# Patient Record
Sex: Male | Born: 1980 | Race: Black or African American | Hispanic: No | Marital: Single | Smoking: Former smoker
Health system: Southern US, Community
[De-identification: ages and names within clinical notes are randomized; demographics above are authoritative.]

## PROBLEM LIST (undated history)

## (undated) DIAGNOSIS — Z8719 Personal history of other diseases of the digestive system: Secondary | ICD-10-CM

## (undated) DIAGNOSIS — K219 Gastro-esophageal reflux disease without esophagitis: Secondary | ICD-10-CM

## (undated) DIAGNOSIS — I10 Essential (primary) hypertension: Secondary | ICD-10-CM

## (undated) DIAGNOSIS — E119 Type 2 diabetes mellitus without complications: Secondary | ICD-10-CM

## (undated) HISTORY — DX: Personal history of other diseases of the digestive system: Z87.19

## (undated) HISTORY — PX: TOOTH EXTRACTION: SUR596

---

## 2010-07-19 ENCOUNTER — Emergency Department: Payer: Self-pay | Admitting: Emergency Medicine

## 2010-07-21 ENCOUNTER — Emergency Department: Payer: Self-pay | Admitting: Emergency Medicine

## 2010-07-28 ENCOUNTER — Emergency Department: Payer: Self-pay | Admitting: Unknown Physician Specialty

## 2011-10-31 ENCOUNTER — Emergency Department: Payer: Self-pay | Admitting: Emergency Medicine

## 2011-11-29 ENCOUNTER — Emergency Department: Payer: Self-pay | Admitting: Emergency Medicine

## 2015-08-15 ENCOUNTER — Emergency Department
Admission: EM | Admit: 2015-08-15 | Discharge: 2015-08-15 | Disposition: A | Discharge status: Home / Self Care | Payer: Medicare Other | Attending: Emergency Medicine | Admitting: Emergency Medicine

## 2015-08-15 ENCOUNTER — Encounter: Payer: Self-pay | Admitting: Emergency Medicine

## 2015-08-15 DIAGNOSIS — E1165 Type 2 diabetes mellitus with hyperglycemia: Secondary | ICD-10-CM | POA: Diagnosis not present

## 2015-08-15 DIAGNOSIS — F17200 Nicotine dependence, unspecified, uncomplicated: Secondary | ICD-10-CM | POA: Diagnosis not present

## 2015-08-15 DIAGNOSIS — R739 Hyperglycemia, unspecified: Secondary | ICD-10-CM

## 2015-08-15 DIAGNOSIS — N529 Male erectile dysfunction, unspecified: Secondary | ICD-10-CM | POA: Insufficient documentation

## 2015-08-15 DIAGNOSIS — N528 Other male erectile dysfunction: Secondary | ICD-10-CM

## 2015-08-15 HISTORY — DX: Essential (primary) hypertension: I10

## 2015-08-15 HISTORY — DX: Type 2 diabetes mellitus without complications: E11.9

## 2015-08-15 LAB — HEPATIC FUNCTION PANEL
ALBUMIN: 4.2 g/dL (ref 3.5–5.0)
ALK PHOS: 71 U/L (ref 38–126)
ALT: 24 U/L (ref 17–63)
AST: 20 U/L (ref 15–41)
BILIRUBIN TOTAL: 1 mg/dL (ref 0.3–1.2)
Bilirubin, Direct: <0.1 mg/dL — ABNORMAL LOW (ref 0.1–0.5)
TOTAL PROTEIN: 7.6 g/dL (ref 6.5–8.1)

## 2015-08-15 LAB — CBC
HEMATOCRIT: 46.2 % (ref 40.0–52.0)
HEMOGLOBIN: 15.4 g/dL (ref 13.0–18.0)
MCH: 28.3 pg (ref 26.0–34.0)
MCHC: 33.4 g/dL (ref 32.0–36.0)
MCV: 85 fL (ref 80.0–100.0)
Platelets: 162 10*3/uL (ref 150–440)
RBC: 5.44 MIL/uL (ref 4.40–5.90)
RDW: 13 % (ref 11.5–14.5)
WBC: 4.7 10*3/uL (ref 3.8–10.6)

## 2015-08-15 LAB — BASIC METABOLIC PANEL
ANION GAP: 6 (ref 5–15)
BUN: 11 mg/dL (ref 6–20)
CHLORIDE: 103 mmol/L (ref 101–111)
CO2: 24 mmol/L (ref 22–32)
Calcium: 9.2 mg/dL (ref 8.9–10.3)
Creatinine, Ser: 0.81 mg/dL (ref 0.61–1.24)
GFR calc Af Amer: >60 mL/min (ref >60)
Glucose, Bld: 272 mg/dL — ABNORMAL HIGH (ref 65–99)
POTASSIUM: 4 mmol/L (ref 3.5–5.1)
SODIUM: 133 mmol/L — AB (ref 135–145)

## 2015-08-15 LAB — GLUCOSE, CAPILLARY
GLUCOSE-CAPILLARY: 225 mg/dL — AB (ref 65–99)
Glucose-Capillary: 279 mg/dL — ABNORMAL HIGH (ref 65–99)

## 2015-08-15 MED ORDER — METFORMIN HCL 1000 MG PO TABS: 1000.0000 mg | ORAL_TABLET | Freq: Two times a day (BID) | ORAL | Status: DC

## 2015-08-15 NOTE — ED Provider Notes (Signed)
[facility] Emergency Department Provider Note   ____________________________________________  Time seen: Approximately 3:15 PM I have reviewed the triage vital signs and the triage nursing note.  HISTORY  Chief Complaint Hyperglycemia   Historian Patient  HPI Steve [name] is a 35 y.o. male with a history of hypertension and diabetes, who has been out of his metformin for what sounds like weeks to months although he is a bit of a poor historian. He is here because over the weekend he felt generalized weakness, decreased motivation, some headaches, some mild nausea which she thinks might indeed elevated blood sugar. He also looks to his girlfriend who gives me the additional history that he had trouble with an erection this weekend, which is what actually spurred him to come to the ED for evaluation.  Patient states when he was diagnosed with diabetes he was over 350 pounds and had significant weight loss, and then he was incarcerated and states that his nutrition was slightly better and so that his diabetes came under control. He's been out of diabetes medications recently, and would like to get started back on that.  He's not had any pain with urination, discharge, prior STD, or known STD exposure. No bowel pain. patient diarrhea.    Past Medical History  Diagnosis Date  . Diabetes mellitus without complication ([place])   . Hypertension     There are no active problems to display for this patient.   History reviewed. No pertinent past surgical history.  Current Outpatient Rx  Name  Route  Sig  Dispense  Refill  . metFORMIN (GLUCOPHAGE) 1000 MG tablet   Oral   Take 1 tablet (1,000 mg total) by mouth 2 (two) times daily with a meal.   60 tablet   1     Allergies Review of patient's allergies indicates no known allergies.  No family history on file.  Social History Social History  Substance Use Topics  . Smoking status: Current Every Day  Smoker  . Smokeless tobacco: None  . Alcohol Use: No    Review of Systems  Constitutional: Negative for fever. Eyes: Negative for visual changes. ENT: Negative for sore throat. Cardiovascular: Negative for chest pain. Respiratory: Negative for shortness of breath. Gastrointestinal: Negative for abdominal pain, vomiting and diarrhea. Genitourinary: Negative for dysuria. Musculoskeletal: Negative for back pain. Skin: Negative for rash. Neurological: Negative for headache at present 10 point Review of Systems otherwise negative ____________________________________________   PHYSICAL EXAM:  VITAL SIGNS: ED Triage Vitals  Enc Vitals Group     BP 08/15/15 1232 123/96 mmHg     Pulse Rate 08/15/15 1232 86     Resp 08/15/15 1232 18     Temp 08/15/15 1232 98.2 F (36.8 C)     Temp Source 08/15/15 1232 Oral     SpO2 08/15/15 1232 96 %     Weight 08/15/15 1232 265 lb (120.203 kg)     Height 08/15/15 1232 6\' 1"  (1.854 m)     Head Cir --      Peak Flow --      Pain Score --      Pain Loc --      Pain Edu? --      Excl. in [place]? --      Constitutional: Alert and oriented. Well appearing and in no distress.  HEENT   Head: Normocephalic and atraumatic.      Eyes: Conjunctivae are normal. PERRL. Normal extraocular movements.  Ears:         Nose: No congestion/rhinnorhea.   Mouth/Throat: Mucous membranes are moist.   Neck: No stridor. Cardiovascular/Chest: Normal rate, regular rhythm.  No murmurs, rubs, or gallops. Respiratory: Normal respiratory effort without tachypnea nor retractions. Breath sounds are clear and equal bilaterally. No wheezes/rales/rhonchi. Gastrointestinal: Soft. No distention, no guarding, no rebound. Nontender.    Genitourinary/rectal:Deferred Musculoskeletal: Nontender with normal range of motion in all extremities. No joint effusions.  No lower extremity tenderness.  No edema. Neurologic:  Normal speech and language. No gross or focal  neurologic deficits are appreciated. Skin:  Skin is warm, dry and intact. No rash noted. Psychiatric: Mood and affect are normal. Speech and behavior are normal. Patient exhibits appropriate insight and judgment.  ____________________________________________   EKG I, Lisa Roca, MD, the attending physician have personally viewed and interpreted all ECGs.  None ____________________________________________  LABS (pertinent positives/negatives)  Basic metabolic panel significant for glucose 272 and sodium 133 White blood count 4.7, hemoglobin 15.4 and platelet count 162 Hepatic function panel and hemoglobin A1c were added on, for primary care follow-up  ____________________________________________  RADIOLOGY All Xrays were viewed by me. Imaging interpreted by Radiologist.  None __________________________________________  PROCEDURES  Procedure(s) performed: None  Critical Care performed: None  ____________________________________________   ED COURSE / ASSESSMENT AND PLAN  Pertinent labs & imaging results that were available during my care of the patient were reviewed by me and considered in my medical decision making (see chart for details).    Patient has some vague generalized symptoms, but is well appearing and essentially without symptoms today. He is quite anxious about his erectile dysfunction, but I discussed with him that this is not necessarily something that we treat emergency department. He should follow back up with his primary care physician. We discussed that poorly controlled diabetic's me experience neuropathy contributing to the erectile dysfunction. I started back on his metformin. No evidence of DKA today.    CONSULTATIONS:   None   Patient / Family / Caregiver informed of clinical course, medical decision-making process, and agree with plan.   I discussed return precautions, follow-up instructions, and discharged instructions with patient and/or  family.   ___________________________________________   FINAL CLINICAL IMPRESSION(S) / ED DIAGNOSES   Final diagnoses:  Hyperglycemia  Other male erectile dysfunction              Note: This dictation was prepared with Dragon dictation. Any transcriptional errors that result from this process are unintentional   Lisa Roca, MD 08/15/15 [phone]

## 2015-08-15 NOTE — ED Notes (Signed)
States he thinks he blood sugar is elevated.  Feeling bad  Wt loss

## 2015-08-15 NOTE — Discharge Instructions (Signed)
Return to the Emergency Room for any worsening condition such as chest pain, weakness or numbness, trouble breathing, vomiting, confusion or altered mental status, or any other symptoms that are concerning to you.   Hyperglycemia Hyperglycemia occurs when the glucose (sugar) in your blood is too high. Hyperglycemia can happen for many reasons, but it most often happens to people who do not know they have diabetes or are not managing their diabetes properly.  CAUSES  Whether you have diabetes or not, there are other causes of hyperglycemia. Hyperglycemia can occur when you have diabetes, but it can also occur in other situations that you might not be as aware of, such as: Diabetes  If you have diabetes and are having problems controlling your blood glucose, hyperglycemia could occur because of some of the following reasons:  Not following your meal plan.  Not taking your diabetes medications or not taking it properly.  Exercising less or doing less activity than you normally do.  Being sick. Pre-diabetes  This cannot be ignored. Before people develop Type 2 diabetes, they almost always have "pre-diabetes." This is when your blood glucose levels are higher than normal, but not yet high enough to be diagnosed as diabetes. Research has shown that some long-term damage to the body, especially the heart and circulatory system, may already be occurring during pre-diabetes. If you take action to manage your blood glucose when you have pre-diabetes, you may delay or prevent Type 2 diabetes from developing. Stress  If you have diabetes, you may be "diet" controlled or on oral medications or insulin to control your diabetes. However, you may find that your blood glucose is higher than usual in the hospital whether you have diabetes or not. This is often referred to as "stress hyperglycemia." Stress can elevate your blood glucose. This happens because of hormones put out by the body during times of  stress. If stress has been the cause of your high blood glucose, it can be followed regularly by your caregiver. That way he/she can make sure your hyperglycemia does not continue to get worse or progress to diabetes. Steroids  Steroids are medications that act on the infection fighting system (immune system) to block inflammation or infection. One side effect can be a rise in blood glucose. Most people can produce enough extra insulin to allow for this rise, but for those who cannot, steroids make blood glucose levels go even higher. It is not unusual for steroid treatments to "uncover" diabetes that is developing. It is not always possible to determine if the hyperglycemia will go away after the steroids are stopped. A special blood test called an A1c is sometimes done to determine if your blood glucose was elevated before the steroids were started. SYMPTOMS  Thirsty.  Frequent urination.  Dry mouth.  Blurred vision.  Tired or fatigue.  Weakness.  Sleepy.  Tingling in feet or leg. DIAGNOSIS  Diagnosis is made by monitoring blood glucose in one or all of the following ways:  A1c test. This is a chemical found in your blood.  Fingerstick blood glucose monitoring.  Laboratory results. TREATMENT  First, knowing the cause of the hyperglycemia is important before the hyperglycemia can be treated. Treatment may include, but is not be limited to:  Education.  Change or adjustment in medications.  Change or adjustment in meal plan.  Treatment for an illness, infection, etc.  More frequent blood glucose monitoring.  Change in exercise plan.  Decreasing or stopping steroids.  Lifestyle changes. HOME CARE  INSTRUCTIONS   Test your blood glucose as directed.  Exercise regularly. Your caregiver will give you instructions about exercise. Pre-diabetes or diabetes which comes on with stress is helped by exercising.  Eat wholesome, balanced meals. Eat often and at regular, fixed  times. Your caregiver or nutritionist will give you a meal plan to guide your sugar intake.  Being at an ideal weight is important. If needed, losing as little as 10 to 15 pounds may help improve blood glucose levels. SEEK MEDICAL CARE IF:   You have questions about medicine, activity, or diet.  You continue to have symptoms (problems such as increased thirst, urination, or weight gain). SEEK IMMEDIATE MEDICAL CARE IF:   You are vomiting or have diarrhea.  Your breath smells fruity.  You are breathing faster or slower.  You are very sleepy or incoherent.  You have numbness, tingling, or pain in your feet or hands.  You have chest pain.  Your symptoms get worse even though you have been following your caregiver's orders.  If you have any other questions or concerns.   This information is not intended to replace advice given to you by your health care provider. Make sure you discuss any questions you have with your health care provider.   Document Released: 11/13/2000 Document Revised: 08/12/2011 Document Reviewed: 01/24/2015 Elsevier Interactive Patient Education 2016 Else[employer]  Erectile Dysfunction Erectile dysfunction is the inability to get or sustain a good enough erection to have sexual intercourse. Erectile dysfunction may involve:  Inability to get an erection.  Lack of enough hardness to allow penetration.  Loss of the erection before sex is finished.  Premature ejaculation. CAUSES  Certain drugs, such as:  Pain relievers.  Antihistamines.  Antidepressants.  Blood pressure medicines.  Water pills (diuretics).  Ulcer medicines.  Muscle relaxants.  Illegal drugs.  Excessive drinking.  Psychological causes, such as:  Anxiety.  Depression.  Sadness.  Exhaustion.  Performance fear.  Stress.  Physical causes, such as:  Artery problems. This may include diabetes, smoking, liver disease, or atherosclerosis.  High blood  pressure.  Hormonal problems, such as low testosterone.  Obesity.  Nerve problems. This may include back or pelvic injuries, diabetes mellitus, multiple sclerosis, or Parkinson disease. SYMPTOMS  Inability to get an erection.  Lack of enough hardness to allow penetration.  Loss of the erection before sex is finished.  Premature ejaculation.  Normal erections at some times, but with frequent unsatisfactory episodes.  Orgasms that are not satisfactory in sensation or frequency.  Low sexual satisfaction in either partner because of erection problems.  A curved penis occurring with erection. The curve may cause pain or may be too curved to allow for intercourse.  Never having nighttime erections. DIAGNOSIS Your caregiver can often diagnose this condition by:  Performing a physical exam to find other diseases or specific problems with the penis.  Asking you detailed questions about the problem.  Performing blood tests to check for diabetes mellitus or to measure hormone levels.  Performing urine tests to find other underlying health conditions.  Performing an ultrasound exam to check for scarring.  Performing a test to check blood flow to the penis.  Doing a sleep study at home to measure nighttime erections. TREATMENT   You may be prescribed medicines by mouth.  You may be given medicine injections into the penis.  You may be prescribed a vacuum pump with a ring.  Penile implant surgery may be performed. You may receive:  An inflatable implant.  A semirigid implant.  Blood vessel surgery may be performed. HOME CARE INSTRUCTIONS  If you are prescribed oral medicine, you should take the medicine as prescribed. Do not increase the dosage without first discussing it with your physician.  If you are using self-injections, be careful to avoid any veins that are on the surface of the penis. Apply pressure to the injection site for 5 minutes.  If you are using a  vacuum pump, make sure you have read the instructions before using it. Discuss any questions with your physician before taking the pump home. SEEK MEDICAL CARE IF:  You experience pain that is not responsive to the pain medicine you have been prescribed.  You experience nausea or vomiting. SEEK IMMEDIATE MEDICAL CARE IF:   When taking oral or injectable medications, you experience an erection that lasts longer than 4 hours. If your physician is unavailable, go to the nearest emergency room for evaluation. An erection that lasts much longer than 4 hours can result in permanent damage to your penis.  You have pain that is severe.  You develop redness, severe pain, or severe swelling of your penis.  You have redness spreading up into your groin or lower abdomen.  You are unable to pass your urine.   This information is not intended to replace advice given to you by your health care provider. Make sure you discuss any questions you have with your health care provider.   Document Released: 05/17/2000 Document Revised: 01/20/2013 Document Reviewed: 10/22/2012 Elsevier Interactive Patient Education [employer].

## 2015-08-16 LAB — HEMOGLOBIN A1C: HEMOGLOBIN A1C: 9.6 % — AB (ref 4.0–6.0)

## 2015-11-20 ENCOUNTER — Emergency Department
Admission: EM | Admit: 2015-11-20 | Discharge: 2015-11-20 | Disposition: A | Discharge status: Home / Self Care | Payer: Medicare Other | Attending: Emergency Medicine | Admitting: Emergency Medicine

## 2015-11-20 ENCOUNTER — Encounter: Payer: Self-pay | Admitting: Emergency Medicine

## 2015-11-20 ENCOUNTER — Emergency Department: Payer: Medicare Other

## 2015-11-20 DIAGNOSIS — E119 Type 2 diabetes mellitus without complications: Secondary | ICD-10-CM | POA: Diagnosis not present

## 2015-11-20 DIAGNOSIS — S199XXA Unspecified injury of neck, initial encounter: Secondary | ICD-10-CM | POA: Diagnosis present

## 2015-11-20 DIAGNOSIS — Y939 Activity, unspecified: Secondary | ICD-10-CM | POA: Diagnosis not present

## 2015-11-20 DIAGNOSIS — Z7984 Long term (current) use of oral hypoglycemic drugs: Secondary | ICD-10-CM | POA: Insufficient documentation

## 2015-11-20 DIAGNOSIS — F17200 Nicotine dependence, unspecified, uncomplicated: Secondary | ICD-10-CM | POA: Diagnosis not present

## 2015-11-20 DIAGNOSIS — S46912A Strain of unspecified muscle, fascia and tendon at shoulder and upper arm level, left arm, initial encounter: Secondary | ICD-10-CM | POA: Diagnosis not present

## 2015-11-20 DIAGNOSIS — F1290 Cannabis use, unspecified, uncomplicated: Secondary | ICD-10-CM | POA: Diagnosis not present

## 2015-11-20 DIAGNOSIS — S161XXA Strain of muscle, fascia and tendon at neck level, initial encounter: Secondary | ICD-10-CM | POA: Diagnosis not present

## 2015-11-20 DIAGNOSIS — Y999 Unspecified external cause status: Secondary | ICD-10-CM | POA: Insufficient documentation

## 2015-11-20 DIAGNOSIS — I10 Essential (primary) hypertension: Secondary | ICD-10-CM | POA: Insufficient documentation

## 2015-11-20 DIAGNOSIS — Y92410 Unspecified street and highway as the place of occurrence of the external cause: Secondary | ICD-10-CM | POA: Insufficient documentation

## 2015-11-20 DIAGNOSIS — S4992XA Unspecified injury of left shoulder and upper arm, initial encounter: Secondary | ICD-10-CM | POA: Diagnosis not present

## 2015-11-20 DIAGNOSIS — M25512 Pain in left shoulder: Secondary | ICD-10-CM | POA: Diagnosis not present

## 2015-11-20 MED ORDER — NAPROXEN 500 MG PO TBEC: 500.0000 mg | DELAYED_RELEASE_TABLET | Freq: Two times a day (BID) | ORAL | Status: DC

## 2015-11-20 MED ORDER — CYCLOBENZAPRINE HCL 5 MG PO TABS: 5.0000 mg | ORAL_TABLET | Freq: Three times a day (TID) | ORAL | Status: DC | PRN

## 2015-11-20 MED ORDER — HYDROCODONE-ACETAMINOPHEN 5-325 MG PO TABS: 1.0000 | ORAL_TABLET | Freq: Four times a day (QID) | ORAL | Status: DC | PRN

## 2015-11-20 MED ORDER — HYDROCODONE-ACETAMINOPHEN 5-325 MG PO TABS
1.0000 | ORAL_TABLET | Freq: Once | ORAL | Status: AC
  Administered 2015-11-20: 1 via ORAL
  Filled 2015-11-20: qty 1

## 2015-11-20 MED ORDER — CYCLOBENZAPRINE HCL 10 MG PO TABS
5.0000 mg | ORAL_TABLET | Freq: Once | ORAL | Status: AC
  Administered 2015-11-20: 5 mg via ORAL
  Filled 2015-11-20: qty 1
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
## 2015-11-21 DIAGNOSIS — F17210 Nicotine dependence, cigarettes, uncomplicated: Secondary | ICD-10-CM | POA: Diagnosis not present

## 2015-11-21 DIAGNOSIS — Z7984 Long term (current) use of oral hypoglycemic drugs: Secondary | ICD-10-CM | POA: Diagnosis not present

## 2015-11-21 DIAGNOSIS — E119 Type 2 diabetes mellitus without complications: Secondary | ICD-10-CM | POA: Diagnosis not present

## 2015-11-21 DIAGNOSIS — S060X0A Concussion without loss of consciousness, initial encounter: Secondary | ICD-10-CM | POA: Diagnosis not present

## 2015-11-21 DIAGNOSIS — S060X9A Concussion with loss of consciousness of unspecified duration, initial encounter: Secondary | ICD-10-CM | POA: Diagnosis not present

## 2015-11-21 DIAGNOSIS — S161XXA Strain of muscle, fascia and tendon at neck level, initial encounter: Secondary | ICD-10-CM | POA: Diagnosis not present

## 2015-11-23 DIAGNOSIS — S060X0A Concussion without loss of consciousness, initial encounter: Secondary | ICD-10-CM | POA: Diagnosis not present

## 2015-11-23 DIAGNOSIS — R51 Headache: Secondary | ICD-10-CM | POA: Diagnosis not present

## 2016-01-30 DIAGNOSIS — S139XXA Sprain of joints and ligaments of unspecified parts of neck, initial encounter: Secondary | ICD-10-CM | POA: Diagnosis not present

## 2016-01-30 DIAGNOSIS — S3991XA Unspecified injury of abdomen, initial encounter: Secondary | ICD-10-CM | POA: Diagnosis not present

## 2016-01-30 DIAGNOSIS — S238XXA Sprain of other specified parts of thorax, initial encounter: Secondary | ICD-10-CM | POA: Diagnosis not present

## 2016-01-30 DIAGNOSIS — K5900 Constipation, unspecified: Secondary | ICD-10-CM | POA: Diagnosis not present

## 2016-09-01 DIAGNOSIS — Z8719 Personal history of other diseases of the digestive system: Secondary | ICD-10-CM

## 2016-09-01 HISTORY — DX: Personal history of other diseases of the digestive system: Z87.19

## 2016-09-05 ENCOUNTER — Emergency Department
Admission: EM | Admit: 2016-09-05 | Discharge: 2016-09-05 | Disposition: A | Discharge status: Home / Self Care | Payer: Medicare Other | Attending: Emergency Medicine | Admitting: Emergency Medicine

## 2016-09-05 ENCOUNTER — Encounter: Payer: Self-pay | Admitting: Emergency Medicine

## 2016-09-05 DIAGNOSIS — K2970 Gastritis, unspecified, without bleeding: Secondary | ICD-10-CM | POA: Insufficient documentation

## 2016-09-05 DIAGNOSIS — I10 Essential (primary) hypertension: Secondary | ICD-10-CM | POA: Diagnosis not present

## 2016-09-05 DIAGNOSIS — F17210 Nicotine dependence, cigarettes, uncomplicated: Secondary | ICD-10-CM | POA: Insufficient documentation

## 2016-09-05 DIAGNOSIS — E119 Type 2 diabetes mellitus without complications: Secondary | ICD-10-CM | POA: Diagnosis not present

## 2016-09-05 DIAGNOSIS — K2900 Acute gastritis without bleeding: Secondary | ICD-10-CM | POA: Diagnosis not present

## 2016-09-05 DIAGNOSIS — R1013 Epigastric pain: Secondary | ICD-10-CM | POA: Diagnosis present

## 2016-09-05 DIAGNOSIS — Z7984 Long term (current) use of oral hypoglycemic drugs: Secondary | ICD-10-CM | POA: Diagnosis not present

## 2016-09-05 LAB — CBC
HCT: 45.1 % (ref 40.0–52.0)
Hemoglobin: 15.1 g/dL (ref 13.0–18.0)
MCH: 28.4 pg (ref 26.0–34.0)
MCHC: 33.5 g/dL (ref 32.0–36.0)
MCV: 84.9 fL (ref 80.0–100.0)
PLATELETS: 158 10*3/uL (ref 150–440)
RBC: 5.32 MIL/uL (ref 4.40–5.90)
RDW: 13 % (ref 11.5–14.5)
WBC: 4.5 10*3/uL (ref 3.8–10.6)

## 2016-09-05 LAB — COMPREHENSIVE METABOLIC PANEL
ALBUMIN: 4.2 g/dL (ref 3.5–5.0)
ALK PHOS: 63 U/L (ref 38–126)
ALT: 26 U/L (ref 17–63)
AST: 28 U/L (ref 15–41)
Anion gap: 9 (ref 5–15)
BUN: 10 mg/dL (ref 6–20)
CALCIUM: 9.4 mg/dL (ref 8.9–10.3)
CO2: 20 mmol/L — ABNORMAL LOW (ref 22–32)
CREATININE: 0.84 mg/dL (ref 0.61–1.24)
Chloride: 106 mmol/L (ref 101–111)
Glucose, Bld: 310 mg/dL — ABNORMAL HIGH (ref 65–99)
Potassium: 4.6 mmol/L (ref 3.5–5.1)
SODIUM: 135 mmol/L (ref 135–145)
Total Bilirubin: 1.3 mg/dL — ABNORMAL HIGH (ref 0.3–1.2)
Total Protein: 7.2 g/dL (ref 6.5–8.1)

## 2016-09-05 LAB — URINALYSIS, COMPLETE (UACMP) WITH MICROSCOPIC
Bacteria, UA: NONE SEEN
Bilirubin Urine: NEGATIVE
Hgb urine dipstick: NEGATIVE
KETONES UR: NEGATIVE mg/dL
LEUKOCYTES UA: NEGATIVE
Nitrite: NEGATIVE
PROTEIN: NEGATIVE mg/dL
Specific Gravity, Urine: 1.035 — ABNORMAL HIGH (ref 1.005–1.030)
Squamous Epithelial / LPF: NONE SEEN
WBC, UA: NONE SEEN WBC/hpf (ref 0–5)
pH: 6 (ref 5.0–8.0)

## 2016-09-05 LAB — LIPASE, BLOOD: Lipase: 82 U/L — ABNORMAL HIGH (ref 11–51)

## 2016-09-05 LAB — TROPONIN I: Troponin I: <0.03 ng/mL (ref <0.03)

## 2016-09-05 MED ORDER — GI COCKTAIL ~~LOC~~
30.0000 mL | Freq: Once | ORAL | Status: AC
  Administered 2016-09-05: 30 mL via ORAL
  Filled 2016-09-05: qty 30

## 2016-09-05 MED ORDER — PANTOPRAZOLE SODIUM 20 MG PO TBEC: 20.0000 mg | DELAYED_RELEASE_TABLET | Freq: Every day | ORAL | 1 refills | Status: DC

## 2016-09-05 NOTE — ED Notes (Signed)
Dr kinner at bedside. 

## 2016-09-05 NOTE — ED Triage Notes (Signed)
Pt c/o pain across upper entire abdomen, not worse on one side for past month intermittent. Denies NVD. Pain worse after eating. "really uncomfortable".

## 2016-09-05 NOTE — ED Provider Notes (Signed)
[facility] Emergency Department Provider Note   ____________________________________________    I have reviewed the triage vital signs and the nursing notes.   HISTORY  Chief Complaint Abdominal Pain     HPI Steve [name] is a 36 y.o. male who presents with complaints of burning intermittent abdominal pain, belching and discomfort over the last 2 months.he denies nausea or vomiting. He reports he has been taking Pepto-Bismol which helped significantly. He does not drink alcohol. He does smoke cigarettes. No history of gastritis or PUD in the past. Normal stools.   Past Medical History:  Diagnosis Date  . Diabetes mellitus without complication ([place])   . Hypertension     There are no active problems to display for this patient.   History reviewed. No pertinent surgical history.  Prior to Admission medications   Medication Sig Start Date End Date Taking? Authorizing Provider  metFORMIN (GLUCOPHAGE) 1000 MG tablet Take 1 tablet (1,000 mg total) by mouth 2 (two) times daily with a meal. 08/15/15  Yes Lisa Roca, MD  cyclobenzaprine (FLEXERIL) 5 MG tablet Take 1 tablet (5 mg total) by mouth 3 (three) times daily as needed for muscle spasms. Patient not taking: Reported on 09/05/2016 11/20/15   Dannielle Karvonen Menshew, PA-C  HYDROcodone-acetaminophen (NORCO) 5-325 MG tablet Take 1 tablet by mouth every 6 (six) hours as needed for moderate pain. Patient not taking: Reported on 09/05/2016 11/20/15   Dannielle Karvonen Menshew, PA-C  naproxen (EC NAPROSYN) 500 MG EC tablet Take 1 tablet (500 mg total) by mouth 2 (two) times daily with a meal. Patient not taking: Reported on 09/05/2016 11/20/15   Alita Chyle Bacon Menshew, PA-C  pantoprazole (PROTONIX) 20 MG tablet Take 1 tablet (20 mg total) by mouth daily. 09/05/16 09/05/17  Lavonia Drafts, MD     Allergies Patient has no known allergies.  History reviewed. No pertinent family history.  Social History Social  History  Substance Use Topics  . Smoking status: Current Every Day Smoker  . Smokeless tobacco: Never Used  . Alcohol use No    Review of Systems  Constitutional: No fever/chills   Cardiovascular: Denies chest pain. Respiratory: Denies shortness of breath. Gastrointestinal: as above Genitourinary: Negative for dysuria. Musculoskeletal: Negative for back pain. Skin: Negative for rash. Neurological: Negative for headaches  10-point ROS otherwise negative.  ____________________________________________   PHYSICAL EXAM:  VITAL SIGNS: ED Triage Vitals  Enc Vitals Group     BP 09/05/16 0915 106/79     Pulse Rate 09/05/16 0915 72     Resp 09/05/16 0915 20     Temp 09/05/16 0915 98 F (36.7 C)     Temp Source 09/05/16 0915 Oral     SpO2 09/05/16 0915 99 %     Weight 09/05/16 0916 260 lb (117.9 kg)     Height 09/05/16 0916 6\' 1"  (1.854 m)     Head Circumference --      Peak Flow --      Pain Score 09/05/16 0924 8     Pain Loc --      Pain Edu? --      Excl. in [place]? --     Constitutional: Alert and oriented. No acute distress. Pleasant and interactive. Patient with albinism Eyes: Conjunctivae are normal.    Mouth/Throat: Mucous membranes are moist.    Cardiovascular: Normal rate, regular rhythm. Grossly normal heart sounds.  Good peripheral circulation. Respiratory: Normal respiratory effort.  No retractions. Lungs CTAB. Gastrointestinal: no  abdominal tenderness to palpation. No distention.  No CVA tenderness. Genitourinary: deferred Musculoskeletal:   Warm and well perfused Neurologic:  Normal speech and language. No gross focal neurologic deficits are appreciated.  Skin:  Skin is warm, dry and intact. No rash noted. Psychiatric: Mood and affect are normal. Speech and behavior are normal.  ____________________________________________   LABS (all labs ordered are listed, but only abnormal results are displayed)  Labs Reviewed  LIPASE, BLOOD - Abnormal; Notable  for the following:       Result Value   Lipase 82 (*)    All other components within normal limits  COMPREHENSIVE METABOLIC PANEL - Abnormal; Notable for the following:    CO2 20 (*)    Glucose, Bld 310 (*)    Total Bilirubin 1.3 (*)    All other components within normal limits  URINALYSIS, COMPLETE (UACMP) WITH MICROSCOPIC - Abnormal; Notable for the following:    Color, Urine YELLOW (*)    APPearance CLEAR (*)    Specific Gravity, Urine 1.035 (*)    Glucose, UA >=500 (*)    All other components within normal limits  CBC  TROPONIN I   ____________________________________________  EKG  ED ECG REPORT I, Lavonia Drafts, the attending physician, personally viewed and interpreted this ECG.  Date: 09/05/2016 EKG Time: 9:30 AM Rate: 66 Rhythm: normal sinus rhythm QRS Axis: normal Intervals: normal ST/T Wave abnormalities: normal Conduction Disturbances: none Narrative Interpretation: unremarkable  ____________________________________________  RADIOLOGY  None ____________________________________________   PROCEDURES  Procedure(s) performed: No    Critical Care performed: No ____________________________________________   INITIAL IMPRESSION / ASSESSMENT AND PLAN / ED COURSE  Pertinent labs & imaging results that were available during my care of the patient were reviewed by me and considered in my medical decision making (see chart for details).  Patient with prolonged intermittent symptoms most consistent with gastritis/PUD. mild elevation of lipase however no tenderness to palpation. so do not suspect pancreatitis.No nausea/vomiting. We will treat with GI cocktail, start the patient on PPI and refer the patient to gastroenterology.    ____________________________________________   FINAL CLINICAL IMPRESSION(S) / ED DIAGNOSES  Final diagnoses:  Gastritis without bleeding, unspecified chronicity, unspecified gastritis type      NEW MEDICATIONS STARTED  DURING THIS VISIT:  Discharge Medication List as of 09/05/2016 11:38 AM    START taking these medications   Details  pantoprazole (PROTONIX) 20 MG tablet Take 1 tablet (20 mg total) by mouth daily., Starting Thu 09/05/2016, Until Fri 09/05/2017, Print         Note:  This document was prepared using Dragon voice recognition software and may include unintentional dictation errors.    Lavonia Drafts, MD 09/05/16 [phone]

## 2016-09-05 NOTE — ED Notes (Signed)
Called pharmacy for GI cocktial

## 2016-11-14 ENCOUNTER — Ambulatory Visit (INDEPENDENT_AMBULATORY_CARE_PROVIDER_SITE_OTHER): Payer: Medicare Other | Admitting: Family Medicine

## 2016-11-14 ENCOUNTER — Encounter: Payer: Self-pay | Admitting: Family Medicine

## 2016-11-14 VITALS — BP 131/85 | HR 80 | Temp 98.3°F | Ht 72.75 in | Wt 254.0 lb

## 2016-11-14 DIAGNOSIS — E1165 Type 2 diabetes mellitus with hyperglycemia: Secondary | ICD-10-CM

## 2016-11-14 DIAGNOSIS — Z7689 Persons encountering health services in other specified circumstances: Secondary | ICD-10-CM

## 2016-11-14 DIAGNOSIS — Z720 Tobacco use: Secondary | ICD-10-CM | POA: Insufficient documentation

## 2016-11-14 DIAGNOSIS — E119 Type 2 diabetes mellitus without complications: Secondary | ICD-10-CM | POA: Diagnosis not present

## 2016-11-14 DIAGNOSIS — R1013 Epigastric pain: Secondary | ICD-10-CM

## 2016-11-14 MED ORDER — PANTOPRAZOLE SODIUM 40 MG PO TBEC: 40.0000 mg | DELAYED_RELEASE_TABLET | Freq: Every day | ORAL | 3 refills | Status: DC

## 2016-11-14 MED ORDER — METFORMIN HCL 1000 MG PO TABS: 1000.0000 mg | ORAL_TABLET | Freq: Every day | ORAL | 3 refills | Status: DC

## 2016-11-14 NOTE — Progress Notes (Signed)
BP 131/85   Pulse 80   Temp 98.3 F (36.8 C)   Ht 6' 0.75" (1.848 m)   Wt 254 lb (115.2 kg)   SpO2 98%   BMI 33.74 kg/m    Subjective:    Patient ID: Steve [name], male    DOB: 04-20-1981, 36 y.o.   MRN: [id number]  HPI: Steve [name] is a 36 y.o. male  Chief Complaint  Patient presents with  . Establish Care  . Headache    x 1-2 weeks, Tylenol helps.   Steve [name]    was diagnosed with gastritis in April. Still has alot of gas.  . Diabetes    hasn't been checked in a while   Patient presents today to establish care. Has several concerns today.   Hx of DM, was prescribed 1000 mg metformin BID but states he has side effects (ED, GI upset, etc) when taking both doses so taking once daily. Has lost about 90 lb since diagnosis several years ago. Does still drink lots of sodas, but has improved his diet quite a bit. Does not check sugars at home. C/o frequent urination but no other sxs. No hypoglycemic episodes. Last A1C was 08/2015 and was 9.6 - this was off the metformin.   Was seen in ER 2 months ago for epigastric burning and bloating. Given protonix for gastritis but did not pick it up. Still having some sxs, but not quite as bad. Pepto bismol helps a lot. Admits to smoking at least a pack per day and drinking lots of sodas. No frequent NSAID use. No hematemesis, fever, vomiting, diarrhea, anorexia.   Also having almost 2 weeks of headaches which is out of the ordinary for him. When asked, does admit recent URI that has since resolved. Tylenol does help. No associated sxs with these headaches, including visual changes, confusion, weakness.   Past Medical History:  Diagnosis Date  . Diabetes mellitus without complication ([place])   . History of gastritis 09/2016  . Hypertension    Social History   Social History  . Marital status: Single    Spouse name: N/A  . Number of children: N/A  . Years of education: N/A   Occupational History  . Not on file.   Social History  Main Topics  . Smoking status: Current Every Day Smoker    Packs/day: 1.00    Types: Cigarettes  . Smokeless tobacco: Never Used  . Alcohol use No  . Drug use: Yes    Types: Marijuana  . Sexual activity: Not on file   Other Topics Concern  . Not on file   Social History Narrative  . No narrative on file   Relevant past medical, surgical, family and social history reviewed and updated as indicated. Interim medical history since our last visit reviewed. Allergies and medications reviewed and updated.  Review of Systems  Constitutional: Negative.   HENT: Negative.   Eyes: Negative.   Respiratory: Negative.   Cardiovascular: Negative.   Gastrointestinal: Positive for abdominal pain.  Genitourinary: Negative.   Musculoskeletal: Negative.   Neurological: Positive for headaches.  Psychiatric/Behavioral: Negative.    Per HPI unless specifically indicated above     Objective:    BP 131/85   Pulse 80   Temp 98.3 F (36.8 C)   Ht 6' 0.75" (1.848 m)   Wt 254 lb (115.2 kg)   SpO2 98%   BMI 33.74 kg/m   Wt Readings from Last 3 Encounters:  11/14/16 254 lb (115.2 kg)  09/05/16 260 lb (117.9 kg)  11/20/15 265 lb (120.2 kg)    Physical Exam  Constitutional: He is oriented to person, place, and time. He appears well-developed and well-nourished.  HENT:  Head: Atraumatic.  Eyes: Pupils are equal, round, and reactive to light.  Neck: Normal range of motion. Neck supple.  Cardiovascular: Normal rate and normal heart sounds.   Pulmonary/Chest: Effort normal and breath sounds normal. No respiratory distress.  Abdominal: Soft. Bowel sounds are normal. He exhibits no distension. There is no tenderness.  Musculoskeletal: Normal range of motion.  Neurological: He is alert and oriented to person, place, and time.  Skin: Skin is warm and dry. No rash noted.  Psychiatric: He has a normal mood and affect. His behavior is normal.  Nursing note and vitals reviewed.     Assessment &  Plan:   Problem List Items Addressed This Visit      Endocrine   Type 2 diabetes mellitus with hyperglycemia, without long-term current use of insulin ([place])    Await labs, continue metformin in meantime. Discussed lifestyle modifications - particularly cutting back or off of soft drinks.       Relevant Medications   metFORMIN (GLUCOPHAGE) 1000 MG tablet   Other Relevant Orders   Comprehensive metabolic panel (Completed)   HgB A1c (Completed)     Other   Tobacco use    Long discussion about cessation, pt looking to quit hopefully in near future but not ready just yet. Discussed free classes at [facility] and recommended starting with patches/gum and behavioral modifications to break the habitual aspects of smoking.        Other Visit Diagnoses    Encounter to establish care    -  Primary   Will set up a CPE when he comes back for 3 month DM f/u   Epigastric pain       Agree with ER eval regarding gastritis. Will check for H pylori today. Start protonix in meantime. Lifestyle modifications discussed.    Relevant Orders   Helicobacter pylori abs-IgG+IgA, bld (Completed)       Follow up plan: Return in about 3 months (around 02/14/2017) for A1C, CPE.

## 2016-11-16 DIAGNOSIS — E1165 Type 2 diabetes mellitus with hyperglycemia: Secondary | ICD-10-CM | POA: Insufficient documentation

## 2016-11-16 LAB — COMPREHENSIVE METABOLIC PANEL
ALBUMIN: 4.7 g/dL (ref 3.5–5.5)
ALT: 35 IU/L (ref 0–44)
AST: 15 IU/L (ref 0–40)
Albumin/Globulin Ratio: 1.8 (ref 1.2–2.2)
Alkaline Phosphatase: 85 IU/L (ref 39–117)
BUN / CREAT RATIO: 13 (ref 9–20)
BUN: 10 mg/dL (ref 6–20)
Bilirubin Total: 0.7 mg/dL (ref 0.0–1.2)
CALCIUM: 10 mg/dL (ref 8.7–10.2)
CO2: 19 mmol/L — ABNORMAL LOW (ref 20–29)
Chloride: 98 mmol/L (ref 96–106)
Creatinine, Ser: 0.8 mg/dL (ref 0.76–1.27)
GFR, EST AFRICAN AMERICAN: 134 mL/min/{1.73_m2} (ref >59)
GFR, EST NON AFRICAN AMERICAN: 116 mL/min/{1.73_m2} (ref >59)
GLUCOSE: 326 mg/dL — AB (ref 65–99)
Globulin, Total: 2.6 g/dL (ref 1.5–4.5)
Potassium: 4.4 mmol/L (ref 3.5–5.2)
Sodium: 137 mmol/L (ref 134–144)
TOTAL PROTEIN: 7.3 g/dL (ref 6.0–8.5)

## 2016-11-16 LAB — HELICOBACTER PYLORI ABS-IGG+IGA, BLD

## 2016-11-16 LAB — HEMOGLOBIN A1C
Est. average glucose Bld gHb Est-mCnc: 298 mg/dL
Hgb A1c MFr Bld: 12 % — ABNORMAL HIGH (ref 4.8–5.6)

## 2016-11-16 NOTE — Assessment & Plan Note (Signed)
Long discussion about cessation, pt looking to quit hopefully in near future but not ready just yet. Discussed free classes at [facility] and recommended starting with patches/gum and behavioral modifications to break the habitual aspects of smoking.

## 2016-11-16 NOTE — Assessment & Plan Note (Signed)
Await labs, continue metformin in meantime. Discussed lifestyle modifications - particularly cutting back or off of soft drinks.

## 2016-11-18 ENCOUNTER — Telehealth: Payer: Self-pay | Admitting: Family Medicine

## 2016-11-18 MED ORDER — CANAGLIFLOZIN 100 MG PO TABS: 100.0000 mg | ORAL_TABLET | Freq: Every day | ORAL | 2 refills | Status: DC

## 2016-11-18 NOTE — Telephone Encounter (Signed)
Patient notified

## 2016-11-18 NOTE — Telephone Encounter (Signed)
Please call pt and let him know that his A1C came back much higher than before at 12 and we want it below 7 so we will need to add some medicine. I am sending over a new medicine called invokana for him to start and we will recheck when he comes back in 3 months. Continue working on reducing sodas and simple carbs from diet.   Also H pylori came back negative so just continue protonix and let us know if no improvement

## 2017-02-10 ENCOUNTER — Encounter: Payer: Medicare Other | Admitting: Family Medicine

## 2017-02-10 DIAGNOSIS — I152 Hypertension secondary to endocrine disorders: Secondary | ICD-10-CM | POA: Insufficient documentation

## 2017-02-10 DIAGNOSIS — I10 Essential (primary) hypertension: Secondary | ICD-10-CM | POA: Insufficient documentation

## 2017-02-10 DIAGNOSIS — E1159 Type 2 diabetes mellitus with other circulatory complications: Secondary | ICD-10-CM | POA: Insufficient documentation

## 2017-02-12 ENCOUNTER — Encounter: Payer: Medicare Other | Admitting: Family Medicine

## 2017-11-19 LAB — HM DIABETES EYE EXAM

## 2017-12-11 ENCOUNTER — Encounter: Payer: Medicare Other | Admitting: Family Medicine

## 2017-12-11 ENCOUNTER — Ambulatory Visit: Payer: Medicare Other

## 2017-12-29 ENCOUNTER — Ambulatory Visit: Payer: Medicare Other

## 2018-02-19 ENCOUNTER — Encounter: Payer: Self-pay | Admitting: Family Medicine

## 2018-08-05 ENCOUNTER — Ambulatory Visit: Payer: Medicare Other

## 2018-08-05 ENCOUNTER — Ambulatory Visit: Payer: Medicare Other | Admitting: Family Medicine

## 2018-08-06 ENCOUNTER — Ambulatory Visit (INDEPENDENT_AMBULATORY_CARE_PROVIDER_SITE_OTHER): Payer: Medicare Other | Admitting: Family Medicine

## 2018-08-06 ENCOUNTER — Encounter: Payer: Self-pay | Admitting: Family Medicine

## 2018-08-06 ENCOUNTER — Other Ambulatory Visit: Payer: Self-pay

## 2018-08-06 VITALS — BP 121/78 | HR 82 | Temp 98.4°F | Ht 74.0 in | Wt 244.0 lb

## 2018-08-06 DIAGNOSIS — N529 Male erectile dysfunction, unspecified: Secondary | ICD-10-CM | POA: Diagnosis not present

## 2018-08-06 DIAGNOSIS — E1165 Type 2 diabetes mellitus with hyperglycemia: Secondary | ICD-10-CM | POA: Diagnosis not present

## 2018-08-06 DIAGNOSIS — K5900 Constipation, unspecified: Secondary | ICD-10-CM

## 2018-08-06 MED ORDER — SILDENAFIL CITRATE 100 MG PO TABS: 50.0000 mg | ORAL_TABLET | Freq: Every day | ORAL | 2 refills | Status: DC | PRN

## 2018-08-06 MED ORDER — DULAGLUTIDE 0.75 MG/0.5ML ~~LOC~~ SOAJ: 0.7500 mg | SUBCUTANEOUS | 3 refills | Status: DC

## 2018-08-06 NOTE — Progress Notes (Signed)
BP 121/78   Pulse 82   Temp 98.4 F (36.9 C) (Oral)   Ht 6\' 2"  (1.88 m)   Wt 244 lb (110.7 kg)   SpO2 99%   BMI 31.33 kg/m    Subjective:    Patient ID: Steve [name], male    DOB: 1980-08-11, 38 y.o.   MRN: [id number]  HPI: Steve [name] is a 38 y.o. male  Chief Complaint  Patient presents with  . Abdominal Pain    patient concerned about nort having a normal bowel movement, less than once aweek bowel movement   Here today with abdominal pain from constipation the past few months. Used to go about every day or every other day. Now going about once weekly and stools are hard. Taking several laxatives to help which does seem to help after about a day. Eats mostly fast food. Does not regularly take stool softeners and has never taken fiber or probiotics. No known hemorrhoids, bloody stools, nausea or vomiting.   Dealing with ED for about a year. Able to achieve and maintain erections, but states they are at about 70% each time. No major new stressors in that time frame. Does have a known hx of poorly controlled diabetes and a past history of HTN.   Has been lost to f/u for almost 2 years for treatment of his significant DM. Has not been on medication in that time. Last A1C in 2018 was at 12.0. States he changed his diet some and lost a few lb and assumed he had treated his diabetes adequately.   Relevant past medical, surgical, family and social history reviewed and updated as indicated. Interim medical history since our last visit reviewed. Allergies and medications reviewed and updated.  Review of Systems  Per HPI unless specifically indicated above     Objective:    BP 121/78   Pulse 82   Temp 98.4 F (36.9 C) (Oral)   Ht 6\' 2"  (1.88 m)   Wt 244 lb (110.7 kg)   SpO2 99%   BMI 31.33 kg/m   Wt Readings from Last 3 Encounters:  08/06/18 244 lb (110.7 kg)  11/14/16 254 lb (115.2 kg)  09/05/16 260 lb (117.9 kg)    Physical Exam Vitals signs and nursing note  reviewed.  Constitutional:      Appearance: Normal appearance.  HENT:     Head: Atraumatic.  Eyes:     Extraocular Movements: Extraocular movements intact.     Conjunctiva/sclera: Conjunctivae normal.  Neck:     Musculoskeletal: Normal range of motion and neck supple.  Cardiovascular:     Rate and Rhythm: Normal rate and regular rhythm.  Pulmonary:     Effort: Pulmonary effort is normal.     Breath sounds: Normal breath sounds.  Abdominal:     General: Bowel sounds are normal.     Palpations: Abdomen is soft.     Tenderness: There is no abdominal tenderness. There is no guarding or rebound.  Musculoskeletal: Normal range of motion.  Skin:    General: Skin is warm and dry.  Neurological:     General: No focal deficit present.     Mental Status: He is oriented to person, place, and time.  Psychiatric:        Mood and Affect: Mood normal.        Thought Content: Thought content normal.        Judgment: Judgment normal.    Results for orders placed or performed in visit on  08/06/18  HgB A1c  Result Value Ref Range   Hgb A1c MFr Bld 10.7 (H) 4.8 - 5.6 %   Est. average glucose Bld gHb Est-mCnc 260 mg/dL  Comprehensive metabolic panel  Result Value Ref Range   Glucose 356 (H) 65 - 99 mg/dL   BUN 8 6 - 20 mg/dL   Creatinine, Ser 0.63 (L) 0.76 - 1.27 mg/dL   GFR calc non Af Amer 126 >59 mL/min/1.73   GFR calc Af Amer 146 >59 mL/min/1.73   BUN/Creatinine Ratio 13 9 - 20   Sodium 138 134 - 144 mmol/L   Potassium 4.7 3.5 - 5.2 mmol/L   Chloride 102 96 - 106 mmol/L   CO2 18 (L) 20 - 29 mmol/L   Calcium 9.7 8.7 - 10.2 mg/dL   Total Protein 6.8 6.0 - 8.5 g/dL   Albumin 4.5 4.0 - 5.0 g/dL   Globulin, Total 2.3 1.5 - 4.5 g/dL   Albumin/Globulin Ratio 2.0 1.2 - 2.2   Bilirubin Total 0.6 0.0 - 1.2 mg/dL   Alkaline Phosphatase 96 39 - 117 IU/L   AST 10 0 - 40 IU/L   ALT 20 0 - 44 IU/L      Assessment & Plan:   Problem List Items Addressed This Visit      Endocrine   Type 2  diabetes mellitus with hyperglycemia, without long-term current use of insulin ([place]) - Primary    Discussed importance of compliance with medications and follow ups and long term effects of severely uncontrolled blood sugars. Will restart metformin, add trulicity and recheck W4M.       Relevant Medications   Dulaglutide (TRULICITY) 6.28 MN/8.1RR SOPN   Other Relevant Orders   HgB A1c (Completed)   Comprehensive metabolic panel (Completed)    Other Visit Diagnoses    Erectile dysfunction, unspecified erectile dysfunction type       Discussed likely related to uncontrolled DM. Viagra sent for prn use but discussed importance of good DM control    Constipation, unspecified constipation type       Start miralax prn, add daily fiber supplement and eat more fruits and veggies. F/u if not improving       Follow up plan: Return in about 3 months (around 11/06/2018) for DM.

## 2018-08-06 NOTE — Patient Instructions (Addendum)
Miralax for stool softening  Mucinex for chest congestion

## 2018-08-07 LAB — COMPREHENSIVE METABOLIC PANEL
ALK PHOS: 96 IU/L (ref 39–117)
ALT: 20 IU/L (ref 0–44)
AST: 10 IU/L (ref 0–40)
Albumin/Globulin Ratio: 2 (ref 1.2–2.2)
Albumin: 4.5 g/dL (ref 4.0–5.0)
BUN/Creatinine Ratio: 13 (ref 9–20)
BUN: 8 mg/dL (ref 6–20)
Bilirubin Total: 0.6 mg/dL (ref 0.0–1.2)
CO2: 18 mmol/L — ABNORMAL LOW (ref 20–29)
CREATININE: 0.63 mg/dL — AB (ref 0.76–1.27)
Calcium: 9.7 mg/dL (ref 8.7–10.2)
Chloride: 102 mmol/L (ref 96–106)
GFR calc Af Amer: 146 mL/min/{1.73_m2} (ref >59)
GFR calc non Af Amer: 126 mL/min/{1.73_m2} (ref >59)
GLOBULIN, TOTAL: 2.3 g/dL (ref 1.5–4.5)
GLUCOSE: 356 mg/dL — AB (ref 65–99)
Potassium: 4.7 mmol/L (ref 3.5–5.2)
SODIUM: 138 mmol/L (ref 134–144)
Total Protein: 6.8 g/dL (ref 6.0–8.5)

## 2018-08-07 LAB — HEMOGLOBIN A1C
ESTIMATED AVERAGE GLUCOSE: 260 mg/dL
HEMOGLOBIN A1C: 10.7 % — AB (ref 4.8–5.6)

## 2018-08-08 NOTE — Assessment & Plan Note (Signed)
Discussed importance of compliance with medications and follow ups and long term effects of severely uncontrolled blood sugars. Will restart metformin, add trulicity and recheck G8L.

## 2018-08-11 ENCOUNTER — Encounter: Payer: Self-pay | Admitting: Family Medicine

## 2018-08-13 ENCOUNTER — Ambulatory Visit: Payer: Medicare Other

## 2018-09-30 ENCOUNTER — Telehealth: Payer: Self-pay | Admitting: Family Medicine

## 2018-09-30 NOTE — Telephone Encounter (Signed)
Copied from [place] [id number]. Topic: Quick Communication - Rx Refill/Question >> Sep 30, 2018 11:10 AM Keene Breath wrote: Medication: metFORMIN (GLUCOPHAGE) 1000 MG tablet / sildenafil (VIAGRA) 100 MG tablet  Patient called to request a refill for the above medication  Preferred Pharmacy (with phone number or street name): [id number] - [place], [address] [phone] (Phone) [phone] (Fax)

## 2018-10-01 MED ORDER — SILDENAFIL CITRATE 100 MG PO TABS: 50.0000 mg | ORAL_TABLET | Freq: Every day | ORAL | 2 refills | Status: DC | PRN

## 2018-10-01 MED ORDER — METFORMIN HCL 1000 MG PO TABS: 1000.0000 mg | ORAL_TABLET | Freq: Every day | ORAL | 2 refills | Status: DC

## 2018-10-01 NOTE — Telephone Encounter (Signed)
Will send over refills. Please remind patient about his upcoming appt and let him know that he will not be allowed any further no-shows to the practice without being discharged due to amount of past no shows

## 2018-10-07 NOTE — Telephone Encounter (Signed)
Spoke with patient and advised that he will not be allowed any further no-shows. Patient apologized for his behavior and says he will provide a 24-hour notice if he must miss another appointment but will make every effort to come as scheduled. He says he is extremely grateful that you will still be seeing him for care.

## 2018-11-06 ENCOUNTER — Ambulatory Visit: Payer: Medicare Other | Admitting: Family Medicine

## 2018-11-12 ENCOUNTER — Ambulatory Visit (INDEPENDENT_AMBULATORY_CARE_PROVIDER_SITE_OTHER): Payer: Medicare Other | Admitting: Family Medicine

## 2018-11-12 ENCOUNTER — Encounter: Payer: Self-pay | Admitting: Family Medicine

## 2018-11-12 ENCOUNTER — Other Ambulatory Visit: Payer: Self-pay

## 2018-11-12 VITALS — BP 129/85 | HR 73 | Temp 97.7°F

## 2018-11-12 DIAGNOSIS — K219 Gastro-esophageal reflux disease without esophagitis: Secondary | ICD-10-CM

## 2018-11-12 DIAGNOSIS — E1165 Type 2 diabetes mellitus with hyperglycemia: Secondary | ICD-10-CM | POA: Diagnosis not present

## 2018-11-12 DIAGNOSIS — N529 Male erectile dysfunction, unspecified: Secondary | ICD-10-CM | POA: Diagnosis not present

## 2018-11-12 MED ORDER — SILDENAFIL CITRATE 100 MG PO TABS: 50.0000 mg | ORAL_TABLET | Freq: Every day | ORAL | 2 refills | Status: DC | PRN

## 2018-11-12 MED ORDER — PANTOPRAZOLE SODIUM 40 MG PO TBEC: 40.0000 mg | DELAYED_RELEASE_TABLET | Freq: Every day | ORAL | 3 refills | Status: DC

## 2018-11-12 MED ORDER — GLIPIZIDE 5 MG PO TABS: 5.0000 mg | ORAL_TABLET | Freq: Two times a day (BID) | ORAL | 3 refills | Status: DC

## 2018-11-12 NOTE — Progress Notes (Signed)
BP 129/85   Pulse 73   Temp 97.7 F (36.5 C) (Oral)   SpO2 97%    Subjective:    Patient ID: Steve [name], male    DOB: [birth year], 38 y.o.   MRN: [id number]  HPI: Steve [name] is a 38 y.o. male  Chief Complaint  Patient presents with  . Diabetes    pt states he has not started Trulicity due to cost   Here today for 3 month DM f/u. Was unable to start trulicity due to insurance coverage and sporadically takes the metformin as it seems to cause him erectile issues and diarrhea. Does feel he's been working hard on improving his diet but still needs to make more changes. Denies low blood sugar spells. States the viagra does help with the ED that he has intermittently.   Having upper abdominal pain, bloating, belching, and reflux burning sxs the past few months as well. Pepto bismol or pepcid does seem to help quickly after taking them each time. Denies melena, hematemesis, weight loss, fevers.   Relevant past medical, surgical, family and social history reviewed and updated as indicated. Interim medical history since our last visit reviewed. Allergies and medications reviewed and updated.  Review of Systems  Per HPI unless specifically indicated above     Objective:    BP 129/85   Pulse 73   Temp 97.7 F (36.5 C) (Oral)   SpO2 97%   Wt Readings from Last 3 Encounters:  08/06/18 244 lb (110.7 kg)  11/14/16 254 lb (115.2 kg)  09/05/16 260 lb (117.9 kg)    Physical Exam Vitals signs and nursing note reviewed.  Constitutional:      Appearance: Normal appearance.  HENT:     Head: Atraumatic.  Eyes:     Extraocular Movements: Extraocular movements intact.     Conjunctiva/sclera: Conjunctivae normal.  Neck:     Musculoskeletal: Normal range of motion and neck supple.  Cardiovascular:     Rate and Rhythm: Normal rate and regular rhythm.  Pulmonary:     Effort: Pulmonary effort is normal.     Breath sounds: Normal breath sounds.  Abdominal:     General: Bowel  sounds are normal. There is no distension.     Palpations: Abdomen is soft.     Tenderness: There is no abdominal tenderness. There is no right CVA tenderness, left CVA tenderness or guarding.  Musculoskeletal: Normal range of motion.  Skin:    General: Skin is warm and dry.  Neurological:     General: No focal deficit present.     Mental Status: He is oriented to person, place, and time.  Psychiatric:        Mood and Affect: Mood normal.        Thought Content: Thought content normal.        Judgment: Judgment normal.     Results for orders placed or performed in visit on 11/12/18  Comprehensive metabolic panel  Result Value Ref Range   Glucose 271 (H) 65 - 99 mg/dL   BUN 8 6 - 20 mg/dL   Creatinine, Ser 0.73 (L) 0.76 - 1.27 mg/dL   GFR calc non Af Amer 118 >59 mL/min/1.73   GFR calc Af Amer 137 >59 mL/min/1.73   BUN/Creatinine Ratio 11 9 - 20   Sodium 135 134 - 144 mmol/L   Potassium 4.0 3.5 - 5.2 mmol/L   Chloride 100 96 - 106 mmol/L   CO2 19 (L) 20 - 29 mmol/L  Calcium 9.3 8.7 - 10.2 mg/dL   Total Protein 6.7 6.0 - 8.5 g/dL   Albumin 4.1 4.0 - 5.0 g/dL   Globulin, Total 2.6 1.5 - 4.5 g/dL   Albumin/Globulin Ratio 1.6 1.2 - 2.2   Bilirubin Total 0.4 0.0 - 1.2 mg/dL   Alkaline Phosphatase 87 39 - 117 IU/L   AST 11 0 - 40 IU/L   ALT 18 0 - 44 IU/L  HgB A1c  Result Value Ref Range   Hgb A1c MFr Bld 10.9 (H) 4.8 - 5.6 %   Est. average glucose Bld gHb Est-mCnc 266 mg/dL      Assessment & Plan:   Problem List Items Addressed This Visit      Digestive   GERD (gastroesophageal reflux disease)    Will start protonix, discussed dietary modifications      Relevant Medications   pantoprazole (PROTONIX) 40 MG tablet     Endocrine   Type 2 diabetes mellitus with hyperglycemia, without long-term current use of insulin (HCC) - Primary    Recheck A1C, coupon given for trulicity and will switch to what is covered if still not covered. D/c metformin due to side effects and  start glipizide. Continue working on lifestyle choices      Relevant Medications   glipiZIDE (GLUCOTROL) 5 MG tablet   Other Relevant Orders   Comprehensive metabolic panel (Completed)   HgB A1c (Completed)    Other Visit Diagnoses    Erectile dysfunction, unspecified erectile dysfunction type       Stable on prn viagra. Again discussed that better BS control may help with this issue       Follow up plan: Return in about 3 months (around 02/12/2019) for A1C.

## 2018-11-13 LAB — COMPREHENSIVE METABOLIC PANEL
ALT: 18 IU/L (ref 0–44)
AST: 11 IU/L (ref 0–40)
Albumin/Globulin Ratio: 1.6 (ref 1.2–2.2)
Albumin: 4.1 g/dL (ref 4.0–5.0)
Alkaline Phosphatase: 87 IU/L (ref 39–117)
BUN/Creatinine Ratio: 11 (ref 9–20)
BUN: 8 mg/dL (ref 6–20)
Bilirubin Total: 0.4 mg/dL (ref 0.0–1.2)
CO2: 19 mmol/L — ABNORMAL LOW (ref 20–29)
Calcium: 9.3 mg/dL (ref 8.7–10.2)
Chloride: 100 mmol/L (ref 96–106)
Creatinine, Ser: 0.73 mg/dL — ABNORMAL LOW (ref 0.76–1.27)
GFR calc Af Amer: 137 mL/min/{1.73_m2} (ref >59)
GFR calc non Af Amer: 118 mL/min/{1.73_m2} (ref >59)
Globulin, Total: 2.6 g/dL (ref 1.5–4.5)
Glucose: 271 mg/dL — ABNORMAL HIGH (ref 65–99)
Potassium: 4 mmol/L (ref 3.5–5.2)
Sodium: 135 mmol/L (ref 134–144)
Total Protein: 6.7 g/dL (ref 6.0–8.5)

## 2018-11-13 LAB — HEMOGLOBIN A1C
Est. average glucose Bld gHb Est-mCnc: 266 mg/dL
Hgb A1c MFr Bld: 10.9 % — ABNORMAL HIGH (ref 4.8–5.6)

## 2018-11-17 DIAGNOSIS — K219 Gastro-esophageal reflux disease without esophagitis: Secondary | ICD-10-CM | POA: Insufficient documentation

## 2018-11-17 NOTE — Assessment & Plan Note (Signed)
Will start protonix, discussed dietary modifications

## 2018-11-17 NOTE — Assessment & Plan Note (Signed)
Recheck A1C, coupon given for trulicity and will switch to what is covered if still not covered. D/c metformin due to side effects and start glipizide. Continue working on lifestyle choices

## 2019-02-16 ENCOUNTER — Ambulatory Visit: Payer: Medicare Other | Admitting: Family Medicine

## 2019-02-17 ENCOUNTER — Ambulatory Visit (INDEPENDENT_AMBULATORY_CARE_PROVIDER_SITE_OTHER): Payer: Medicare Other | Admitting: Family Medicine

## 2019-02-17 ENCOUNTER — Other Ambulatory Visit: Payer: Self-pay

## 2019-02-17 ENCOUNTER — Encounter: Payer: Self-pay | Admitting: Family Medicine

## 2019-02-17 VITALS — BP 128/78 | HR 73 | Temp 98.4°F | Ht 73.0 in | Wt 242.0 lb

## 2019-02-17 DIAGNOSIS — K219 Gastro-esophageal reflux disease without esophagitis: Secondary | ICD-10-CM | POA: Diagnosis not present

## 2019-02-17 DIAGNOSIS — R1013 Epigastric pain: Secondary | ICD-10-CM | POA: Diagnosis not present

## 2019-02-17 DIAGNOSIS — N529 Male erectile dysfunction, unspecified: Secondary | ICD-10-CM | POA: Diagnosis not present

## 2019-02-17 DIAGNOSIS — E1165 Type 2 diabetes mellitus with hyperglycemia: Secondary | ICD-10-CM

## 2019-02-17 MED ORDER — DEXILANT 60 MG PO CPDR: 60.0000 mg | DELAYED_RELEASE_CAPSULE | Freq: Every day | ORAL | 2 refills | Status: DC

## 2019-02-17 MED ORDER — SUCRALFATE 1 G PO TABS: 1.0000 g | ORAL_TABLET | Freq: Three times a day (TID) | ORAL | 0 refills | Status: DC

## 2019-02-17 MED ORDER — METOCLOPRAMIDE HCL 5 MG PO TABS: 5.0000 mg | ORAL_TABLET | Freq: Four times a day (QID) | ORAL | 0 refills | Status: DC

## 2019-02-17 NOTE — Progress Notes (Signed)
BP 128/78 (BP Location: Right Arm, Patient Position: Sitting, Cuff Size: Normal)   Pulse 73   Temp 98.4 F (36.9 C) (Oral)   Ht 6\' 1"  (1.854 m)   Wt 242 lb (109.8 kg)   SpO2 99%   BMI 31.93 kg/m    Subjective:    Patient ID: Steve [name], male    DOB: [birth year], 38 y.o.   MRN: [id number]  HPI: Steve [name] is a 38 y.o. male  Chief Complaint  Patient presents with  . Diabetes  . Abdominal Pain    Patient still having onging stomach pain. Patient states Protonix did not work.   Still having acid reflux, belching, bloating, and epigastric pain the past 4 or so months despite daily use of protonix and diet changes. Pepto bismol is the only thing that seems to help. Does also note some constipation. Now having BMs only about 1/week when he's typically been able to have a BM every day. Denies vomiting, melena, fevers, chills.   DM - doing very well on the trulicity, states he feels like his sugars are doing much better since making this addition. No longer having the urinary frequency or sexual dysfunction he used to have. Taking the glipizide without issue but does not he doesn't frequently remember his second dose daily. Denies low blood sugar spells. Trying to make good dietary changes.   Relevant past medical, surgical, family and social history reviewed and updated as indicated. Interim medical history since our last visit reviewed. Allergies and medications reviewed and updated.  Review of Systems  Per HPI unless specifically indicated above     Objective:    BP 128/78 (BP Location: Right Arm, Patient Position: Sitting, Cuff Size: Normal)   Pulse 73   Temp 98.4 F (36.9 C) (Oral)   Ht 6\' 1"  (1.854 m)   Wt 242 lb (109.8 kg)   SpO2 99%   BMI 31.93 kg/m   Wt Readings from Last 3 Encounters:  02/17/19 242 lb (109.8 kg)  08/06/18 244 lb (110.7 kg)  11/14/16 254 lb (115.2 kg)    Physical Exam Vitals signs and nursing note reviewed.  Constitutional:    Appearance: Normal appearance.  HENT:     Head: Atraumatic.  Eyes:     Extraocular Movements: Extraocular movements intact.     Conjunctiva/sclera: Conjunctivae normal.  Neck:     Musculoskeletal: Normal range of motion and neck supple.  Cardiovascular:     Rate and Rhythm: Normal rate and regular rhythm.  Pulmonary:     Effort: Pulmonary effort is normal.     Breath sounds: Normal breath sounds.  Abdominal:     General: Bowel sounds are normal. There is no distension.     Palpations: Abdomen is soft.     Tenderness: There is no abdominal tenderness. There is no right CVA tenderness, left CVA tenderness or guarding.  Musculoskeletal: Normal range of motion.  Skin:    General: Skin is warm and dry.  Neurological:     General: No focal deficit present.     Mental Status: He is oriented to person, place, and time.  Psychiatric:        Mood and Affect: Mood normal.        Thought Content: Thought content normal.        Judgment: Judgment normal.     Results for orders placed or performed in visit on [id number]  Basic metabolic panel  Result Value Ref Range   Glucose 153 (H) 65 -  99 mg/dL   BUN 7 6 - 20 mg/dL   Creatinine, Ser 0.86 0.76 - 1.27 mg/dL   GFR calc non Af Amer 111 >59 mL/min/1.73   GFR calc Af Amer 128 >59 mL/min/1.73   BUN/Creatinine Ratio 8 (L) 9 - 20   Sodium 140 134 - 144 mmol/L   Potassium 4.4 3.5 - 5.2 mmol/L   Chloride 107 (H) 96 - 106 mmol/L   CO2 20 20 - 29 mmol/L   Calcium 9.7 8.7 - 10.2 mg/dL  HgB A1c  Result Value Ref Range   Hgb A1c MFr Bld 6.2 (H) 4.8 - 5.6 %   Est. average glucose Bld gHb Est-mCnc A999333 mg/dL  Helicobacter pylori abs-IgG+IgA, bld  Result Value Ref Range   H. pylori, IgA Abs <9.0 0.0 - 8.9 units   H. pylori, IgG AbS 0.20 0.00 - 0.79 Index Value      Assessment & Plan:   Problem List Items Addressed This Visit      Digestive   GERD (gastroesophageal reflux disease)    Will switch to dexilant, add carafate prn and try reglan  in case this is more gastroparesis rather than his gastritis flaring up. Per his request, referral placed to GI for further eval. Will repeat H pylori testing though neg in 2018 given worsening nature of sxs.       Relevant Medications   sucralfate (CARAFATE) 1 g tablet   dexlansoprazole (DEXILANT) 60 MG capsule   metoCLOPramide (REGLAN) 5 MG tablet   Other Relevant Orders   Helicobacter pylori abs-IgG+IgA, bld (Completed)   Ambulatory referral to Gastroenterology     Endocrine   Type 2 diabetes mellitus with hyperglycemia, without long-term current use of insulin ([place]) - Primary    Feels improved since adding trulicity. Will check A1C, if still not to goal will switch glipizide to XR formulation to give him max benefit on the medication as he often forgets his second dose of the day. Continue good lifestyle changes      Relevant Orders   Basic metabolic panel (Completed)   HgB A1c (Completed)    Other Visit Diagnoses    Epigastric pain       Refractory GERD/gastritis vs possible gastroparesis from long term uncontrolled DM. Switch to dexilant, prn carafate, trial reglan. Refer to GI per his request   Relevant Orders   Ambulatory referral to Gastroenterology   Erectile dysfunction, unspecified erectile dysfunction type       Resolved per patient without need for viagra prn anymore. Will keep on list for now in case returning sxs        Follow up plan: Return in about 3 months (around 05/19/2019) for DM.

## 2019-02-17 NOTE — Assessment & Plan Note (Signed)
Will switch to dexilant, add carafate prn and try reglan in case this is more gastroparesis rather than his gastritis flaring up. Per his request, referral placed to GI for further eval. Will repeat H pylori testing though neg in 2018 given worsening nature of sxs.

## 2019-02-18 LAB — HEMOGLOBIN A1C
Est. average glucose Bld gHb Est-mCnc: 131 mg/dL
Hgb A1c MFr Bld: 6.2 % — ABNORMAL HIGH (ref 4.8–5.6)

## 2019-02-19 ENCOUNTER — Encounter: Payer: Self-pay | Admitting: Family Medicine

## 2019-02-19 LAB — BASIC METABOLIC PANEL
BUN/Creatinine Ratio: 8 — ABNORMAL LOW (ref 9–20)
BUN: 7 mg/dL (ref 6–20)
CO2: 20 mmol/L (ref 20–29)
Calcium: 9.7 mg/dL (ref 8.7–10.2)
Chloride: 107 mmol/L — ABNORMAL HIGH (ref 96–106)
Creatinine, Ser: 0.86 mg/dL (ref 0.76–1.27)
GFR calc Af Amer: 128 mL/min/{1.73_m2} (ref >59)
GFR calc non Af Amer: 111 mL/min/{1.73_m2} (ref >59)
Glucose: 153 mg/dL — ABNORMAL HIGH (ref 65–99)
Potassium: 4.4 mmol/L (ref 3.5–5.2)
Sodium: 140 mmol/L (ref 134–144)

## 2019-02-19 LAB — HELICOBACTER PYLORI ABS-IGG+IGA, BLD
H. pylori, IgA Abs: <9 units (ref 0.0–8.9)
H. pylori, IgG AbS: 0.2 Index Value (ref 0.00–0.79)

## 2019-02-22 NOTE — Assessment & Plan Note (Signed)
Feels improved since adding trulicity. Will check A1C, if still not to goal will switch glipizide to XR formulation to give him max benefit on the medication as he often forgets his second dose of the day. Continue good lifestyle changes

## 2019-03-22 ENCOUNTER — Ambulatory Visit: Payer: Medicare Other | Admitting: Gastroenterology

## 2019-03-22 ENCOUNTER — Encounter: Payer: Self-pay | Admitting: Gastroenterology

## 2019-04-12 ENCOUNTER — Other Ambulatory Visit: Payer: Self-pay | Admitting: Family Medicine

## 2019-04-12 MED ORDER — GLIPIZIDE 5 MG PO TABS: 5.0000 mg | ORAL_TABLET | Freq: Two times a day (BID) | ORAL | 3 refills | Status: DC

## 2019-04-12 MED ORDER — SUCRALFATE 1 G PO TABS: 1.0000 g | ORAL_TABLET | Freq: Three times a day (TID) | ORAL | 0 refills | Status: DC

## 2019-04-12 MED ORDER — TRULICITY 0.75 MG/0.5ML ~~LOC~~ SOAJ: 0.7500 mg | SUBCUTANEOUS | 3 refills | Status: DC

## 2019-04-12 NOTE — Telephone Encounter (Signed)
Pt request refill   Dulaglutide (TRULICITY) A999333 0000000 SOPN sucralfate (CARAFATE) 1 g tablet glipiZIDE (GLUCOTROL) 5 MG tablet  [id number] - [place], [place] - [address] [phone] (Phone) [phone] (Fax)

## 2019-05-08 DIAGNOSIS — R430 Anosmia: Secondary | ICD-10-CM | POA: Diagnosis not present

## 2019-05-08 DIAGNOSIS — Z113 Encounter for screening for infections with a predominantly sexual mode of transmission: Secondary | ICD-10-CM | POA: Diagnosis not present

## 2019-05-08 DIAGNOSIS — Z20828 Contact with and (suspected) exposure to other viral communicable diseases: Secondary | ICD-10-CM | POA: Diagnosis not present

## 2019-05-17 ENCOUNTER — Telehealth: Payer: Self-pay | Admitting: Family Medicine

## 2019-05-17 NOTE — Telephone Encounter (Signed)
Called pt to see if he would be able to do a virtual for his appt wednesday. Pt is unable to get vitals, he also stated that he had tested positive for Covid-19, but he says he comes off of quarantine today. Not seeing anything on my end. Please advise.

## 2019-05-18 NOTE — Telephone Encounter (Signed)
Set up virtual appt

## 2019-05-18 NOTE — Telephone Encounter (Signed)
We can do virtual and then get vitals when he comes for his labs or if he's feeling well and off quarantine can come in

## 2019-05-19 ENCOUNTER — Encounter: Payer: Self-pay | Admitting: Family Medicine

## 2019-05-19 ENCOUNTER — Other Ambulatory Visit: Payer: Self-pay

## 2019-05-19 ENCOUNTER — Ambulatory Visit (INDEPENDENT_AMBULATORY_CARE_PROVIDER_SITE_OTHER): Payer: Medicare Other | Admitting: Family Medicine

## 2019-05-19 VITALS — Ht 74.0 in | Wt 240.0 lb

## 2019-05-19 DIAGNOSIS — K219 Gastro-esophageal reflux disease without esophagitis: Secondary | ICD-10-CM

## 2019-05-19 DIAGNOSIS — E1165 Type 2 diabetes mellitus with hyperglycemia: Secondary | ICD-10-CM

## 2019-05-19 DIAGNOSIS — N529 Male erectile dysfunction, unspecified: Secondary | ICD-10-CM | POA: Diagnosis not present

## 2019-05-19 NOTE — Progress Notes (Signed)
Ht 6\' 2"  (1.88 m)   Wt 240 lb (108.9 kg)   BMI 30.81 kg/m    Subjective:    Patient ID: Steve [name], male    DOB: [birth year], 38 y.o.   MRN: [id number]  HPI: Steve [name] is a 38 y.o. male  Chief Complaint  Patient presents with  . Diabetes    . This visit was completed via WebEx due to the restrictions of the COVID-19 pandemic. All issues as above were discussed and addressed. Physical exam was done as above through visual confirmation on WebEx. If it was felt that the patient should be evaluated in the office, they were directed there. The patient verbally consented to this visit. . Location of the patient: in parked car . Location of the provider: home . Those involved with this call:  . Provider: Merrie Roof, PA-C . CMA: Lesle Chris, [place] . Front Desk/Registration: Jill Side  . Time spent on call: 15 minutes with patient face to face via video conference. More than 50% of this time was spent in counseling and coordination of care. 5 minutes total spent in review of patient's record and preparation of their chart. I verified patient identity using two factors (patient name and date of birth). Patient consents verbally to being seen via telemedicine visit today.   Reflux seems to be directly related to what he eats. When he doesn't eat fast food and unhealthy items his reflux seems much better. Has not tried prilosec,protonix and dexilant as well as reglan and carafate. Carafate seemed to help some for a short time but he's not really taking any of these anymore because nothing seemed to fix the issue. Waiting to get in with GI for further evaluation.   DM - tolerating the trulicity and glipizide well. No side effects, low blood sugar spells. Trying to improve diet due to his DM but also his GERD issues as above. Not exercising regularly.   ED - not having any issues since stopping metformin. Still has the viagra for prn if needed  Relevant past medical, surgical,  family and social history reviewed and updated as indicated. Interim medical history since our last visit reviewed. Allergies and medications reviewed and updated.  Review of Systems  Per HPI unless specifically indicated above     Objective:    Ht 6\' 2"  (1.88 m)   Wt 240 lb (108.9 kg)   BMI 30.81 kg/m   Wt Readings from Last 3 Encounters:  05/19/19 240 lb (108.9 kg)  02/17/19 242 lb (109.8 kg)  08/06/18 244 lb (110.7 kg)    Physical Exam Vitals and nursing note reviewed.  Constitutional:      General: He is not in acute distress.    Appearance: Normal appearance.  HENT:     Head: Atraumatic.     Right Ear: External ear normal.     Left Ear: External ear normal.     Nose: Nose normal. No congestion.     Mouth/Throat:     Mouth: Mucous membranes are moist.     Pharynx: Oropharynx is clear.  Eyes:     Extraocular Movements: Extraocular movements intact.     Conjunctiva/sclera: Conjunctivae normal.  Cardiovascular:     Rate and Rhythm: Normal rate and regular rhythm.  Pulmonary:     Effort: Pulmonary effort is normal. No respiratory distress.  Musculoskeletal:        General: Normal range of motion.     Cervical back: Normal range of motion.  Skin:  General: Skin is dry.     Findings: No erythema or rash.  Neurological:     Mental Status: He is oriented to person, place, and time.  Psychiatric:        Mood and Affect: Mood normal.        Thought Content: Thought content normal.        Judgment: Judgment normal.     Results for orders placed or performed in visit on [id number]  Basic metabolic panel  Result Value Ref Range   Glucose 153 (H) 65 - 99 mg/dL   BUN 7 6 - 20 mg/dL   Creatinine, Ser 0.86 0.76 - 1.27 mg/dL   GFR calc non Af Amer 111 >59 mL/min/1.73   GFR calc Af Amer 128 >59 mL/min/1.73   BUN/Creatinine Ratio 8 (L) 9 - 20   Sodium 140 134 - 144 mmol/L   Potassium 4.4 3.5 - 5.2 mmol/L   Chloride 107 (H) 96 - 106 mmol/L   CO2 20 20 - 29 mmol/L    Calcium 9.7 8.7 - 10.2 mg/dL  HgB A1c  Result Value Ref Range   Hgb A1c MFr Bld 6.2 (H) 4.8 - 5.6 %   Est. average glucose Bld gHb Est-mCnc A999333 mg/dL  Helicobacter pylori abs-IgG+IgA, bld  Result Value Ref Range   H. pylori, IgA Abs <9.0 0.0 - 8.9 units   H. pylori, IgG AbS 0.20 0.00 - 0.79 Index Value      Assessment & Plan:   Problem List Items Addressed This Visit      Digestive   GERD (gastroesophageal reflux disease) - Primary    Continue working on diet improvements for better control. Has dexilant and carafate to take prn, though doesn't as he states they don't help. Continue these prn        Endocrine   Type 2 diabetes mellitus with hyperglycemia, without long-term current use of insulin (HCC)    Recheck A1C, adjust as needed. Continue current medications and work on lifestyle modifications      Relevant Orders   HgB A1c    Other Visit Diagnoses    Erectile dysfunction, unspecified erectile dysfunction type       Resolving without metformin and with improved BS control. Continue good control of DM and has viagra for prn use       Follow up plan: Return in about 6 months (around 11/17/2019) for 6 month f/u.

## 2019-05-21 NOTE — Assessment & Plan Note (Signed)
Continue working on diet improvements for better control. Has dexilant and carafate to take prn, though doesn't as he states they don't help. Continue these prn

## 2019-05-21 NOTE — Assessment & Plan Note (Signed)
Recheck A1C, adjust as needed. Continue current medications and work on lifestyle modifications

## 2019-06-02 ENCOUNTER — Telehealth: Payer: Self-pay | Admitting: Family Medicine

## 2019-06-02 NOTE — Telephone Encounter (Signed)
Called to make 6 month f/u, Pt stated he would call to make appt.

## 2019-08-19 ENCOUNTER — Ambulatory Visit: Payer: Medicare Other

## 2019-09-09 ENCOUNTER — Ambulatory Visit (INDEPENDENT_AMBULATORY_CARE_PROVIDER_SITE_OTHER): Payer: Medicare Other

## 2019-09-09 DIAGNOSIS — Z0000 Encounter for general adult medical examination without abnormal findings: Secondary | ICD-10-CM

## 2019-09-09 NOTE — Progress Notes (Signed)
Subjective:   Steve [name] is a 39 y.o. male who presents for Medicare Annual/Subsequent preventive examination.  This visit is being conducted via phone call  - after an attmept to do on video chat - due to the COVID-19 pandemic. This patient has given me verbal consent via phone to conduct this visit, patient states they are participating from their home address. Some vital signs may be absent or patient reported.   Patient identification: identified by name, DOB, and current address.    Review of Systems:   Cardiac Risk Factors include: advanced age (>58men, >50 women);male gender;hypertension;dyslipidemia     Objective:    Vitals: There were no vitals taken for this visit.  There is no height or weight on file to calculate BMI.  Advanced Directives 09/09/2019 09/05/2016 11/20/2015 08/15/2015  Does Patient Have a Medical Advance Directive? No No No No    Tobacco Social History   Tobacco Use  Smoking Status Current Every Day Smoker  . Packs/day: 0.50  . Types: Cigarettes  Smokeless Tobacco Never Used     Ready to quit: No Counseling given: Yes   Clinical Intake:  Pre-visit preparation completed: Yes  Pain : No/denies pain     Nutritional Risks: None Diabetes: No  How often do you need to have someone help you when you read instructions, pamphlets, or other written materials from your doctor or pharmacy?: 1 - Never  Interpreter Needed?: No  Information entered by :: Tahiry Spicer,LPN  Past Medical History:  Diagnosis Date  . Diabetes mellitus without complication ([place])   . History of gastritis 09/2016  . Hypertension    History reviewed. No pertinent surgical history. Family History  Problem Relation Age of Onset  . Diabetes Brother   . Hypertension Brother   . Diabetes Maternal Aunt   . Hypertension Maternal Aunt   . Cancer Maternal Aunt        pancreas  . Diabetes Maternal Uncle   . Hypertension Maternal Uncle   . Cancer Maternal Uncle    bone  . Diabetes Maternal Grandmother   . Cancer Maternal Grandmother   . Diabetes Maternal Grandfather   . Cancer Maternal Uncle        lung  . COPD Neg Hx   . Heart disease Neg Hx   . Stroke Neg Hx    Social History   Socioeconomic History  . Marital status: Single    Spouse name: Not on file  . Number of children: Not on file  . Years of education: Not on file  . Highest education level: Not on file  Occupational History  . Not on file  Tobacco Use  . Smoking status: Current Every Day Smoker    Packs/day: 0.50    Types: Cigarettes  . Smokeless tobacco: Never Used  Substance and Sexual Activity  . Alcohol use: No  . Drug use: Yes    Types: Marijuana  . Sexual activity: Not on file  Other Topics Concern  . Not on file  Social History Narrative  . Not on file   Social Determinants of Health   Financial Resource Strain:   . Difficulty of Paying Living Expenses:   Food Insecurity:   . Worried About Charity fundraiser in the Last Year:   . Arboriculturist in the Last Year:   Transportation Needs:   . Film/video editor (Medical):   Marland Kitchen Lack of Transportation (Non-Medical):   Physical Activity:   . Days of  Exercise per Week:   . Minutes of Exercise per Session:   Stress:   . Feeling of Stress :   Social Connections:   . Frequency of Communication with Friends and Family:   . Frequency of Social Gatherings with Friends and Family:   . Attends Religious Services:   . Active Member of Clubs or Organizations:   . Attends Archivist Meetings:   Marland Kitchen Marital Status:     Outpatient Encounter Medications as of 09/09/2019  Medication Sig  . dexlansoprazole (DEXILANT) 60 MG capsule Take 1 capsule (60 mg total) by mouth daily.  . Dulaglutide (TRULICITY) [id number] [id number] SOPN Inject 0.75 mg into the skin once a week.  Marland Kitchen glipiZIDE (GLUCOTROL) 5 MG tablet Take 1 tablet (5 mg total) by mouth 2 (two) times daily before a meal.  . sildenafil (VIAGRA) 100 MG tablet Take  0.5-1 tablets (50-100 mg total) by mouth daily as needed for erectile dysfunction.  . sucralfate (CARAFATE) 1 g tablet Take 1 tablet (1 g total) by mouth 4 (four) times daily -  with meals and at bedtime.   No facility-administered encounter medications on file as of 09/09/2019.    Activities of Daily Living In your present state of health, do you have any difficulty performing the following activities: 09/09/2019 02/17/2019  Hearing? N N  Comment no hearing aids -  Vision? N N  Comment eyeglasses, [facility] -  Difficulty concentrating or making decisions? N N  Walking or climbing stairs? N N  Dressing or bathing? N N  Doing errands, shopping? N N  Preparing Food and eating ? N -  Using the Toilet? N -  In the past six months, have you accidently leaked urine? N -  Do you have problems with loss of bowel control? N -  Managing your Medications? N -  Managing your Finances? N -  Housekeeping or managing your Housekeeping? N -  Some recent data might be hidden    Patient Care Team: Volney American, PA-C as PCP - General (Family Medicine)   Assessment:   This is a routine wellness examination for Steve [name]  Exercise Activities and Dietary recommendations Current Exercise Habits: Home exercise routine, Type of exercise: walking, Time (Minutes): 30(2.5 miles), Frequency (Times/Week): 5, Weekly Exercise (Minutes/Week): 150, Intensity: Mild, Exercise limited by: None identified  Goals Addressed   None     Fall Risk: Fall Risk  09/09/2019 02/17/2019  Falls in the past year? 1 0  Number falls in past yr: 0 -  Injury with Fall? 0 -  Follow up - Falls evaluation completed    [place]:  Any stairs in or around the home? No  If so, are there any without handrails? No   Home free of loose throw rugs in walkways, pet beds, electrical cords, etc? Yes  Adequate lighting in your home to reduce risk of falls? Yes   ASSISTIVE DEVICES  UTILIZED TO PREVENT FALLS:  Life alert? No  Use of a cane, walker or w/c? No  Grab bars in the bathroom? No  Shower chair or bench in shower? No  Elevated toilet seat or a handicapped toilet? No   TIMED UP AND GO:  Unable to perform   Depression Screen PHQ 2/9 Scores 09/09/2019 02/17/2019 11/14/2016  PHQ - 2 Score 0 1 1  PHQ- 9 Score - 4 -    Cognitive Function         There is no  immunization history on file for this patient.  Qualifies for Shingles Vaccine? Yes  Zostavax completed n/a. Due for Shingrix. Education has been provided regarding the importance of this vaccine. Pt has been advised to call insurance company to determine out of pocket expense. Advised may also receive vaccine at local pharmacy or Health Dept. Verbalized acceptance and understanding.  Tdap: Discussed need for TD/TDAP vaccine, patient verbalized understanding that this is not covered as a preventative with there insurance and to call the office if he develops any new skin injuries, ie: cuts, scrapes, bug bites, or open wounds.  Flu Vaccine: declined  Pneumococcal Vaccine: declined  Covid-19 Vaccine:  Completed vaccines  Screening Tests Health Maintenance  Topic Date Due  . PNEUMOCOCCAL POLYSACCHARIDE VACCINE AGE 79-64 HIGH RISK  Never done  . FOOT EXAM  Never done  . URINE MICROALBUMIN  Never done  . OPHTHALMOLOGY EXAM  11/20/2018  . HEMOGLOBIN A1C  08/17/2019  . TETANUS/TDAP  02/17/2020 (Originally 02/25/2000)  . INFLUENZA VACCINE  01/02/2020  . HIV Screening  Completed   Cancer Screenings:  Colorectal Screening: not indicated   Lung Cancer Screening: (Low Dose CT Chest recommended if Age 36-80 years, 30 pack-year currently smoking OR have quit w/in 15years.) does not qualify.    Additional Screening:  Hepatitis C Screening: doesnt qualify   Vision Screening: Recommended annual ophthalmology exams for early detection of glaucoma and other disorders of the eye. Is the patient up to date  with their annual eye exam?  Yes  Who is the provider or what is the name of the office in which the pt attends annual eye exams? [facility]    Dental Screening: Recommended annual dental exams for proper oral hygiene  Community Resource Referral:  CRR required this visit?  No        Plan:  I have personally reviewed and addressed the Medicare Annual Wellness questionnaire and have noted the following in the patient's chart:  A. Medical and social history B. Use of alcohol, tobacco or illicit drugs  C. Current medications and supplements D. Functional ability and status E.  Nutritional status F.  Physical activity G. Advance directives H. List of other physicians I.  Hospitalizations, surgeries, and ER visits in previous 12 months J.  [place] such as hearing and vision if needed, cognitive and depression L. Referrals and appointments   In addition, I have reviewed and discussed with patient certain preventive protocols, quality metrics, and best practice recommendations. A written personalized care plan for preventive services as well as general preventive health recommendations were provided to patient.   Signed,   Bevelyn Ngo, LPN  [id number] Nurse Health Advisor   Nurse Notes: requesting new referral for GI.

## 2019-09-09 NOTE — Patient Instructions (Signed)
Mr. Picket , Thank you for taking time to come for your Medicare Wellness Visit. I appreciate your ongoing commitment to your health goals. Please review the following plan we discussed and let me know if I can assist you in the future.   Screening recommendations/referrals: Colonoscopy:  Not indicated  Recommended yearly ophthalmology/optometry visit for glaucoma screening and checkup Recommended yearly dental visit for hygiene and checkup  Vaccinations: Influenza vaccine:declined  Pneumococcal vaccine: declined  Tdap vaccine: declined  Shingles vaccine: shingrix eligible   Covid-19: We are recommending the vaccine to everyone who has not had an allergic reaction to any of the components of the vaccine. If you have specific questions about the vaccine, please bring them up with your health care provider to discuss them.   We will likely not be getting the vaccine in the office for the first rounds of vaccinations. The way they are releasing the vaccines is going to be through the health systems (like [facility], [facility], [facility], [facility]), through your county health department, or through the pharmacies.   The [facility] Department is giving vaccines to those 65+ and Health Care Workers Teachers and [place] providers start 07/28/19, Essential workers start 3/10 and those with co-morbidities start 08/25/19 Call [phone] to schedule  If you are 65+ you can get a vaccine through [facility] by signing up for an appointment.  You can sign up by going to: [email or web address].  You can get more information by going to: [email or web address]  [employer] next door is giving the [employer]- you can call [phone] or stop by there to schedule.  Advanced directives: Advance directive discussed with you today. Once this is complete please bring a copy in to our office so we can scan it into your chart.  Conditions/risks identified: If you wish to quit smoking,  help is available. For free tobacco cessation program offerings call the [facility] at [phone] or Live Well Line at [phone]. You may also visit [email or web address] or email [email or web address] for more information on other programs.   Next appointment: Follow up in one year for your annual wellness visit

## 2019-09-14 ENCOUNTER — Encounter: Payer: Self-pay | Admitting: Family Medicine

## 2019-09-14 ENCOUNTER — Ambulatory Visit (INDEPENDENT_AMBULATORY_CARE_PROVIDER_SITE_OTHER): Payer: Medicare Other | Admitting: Family Medicine

## 2019-09-14 DIAGNOSIS — E1165 Type 2 diabetes mellitus with hyperglycemia: Secondary | ICD-10-CM | POA: Diagnosis not present

## 2019-09-14 DIAGNOSIS — R1013 Epigastric pain: Secondary | ICD-10-CM | POA: Diagnosis not present

## 2019-09-14 MED ORDER — DEXILANT 60 MG PO CPDR: 60.0000 mg | DELAYED_RELEASE_CAPSULE | Freq: Every day | ORAL | 1 refills | Status: DC

## 2019-09-14 NOTE — Assessment & Plan Note (Signed)
Overdue for A1C, will obtain labs as soon as possible and continue current regimen. Continue working on improving diet and exercise. Adjust as needed based on results

## 2019-09-14 NOTE — Progress Notes (Signed)
There were no vitals taken for this visit.   Subjective:    Patient ID: Steve [name], male    DOB: 1980/07/19, 39 y.o.   MRN: [id number]  HPI: Steve [name] is a 39 y.o. male  Chief Complaint  Patient presents with  . Referral    pt states he wants to discuss going to GI for stomach issues he has been having    . This visit was completed via telephone due to the restrictions of the COVID-19 pandemic. All issues as above were discussed and addressed but no physical exam was performed. If it was felt that the patient should be evaluated in the office, they were directed there. The patient verbally consented to this visit. Patient was unable to complete an audio/visual visit due to Technical difficulties,Lack of internet. Due to the catastrophic nature of the COVID-19 pandemic, this visit was done through audio contact only. . Location of the patient: home . Location of the provider: home . Those involved with this call:  . Provider: Merrie Roof, PA-C . CMA: Lesle Chris, [place] . Front Desk/Registration: Jill Side  . Time spent on call: 15 minutes on the phone discussing health concerns. 5 minutes total spent in review of patient's record and preparation of their chart. I verified patient identity using two factors (patient name and date of birth). Patient consents verbally to being seen via telemedicine visit today.   Presenting today for f/u ongoing epigastric pain, bloating, belching the past 2 years or so. Tried dexilant and protonix but didn't notice a major difference, reglan didn't seem to help, carafate helps mildly for temporary relief. Denies melena, diarrhea, constipation, lower abdominal pain, vomiting. Negative last year for H pylori, no known hx of PUD or other GI issues.   Overdue for A1C, states he continues to do very well with his medications and that trulicity has made a big impact on his overall health and he's feeling well. Not checking home BSs regularly. Diet  has room for improvement. Staying active.   Relevant past medical, surgical, family and social history reviewed and updated as indicated. Interim medical history since our last visit reviewed. Allergies and medications reviewed and updated.  Review of Systems  Per HPI unless specifically indicated above     Objective:    There were no vitals taken for this visit.  Wt Readings from Last 3 Encounters:  05/19/19 240 lb (108.9 kg)  02/17/19 242 lb (109.8 kg)  08/06/18 244 lb (110.7 kg)    Physical Exam  Unable to perform PE due to patient lack of access to video technology for today's visit  Results for orders placed or performed in visit on [id number]  Basic metabolic panel  Result Value Ref Range   Glucose 153 (H) 65 - 99 mg/dL   BUN 7 6 - 20 mg/dL   Creatinine, Ser 0.86 0.76 - 1.27 mg/dL   GFR calc non Af Amer 111 >59 mL/min/1.73   GFR calc Af Amer 128 >59 mL/min/1.73   BUN/Creatinine Ratio 8 (L) 9 - 20   Sodium 140 134 - 144 mmol/L   Potassium 4.4 3.5 - 5.2 mmol/L   Chloride 107 (H) 96 - 106 mmol/L   CO2 20 20 - 29 mmol/L   Calcium 9.7 8.7 - 10.2 mg/dL  HgB A1c  Result Value Ref Range   Hgb A1c MFr Bld 6.2 (H) 4.8 - 5.6 %   Est. average glucose Bld gHb Est-mCnc A999333 mg/dL  Helicobacter pylori abs-IgG+IgA, bld  Result Value Ref Range   H. pylori, IgA Abs <9.0 0.0 - 8.9 units   H. pylori, IgG AbS 0.20 0.00 - 0.79 Index Value      Assessment & Plan:   Problem List Items Addressed This Visit      Endocrine   Type 2 diabetes mellitus with hyperglycemia, without long-term current use of insulin ([place]) - Primary    Overdue for A1C, will obtain labs as soon as possible and continue current regimen. Continue working on improving diet and exercise. Adjust as needed based on results       Other Visit Diagnoses    Epigastric pain       Restart dexilant, carafate prn, refer to GI for further evaluation given persistent nature of sxs   Relevant Orders   Ambulatory referral  to Gastroenterology       Follow up plan: Return in about 6 months (around 03/15/2020) for DM.

## 2019-09-23 ENCOUNTER — Telehealth: Payer: Self-pay | Admitting: Family Medicine

## 2019-09-23 NOTE — Chronic Care Management (AMB) (Signed)
  Chronic Care Management   Note  09/23/2019 Name: Steve [name] MRN: [id number] DOB: 10-13-1980  [name] is a 38 y.o. year old male who is a primary care patient of Volney American, [place]. I reached out to [facility] by phone today in response to a referral sent by [name].     [name] was given information about Chronic Care Management services today including:  1. CCM service includes personalized support from designated clinical staff supervised by his physician, including individualized plan of care and coordination with other care providers 2. 24/7 contact phone numbers for assistance for urgent and routine care needs. 3. Service will only be billed when office clinical staff spend 20 minutes or more in a month to coordinate care. 4. Only one practitioner may furnish and bill the service in a calendar month. 5. The patient may stop CCM services at any time (effective at the end of the month) by phone call to the office staff. 6. The patient will be responsible for cost sharing (co-pay) of up to 20% of the service fee (after annual deductible is met).  Patient agreed to services and verbal consent obtained.   Follow up plan: Telephone appointment with care management team member scheduled for:11/03/2019  Noreene Larsson, [place], [place], [place] [address] Direct Dial: [phone] [email or web address] Website: [email or web address]

## 2019-10-09 ENCOUNTER — Other Ambulatory Visit: Payer: Self-pay | Admitting: Family Medicine

## 2019-10-09 NOTE — Telephone Encounter (Signed)
Requested Prescriptions  Pending Prescriptions Disp Refills  . TRULICITY [id number] SOPN [Pharmacy Med Name: TRULICITY [id number] ML PEN] 2 pen 3    Sig: INJECT 0.75 MG INTO THE SKIN ONCE A WEEK.     Endocrinology:  Diabetes - GLP-1 Receptor Agonists Failed - 10/09/2019  9:58 AM      Failed - HBA1C is between 0 and 7.9 and within 180 days    Hgb A1c MFr Bld  Date Value Ref Range Status  02/17/2019 6.2 (H) 4.8 - 5.6 % Final    Comment:             Prediabetes: 5.7 - 6.4          Diabetes: >6.4          Glycemic control for adults with diabetes: <7.0          Passed - Valid encounter within last 6 months    Recent Outpatient Visits          3 weeks ago Type 2 diabetes mellitus with hyperglycemia, without long-term current use of insulin [facility])   [facility], [place], [place]   4 months ago Gastroesophageal reflux disease, unspecified whether esophagitis present   [facility], [place], [place]   7 months ago Type 2 diabetes mellitus with hyperglycemia, without long-term current use of insulin [facility])   [facility], [place], [place]   11 months ago Type 2 diabetes mellitus with hyperglycemia, without long-term current use of insulin [facility])   [facility], [place], [place]   1 year ago Type 2 diabetes mellitus with hyperglycemia, without long-term current use of insulin [facility])   [facility], [place], [place]      Future Appointments            In 5 months Orene Desanctis, [place], [place], [facility]

## 2019-10-14 ENCOUNTER — Other Ambulatory Visit: Payer: Self-pay

## 2019-10-14 ENCOUNTER — Ambulatory Visit (INDEPENDENT_AMBULATORY_CARE_PROVIDER_SITE_OTHER): Payer: Medicare Other | Admitting: Gastroenterology

## 2019-10-14 ENCOUNTER — Encounter: Payer: Self-pay | Admitting: Gastroenterology

## 2019-10-14 VITALS — BP 122/82 | HR 72 | Temp 97.1°F | Ht 74.0 in | Wt 246.8 lb

## 2019-10-14 DIAGNOSIS — R112 Nausea with vomiting, unspecified: Secondary | ICD-10-CM | POA: Diagnosis not present

## 2019-10-14 DIAGNOSIS — R194 Change in bowel habit: Secondary | ICD-10-CM | POA: Diagnosis not present

## 2019-10-14 NOTE — Progress Notes (Signed)
Gastroenterology Consultation  Referring Provider:     Volney American,* Primary Care Physician:  Volney American, PA-C Primary Gastroenterologist:  Dr. Allen Norris     Reason for Consultation:     Abdominal pain with stool incontinence        HPI:   Steve [name] is a 39 y.o. y/o male referred for consultation & management of abdominal pain with stool incontinence by Dr. Orene Desanctis, [place], PA-C.  This patient comes in today with a history of diabetes and epigastric pain.  The patient was restarted on his Dexilant and states that he takes it every day without any further heartburn but he continues to have abdominal pain.  The patient also reports that he will go to the bathroom to defecate and sit down and nothing will happen then he will go to bed and soil himself.  There is no report of any unexplained weight loss.  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  There is no black stools or bloody stools.  He does report that he has nausea in addition to his abdominal discomfort. The patient does admit that he does not have any good diet. The patient denies headache family history of colon cancer or colon polyps.  Past Medical History:  Diagnosis Date  . Diabetes mellitus without complication ([place])   . History of gastritis 09/2016  . Hypertension     History reviewed. No pertinent surgical history.  Prior to Admission medications   Medication Sig Start Date End Date Taking? Authorizing Provider  dexlansoprazole (DEXILANT) 60 MG capsule Take 1 capsule (60 mg total) by mouth daily. 09/14/19  Yes Volney American, PA-C  glipiZIDE (GLUCOTROL) 5 MG tablet Take 1 tablet (5 mg total) by mouth 2 (two) times daily before a meal. 04/12/19  Yes Volney American, PA-C  TRULICITY [id number] SOPN INJECT 0.75 MG INTO THE SKIN ONCE A WEEK. 10/09/19  Yes Volney American, PA-C  sildenafil (VIAGRA) 100 MG tablet Take 0.5-1 tablets (50-100 mg total) by mouth daily as  needed for erectile dysfunction. Patient not taking: Reported on 10/14/2019 11/12/18   Volney American, PA-C  sucralfate (CARAFATE) 1 g tablet Take 1 tablet (1 g total) by mouth 4 (four) times daily -  with meals and at bedtime. Patient not taking: Reported on 10/14/2019 04/12/19   Volney American, PA-C    Family History  Problem Relation Age of Onset  . Diabetes Brother   . Hypertension Brother   . Diabetes Maternal Aunt   . Hypertension Maternal Aunt   . Cancer Maternal Aunt        pancreas  . Diabetes Maternal Uncle   . Hypertension Maternal Uncle   . Cancer Maternal Uncle        bone  . Diabetes Maternal Grandmother   . Cancer Maternal Grandmother   . Diabetes Maternal Grandfather   . Cancer Maternal Uncle        lung  . COPD Neg Hx   . Heart disease Neg Hx   . Stroke Neg Hx      Social History   Tobacco Use  . Smoking status: Current Every Day Smoker    Packs/day: 0.50    Types: Cigarettes  . Smokeless tobacco: Never Used  Substance Use Topics  . Alcohol use: No  . Drug use: Yes    Types: Marijuana    Allergies as of 10/14/2019  . (No Known Allergies)    Review of Systems:  All systems reviewed and negative except where noted in HPI.   Physical Exam:  BP 122/82   Pulse 72   Temp (!) 97.1 F (36.2 C) (Temporal)   Ht 6\' 2"  (1.88 m)   Wt 246 lb 12.8 oz (111.9 kg)   BMI 31.69 kg/m  No LMP for male patient. General:   Alert,  Well-developed, well-nourished, pleasant and cooperative in NAD Head:  Normocephalic and atraumatic. Eyes:  Sclera clear, no icterus.   Conjunctiva pink.  Positive lateral nystagmus. Ears:  Normal auditory acuity. Neck:  Supple; no masses or thyromegaly. Lungs:  Respirations even and unlabored.  Clear throughout to auscultation.   No wheezes, crackles, or rhonchi. No acute distress. Heart:  Regular rate and rhythm; no murmurs, clicks, rubs, or gallops. Abdomen:  Normal bowel sounds.  No bruits.  Soft, non-tender and  non-distended without masses, hepatosplenomegaly or hernias noted.  No guarding or rebound tenderness.  Negative Carnett sign.   Rectal:  Deferred.  Pulses:  Normal pulses noted. Extremities:  No clubbing or edema.  No cyanosis. Neurologic:  Alert and oriented x3;  grossly normal neurologically. Skin: Multiple tattoos.  The patient is an albino.  No jaundice. Lymph Nodes:  No significant cervical adenopathy. Psych:  Alert and cooperative. Normal mood and affect.  Imaging Studies: No results found.  Assessment and Plan:   Steve [name] is a 39 y.o. y/o male who comes in today with a history of nausea stool incontinence with abdominal pain.  Abdominal pain does not appear to be musculoskeletal in nature and may be related to his diabetes.  The patient reports he has chronic nausea which also may be related to his diabetes.  The patient will be set up for a gastric emptying study and will also be set up for an EGD and colonoscopy due to his diarrhea and abdominal pain with a history of reflux and nausea.  The patient has also been told to try and watch his diet since he has identified it as being a possible cause for his symptoms.  The patient has been explained the plan agrees with it.    Lucilla Lame, MD. Marval Regal    Note: This dictation was prepared with Dragon dictation along with smaller phrase technology. Any transcriptional errors that result from this process are unintentional.

## 2019-10-15 ENCOUNTER — Other Ambulatory Visit: Payer: Self-pay

## 2019-10-15 DIAGNOSIS — R194 Change in bowel habit: Secondary | ICD-10-CM

## 2019-10-15 DIAGNOSIS — R112 Nausea with vomiting, unspecified: Secondary | ICD-10-CM

## 2019-10-29 ENCOUNTER — Encounter
Admission: RE | Admit: 2019-10-29 | Discharge: 2019-10-29 | Disposition: A | Discharge status: Home / Self Care | Payer: Medicare Other | Source: Ambulatory Visit | Attending: Gastroenterology | Admitting: Gastroenterology

## 2019-10-29 ENCOUNTER — Other Ambulatory Visit: Payer: Self-pay

## 2019-10-29 DIAGNOSIS — R112 Nausea with vomiting, unspecified: Secondary | ICD-10-CM | POA: Diagnosis not present

## 2019-10-29 DIAGNOSIS — K30 Functional dyspepsia: Secondary | ICD-10-CM | POA: Diagnosis not present

## 2019-10-29 MED ORDER — TECHNETIUM TC 99M SULFUR COLLOID
2.5090 | Freq: Once | INTRAVENOUS | Status: AC | PRN
  Administered 2019-10-29: 2.509 via ORAL

## 2019-11-02 ENCOUNTER — Other Ambulatory Visit: Payer: Self-pay

## 2019-11-02 ENCOUNTER — Telehealth: Payer: Self-pay

## 2019-11-02 NOTE — Telephone Encounter (Signed)
Pt notified of gastric emptying results.

## 2019-11-03 ENCOUNTER — Ambulatory Visit (INDEPENDENT_AMBULATORY_CARE_PROVIDER_SITE_OTHER): Payer: Medicare Other | Admitting: General Practice

## 2019-11-03 ENCOUNTER — Telehealth: Payer: Medicare Other | Admitting: General Practice

## 2019-11-03 DIAGNOSIS — Z720 Tobacco use: Secondary | ICD-10-CM

## 2019-11-03 DIAGNOSIS — K219 Gastro-esophageal reflux disease without esophagitis: Secondary | ICD-10-CM

## 2019-11-03 DIAGNOSIS — I10 Essential (primary) hypertension: Secondary | ICD-10-CM

## 2019-11-03 DIAGNOSIS — F17200 Nicotine dependence, unspecified, uncomplicated: Secondary | ICD-10-CM

## 2019-11-03 DIAGNOSIS — E1165 Type 2 diabetes mellitus with hyperglycemia: Secondary | ICD-10-CM

## 2019-11-03 NOTE — Chronic Care Management (AMB) (Signed)
Chronic Care Management   Initial Visit Note  11/03/2019 Name: Steve [name] MRN: [id number] DOB: Apr 05, 1981  Referred by: Volney American, PA-C Reason for referral : No chief complaint on file.   Mattheus Rauls is a 39 y.o. year old male who is a primary care patient of Volney American, [place]. The CCM team was consulted for assistance with chronic disease management and care coordination needs related to HTN, DMII and GERD with new onset of nausea and vomiting   Review of patient status, including review of consultants reports, relevant laboratory and other test results, and collaboration with appropriate care team members and the patient's provider was performed as part of comprehensive patient evaluation and provision of chronic care management services.    SDOH (Social Determinants of Health) assessments performed: Yes See Care Plan activities for detailed interventions related to SDOH  SDOH Interventions     Most Recent Value  SDOH Interventions  SDOH Interventions for the Following Domains  Tobacco  Financial Strain Interventions  Other (Comment) [careguide referral for help with utilities]  Stress Interventions  Provide Counseling, Other (Comment) [the patient wants to quit and wants to try "cold [place]" in a couple of weeks. Will do LCSW referral for help]  Social Connections Interventions  Other (Comment) [good support system. Works out at [employer] at least 3 times a week]       Medications: Outpatient Encounter Medications as of 11/03/2019  Medication Sig Note  . dexlansoprazole (DEXILANT) 60 MG capsule Take 1 capsule (60 mg total) by mouth daily.   Marland Kitchen glipiZIDE (GLUCOTROL) 5 MG tablet Take 1 tablet (5 mg total) by mouth 2 (two) times daily before a meal.   . sildenafil (VIAGRA) 100 MG tablet Take 0.5-1 tablets (50-100 mg total) by mouth daily as needed for erectile dysfunction. (Patient not taking: Reported on 10/14/2019) 02/17/2019: Only As Needed  . sucralfate  (CARAFATE) 1 g tablet Take 1 tablet (1 g total) by mouth 4 (four) times daily -  with meals and at bedtime. (Patient not taking: Reported on 10/14/2019)   . TRULICITY 5.63 SL/3.7DS SOPN INJECT 0.75 MG INTO THE SKIN ONCE A WEEK.    No facility-administered encounter medications on file as of 11/03/2019.     Objective:  BP Readings from Last 3 Encounters:  10/14/19 122/82  02/17/19 128/78  11/12/18 129/85    Goals Addressed            This Visit's Progress   . [name]: "My blood pressure is good" (pt-stated)       CARE PLAN ENTRY (see longtitudinal plan of care for additional care plan information)  Current Barriers:  . Chronic Disease Management support, education, and care coordination needs related to HTN and GERD with new onset of nausea and vomiting  Clinical Goal(s) related to HTN and GERD with new onset of nausea and vomiting  :  Over the next 120 days, patient will:  . Work with the care management team to address educational, disease management, and care coordination needs  . Begin or continue self health monitoring activities as directed today Measure and record blood pressure 1 times per week and adhere to a heart healthy/ADA diet . Call provider office for new or worsened signs and symptoms Blood pressure findings outside established parameters and New or worsened symptom related to GERD/Nausea/vomiting . Call care management team with questions or concerns . Verbalize basic understanding of patient centered plan of care established today  Interventions related to HTN and GERD  with new onset of nausea and vomiting  :  . Evaluation of current treatment plans and patient's adherence to plan as established by provider . Assessed patient understanding of disease states . Assessed patient's education and care coordination needs . Provided disease specific education to patient  . Collaborated with appropriate clinical care team members regarding patient needs. LCSW for help with  anxiety and smoking cessation, Pharmacist for help with medication reconciliation and possible what would help with smoking cessation.   Patient Self Care Activities related to HTN and GERD with new onset of Nausea and Vomiting :  . Patient is unable to independently self-manage chronic health conditions  Initial goal documentation     . [name]: Pt-"I only check my blood sugar once or twice every two weeks" (pt-stated)       CARE PLAN ENTRY (see longtitudinal plan of care for additional care plan information)  Objective:  Lab Results  Component Value Date   HGBA1C 6.2 (H) 02/17/2019 .   Lab Results  Component Value Date   CREATININE 0.86 02/17/2019   CREATININE 0.73 (L) 11/12/2018   CREATININE 0.63 (L) 08/06/2018 .   Marland Kitchen No results found for: EGFR  Current Barriers:  Marland Kitchen Knowledge Deficits related to basic Diabetes pathophysiology and self care/management . Knowledge Deficits related to medications used for management of diabetes . Does not use cbg meter regularly- checks blood sugars once or twice a week  Case Manager Clinical Goal(s):  Over the next 120 days, patient will demonstrate improved adherence to prescribed treatment plan for diabetes self care/management as evidenced by:  . daily monitoring and recording of CBG or as pcp recommends  . adherence to ADA/ carb modified diet . exercise 3 days/week . adherence to prescribed medication regimen  Interventions:  . Provided education to patient about basic DM disease process . Reviewed medications with patient and discussed importance of medication adherence . Discussed plans with patient for ongoing care management follow up and provided patient with direct contact information for care management team . Provided patient with written educational materials related to hypo and hyperglycemia and importance of correct treatment . Advised patient, providing education and rationale, to check cbg as prescribed  and record, calling pcp  for findings outside established parameters.    Patient Self Care Activities:  . UNABLE to independently manage diabetes effectively  Initial goal documentation     . [name]: Pt: "I am going to try to quit smoking in 2 weeks" (pt-stated)       CARE PLAN ENTRY (see longtitudinal plan of care for additional care plan information)  Current Barriers:  . Tobacco abuse of  > 20 years; currently smoking 1/2 ppd . Previous quit attempts, per the patient he has never tried to quit before. Plans on quitting "cold [place]" in a couple of weeks . Denies smoking within 30 minutes of waking up . Reports triggers to smoke include: health, stressors in life, anxiety . Reports motivation to quit smoking includes: knowing it will help him feel better . On a scale of 1-10, reports MOTIVATION to quit is 8 . On a scale of 1-10, reports CONFIDENCE in quitting is 7  Clinical Goal(s):  Marland Kitchen Over the next 120 days, patient will work with Comptroller Licensed Clinical Social Worker and provider towards tobacco cessation   Interventions: . Evaluation of current treatment plan reviewed . Provided contact information for [place] [facility]uit [facility] (1-800-QUIT-NOW). Patient will outreach this group for support. . Discussed plans with  patient for ongoing care management follow up and provided patient with direct contact information for care management team . Provided patient with written smoking cessation educational materials through the [employer] and [employer] educational system . Referred patient to pharmacy team . Provided contact information for [place] [facility] (1-800-QUIT-NOW). Patient will outreach this group for support.   Patient Self Care Activities:  . Patient will commit to reducing tobacco consumption  Initial goal documentation         Mr. Klingler was given information about Chronic Care Management services today including:  1. CCM service includes personalized support from designated clinical staff  supervised by his physician, including individualized plan of care and coordination with other care providers 2. 24/7 contact phone numbers for assistance for urgent and routine care needs. 3. Service will only be billed when office clinical staff spend 20 minutes or more in a month to coordinate care. 4. Only one practitioner may furnish and bill the service in a calendar month. 5. The patient may stop CCM services at any time (effective at the end of the month) by phone call to the office staff. 6. The patient will be responsible for cost sharing (co-pay) of up to 20% of the service fee (after annual deductible is met).  Patient agreed to services and verbal consent obtained.   Plan:   The care management team will reach out to the patient again over the next 30 to 60 days.   Noreene Larsson RN, MSN, [facility] Mobile: [phone]

## 2019-11-03 NOTE — Patient Instructions (Signed)
Visit Information  Goals Addressed            This Visit's Progress   . [name]: "My blood pressure is good" (pt-stated)       CARE PLAN ENTRY (see longtitudinal plan of care for additional care plan information)  Current Barriers:  . Chronic Disease Management support, education, and care coordination needs related to HTN and GERD with new onset of nausea and vomiting  Clinical Goal(s) related to HTN and GERD with new onset of nausea and vomiting  :  Over the next 120 days, patient will:  . Work with the care management team to address educational, disease management, and care coordination needs  . Begin or continue self health monitoring activities as directed today Measure and record blood pressure 1 times per week and adhere to a heart healthy/ADA diet . Call provider office for new or worsened signs and symptoms Blood pressure findings outside established parameters and New or worsened symptom related to GERD/Nausea/vomiting . Call care management team with questions or concerns . Verbalize basic understanding of patient centered plan of care established today  Interventions related to HTN and GERD with new onset of nausea and vomiting  :  . Evaluation of current treatment plans and patient's adherence to plan as established by provider . Assessed patient understanding of disease states . Assessed patient's education and care coordination needs . Provided disease specific education to patient  . Collaborated with appropriate clinical care team members regarding patient needs. LCSW for help with anxiety and smoking cessation, Pharmacist for help with medication reconciliation and possible what would help with smoking cessation.   Patient Self Care Activities related to HTN and GERD with new onset of Nausea and Vomiting :  . Patient is unable to independently self-manage chronic health conditions  Initial goal documentation     . [name]: Pt-"I only check my blood sugar once or twice  every two weeks" (pt-stated)       CARE PLAN ENTRY (see longtitudinal plan of care for additional care plan information)  Objective:  Lab Results  Component Value Date   HGBA1C 6.2 (H) 02/17/2019 .   Lab Results  Component Value Date   CREATININE 0.86 02/17/2019   CREATININE 0.73 (L) 11/12/2018   CREATININE 0.63 (L) 08/06/2018 .   Marland Kitchen No results found for: EGFR  Current Barriers:  Marland Kitchen Knowledge Deficits related to basic Diabetes pathophysiology and self care/management . Knowledge Deficits related to medications used for management of diabetes . Does not use cbg meter regularly- checks blood sugars once or twice a week  Case Manager Clinical Goal(s):  Over the next 120 days, patient will demonstrate improved adherence to prescribed treatment plan for diabetes self care/management as evidenced by:  . daily monitoring and recording of CBG or as pcp recommends  . adherence to ADA/ carb modified diet . exercise 3 days/week . adherence to prescribed medication regimen  Interventions:  . Provided education to patient about basic DM disease process . Reviewed medications with patient and discussed importance of medication adherence . Discussed plans with patient for ongoing care management follow up and provided patient with direct contact information for care management team . Provided patient with written educational materials related to hypo and hyperglycemia and importance of correct treatment . Advised patient, providing education and rationale, to check cbg as prescribed  and record, calling pcp for findings outside established parameters.    Patient Self Care Activities:  . UNABLE to independently manage diabetes effectively  Initial goal documentation     . [name]: Pt: "I am going to try to quit smoking in 2 weeks" (pt-stated)       CARE PLAN ENTRY (see longtitudinal plan of care for additional care plan information)  Current Barriers:  . Tobacco abuse of  > 20 years;  currently smoking 1/2 ppd . Previous quit attempts, per the patient he has never tried to quit before. Plans on quitting "cold [place]" in a couple of weeks . Denies smoking within 30 minutes of waking up . Reports triggers to smoke include: health, stressors in life, anxiety . Reports motivation to quit smoking includes: knowing it will help him feel better . On a scale of 1-10, reports MOTIVATION to quit is 8 . On a scale of 1-10, reports CONFIDENCE in quitting is 7  Clinical Goal(s):  Marland Kitchen Over the next 120 days, patient will work with Comptroller Licensed Clinical Social Worker and provider towards tobacco cessation   Interventions: . Evaluation of current treatment plan reviewed . Provided contact information for [place] [facility] (1-800-QUIT-NOW). Patient will outreach this group for support. . Discussed plans with patient for ongoing care management follow up and provided patient with direct contact information for care management team . Provided patient with written smoking cessation educational materials through the [place] and EMMI educational system . Referred patient to pharmacy team . Provided contact information for [place] [facility] (1-800-QUIT-NOW). Patient will outreach this group for support.   Patient Self Care Activities:  . Patient will commit to reducing tobacco consumption  Initial goal documentation        Steve [name] was given information about Chronic Care Management services today including:  1. CCM service includes personalized support from designated clinical staff supervised by his physician, including individualized plan of care and coordination with other care providers 2. 24/7 contact phone numbers for assistance for urgent and routine care needs. 3. Service will only be billed when office clinical staff spend 20 minutes or more in a month to coordinate care. 4. Only one practitioner may furnish and bill the service in a calendar month. 5. The  patient may stop CCM services at any time (effective at the end of the month) by phone call to the office staff. 6. The patient will be responsible for cost sharing (co-pay) of up to 20% of the service fee (after annual deductible is met).  Patient agreed to services and verbal consent obtained.   Patient verbalizes understanding of instructions provided today.   The care management team will reach out to the patient again over the next 30 to 60 days.   Noreene Larsson RN, MSN, [facility] Mobile: [phone]   Steps to Quit Smoking Smoking tobacco is the leading cause of preventable death. It can affect almost every organ in the body. Smoking puts you and people around you at risk for many serious, long-lasting (chronic) diseases. Quitting smoking can be hard, but it is one of the best things that you can do for your health. It is never too late to quit. How do I get ready to quit? When you decide to quit smoking, make a plan to help you succeed. Before you quit:  Pick a date to quit. Set a date within the next 2 weeks to give you time to prepare.  Write down the reasons why you are quitting. Keep this list in places where you will see it  often.  Tell your family, friends, and co-workers that you are quitting. Their support is important.  Talk with your doctor about the choices that may help you quit.  Find out if your health insurance will pay for these treatments.  Know the people, places, things, and activities that make you want to smoke (triggers). Avoid them. What first steps can I take to quit smoking?  Throw away all cigarettes at home, at work, and in your car.  Throw away the things that you use when you smoke, such as ashtrays and lighters.  Clean your car. Make sure to empty the ashtray.  Clean your home, including curtains and carpets. What can I do to help me quit smoking? Talk with  your doctor about taking medicines and seeing a counselor at the same time. You are more likely to succeed when you do both.  If you are pregnant or breastfeeding, talk with your doctor about counseling or other ways to quit smoking. Do not take medicine to help you quit smoking unless your doctor tells you to do so. To quit smoking: Quit right away  Quit smoking totally, instead of slowly cutting back on how much you smoke over a period of time.  Go to counseling. You are more likely to quit if you go to counseling sessions regularly. Take medicine You may take medicines to help you quit. Some medicines need a prescription, and some you can buy over-the-counter. Some medicines may contain a drug called nicotine to replace the nicotine in cigarettes. Medicines may:  Help you to stop having the desire to smoke (cravings).  Help to stop the problems that come when you stop smoking (withdrawal symptoms). Your doctor may ask you to use:  Nicotine patches, gum, or lozenges.  Nicotine inhalers or sprays.  Non-nicotine medicine that is taken by mouth. Find resources Find resources and other ways to help you quit smoking and remain smoke-free after you quit. These resources are most helpful when you use them often. They include:  Online chats with a Social worker.  Phone quitlines.  Printed Furniture conservator/restorer.  Support groups or group counseling.  Text messaging programs.  Mobile phone apps. Use apps on your mobile phone or tablet that can help you stick to your quit plan. There are many free apps for mobile phones and tablets as well as websites. Examples include Quit Guide from the [employer] and smokefree.gov  What things can I do to make it easier to quit?   Talk to your family and friends. Ask them to support and encourage you.  Call a phone quitline (1-800-QUIT-NOW), reach out to support groups, or work with a Social worker.  Ask people who smoke to not smoke around you.  Avoid places  that make you want to smoke, such as: ? Bars. ? Parties. ? Smoke-break areas at work.  Spend time with people who do not smoke.  Lower the stress in your life. Stress can make you want to smoke. Try these things to help your stress: ? Getting regular exercise. ? Doing deep-breathing exercises. ? Doing yoga. ? Meditating. ? Doing a body scan. To do this, close your eyes, focus on one area of your body at a time from head to toe. Notice which parts of your body are tense. Try to relax the muscles in those areas. How will I feel when I quit smoking? Day 1 to 3 weeks Within the first 24 hours, you may start to have some problems that come from quitting tobacco.  These problems are very bad 2-3 days after you quit, but they do not often last for more than 2-3 weeks. You may get these symptoms:  Mood swings.  Feeling restless, nervous, angry, or annoyed.  Trouble concentrating.  Dizziness.  Strong desire for high-sugar foods and nicotine.  Weight gain.  Trouble pooping (constipation).  Feeling like you may vomit (nausea).  Coughing or a sore throat.  Changes in how the medicines that you take for other issues work in your body.  Depression.  Trouble sleeping (insomnia). Week 3 and afterward After the first 2-3 weeks of quitting, you may start to notice more positive results, such as:  Better sense of smell and taste.  Less coughing and sore throat.  Slower heart rate.  Lower blood pressure.  Clearer skin.  Better breathing.  Fewer sick days. Quitting smoking can be hard. Do not give up if you fail the first time. Some people need to try a few times before they succeed. Do your best to stick to your quit plan, and talk with your doctor if you have any questions or concerns. Summary  Smoking tobacco is the leading cause of preventable death. Quitting smoking can be hard, but it is one of the best things that you can do for your health.  When you decide to quit  smoking, make a plan to help you succeed.  Quit smoking right away, not slowly over a period of time.  When you start quitting, seek help from your doctor, family, or friends. This information is not intended to replace advice given to you by your health care provider. Make sure you discuss any questions you have with your health care provider. Document Revised: 02/12/2019 Document Reviewed: 08/08/2018 Elsevier Patient Education  [place] DASH stands for "Dietary Approaches to Stop Hypertension." The DASH eating plan is a healthy eating plan that has been shown to reduce high blood pressure (hypertension). It may also reduce your risk for type 2 diabetes, heart disease, and stroke. The DASH eating plan may also help with weight loss. What are tips for following this plan?  General guidelines  Avoid eating more than 2,300 mg (milligrams) of salt (sodium) a day. If you have hypertension, you may need to reduce your sodium intake to 1,500 mg a day.  Limit alcohol intake to no more than 1 drink a day for nonpregnant women and 2 drinks a day for men. One drink equals 12 oz of beer, 5 oz of wine, or 1 oz of hard liquor.  Work with your health care provider to maintain a healthy body weight or to lose weight. Ask what an ideal weight is for you.  Get at least 30 minutes of exercise that causes your heart to beat faster (aerobic exercise) most days of the week. Activities may include walking, swimming, or biking.  Work with your health care provider or diet and nutrition specialist (dietitian) to adjust your eating plan to your individual calorie needs. Reading food labels   Check food labels for the amount of sodium per serving. Choose foods with less than 5 percent of the Daily Value of sodium. Generally, foods with less than 300 mg of sodium per serving fit into this eating plan.  To find whole grains, look for the word "whole" as the first word in the ingredient  list. Shopping  Buy products labeled as "low-sodium" or "no salt added."  Buy fresh foods. Avoid canned foods and premade or frozen meals.  Cooking  Avoid adding salt when cooking. Use salt-free seasonings or herbs instead of table salt or sea salt. Check with your health care provider or pharmacist before using salt substitutes.  Do not fry foods. Cook foods using healthy methods such as baking, boiling, grilling, and broiling instead.  Cook with heart-healthy oils, such as olive, canola, soybean, or sunflower oil. Meal planning  Eat a balanced diet that includes: ? 5 or more servings of fruits and vegetables each day. At each meal, try to fill half of your plate with fruits and vegetables. ? Up to 6-8 servings of whole grains each day. ? Less than 6 oz of lean meat, poultry, or fish each day. A 3-oz serving of meat is about the same size as a deck of cards. One egg equals 1 oz. ? 2 servings of low-fat dairy each day. ? A serving of nuts, seeds, or beans 5 times each week. ? Heart-healthy fats. Healthy fats called Omega-3 fatty acids are found in foods such as flaxseeds and coldwater fish, like sardines, salmon, and mackerel.  Limit how much you eat of the following: ? Canned or prepackaged foods. ? Food that is high in trans fat, such as fried foods. ? Food that is high in saturated fat, such as fatty meat. ? Sweets, desserts, sugary drinks, and other foods with added sugar. ? Full-fat dairy products.  Do not salt foods before eating.  Try to eat at least 2 vegetarian meals each week.  Eat more home-cooked food and less restaurant, buffet, and fast food.  When eating at a restaurant, ask that your food be prepared with less salt or no salt, if possible. What foods are recommended? The items listed may not be a complete list. Talk with your dietitian about what dietary choices are best for you. Grains Whole-grain or whole-wheat bread. Whole-grain or whole-wheat pasta. Brown  rice. [place]. Bulgur. Whole-grain and low-sodium cereals. Pita bread. Low-fat, low-sodium crackers. Whole-wheat flour tortillas. Vegetables Fresh or frozen vegetables (raw, steamed, roasted, or grilled). Low-sodium or reduced-sodium tomato and vegetable juice. Low-sodium or reduced-sodium tomato sauce and tomato paste. Low-sodium or reduced-sodium canned vegetables. Fruits All fresh, dried, or frozen fruit. Canned fruit in natural juice (without added sugar). Meat and other protein foods Skinless chicken or [place]. Ground chicken or [place]. Pork with fat trimmed off. Fish and seafood. Egg whites. Dried beans, peas, or lentils. Unsalted nuts, nut butters, and seeds. Unsalted canned beans. Lean cuts of beef with fat trimmed off. Low-sodium, lean deli meat. Dairy Low-fat (1%) or fat-free (skim) milk. Fat-free, low-fat, or reduced-fat cheeses. Nonfat, low-sodium ricotta or cottage cheese. Low-fat or nonfat yogurt. Low-fat, low-sodium cheese. Fats and oils Soft margarine without trans fats. Vegetable oil. Low-fat, reduced-fat, or light mayonnaise and salad dressings (reduced-sodium). Canola, safflower, olive, soybean, and sunflower oils. Avocado. Seasoning and other foods Herbs. Spices. Seasoning mixes without salt. Unsalted popcorn and pretzels. Fat-free sweets. What foods are not recommended? The items listed may not be a complete list. Talk with your dietitian about what dietary choices are best for you. Grains Baked goods made with fat, such as croissants, muffins, or some breads. Dry pasta or rice meal packs. Vegetables Creamed or fried vegetables. Vegetables in a cheese sauce. Regular canned vegetables (not low-sodium or reduced-sodium). Regular canned tomato sauce and paste (not low-sodium or reduced-sodium). Regular tomato and vegetable juice (not low-sodium or reduced-sodium). Angie Fava. Olives. Fruits Canned fruit in a light or heavy syrup. Fried fruit. Fruit in cream or butter  sauce. Meat and other protein foods Fatty cuts of meat. Ribs. Fried meat. Berniece Salines. Sausage. Bologna and other processed lunch meats. Salami. Fatback. Hotdogs. Bratwurst. Salted nuts and seeds. Canned beans with added salt. Canned or smoked fish. Whole eggs or egg yolks. Chicken or [place] with skin. Dairy Whole or 2% milk, cream, and half-and-half. Whole or full-fat cream cheese. Whole-fat or sweetened yogurt. Full-fat cheese. Nondairy creamers. Whipped toppings. Processed cheese and cheese spreads. Fats and oils Butter. Stick margarine. Lard. Shortening. Ghee. Bacon fat. Tropical oils, such as coconut, palm kernel, or palm oil. Seasoning and other foods Salted popcorn and pretzels. Onion salt, garlic salt, seasoned salt, table salt, and sea salt. Worcestershire sauce. Tartar sauce. Barbecue sauce. Teriyaki sauce. Soy sauce, including reduced-sodium. Steak sauce. Canned and packaged gravies. Fish sauce. Oyster sauce. Cocktail sauce. Horseradish that you find on the shelf. Ketchup. Mustard. Meat flavorings and tenderizers. Bouillon cubes. Hot sauce and Tabasco sauce. Premade or packaged marinades. Premade or packaged taco seasonings. Relishes. Regular salad dressings. Where to find more information:  National Heart, Lung, and [place]: https://wilson-eaton.com/  American Heart Association: www.heart.org Summary  The DASH eating plan is a healthy eating plan that has been shown to reduce high blood pressure (hypertension). It may also reduce your risk for type 2 diabetes, heart disease, and stroke.  With the DASH eating plan, you should limit salt (sodium) intake to 2,300 mg a day. If you have hypertension, you may need to reduce your sodium intake to 1,500 mg a day.  When on the DASH eating plan, aim to eat more fresh fruits and vegetables, whole grains, lean proteins, low-fat dairy, and heart-healthy fats.  Work with your health care provider or diet and nutrition specialist (dietitian) to adjust  your eating plan to your individual calorie needs. This information is not intended to replace advice given to you by your health care provider. Make sure you discuss any questions you have with your health care provider. Document Revised: 05/02/2017 Document Reviewed: 05/13/2016 Elsevier Patient Education  [place].  Diabetes Mellitus and Nutrition, Adult When you have diabetes (diabetes mellitus), it is very important to have healthy eating habits because your blood sugar (glucose) levels are greatly affected by what you eat and drink. Eating healthy foods in the appropriate amounts, at about the same times every day, can help you:  Control your blood glucose.  Lower your risk of heart disease.  Improve your blood pressure.  Reach or maintain a healthy weight. Every person with diabetes is different, and each person has different needs for a meal plan. Your health care provider may recommend that you work with a diet and nutrition specialist (dietitian) to make a meal plan that is best for you. Your meal plan may vary depending on factors such as:  The calories you need.  The medicines you take.  Your weight.  Your blood glucose, blood pressure, and cholesterol levels.  Your activity level.  Other health conditions you have, such as heart or kidney disease. How do carbohydrates affect me? Carbohydrates, also called carbs, affect your blood glucose level more than any other type of food. Eating carbs naturally raises the amount of glucose in your blood. Carb counting is a method for keeping track of how many carbs you eat. Counting carbs is important to keep your blood glucose at a healthy level, especially if you use insulin or take certain oral diabetes medicines. It is important to know how many carbs you can safely have in  each meal. This is different for every person. Your dietitian can help you calculate how many carbs you should have at each meal and for each  snack. Foods that contain carbs include:  Bread, cereal, rice, pasta, and crackers.  Potatoes and corn.  Peas, beans, and lentils.  Milk and yogurt.  Fruit and juice.  Desserts, such as cakes, cookies, ice cream, and candy. How does alcohol affect me? Alcohol can cause a sudden decrease in blood glucose (hypoglycemia), especially if you use insulin or take certain oral diabetes medicines. Hypoglycemia can be a life-threatening condition. Symptoms of hypoglycemia (sleepiness, dizziness, and confusion) are similar to symptoms of having too much alcohol. If your health care provider says that alcohol is safe for you, follow these guidelines:  Limit alcohol intake to no more than 1 drink per day for nonpregnant women and 2 drinks per day for men. One drink equals 12 oz of beer, 5 oz of wine, or 1 oz of hard liquor.  Do not drink on an empty stomach.  Keep yourself hydrated with water, diet soda, or unsweetened iced tea.  Keep in mind that regular soda, juice, and other mixers may contain a lot of sugar and must be counted as carbs. What are tips for following this plan?  Reading food labels  Start by checking the serving size on the "Nutrition Facts" label of packaged foods and drinks. The amount of calories, carbs, fats, and other nutrients listed on the label is based on one serving of the item. Many items contain more than one serving per package.  Check the total grams (g) of carbs in one serving. You can calculate the number of servings of carbs in one serving by dividing the total carbs by 15. For example, if a food has 30 g of total carbs, it would be equal to 2 servings of carbs.  Check the number of grams (g) of saturated and trans fats in one serving. Choose foods that have low or no amount of these fats.  Check the number of milligrams (mg) of salt (sodium) in one serving. Most people should limit total sodium intake to less than 2,300 mg per day.  Always check the  nutrition information of foods labeled as "low-fat" or "nonfat". These foods may be higher in added sugar or refined carbs and should be avoided.  Talk to your dietitian to identify your daily goals for nutrients listed on the label. Shopping  Avoid buying canned, premade, or processed foods. These foods tend to be high in fat, sodium, and added sugar.  Shop around the outside edge of the grocery store. This includes fresh fruits and vegetables, bulk grains, fresh meats, and fresh dairy. Cooking  Use low-heat cooking methods, such as baking, instead of high-heat cooking methods like deep frying.  Cook using healthy oils, such as olive, canola, or sunflower oil.  Avoid cooking with butter, cream, or high-fat meats. Meal planning  Eat meals and snacks regularly, preferably at the same times every day. Avoid going long periods of time without eating.  Eat foods high in fiber, such as fresh fruits, vegetables, beans, and whole grains. Talk to your dietitian about how many servings of carbs you can eat at each meal.  Eat 4-6 ounces (oz) of lean protein each day, such as lean meat, chicken, fish, eggs, or tofu. One oz of lean protein is equal to: ? 1 oz of meat, chicken, or fish. ? 1 egg. ?  cup of tofu.  Eat some  foods each day that contain healthy fats, such as avocado, nuts, seeds, and fish. Lifestyle  Check your blood glucose regularly.  Exercise regularly as told by your health care provider. This may include: ? 150 minutes of moderate-intensity or vigorous-intensity exercise each week. This could be brisk walking, biking, or water aerobics. ? Stretching and doing strength exercises, such as yoga or weightlifting, at least 2 times a week.  Take medicines as told by your health care provider.  Do not use any products that contain nicotine or tobacco, such as cigarettes and e-cigarettes. If you need help quitting, ask your health care provider.  Work with a Social worker or diabetes  educator to identify strategies to manage stress and any emotional and social challenges. Questions to ask a health care provider  Do I need to meet with a diabetes educator?  Do I need to meet with a dietitian?  What number can I call if I have questions?  When are the best times to check my blood glucose? Where to find more information:  American Diabetes Association: diabetes.org  Academy of Nutrition and Dietetics: www.eatright.[employer] of Diabetes and Digestive and Kidney Diseases (NIH): [employer] Summary  A healthy meal plan will help you control your blood glucose and maintain a healthy lifestyle.  Working with a diet and nutrition specialist (dietitian) can help you make a meal plan that is best for you.  Keep in mind that carbohydrates (carbs) and alcohol have immediate effects on your blood glucose levels. It is important to count carbs and to use alcohol carefully. This information is not intended to replace advice given to you by your health care provider. Make sure you discuss any questions you have with your health care provider. Document Revised: 05/02/2017 Document Reviewed: 06/24/2016 Elsevier Patient Education  2020 [employer].

## 2019-11-08 ENCOUNTER — Other Ambulatory Visit: Payer: Self-pay

## 2019-11-08 ENCOUNTER — Encounter: Payer: Self-pay | Admitting: Gastroenterology

## 2019-11-11 ENCOUNTER — Other Ambulatory Visit: Payer: Self-pay

## 2019-11-11 ENCOUNTER — Other Ambulatory Visit
Admission: RE | Admit: 2019-11-11 | Discharge: 2019-11-11 | Disposition: A | Discharge status: Home / Self Care | Payer: Medicare Other | Source: Ambulatory Visit | Attending: Gastroenterology | Admitting: Gastroenterology

## 2019-11-11 DIAGNOSIS — Z20822 Contact with and (suspected) exposure to covid-19: Secondary | ICD-10-CM | POA: Diagnosis not present

## 2019-11-11 DIAGNOSIS — Z01812 Encounter for preprocedural laboratory examination: Secondary | ICD-10-CM | POA: Insufficient documentation

## 2019-11-12 LAB — SARS CORONAVIRUS 2 (TAT 6-24 HRS): SARS Coronavirus 2: NEGATIVE

## 2019-11-12 NOTE — Discharge Instructions (Signed)
General Anesthesia, Adult, Care After This sheet gives you information about how to care for yourself after your procedure. Your health care provider may also give you more specific instructions. If you have problems or questions, contact your health care provider. What can I expect after the procedure? After the procedure, the following side effects are common:  Pain or discomfort at the IV site.  Nausea.  Vomiting.  Sore throat.  Trouble concentrating.  Feeling cold or chills.  Weak or tired.  Sleepiness and fatigue.  Soreness and body aches. These side effects can affect parts of the body that were not involved in surgery. Follow these instructions at home:  For at least 24 hours after the procedure:  Have a responsible adult stay with you. It is important to have someone help care for you until you are awake and alert.  Rest as needed.  Do not: ? Participate in activities in which you could fall or become injured. ? Drive. ? Use heavy machinery. ? Drink alcohol. ? Take sleeping pills or medicines that cause drowsiness. ? Make important decisions or sign legal documents. ? Take care of children on your own. Eating and drinking  Follow any instructions from your health care provider about eating or drinking restrictions.  When you feel hungry, start by eating small amounts of foods that are soft and easy to digest (bland), such as toast. Gradually return to your regular diet.  Drink enough fluid to keep your urine pale yellow.  If you vomit, rehydrate by drinking water, juice, or clear broth. General instructions  If you have sleep apnea, surgery and certain medicines can increase your risk for breathing problems. Follow instructions from your health care provider about wearing your sleep device: ? Anytime you are sleeping, including during daytime naps. ? While taking prescription pain medicines, sleeping medicines, or medicines that make you drowsy.  Return to  your normal activities as told by your health care provider. Ask your health care provider what activities are safe for you.  Take over-the-counter and prescription medicines only as told by your health care provider.  If you smoke, do not smoke without supervision.  Keep all follow-up visits as told by your health care provider. This is important. Contact a health care provider if:  You have nausea or vomiting that does not get better with medicine.  You cannot eat or drink without vomiting.  You have pain that does not get better with medicine.  You are unable to pass urine.  You develop a skin rash.  You have a fever.  You have redness around your IV site that gets worse. Get help right away if:  You have difficulty breathing.  You have chest pain.  You have blood in your urine or stool, or you vomit blood. Summary  After the procedure, it is common to have a sore throat or nausea. It is also common to feel tired.  Have a responsible adult stay with you for the first 24 hours after general anesthesia. It is important to have someone help care for you until you are awake and alert.  When you feel hungry, start by eating small amounts of foods that are soft and easy to digest (bland), such as toast. Gradually return to your regular diet.  Drink enough fluid to keep your urine pale yellow.  Return to your normal activities as told by your health care provider. Ask your health care provider what activities are safe for you. This information is not   intended to replace advice given to you by your health care provider. Make sure you discuss any questions you have with your health care provider. Document Revised: 05/23/2017 Document Reviewed: 01/03/2017 Elsevier Patient Education  2020 Elsevier Inc.  

## 2019-11-15 ENCOUNTER — Encounter
Admission: RE | Disposition: A | Discharge status: Home / Self Care | Payer: Self-pay | Source: Home / Self Care | Attending: Gastroenterology

## 2019-11-15 ENCOUNTER — Ambulatory Visit: Payer: Medicare Other | Admitting: Anesthesiology

## 2019-11-15 ENCOUNTER — Encounter: Payer: Self-pay | Admitting: Gastroenterology

## 2019-11-15 ENCOUNTER — Ambulatory Visit
Admission: RE | Admit: 2019-11-15 | Discharge: 2019-11-15 | Disposition: A | Discharge status: Home / Self Care | Payer: Medicare Other | Attending: Gastroenterology | Admitting: Gastroenterology

## 2019-11-15 ENCOUNTER — Other Ambulatory Visit: Payer: Self-pay

## 2019-11-15 DIAGNOSIS — I10 Essential (primary) hypertension: Secondary | ICD-10-CM | POA: Diagnosis not present

## 2019-11-15 DIAGNOSIS — K635 Polyp of colon: Secondary | ICD-10-CM

## 2019-11-15 DIAGNOSIS — K641 Second degree hemorrhoids: Secondary | ICD-10-CM | POA: Insufficient documentation

## 2019-11-15 DIAGNOSIS — R110 Nausea: Secondary | ICD-10-CM | POA: Insufficient documentation

## 2019-11-15 DIAGNOSIS — K317 Polyp of stomach and duodenum: Secondary | ICD-10-CM

## 2019-11-15 DIAGNOSIS — F17210 Nicotine dependence, cigarettes, uncomplicated: Secondary | ICD-10-CM | POA: Diagnosis not present

## 2019-11-15 DIAGNOSIS — Z7984 Long term (current) use of oral hypoglycemic drugs: Secondary | ICD-10-CM | POA: Diagnosis not present

## 2019-11-15 DIAGNOSIS — R634 Abnormal weight loss: Secondary | ICD-10-CM | POA: Insufficient documentation

## 2019-11-15 DIAGNOSIS — R194 Change in bowel habit: Secondary | ICD-10-CM | POA: Diagnosis not present

## 2019-11-15 DIAGNOSIS — K219 Gastro-esophageal reflux disease without esophagitis: Secondary | ICD-10-CM | POA: Diagnosis not present

## 2019-11-15 DIAGNOSIS — R197 Diarrhea, unspecified: Secondary | ICD-10-CM

## 2019-11-15 DIAGNOSIS — E119 Type 2 diabetes mellitus without complications: Secondary | ICD-10-CM | POA: Diagnosis not present

## 2019-11-15 DIAGNOSIS — D125 Benign neoplasm of sigmoid colon: Secondary | ICD-10-CM | POA: Insufficient documentation

## 2019-11-15 DIAGNOSIS — Z79899 Other long term (current) drug therapy: Secondary | ICD-10-CM | POA: Insufficient documentation

## 2019-11-15 DIAGNOSIS — K529 Noninfective gastroenteritis and colitis, unspecified: Secondary | ICD-10-CM | POA: Insufficient documentation

## 2019-11-15 DIAGNOSIS — Q402 Other specified congenital malformations of stomach: Secondary | ICD-10-CM | POA: Insufficient documentation

## 2019-11-15 HISTORY — DX: Gastro-esophageal reflux disease without esophagitis: K21.9

## 2019-11-15 HISTORY — PX: ESOPHAGOGASTRODUODENOSCOPY (EGD) WITH PROPOFOL: SHX5813

## 2019-11-15 HISTORY — PX: COLONOSCOPY WITH PROPOFOL: SHX5780

## 2019-11-15 LAB — GLUCOSE, CAPILLARY
Glucose-Capillary: 107 mg/dL — ABNORMAL HIGH (ref 70–99)
Glucose-Capillary: 110 mg/dL — ABNORMAL HIGH (ref 70–99)

## 2019-11-15 SURGERY — COLONOSCOPY WITH PROPOFOL
Anesthesia: General | Site: Rectum

## 2019-11-15 MED ORDER — LIDOCAINE HCL (CARDIAC) PF 100 MG/5ML IV SOSY
PREFILLED_SYRINGE | INTRAVENOUS | Status: DC | PRN
  Administered 2019-11-15: 40 mg via INTRAVENOUS

## 2019-11-15 MED ORDER — LACTATED RINGERS IV SOLN: INTRAVENOUS | Status: DC

## 2019-11-15 MED ORDER — ACETAMINOPHEN 325 MG PO TABS: 325.0000 mg | ORAL_TABLET | ORAL | Status: DC | PRN

## 2019-11-15 MED ORDER — STERILE WATER FOR IRRIGATION IR SOLN
Status: DC | PRN
  Administered 2019-11-15: 50 mL

## 2019-11-15 MED ORDER — ACETAMINOPHEN 160 MG/5ML PO SOLN: 325.0000 mg | ORAL | Status: DC | PRN

## 2019-11-15 MED ORDER — PROPOFOL 10 MG/ML IV BOLUS
INTRAVENOUS | Status: DC | PRN
  Administered 2019-11-15 (×2): 30 mg via INTRAVENOUS
  Administered 2019-11-15: 120 mg via INTRAVENOUS
  Administered 2019-11-15 (×4): 20 mg via INTRAVENOUS
  Administered 2019-11-15 (×2): 30 mg via INTRAVENOUS
  Administered 2019-11-15: 50 mg via INTRAVENOUS
  Administered 2019-11-15 (×2): 20 mg via INTRAVENOUS
  Administered 2019-11-15: 30 mg via INTRAVENOUS

## 2019-11-15 MED ORDER — ONDANSETRON HCL 4 MG/2ML IJ SOLN: 4.0000 mg | Freq: Once | INTRAMUSCULAR | Status: DC | PRN

## 2019-11-15 MED ORDER — GLYCOPYRROLATE 0.2 MG/ML IJ SOLN
INTRAMUSCULAR | Status: DC | PRN
  Administered 2019-11-15 (×2): .1 mg via INTRAVENOUS

## 2019-11-15 SURGICAL SUPPLY — 37 items
BALLN DILATOR 10-12 8 (BALLOONS)
BALLN DILATOR 12-15 8 (BALLOONS)
BALLN DILATOR 15-18 8 (BALLOONS)
BALLN DILATOR CRE 0-12 8 (BALLOONS)
BALLN DILATOR ESOPH 8 10 CRE (MISCELLANEOUS) IMPLANT
BALLOON DILATOR 12-15 8 (BALLOONS) IMPLANT
BALLOON DILATOR 15-18 8 (BALLOONS) IMPLANT
BALLOON DILATOR CRE 0-12 8 (BALLOONS) IMPLANT
BLOCK BITE 60FR ADLT L/F GRN (MISCELLANEOUS) ×3 IMPLANT
CLIP HMST 235XBRD CATH ROT (MISCELLANEOUS) IMPLANT
CLIP RESOLUTION 360 11X235 (MISCELLANEOUS)
ELECT REM PT RETURN 9FT ADLT (ELECTROSURGICAL)
ELECTRODE REM PT RTRN 9FT ADLT (ELECTROSURGICAL) IMPLANT
FCP ESCP3.2XJMB 240X2.8X (MISCELLANEOUS)
FORCEPS BIOP RAD 4 LRG CAP 4 (CUTTING FORCEPS) ×3 IMPLANT
FORCEPS BIOP RJ4 240 W/NDL (MISCELLANEOUS)
FORCEPS ESCP3.2XJMB 240X2.8X (MISCELLANEOUS) IMPLANT
GAUZE SPONGE 4X4 12PLY STRL (GAUZE/BANDAGES/DRESSINGS) ×6 IMPLANT
GOWN CVR UNV OPN BCK APRN NK (MISCELLANEOUS) ×4 IMPLANT
GOWN ISOL THUMB LOOP REG UNIV (MISCELLANEOUS) ×2
INJECTOR VARIJECT VIN23 (MISCELLANEOUS) IMPLANT
KIT DEFENDO VALVE AND CONN (KITS) IMPLANT
KIT ENDO PROCEDURE OLY (KITS) ×3 IMPLANT
MANIFOLD NEPTUNE II (INSTRUMENTS) IMPLANT
MARKER SPOT ENDO TATTOO 5ML (MISCELLANEOUS) IMPLANT
PROBE APC STR FIRE (PROBE) IMPLANT
RETRIEVER NET PLAT FOOD (MISCELLANEOUS) IMPLANT
RETRIEVER NET ROTH 2.5X230 LF (MISCELLANEOUS) IMPLANT
SNARE SHORT THROW 13M SML OVAL (MISCELLANEOUS) IMPLANT
SNARE SHORT THROW 30M LRG OVAL (MISCELLANEOUS) IMPLANT
SNARE SNG USE RND 15MM (INSTRUMENTS) IMPLANT
SPOT EX ENDOSCOPIC TATTOO (MISCELLANEOUS)
SYR INFLATION 60ML (SYRINGE) IMPLANT
TRAP ETRAP POLY (MISCELLANEOUS) IMPLANT
VARIJECT INJECTOR VIN23 (MISCELLANEOUS)
WATER STERILE IRR 250ML POUR (IV SOLUTION) ×3 IMPLANT
WIRE CRE 18-20MM 8CM F G (MISCELLANEOUS) IMPLANT

## 2019-11-15 NOTE — Anesthesia Postprocedure Evaluation (Signed)
Anesthesia Post Note  Patient: Steve [name]  Procedure(s) Performed: COLONOSCOPY WITH PROPOFOL WITH POLYPECTOMY, BIOPSIES (N/A Rectum) ESOPHAGOGASTRODUODENOSCOPY (EGD) WITH PROPOFOL WITH POLYPECTOMY, BIOPSIES (N/A Mouth)     Patient location during evaluation: PACU Anesthesia Type: General Level of consciousness: awake Pain management: pain level controlled Vital Signs Assessment: post-procedure vital signs reviewed and stable Respiratory status: respiratory function stable Cardiovascular status: stable Postop Assessment: no signs of nausea or vomiting Anesthetic complications: no   No complications documented.  Veda Canning

## 2019-11-15 NOTE — Transfer of Care (Signed)
Immediate Anesthesia Transfer of Care Note  Patient: Steve [name]  Procedure(s) Performed: COLONOSCOPY WITH PROPOFOL WITH POLYPECTOMY, BIOPSIES (N/A Rectum) ESOPHAGOGASTRODUODENOSCOPY (EGD) WITH PROPOFOL WITH POLYPECTOMY, BIOPSIES (N/A Mouth)  Patient Location: PACU  Anesthesia Type: General  Level of Consciousness: awake, alert  and patient cooperative  Airway and Oxygen Therapy: Patient Spontanous Breathing and Patient connected to supplemental oxygen  Post-op Assessment: Post-op Vital signs reviewed, Patient's Cardiovascular Status Stable, Respiratory Function Stable, Patent Airway and No signs of Nausea or vomiting  Post-op Vital Signs: Reviewed and stable  Complications: No complications documented.

## 2019-11-15 NOTE — Op Note (Signed)
[facility] Patient Name: Steve [name] Procedure Date: 11/15/2019 10:01 AM MRN: [id number] Account #: [id number] Date of Birth: 1981/05/20 Admit Type: Outpatient Age: 39 Room: [facility] OR ROOM 01 Gender: Male Note Status: Finalized Procedure:             Colonoscopy Indications:           Chronic diarrhea Providers:             Lucilla Lame MD, MD Medicines:             Propofol per Anesthesia Complications:         No immediate complications. Procedure:             Pre-Anesthesia Assessment:                        - Prior to the procedure, a History and Physical was                         performed, and patient medications and allergies were                         reviewed. The patient's tolerance of previous                         anesthesia was also reviewed. The risks and benefits                         of the procedure and the sedation options and risks                         were discussed with the patient. All questions were                         answered, and informed consent was obtained. Prior                         Anticoagulants: The patient has taken no previous                         anticoagulant or antiplatelet agents. ASA Grade                         Assessment: II - A patient with mild systemic disease.                         After reviewing the risks and benefits, the patient                         was deemed in satisfactory condition to undergo the                         procedure.                        After obtaining informed consent, the colonoscope was                         passed under direct vision. Throughout the procedure,  the patient's blood pressure, pulse, and oxygen                         saturations were monitored continuously. The was                         introduced through the anus and advanced to the the                         cecum, identified by appendiceal orifice  and ileocecal                         valve. The colonoscopy was performed without                         difficulty. The patient tolerated the procedure well.                         The quality of the bowel preparation was fair. Findings:      The perianal and digital rectal examinations were normal.      Two sessile polyps were found in the descending colon. The polyps were 3       to 4 mm in size. These polyps were removed with a cold biopsy forceps.       Resection and retrieval were complete.      Random biopsies were obtained with cold forceps for histology randomly       in the entire colon.      Non-bleeding internal hemorrhoids were found during retroflexion. The       hemorrhoids were Grade II (internal hemorrhoids that prolapse but reduce       spontaneously). Impression:            - Preparation of the colon was fair.                        - Two 3 to 4 mm polyps in the descending colon,                         removed with a cold biopsy forceps. Resected and                         retrieved.                        - Non-bleeding internal hemorrhoids.                        - Random biopsies were obtained in the entire colon. Recommendation:        - Discharge patient to home.                        - Resume previous diet.                        - Continue present medications.                        - Await pathology results. Procedure Code(s):     --- Professional ---  45380, Colonoscopy, flexible; with biopsy, single or                         multiple Diagnosis Code(s):     --- Professional ---                        K52.9, Noninfective gastroenteritis and colitis,                         unspecified                        K63.5, Polyp of colon CPT copyright 2019 American Medical Association. All rights reserved. The codes documented in this report are preliminary and upon coder review may  be revised to meet current compliance  requirements. Lucilla Lame MD, MD 11/15/2019 10:40:13 AM This report has been signed electronically. Number of Addenda: 0 Note Initiated On: 11/15/2019 10:01 AM Scope Withdrawal Time: 0 hours 8 minutes 52 seconds  Total Procedure Duration: 0 hours 18 minutes 36 seconds  Estimated Blood Loss:  Estimated blood loss: none. Estimated blood loss: none.      [facility]

## 2019-11-15 NOTE — Anesthesia Preprocedure Evaluation (Signed)
Anesthesia Evaluation  Patient identified by MRN, date of birth, ID band Patient awake    Reviewed: Allergy & Precautions, NPO status   Airway Mallampati: II  TM Distance: >3 FB     Dental   Pulmonary Current SmokerPatient did not abstain from smoking.,    breath sounds clear to auscultation       Cardiovascular hypertension,  Rhythm:Regular Rate:Normal     Neuro/Psych    GI/Hepatic GERD  ,  Endo/Other  diabetes, Type 2BMI 32   Renal/GU      Musculoskeletal   Abdominal   Peds  Hematology   Anesthesia Other Findings   Reproductive/Obstetrics                             Anesthesia Physical Anesthesia Plan  ASA: III  Anesthesia Plan: General   Post-op Pain Management:    Induction: Intravenous  PONV Risk Score and Plan: Propofol infusion, TIVA and Treatment may vary due to age or medical condition  Airway Management Planned: Natural Airway and Nasal Cannula  Additional Equipment:   Intra-op Plan:   Post-operative Plan:   Informed Consent: I have reviewed the patients History and Physical, chart, labs and discussed the procedure including the risks, benefits and alternatives for the proposed anesthesia with the patient or authorized representative who has indicated his/her understanding and acceptance.       Plan Discussed with: CRNA  Anesthesia Plan Comments:         Anesthesia Quick Evaluation

## 2019-11-15 NOTE — Anesthesia Procedure Notes (Signed)
Procedure Name: MAC Date/Time: 11/15/2019 10:07 AM Performed by: Vanetta Shawl, CRNA Pre-anesthesia Checklist: Patient identified, Emergency Drugs available, Suction available, Timeout performed and Patient being monitored Patient Re-evaluated:Patient Re-evaluated prior to induction Oxygen Delivery Method: Nasal cannula Placement Confirmation: positive ETCO2

## 2019-11-15 NOTE — Op Note (Signed)
[facility] Patient Name: Steve [name] Procedure Date: 11/15/2019 10:02 AM MRN: [id number] Account #: [id number] Date of Birth: [birth year] Admit Type: Outpatient Age: 39 Room: [facility] OR ROOM 01 Gender: Male Note Status: Finalized Procedure:             Upper GI endoscopy Indications:           Diarrhea, Nausea, Weight loss Providers:             Lucilla Lame MD, MD Referring MD:          Volney American (Referring MD) Medicines:             Propofol per Anesthesia Complications:         No immediate complications. Procedure:             Pre-Anesthesia Assessment:                        - Prior to the procedure, a History and Physical was                         performed, and patient medications and allergies were                         reviewed. The patient's tolerance of previous                         anesthesia was also reviewed. The risks and benefits                         of the procedure and the sedation options and risks                         were discussed with the patient. All questions were                         answered, and informed consent was obtained. Prior                         Anticoagulants: The patient has taken no previous                         anticoagulant or antiplatelet agents. ASA Grade                         Assessment: II - A patient with mild systemic disease.                         After reviewing the risks and benefits, the patient                         was deemed in satisfactory condition to undergo the                         procedure.                        After obtaining informed consent, the endoscope was  passed under direct vision. Throughout the procedure,                         the patient's blood pressure, pulse, and oxygen                         saturations were monitored continuously. The was                         introduced through the mouth, and  advanced to the                         second part of duodenum. The upper GI endoscopy was                         accomplished without difficulty. The patient tolerated                         the procedure well. Findings:      The examined esophagus was normal.      A medium amount of food (residue) was found in the entire examined       stomach.      A single medium-sized sessile polyp with no bleeding was found in the       duodenal bulb. This was biopsied with a cold forceps for histology.      Biopsies for histology were taken with a cold forceps in the entire       duodenum for evaluation of celiac disease. Impression:            - Normal esophagus.                        - A medium amount of food (residue) in the stomach.                        - A single duodenal polyp. Biopsied.                        - Biopsies were taken with a cold forceps for                         evaluation of celiac disease. Recommendation:        - Discharge patient to home.                        - Resume previous diet.                        - Continue present medications.                        - Await pathology results.                        - Perform a colonoscopy today. Procedure Code(s):     --- Professional ---                        [phone], Esophagogastroduodenoscopy, flexible,  transoral; with biopsy, single or multiple Diagnosis Code(s):     --- Professional ---                        R11.0, Nausea                        R63.4, Abnormal weight loss                        R19.7, Diarrhea, unspecified                        K31.7, Polyp of stomach and duodenum CPT copyright 2019 American Medical Association. All rights reserved. The codes documented in this report are preliminary and upon coder review may  be revised to meet current compliance requirements. Lucilla Lame MD, MD 11/15/2019 10:17:13 AM This report has been signed electronically. Number of Addenda:  0 Note Initiated On: 11/15/2019 10:02 AM Total Procedure Duration: 0 hours 3 minutes 32 seconds  Estimated Blood Loss:  Estimated blood loss: none.      [facility]

## 2019-11-15 NOTE — H&P (Signed)
Steve Lame, MD [facility] [address], [place], [place] [address] [phone] Fax : [phone]  Primary Care Physician:  Volney American, PA-C Primary Gastroenterologist:  Dr. Allen Norris  Pre-Procedure History & Physical: HPI:  Steve [name] is a 39 y.o. male is here for an endoscopy and colonoscopy.   Past Medical History:  Diagnosis Date  . Diabetes mellitus without complication ([place])   . GERD (gastroesophageal reflux disease)   . History of gastritis 09/2016  . Hypertension     Past Surgical History:  Procedure Laterality Date  . TOOTH EXTRACTION      Prior to Admission medications   Medication Sig Start Date End Date Taking? Authorizing Provider  dexlansoprazole (DEXILANT) 60 MG capsule Take 1 capsule (60 mg total) by mouth daily. 09/14/19  Yes Volney American, PA-C  TRULICITY 6.16 WV/3.7TG SOPN INJECT 0.75 MG INTO THE SKIN ONCE A WEEK. 10/09/19  Yes Volney American, PA-C  glipiZIDE (GLUCOTROL) 5 MG tablet Take 1 tablet (5 mg total) by mouth 2 (two) times daily before a meal. Patient not taking: Reported on 11/08/2019 04/12/19   Volney American, PA-C  sildenafil (VIAGRA) 100 MG tablet Take 0.5-1 tablets (50-100 mg total) by mouth daily as needed for erectile dysfunction. Patient not taking: Reported on 10/14/2019 11/12/18   Volney American, PA-C  sucralfate (CARAFATE) 1 g tablet Take 1 tablet (1 g total) by mouth 4 (four) times daily -  with meals and at bedtime. Patient not taking: Reported on 10/14/2019 04/12/19   Volney American, PA-C    Allergies as of 10/15/2019  . (No Known Allergies)    Family History  Problem Relation Age of Onset  . Diabetes Brother   . Hypertension Brother   . Diabetes Maternal Aunt   . Hypertension Maternal Aunt   . Cancer Maternal Aunt        pancreas  . Diabetes Maternal Uncle   . Hypertension Maternal Uncle   . Cancer Maternal Uncle        bone  . Diabetes Maternal Grandmother   .  Cancer Maternal Grandmother   . Diabetes Maternal Grandfather   . Cancer Maternal Uncle        lung  . COPD Neg Hx   . Heart disease Neg Hx   . Stroke Neg Hx     Social History   Socioeconomic History  . Marital status: Single    Spouse name: Not on file  . Number of children: Not on file  . Years of education: Not on file  . Highest education level: Not on file  Occupational History  . Not on file  Tobacco Use  . Smoking status: Current Every Day Smoker    Packs/day: 0.50    Years: 23.00    Pack years: 11.50    Types: Cigarettes  . Smokeless tobacco: Never Used  . Tobacco comment: since age 60  Vaping Use  . Vaping Use: Never used  Substance and Sexual Activity  . Alcohol use: No  . Drug use: Yes    Types: Marijuana  . Sexual activity: Not on file  Other Topics Concern  . Not on file  Social History Narrative  . Not on file   Social Determinants of Health   Financial Resource Strain: Medium Risk  . Difficulty of Paying Living Expenses: Somewhat hard  Food Insecurity: No Food Insecurity  . Worried About Charity fundraiser in the Last Year: Never true  . Ran  Out of Food in the Last Year: Never true  Transportation Needs: No Transportation Needs  . Lack of Transportation (Medical): No  . Lack of Transportation (Non-Medical): No  Physical Activity: Sufficiently Active  . Days of Exercise per Week: 3 days  . Minutes of Exercise per Session: 90 min  Stress: Stress Concern Present  . Feeling of Stress : To some extent  Social Connections: Socially Isolated  . Frequency of Communication with Friends and Family: More than three times a week  . Frequency of Social Gatherings with Friends and Family: More than three times a week  . Attends Religious Services: Never  . Active Member of Clubs or Organizations: No  . Attends Archivist Meetings: Never  . Marital Status: Never married  Intimate Partner Violence: Not At Risk  . Fear of Current or Ex-Partner:  No  . Emotionally Abused: No  . Physically Abused: No  . Sexually Abused: No    Review of Systems: See HPI, otherwise negative ROS  Physical Exam: BP 129/74   Pulse (!) 57   Temp (!) 97 F (36.1 C) (Temporal)   Resp 16   Ht 6\' 2"  (1.88 m)   Wt 109.8 kg   SpO2 99%   BMI 31.07 kg/m  General:   Alert,  pleasant and cooperative in NAD Head:  Normocephalic and atraumatic. Neck:  Supple; no masses or thyromegaly. Lungs:  Clear throughout to auscultation.    Heart:  Regular rate and rhythm. Abdomen:  Soft, nontender and nondistended. Normal bowel sounds, without guarding, and without rebound.   Neurologic:  Alert and  oriented x4;  grossly normal neurologically.  Impression/Plan: TAKEO [name] is here for an endoscopy and colonoscopy to be performed for diarrhe and weight loss   Risks, benefits, limitations, and alternatives regarding  endoscopy and colonoscopy have been reviewed with the patient.  Questions have been answered.  All parties agreeable.   Steve Lame, MD  11/15/2019, 10:02 AM

## 2019-11-16 ENCOUNTER — Encounter: Payer: Self-pay | Admitting: Gastroenterology

## 2019-11-16 LAB — SURGICAL PATHOLOGY

## 2019-11-22 ENCOUNTER — Telehealth: Payer: Self-pay

## 2019-11-22 NOTE — Telephone Encounter (Signed)
Copied from [place] [id number]. Topic: Referral - Status >> Nov 22, 2019  3:01 PM Simone Curia D wrote: 4/[birth year]/03 Spoke with patient about the [place] Program application. Emailed Emergency planning/management officer and information about [employer] and [employer]. Ambrose Mantle [phone]

## 2019-11-26 ENCOUNTER — Telehealth: Payer: Self-pay

## 2019-11-26 NOTE — Telephone Encounter (Signed)
Copied from [place] [phone]. Topic: Referral - Status >> Nov 26, 2019 26:71 PM Simone Curia D wrote: [birth year] Spoke with patient he requested that I email information to him again, confirmed email and sent again. Will follow up with patient next week.  Ambrose Mantle [phone]

## 2019-11-29 ENCOUNTER — Telehealth: Payer: Self-pay

## 2019-11-29 NOTE — Telephone Encounter (Signed)
Copied from [place] [phone]. Topic: Referral - Status >> Nov 29, 2019  3:95 PM Simone Curia D wrote: 08/20/21 Spoke with patient he received the emailed resources with instructions.  He has called the resources 6/25 and is waiting on a response. He also plans to fill out the attached [facility] application and email to [place].  No further assistance is needed at this time.  Closing referral. Ambrose Mantle [phone]

## 2019-12-28 ENCOUNTER — Telehealth: Payer: Medicare Other | Admitting: General Practice

## 2019-12-28 ENCOUNTER — Ambulatory Visit (INDEPENDENT_AMBULATORY_CARE_PROVIDER_SITE_OTHER): Payer: Medicaid Other | Admitting: General Practice

## 2019-12-28 DIAGNOSIS — E1165 Type 2 diabetes mellitus with hyperglycemia: Secondary | ICD-10-CM

## 2019-12-28 DIAGNOSIS — I10 Essential (primary) hypertension: Secondary | ICD-10-CM | POA: Diagnosis not present

## 2019-12-28 DIAGNOSIS — Z720 Tobacco use: Secondary | ICD-10-CM

## 2019-12-28 DIAGNOSIS — F17200 Nicotine dependence, unspecified, uncomplicated: Secondary | ICD-10-CM

## 2019-12-28 NOTE — Patient Instructions (Signed)
Visit Information  Goals Addressed              This Visit's Progress   .  [name]: "My blood pressure is good" (pt-stated)        CARE PLAN ENTRY (see longtitudinal plan of care for additional care plan information)  Current Barriers:  . Chronic Disease Management support, education, and care coordination needs related to HTN and GERD with new onset of nausea and vomiting  Clinical Goal(s) related to HTN and GERD with new onset of nausea and vomiting  :  Over the next 120 days, patient will:  . Work with the care management team to address educational, disease management, and care coordination needs  . Begin or continue self health monitoring activities as directed today Measure and record blood pressure 1 times per week and adhere to a heart healthy/ADA diet . Call provider office for new or worsened signs and symptoms Blood pressure findings outside established parameters and New or worsened symptom related to GERD/Nausea/vomiting . Call care management team with questions or concerns . Verbalize basic understanding of patient centered plan of care established today  Interventions related to HTN and GERD with new onset of nausea and vomiting  :  . Evaluation of current treatment plans and patient's adherence to plan as established by provider.  The patient had a colonoscopy in June but has not had follow up for results. Educated on calling the specialist for follow up for results and recommendations.  . Assessed patient understanding of disease states.  The patient states he feels fine and is doing well.  . Assessed patient's education and care coordination needs.  The patient denies any new concerns or needs at this time.  . Provided disease specific education to patient  . Collaborated with appropriate clinical care team members regarding patient needs. LCSW for help with anxiety and smoking cessation, Pharmacist for help with medication reconciliation and possible what would help with  smoking cessation.   Patient Self Care Activities related to HTN and GERD with new onset of Nausea and Vomiting :  . Patient is unable to independently self-manage chronic health conditions  Please see past updates related to this goal by clicking on the "Past Updates" button in the selected goal      .  [name]: Pt-"I only check my blood sugar once or twice every two weeks" (pt-stated)        CARE PLAN ENTRY (see longtitudinal plan of care for additional care plan information)  Objective:  Lab Results  Component Value Date   HGBA1C 6.2 (H) 02/17/2019 .   Lab Results  Component Value Date   CREATININE 0.86 02/17/2019   CREATININE 0.73 (L) 11/12/2018   CREATININE 0.63 (L) 08/06/2018 .   Marland Kitchen No results found for: EGFR  Current Barriers:  Marland Kitchen Knowledge Deficits related to basic Diabetes pathophysiology and self care/management . Knowledge Deficits related to medications used for management of diabetes . Does not use cbg meter regularly- checks blood sugars once or twice a week  Case Manager Clinical Goal(s):  Over the next 120 days, patient will demonstrate improved adherence to prescribed treatment plan for diabetes self care/management as evidenced by:  . daily monitoring and recording of CBG or as pcp recommends  . adherence to ADA/ carb modified diet . exercise 3 days/week . adherence to prescribed medication regimen  Interventions:  . Provided education to patient about basic DM disease process . Reviewed medications with patient and discussed importance of medication adherence.  The patient endorses compliance.   . Discussed plans with patient for ongoing care management follow up and provided patient with direct contact information for care management team . Provided patient with written educational materials related to hypo and hyperglycemia and importance of correct treatment . Advised patient, providing education and rationale, to check cbg as prescribed  and record, calling  pcp for findings outside established parameters.  The patient states that his blood sugars are doing well and denies any concerns related to glucose readings or dietary restrictions.  Patient Self Care Activities:  . UNABLE to independently manage diabetes effectively  Please see past updates related to this goal by clicking on the "Past Updates" button in the selected goal      .  [name]: Pt: "I am going to try to quit smoking in 2 weeks" (pt-stated)        [place] (see longtitudinal plan of care for additional care plan information)  Current Barriers:  . Tobacco abuse of  > 20 years; currently smoking 1/2 ppd . Previous quit attempts, per the patient he has never tried to quit before. Plans on quitting "cold [place]" in a couple of weeks . Denies smoking within 30 minutes of waking up . Reports triggers to smoke include: health, stressors in life, anxiety . Reports motivation to quit smoking includes: knowing it will help him feel better . On a scale of 1-10, reports MOTIVATION to quit is 8 . On a scale of 1-10, reports CONFIDENCE in quitting is 7  Clinical Goal(s):  Marland Kitchen Over the next 120 days, patient will work with Comptroller Licensed Clinical Social Worker and provider towards tobacco cessation   Interventions: . Evaluation of current treatment plan reviewed.  The patient verbalized he is trying to cut back and quit. Has not been successful. Will work with the CCM team to meet health and wellness goals.  . Provided contact information for [place] Quit Line (1-800-QUIT-NOW). Patient will outreach this group for support. . Discussed plans with patient for ongoing care management follow up and provided patient with direct contact information for care management team . Provided patient with written smoking cessation educational materials through the [place] and EMMI educational system . Referred patient to pharmacy team . Provided contact information for [place] Quit Line  (1-800-QUIT-NOW). Patient will outreach this group for support.   Patient Self Care Activities:  . Patient will commit to reducing tobacco consumption  Please see past updates related to this goal by clicking on the "Past Updates" button in the selected goal         Patient verbalizes understanding of instructions provided today.   Telephone follow up appointment with care management team member scheduled for: 02-23-2020 at 09:15 am  [place], MSN, [facility] Mobile: [phone]

## 2019-12-28 NOTE — Chronic Care Management (AMB) (Signed)
Chronic Care Management   Follow Up Note   12/28/2019 Name: Steve [name] MRN: [id number] DOB: 02-19-1981  Referred by: Volney American, PA-C Reason for referral : Chronic Care Management ([name] Follow up Call for Chronic Disease Management and Care Coordination Needs)   Steve [name] is a 39 y.o. year old male who is a primary care patient of Volney American, [place]. The CCM team was consulted for assistance with chronic disease management and care coordination needs.    Review of patient status, including review of consultants reports, relevant laboratory and other test results, and collaboration with appropriate care team members and the patient's provider was performed as part of comprehensive patient evaluation and provision of chronic care management services.    SDOH (Social Determinants of Health) assessments performed: Yes See Care Plan activities for detailed interventions related to [facility])     Outpatient Encounter Medications as of 12/28/2019  Medication Sig Note   dexlansoprazole (DEXILANT) 60 MG capsule Take 1 capsule (60 mg total) by mouth daily.    glipiZIDE (GLUCOTROL) 5 MG tablet Take 1 tablet (5 mg total) by mouth 2 (two) times daily before a meal. (Patient not taking: Reported on 11/08/2019)    sildenafil (VIAGRA) 100 MG tablet Take 0.5-1 tablets (50-100 mg total) by mouth daily as needed for erectile dysfunction. (Patient not taking: Reported on 10/14/2019) 02/17/2019: Only As Needed   sucralfate (CARAFATE) 1 g tablet Take 1 tablet (1 g total) by mouth 4 (four) times daily -  with meals and at bedtime. (Patient not taking: Reported on 08/24/5571)    TRULICITY 2.20 UR/4.2HC SOPN INJECT 0.75 MG INTO THE SKIN ONCE A WEEK.    No facility-administered encounter medications on file as of 12/28/2019.     Objective:  BP Readings from Last 3 Encounters:  11/15/19 94/61  10/14/19 122/82  02/17/19 128/78    Goals Addressed              This Visit's  Progress     [name]: "My blood pressure is good" (pt-stated)        CARE PLAN ENTRY (see longtitudinal plan of care for additional care plan information)  Current Barriers:   Chronic Disease Management support, education, and care coordination needs related to HTN and GERD with new onset of nausea and vomiting  Clinical Goal(s) related to HTN and GERD with new onset of nausea and vomiting  :  Over the next 120 days, patient will:   Work with the care management team to address educational, disease management, and care coordination needs   Begin or continue self health monitoring activities as directed today Measure and record blood pressure 1 times per week and adhere to a heart healthy/ADA diet  Call provider office for new or worsened signs and symptoms Blood pressure findings outside established parameters and New or worsened symptom related to GERD/Nausea/vomiting  Call care management team with questions or concerns  Verbalize basic understanding of patient centered plan of care established today  Interventions related to HTN and GERD with new onset of nausea and vomiting  :   Evaluation of current treatment plans and patient's adherence to plan as established by provider.  The patient had a colonoscopy in June but has not had follow up for results. Educated on calling the specialist for follow up for results and recommendations.   Assessed patient understanding of disease states.  The patient states he feels fine and is doing well.   Assessed patient's education and  care coordination needs.  The patient denies any new concerns or needs at this time.   Provided disease specific education to patient   Collaborated with appropriate clinical care team members regarding patient needs. LCSW for help with anxiety and smoking cessation, Pharmacist for help with medication reconciliation and possible what would help with smoking cessation.   Patient Self Care Activities related to HTN  and GERD with new onset of Nausea and Vomiting :   Patient is unable to independently self-manage chronic health conditions  Please see past updates related to this goal by clicking on the "Past Updates" button in the selected goal        [name]: Pt-"I only check my blood sugar once or twice every two weeks" (pt-stated)        CARE PLAN ENTRY (see longtitudinal plan of care for additional care plan information)  Objective:  Lab Results  Component Value Date   HGBA1C 6.2 (H) 02/17/2019    Lab Results  Component Value Date   CREATININE 0.86 02/17/2019   CREATININE 0.73 (L) 11/12/2018   CREATININE 0.63 (L) 08/06/2018     No results found for: EGFR  Current Barriers:   Knowledge Deficits related to basic Diabetes pathophysiology and self care/management  Knowledge Deficits related to medications used for management of diabetes  Does not use cbg meter regularly- checks blood sugars once or twice a week  Case Manager Clinical Goal(s):  Over the next 120 days, patient will demonstrate improved adherence to prescribed treatment plan for diabetes self care/management as evidenced by:   daily monitoring and recording of CBG or as pcp recommends   adherence to ADA/ carb modified diet  exercise 3 days/week  adherence to prescribed medication regimen  Interventions:   Provided education to patient about basic DM disease process  Reviewed medications with patient and discussed importance of medication adherence.  The patient endorses compliance.    Discussed plans with patient for ongoing care management follow up and provided patient with direct contact information for care management team  Provided patient with written educational materials related to hypo and hyperglycemia and importance of correct treatment  Advised patient, providing education and rationale, to check cbg as prescribed  and record, calling pcp for findings outside established parameters.  The patient  states that his blood sugars are doing well and denies any concerns related to glucose readings or dietary restrictions.  Patient Self Care Activities:   UNABLE to independently manage diabetes effectively  Please see past updates related to this goal by clicking on the "Past Updates" button in the selected goal        [name]: Pt: "I am going to try to quit smoking in 2 weeks" (pt-stated)        [place] (see longtitudinal plan of care for additional care plan information)  Current Barriers:   Tobacco abuse of  > 20 years; currently smoking 1/2 ppd  Previous quit attempts, per the patient he has never tried to quit before. Plans on quitting "cold [place]" in a couple of weeks  Denies smoking within 30 minutes of waking up  Reports triggers to smoke include: health, stressors in life, anxiety  Reports motivation to quit smoking includes: knowing it will help him feel better  On a scale of 1-10, reports MOTIVATION to quit is 8  On a scale of 1-10, reports CONFIDENCE in quitting is 7  Clinical Goal(s):   Over the next 120 days, patient will work with RN Case  Manager Pharmacist Licensed Clinical Social Worker and provider towards tobacco cessation   Interventions:  Evaluation of current treatment plan reviewed.  The patient verbalized he is trying to cut back and quit. Has not been successful. Will work with the CCM team to meet health and wellness goals.   Provided contact information for [place] Quit Line (1-800-QUIT-NOW). Patient will outreach this group for support.  Discussed plans with patient for ongoing care management follow up and provided patient with direct contact information for care management team  Provided patient with written smoking cessation educational materials through the [place] and EMMI educational system  Referred patient to pharmacy team  Provided contact information for [place] Quit Line (1-800-QUIT-NOW). Patient will outreach this group for  support.   Patient Self Care Activities:   Patient will commit to reducing tobacco consumption  Please see past updates related to this goal by clicking on the "Past Updates" button in the selected goal          Plan:   Telephone follow up appointment with care management team member scheduled for: 02-23-2020 at 09:15 am   [place], MSN, [facility] Mobile: [phone]

## 2020-01-05 ENCOUNTER — Ambulatory Visit (INDEPENDENT_AMBULATORY_CARE_PROVIDER_SITE_OTHER): Payer: Medicare Other | Admitting: Licensed Clinical Social Worker

## 2020-01-05 DIAGNOSIS — K219 Gastro-esophageal reflux disease without esophagitis: Secondary | ICD-10-CM

## 2020-01-05 DIAGNOSIS — Z720 Tobacco use: Secondary | ICD-10-CM

## 2020-01-05 DIAGNOSIS — I10 Essential (primary) hypertension: Secondary | ICD-10-CM | POA: Diagnosis not present

## 2020-01-05 DIAGNOSIS — E1165 Type 2 diabetes mellitus with hyperglycemia: Secondary | ICD-10-CM

## 2020-01-05 NOTE — Chronic Care Management (AMB) (Signed)
Chronic Care Management    Clinical Social Work Follow Up Note  01/05/2020 Name: Steve [name] MRN: [id number] DOB: Aug 05, 1980  Steve [name] is a 39 y.o. year old male who is a primary care patient of Volney American, [place]. The CCM team was consulted for assistance with [employer]  and [place] and Resources.   Review of patient status, including review of consultants reports, other relevant assessments, and collaboration with appropriate care team members and the patient's provider was performed as part of comprehensive patient evaluation and provision of chronic care management services.    SDOH (Social Determinants of Health) assessments performed: Yes    Outpatient Encounter Medications as of 01/05/2020  Medication Sig Note  . dexlansoprazole (DEXILANT) 60 MG capsule Take 1 capsule (60 mg total) by mouth daily.   Marland Kitchen glipiZIDE (GLUCOTROL) 5 MG tablet Take 1 tablet (5 mg total) by mouth 2 (two) times daily before a meal. (Patient not taking: Reported on 11/08/2019)   . sildenafil (VIAGRA) 100 MG tablet Take 0.5-1 tablets (50-100 mg total) by mouth daily as needed for erectile dysfunction. (Patient not taking: Reported on 10/14/2019) 02/17/2019: Only As Needed  . sucralfate (CARAFATE) 1 g tablet Take 1 tablet (1 g total) by mouth 4 (four) times daily -  with meals and at bedtime. (Patient not taking: Reported on 10/14/2019)   . TRULICITY 0.25 [place]/7.0WC SOPN INJECT 0.75 MG INTO THE SKIN ONCE A WEEK.    No facility-administered encounter medications on file as of 01/05/2020.     Goals Addressed    .   SW: "I would like to work on my anxiety so that I can quit smoking cigarettes." (pt-stated)        Current Barriers:  . Acute Mental Health needs related to Stress and Anxiety . Financial constraints related to managing health care expenses . Limited social support . ADL IADL limitations . Mental Health Concerns  . Social Isolation . Lacks knowledge of  community resource: available mental health resources that accept Medicare/Medicaid . Suicidal Ideation/Homicidal Ideation: No  Clinical Social Work Goal(s):  Marland Kitchen Over the next 120 days, patient will work with SW to address concerns related to lack of support/resource connection. LCSW will assist patient in gaining additional support/resource connection and community resource education in order to maintain health and mental health appropriately  . Over the next 120 days, patient will demonstrate improved adherence to self care as evidenced by implementing healthy self-care into his daily routine such as: attending all medical appointments, deep breathing exercises, taking time for self-reflection, taking medications as prescribed, drinking water and daily exercise to improve mobility.  . Over the next 120 days, patient will demonstrate improved health management independence as evidenced by implementing healthy self-care skills and positive support/resources into his daily routine to help cope with stressors and improve overall health and well-being   Interventions: . Patient interviewed and appropriate assessments performed: GAD 7 . Provided mental health counseling with regard to anxiety management  . Provided patient with information about coping skills to implement into his daily routine to combat anxiety and panic. Marland Kitchen Discussed plans with patient for ongoing care management follow up and provided patient with direct contact information for care management team . Advised patient to check his email for list of available mental health support resources that were sent out on 01/05/20 for him to revert back to. . Assisted patient/caregiver with obtaining information about health plan benefits . Referred patient to [place] [facility] (mental health provider) for long term follow up and therapy/counseling . Patient was alert, oriented x 4. Patient indicates stress around health. Patient reports  experiencing vomiting and nausea 3-4 times per week. Patient and LCSW discussed a plan for smoking cessation. Patient does not wish to set a date to quit yet. LCSW discussed coping skills for future triggers. SW used active and reflective listening, validated patient's feelings/concerns, and provided emotional support throughout entire session.  . Brief CBT and Solution Focused interventions provided during session to help cope with stressors and plan to discontinue cigarette usage with adding support within his network.   Depression screen [facility] 2/9 11/03/2019 09/09/2019 02/17/2019 11/14/2016  Decreased Interest 0 0 1 1  Down, Depressed, Hopeless 0 0 0 0  PHQ - 2 Score 0 0 1 1  Altered sleeping - - 0 -  Tired, decreased energy - - 2 -  Change in appetite - - 1 -  Feeling bad or failure about yourself  - - 0 -  Trouble concentrating - - 0 -  Moving slowly or fidgety/restless - - 0 -  Suicidal thoughts - - 0 -  PHQ-9 Score - - 4 -    Patient Self Care Activities:  . Self administers medications as prescribed . Attends all scheduled provider appointments . Performs ADL's independently . Performs IADL's independently . Calls provider office for new concerns or questions . Ability for insight . Independent living . Motivation for treatment  Patient Coping Strengths:  . Hopefulness . Self Advocate . Able to Communicate Effectively  Patient Self Care Deficits:  . Lacks social connections  Initial goal documentation       Follow Up Plan: SW will follow up with patient by phone over the next 60-90 days   Eula Fried, [place], MSW, [email or web address] Phone: [phone]

## 2020-02-23 ENCOUNTER — Telehealth: Payer: Medicare Other | Admitting: General Practice

## 2020-02-23 ENCOUNTER — Telehealth: Payer: Self-pay

## 2020-02-23 ENCOUNTER — Ambulatory Visit (INDEPENDENT_AMBULATORY_CARE_PROVIDER_SITE_OTHER): Payer: Medicaid Other | Admitting: General Practice

## 2020-02-23 DIAGNOSIS — I10 Essential (primary) hypertension: Secondary | ICD-10-CM | POA: Diagnosis not present

## 2020-02-23 DIAGNOSIS — R1013 Epigastric pain: Secondary | ICD-10-CM

## 2020-02-23 DIAGNOSIS — F17200 Nicotine dependence, unspecified, uncomplicated: Secondary | ICD-10-CM

## 2020-02-23 DIAGNOSIS — E1165 Type 2 diabetes mellitus with hyperglycemia: Secondary | ICD-10-CM

## 2020-02-23 DIAGNOSIS — Z720 Tobacco use: Secondary | ICD-10-CM

## 2020-02-23 NOTE — Chronic Care Management (AMB) (Signed)
Chronic Care Management   Follow Up Note   02/23/2020 Name: Steve [name] MRN: [id number] DOB: 04-May-1981  Referred by: Volney American, PA-C Reason for referral : Chronic Care Management ([name]: Follow up call for chronic disease management and care coordination needs)   Steve [name] is a 39 y.o. year old male who is a primary care patient of Volney American, [place]. The CCM team was consulted for assistance with chronic disease management and care coordination needs.    Review of patient status, including review of consultants reports, relevant laboratory and other test results, and collaboration with appropriate care team members and the patient's provider was performed as part of comprehensive patient evaluation and provision of chronic care management services.    SDOH (Social Determinants of Health) assessments performed: Yes See Care Plan activities for detailed interventions related to [facility])     Outpatient Encounter Medications as of 02/23/2020  Medication Sig Note   dexlansoprazole (DEXILANT) 60 MG capsule Take 1 capsule (60 mg total) by mouth daily.    glipiZIDE (GLUCOTROL) 5 MG tablet Take 1 tablet (5 mg total) by mouth 2 (two) times daily before a meal. (Patient not taking: Reported on 11/08/2019)    sildenafil (VIAGRA) 100 MG tablet Take 0.5-1 tablets (50-100 mg total) by mouth daily as needed for erectile dysfunction. (Patient not taking: Reported on 10/14/2019) 02/17/2019: Only As Needed   sucralfate (CARAFATE) 1 g tablet Take 1 tablet (1 g total) by mouth 4 (four) times daily -  with meals and at bedtime. (Patient not taking: Reported on 7/00/1749)    TRULICITY 4.49 QP/5.9FM SOPN INJECT 0.75 MG INTO THE SKIN ONCE A WEEK.    No facility-administered encounter medications on file as of 02/23/2020.     Objective:  BP Readings from Last 3 Encounters:  11/15/19 94/61  10/14/19 122/82  02/17/19 128/78    Goals Addressed              This Visit's  Progress     [name]: "My blood pressure is good" (pt-stated)        CARE PLAN ENTRY (see longtitudinal plan of care for additional care plan information)  Current Barriers:   Chronic Disease Management support, education, and care coordination needs related to HTN and GERD with new onset of nausea and vomiting  Clinical Goal(s) related to HTN and GERD with new onset of nausea and vomiting  :  Over the next 120 days, patient will:   Work with the care management team to address educational, disease management, and care coordination needs   Begin or continue self health monitoring activities as directed today Measure and record blood pressure 1 times per week and adhere to a heart healthy/ADA diet  Call provider office for new or worsened signs and symptoms Blood pressure findings outside established parameters and New or worsened symptom related to GERD/Nausea/vomiting  Call care management team with questions or concerns  Verbalize basic understanding of patient centered plan of care established today  Interventions related to HTN and GERD with new onset of nausea and vomiting  :   Evaluation of current treatment plans and patient's adherence to plan as established by provider.  The patient had a colonoscopy in June but has not had follow up for results. Educated on calling the specialist for follow up for results and recommendations. 02-23-2020: The patient verbalized he is frustrated as he has not heard back from his colonoscopy and does not know the findings. The patient verbalized  he had called the office but did not receive a return call. Explained the GI provider office was different than [facility] but would send a message to see if they could reach out to him to provide him with his requested results. The patient verbalized understanding. The [name] will send an in basket message for Dr. Lucilla Lame.  The [name] did call Dr. Maudie Mercury office and left a detailed message for the nurse for Dr.  Allen Norris asking the nurse to follow up with the patient concerning his colonoscopy results.   Assessed patient understanding of disease states.  02-23-2020: The patient feels he needs to come in and see the provider sooner than 03-24-2020.  The patient feels like all he gets is phone calls and would like to see the provider face to face. Empathetic listening and support given. Will send a message to the [facility] administrative staff asking for a call to the patient to get a sooner appointment than 03-24-2020.   Assessed patient's education and care coordination needs.  02-23-2020: The patient expressed he just wants to do what he needs to do to stay healthy for his 75 year old child and himself. Emotional support provided.   Provided disease specific education to patient.  02-23-2020: Education on a heart healthy/ADA diet. The patient denies any issues with dietary restrictions at this time.   Collaborated with appropriate clinical care team members regarding patient needs. LCSW for help with anxiety and smoking cessation, Pharmacist for help with medication reconciliation and possible what would help with smoking cessation.   Patient Self Care Activities related to HTN and GERD with new onset of Nausea and Vomiting :   Patient is unable to independently self-manage chronic health conditions  Please see past updates related to this goal by clicking on the "Past Updates" button in the selected goal        [name]: Pt-"I only check my blood sugar once or twice every two weeks" (pt-stated)        CARE PLAN ENTRY (see longtitudinal plan of care for additional care plan information)  Objective:  Lab Results  Component Value Date   HGBA1C 6.2 (H) 02/17/2019    Lab Results  Component Value Date   CREATININE 0.86 02/17/2019   CREATININE 0.73 (L) 11/12/2018   CREATININE 0.63 (L) 08/06/2018     No results found for: EGFR  Current Barriers:   Knowledge Deficits related to basic Diabetes pathophysiology  and self care/management  Knowledge Deficits related to medications used for management of diabetes  Does not use cbg meter regularly- checks blood sugars once or twice a week- 02-23-2020: States he does not have a working meter.   Case Manager Clinical Goal(s):  Over the next 120 days, patient will demonstrate improved adherence to prescribed treatment plan for diabetes self care/management as evidenced by:   daily monitoring and recording of CBG or as pcp recommends   adherence to ADA/ carb modified diet  exercise 3 days/week  adherence to prescribed medication regimen  Interventions:   Provided education to patient about basic DM disease process.  02-23-2020: Education on monitoring DM closer.   Reviewed medications with patient and discussed importance of medication adherence.  The patient endorses compliance.  02-23-2020: The patient endorses compliance with medication regimen.   Discussed plans with patient for ongoing care management follow up and provided patient with direct contact information for care management team  Provided patient with written educational materials related to hypo and hyperglycemia and importance of correct  treatment.  02-23-2020: Reviewed with the patient sx/sx of hypo and hyperglycemia and how to detect changes in blood sugar range.   Advised patient, providing education and rationale, to check cbg as prescribed  and record, calling pcp for findings outside established parameters.  The patient states that his blood sugars are doing well and denies any concerns related to glucose readings or dietary restrictions. 02-23-2020: The patient states the last time he took his blood sugar it was in the 110's. States he does not have a working meter. Will in basket pcp and see about getting a working meter for the patient. Also will see about getting an earlier appointment to come in and see the pcp.   Patient Self Care Activities:   UNABLE to independently manage  diabetes effectively  Please see past updates related to this goal by clicking on the "Past Updates" button in the selected goal        [name]: Pt: "I am going to try to quit smoking in 2 weeks" (pt-stated)        [place] (see longtitudinal plan of care for additional care plan information)  Current Barriers:   Tobacco abuse of  > 20 years; currently smoking 1/2 ppd  Previous quit attempts, per the patient he has never tried to quit before. Plans on quitting "cold [place]" in a couple of weeks  Denies smoking within 30 minutes of waking up  Reports triggers to smoke include: health, stressors in life, anxiety  Reports motivation to quit smoking includes: knowing it will help him feel better  On a scale of 1-10, reports MOTIVATION to quit is 8  On a scale of 1-10, reports CONFIDENCE in quitting is 7  Clinical Goal(s):   Over the next 120 days, patient will work with RN Case Pharmacist, hospital Licensed Clinical Social Worker and provider towards tobacco cessation   Interventions:  Evaluation of current treatment plan reviewed.  The patient verbalized he is trying to cut back and quit. Has not been successful. Will work with the CCM team to meet health and wellness goals. 02-23-2020: The patient still has not been successful at quitting smoking. The patient wants to be healthy for his daughter. Will continue to work with the CCM team to help the patient toward smoking cessation.   Provided contact information for [place] Quit Line (1-800-QUIT-NOW). Patient will outreach this group for support.  Discussed plans with patient for ongoing care management follow up and provided patient with direct contact information for care management team  Provided patient with written smoking cessation educational materials through the [place] and EMMI educational system  Referred patient to pharmacy team  Provided contact information for [place] Quit Line (1-800-QUIT-NOW). Patient will outreach this  group for support.   Patient Self Care Activities:   Patient will commit to reducing tobacco consumption  Please see past updates related to this goal by clicking on the "Past Updates" button in the selected goal          Plan:   Telephone follow up appointment with care management team member scheduled for: 04-18-2020 at 0930 am   [place], MSN, [facility] Mobile: [phone]

## 2020-02-23 NOTE — Chronic Care Management (AMB) (Signed)
  Chronic Care Management   Note  02/23/2020 Name: WALDO DAMIAN MRN: [id number] DOB: 1981/02/03  Bertram Savin Bentz is a 39 y.o. year old male who is a primary care patient of Volney American, [place]. TRU RANA is currently enrolled in care management services. An additional referral for Pharm D  was placed.   Follow up plan: Telephone appointment with care management team member scheduled for:03/29/2020  Noreene Larsson, [address], [place], [place] [address] Direct Dial: [phone] [email or web address] [email or web address]

## 2020-02-23 NOTE — Progress Notes (Signed)
Thanks Delene Ruffini get him on my schedule.

## 2020-02-23 NOTE — Patient Instructions (Signed)
Visit Information  Goals Addressed              This Visit's Progress   .  [name]: "My blood pressure is good" (pt-stated)        CARE PLAN ENTRY (see longtitudinal plan of care for additional care plan information)  Current Barriers:  . Chronic Disease Management support, education, and care coordination needs related to HTN and GERD with new onset of nausea and vomiting  Clinical Goal(s) related to HTN and GERD with new onset of nausea and vomiting  :  Over the next 120 days, patient will:  . Work with the care management team to address educational, disease management, and care coordination needs  . Begin or continue self health monitoring activities as directed today Measure and record blood pressure 1 times per week and adhere to a heart healthy/ADA diet . Call provider office for new or worsened signs and symptoms Blood pressure findings outside established parameters and New or worsened symptom related to GERD/Nausea/vomiting . Call care management team with questions or concerns . Verbalize basic understanding of patient centered plan of care established today  Interventions related to HTN and GERD with new onset of nausea and vomiting  :  . Evaluation of current treatment plans and patient's adherence to plan as established by provider.  The patient had a colonoscopy in June but has not had follow up for results. Educated on calling the specialist for follow up for results and recommendations. 02-23-2020: The patient verbalized he is frustrated as he has not heard back from his colonoscopy and does not know the findings. The patient verbalized he had called the office but did not receive a return call. Explained the GI provider office was different than [facility] but would send a message to see if they could reach out to him to provide him with his requested results. The patient verbalized understanding. The [name] will send an in basket message for Dr. Lucilla Lame.  The [name] did call Dr.  Maudie Mercury office and left a detailed message for the nurse for Dr. Allen Norris asking the nurse to follow up with the patient concerning his colonoscopy results.  . Assessed patient understanding of disease states.  02-23-2020: The patient feels he needs to come in and see the provider sooner than 03-24-2020.  The patient feels like all he gets is phone calls and would like to see the provider face to face. Empathetic listening and support given. Will send a message to the [facility] administrative staff asking for a call to the patient to get a sooner appointment than 03-24-2020.  . Assessed patient's education and care coordination needs.  02-23-2020: The patient expressed he just wants to do what he needs to do to stay healthy for his 12 year old child and himself. Emotional support provided.  . Provided disease specific education to patient.  02-23-2020: Education on a heart healthy/ADA diet. The patient denies any issues with dietary restrictions at this time.  Nash Dimmer with appropriate clinical care team members regarding patient needs. LCSW for help with anxiety and smoking cessation, Pharmacist for help with medication reconciliation and possible what would help with smoking cessation.   Patient Self Care Activities related to HTN and GERD with new onset of Nausea and Vomiting :  . Patient is unable to independently self-manage chronic health conditions  Please see past updates related to this goal by clicking on the "Past Updates" button in the selected goal      .  [name]: Pt-"I only check my blood sugar once or twice every two weeks" (pt-stated)        CARE PLAN ENTRY (see longtitudinal plan of care for additional care plan information)  Objective:  Lab Results  Component Value Date   HGBA1C 6.2 (H) 02/17/2019 .   Lab Results  Component Value Date   CREATININE 0.86 02/17/2019   CREATININE 0.73 (L) 11/12/2018   CREATININE 0.63 (L) 08/06/2018 .   Marland Kitchen No results found for: EGFR  Current  Barriers:  Marland Kitchen Knowledge Deficits related to basic Diabetes pathophysiology and self care/management . Knowledge Deficits related to medications used for management of diabetes . Does not use cbg meter regularly- checks blood sugars once or twice a week- 02-23-2020: States he does not have a working meter.   Case Manager Clinical Goal(s):  Over the next 120 days, patient will demonstrate improved adherence to prescribed treatment plan for diabetes self care/management as evidenced by:  . daily monitoring and recording of CBG or as pcp recommends  . adherence to ADA/ carb modified diet . exercise 3 days/week . adherence to prescribed medication regimen  Interventions:  . Provided education to patient about basic DM disease process.  02-23-2020: Education on monitoring DM closer.  . Reviewed medications with patient and discussed importance of medication adherence.  The patient endorses compliance.  02-23-2020: The patient endorses compliance with medication regimen.  . Discussed plans with patient for ongoing care management follow up and provided patient with direct contact information for care management team . Provided patient with written educational materials related to hypo and hyperglycemia and importance of correct treatment.  02-23-2020: Reviewed with the patient sx/sx of hypo and hyperglycemia and how to detect changes in blood sugar range.  . Advised patient, providing education and rationale, to check cbg as prescribed  and record, calling pcp for findings outside established parameters.  The patient states that his blood sugars are doing well and denies any concerns related to glucose readings or dietary restrictions. 02-23-2020: The patient states the last time he took his blood sugar it was in the 110's. States he does not have a working meter. Will in basket pcp and see about getting a working meter for the patient. Also will see about getting an earlier appointment to come in and see the  pcp.   Patient Self Care Activities:  . UNABLE to independently manage diabetes effectively  Please see past updates related to this goal by clicking on the "Past Updates" button in the selected goal      .  [name]: Pt: "I am going to try to quit smoking in 2 weeks" (pt-stated)        [place] (see longtitudinal plan of care for additional care plan information)  Current Barriers:  . Tobacco abuse of  > 20 years; currently smoking 1/2 ppd . Previous quit attempts, per the patient he has never tried to quit before. Plans on quitting "cold [place]" in a couple of weeks . Denies smoking within 30 minutes of waking up . Reports triggers to smoke include: health, stressors in life, anxiety . Reports motivation to quit smoking includes: knowing it will help him feel better . On a scale of 1-10, reports MOTIVATION to quit is 8 . On a scale of 1-10, reports CONFIDENCE in quitting is 7  Clinical Goal(s):  Marland Kitchen Over the next 120 days, patient will work with Chief Operating Officer and provider towards tobacco cessation  Interventions: . Evaluation of current treatment plan reviewed.  The patient verbalized he is trying to cut back and quit. Has not been successful. Will work with the CCM team to meet health and wellness goals. 02-23-2020: The patient still has not been successful at quitting smoking. The patient wants to be healthy for his daughter. Will continue to work with the CCM team to help the patient toward smoking cessation.  . Provided contact information for [place] Quit Line (1-800-QUIT-NOW). Patient will outreach this group for support. . Discussed plans with patient for ongoing care management follow up and provided patient with direct contact information for care management team . Provided patient with written smoking cessation educational materials through the [place] and EMMI educational system . Referred patient to pharmacy team . Provided contact  information for [place] Quit Line (1-800-QUIT-NOW). Patient will outreach this group for support.   Patient Self Care Activities:  . Patient will commit to reducing tobacco consumption  Please see past updates related to this goal by clicking on the "Past Updates" button in the selected goal         Patient verbalizes understanding of instructions provided today.   Telephone follow up appointment with care management team member scheduled for: 04-18-2020 at 0930 am  Noreene Larsson RN, MSN, [facility] Mobile: [phone]

## 2020-02-23 NOTE — Progress Notes (Signed)
Pt has been scheduled for 03/29/2020

## 2020-02-24 ENCOUNTER — Telehealth: Payer: Self-pay

## 2020-02-24 NOTE — Telephone Encounter (Signed)
Called pt today regarding receiving a message from his PCP's office about his colonoscopy/EGD results. Pt's procedures were done on 11/15/2019. A results letter was mailed to pt on 11/16/2019 to the address listed on pt's chart. When I called the pt today to discuss this, he stopped me saying he was getting his hair cut and would call me back. Then immediately hung up.

## 2020-03-01 NOTE — Telephone Encounter (Signed)
Mailed pt's result letter again.

## 2020-03-06 ENCOUNTER — Ambulatory Visit (INDEPENDENT_AMBULATORY_CARE_PROVIDER_SITE_OTHER): Payer: Medicare Other | Admitting: Licensed Clinical Social Worker

## 2020-03-06 DIAGNOSIS — I10 Essential (primary) hypertension: Secondary | ICD-10-CM

## 2020-03-06 DIAGNOSIS — F419 Anxiety disorder, unspecified: Secondary | ICD-10-CM

## 2020-03-06 DIAGNOSIS — E1165 Type 2 diabetes mellitus with hyperglycemia: Secondary | ICD-10-CM | POA: Diagnosis not present

## 2020-03-06 NOTE — Chronic Care Management (AMB) (Signed)
  Chronic Care Management    Clinical Social Work Follow Up Note  03/06/2020 Name: LUKE FALERO MRN: [id number] DOB: 01-Aug-1980  [name] is a 39 y.o. year old male who is a primary care patient of Volney American, [place]. The CCM team was consulted for assistance with Mental Health Counseling and Resources in regard to stress management.   Review of patient status, including review of consultants reports, other relevant assessments, and collaboration with appropriate care team members and the patient's provider was performed as part of comprehensive patient evaluation and provision of chronic care management services.    SDOH (Social Determinants of Health) assessments performed: Yes    Outpatient Encounter Medications as of 03/06/2020  Medication Sig Note  . dexlansoprazole (DEXILANT) 60 MG capsule Take 1 capsule (60 mg total) by mouth daily.   Marland Kitchen glipiZIDE (GLUCOTROL) 5 MG tablet Take 1 tablet (5 mg total) by mouth 2 (two) times daily before a meal. (Patient not taking: Reported on 11/08/2019)   . sildenafil (VIAGRA) 100 MG tablet Take 0.5-1 tablets (50-100 mg total) by mouth daily as needed for erectile dysfunction. (Patient not taking: Reported on 10/14/2019) 02/17/2019: Only As Needed  . sucralfate (CARAFATE) 1 g tablet Take 1 tablet (1 g total) by mouth 4 (four) times daily -  with meals and at bedtime. (Patient not taking: Reported on 10/14/2019)   . TRULICITY 6.14 ER/1.5QM SOPN INJECT 0.75 MG INTO THE SKIN ONCE A WEEK.    No facility-administered encounter medications on file as of 03/06/2020.     Goals Addressed    . Track and Manage My Symptoms       Follow Up Date- next quarter   - avoid negative self-talk - develop a personal safety plan - develop a plan to deal with triggers like holidays, anniversaries - exercise at least 2 to 3 times per week - have a plan for how to handle bad days - journal feelings and what helps to feel better or worse - spend time or  talk with others at least 2 to 3 times per week - spend time or talk with others every day - watch for early signs of feeling worse - write in journal every day    Why is this important?   Keeping track of your progress will help your treatment team find the right mix of medicine and therapy for you.  Write in your journal every day.  Day-to-day changes in depression symptoms are normal. It may be more helpful to check your progress at the end of each week instead of every day.     Notes:      Follow Up Plan: SW will follow up with patient by phone over the next quarter  Eula Fried, BSW, MSW, [email or web address] Phone: [phone]

## 2020-03-15 ENCOUNTER — Encounter: Payer: Self-pay | Admitting: Family Medicine

## 2020-03-15 ENCOUNTER — Other Ambulatory Visit: Payer: Self-pay

## 2020-03-15 ENCOUNTER — Ambulatory Visit (INDEPENDENT_AMBULATORY_CARE_PROVIDER_SITE_OTHER): Payer: Medicare Other | Admitting: Family Medicine

## 2020-03-15 VITALS — BP 137/75 | HR 83 | Temp 98.1°F | Wt 252.8 lb

## 2020-03-15 DIAGNOSIS — I10 Essential (primary) hypertension: Secondary | ICD-10-CM | POA: Diagnosis not present

## 2020-03-15 DIAGNOSIS — E1165 Type 2 diabetes mellitus with hyperglycemia: Secondary | ICD-10-CM | POA: Diagnosis not present

## 2020-03-15 DIAGNOSIS — Z720 Tobacco use: Secondary | ICD-10-CM | POA: Diagnosis not present

## 2020-03-15 DIAGNOSIS — Z1159 Encounter for screening for other viral diseases: Secondary | ICD-10-CM | POA: Diagnosis not present

## 2020-03-15 DIAGNOSIS — K219 Gastro-esophageal reflux disease without esophagitis: Secondary | ICD-10-CM | POA: Diagnosis not present

## 2020-03-15 LAB — URINALYSIS, ROUTINE W REFLEX MICROSCOPIC
Bilirubin, UA: NEGATIVE
Ketones, UA: NEGATIVE
Leukocytes,UA: NEGATIVE
Nitrite, UA: NEGATIVE
Protein,UA: NEGATIVE
RBC, UA: NEGATIVE
Specific Gravity, UA: 1.02 (ref 1.005–1.030)
Urobilinogen, Ur: 1 mg/dL (ref 0.2–1.0)
pH, UA: 5.5 (ref 5.0–7.5)

## 2020-03-15 LAB — MICROALBUMIN, URINE WAIVED
Creatinine, Urine Waived: 200 mg/dL (ref 10–300)
Microalb, Ur Waived: 30 mg/L — ABNORMAL HIGH (ref 0–19)
Microalb/Creat Ratio: <30 mg/g (ref <30)

## 2020-03-15 LAB — BAYER DCA HB A1C WAIVED: HB A1C (BAYER DCA - WAIVED): 7.6 % — ABNORMAL HIGH (ref <7.0)

## 2020-03-15 MED ORDER — TRULICITY 1.5 MG/0.5ML ~~LOC~~ SOAJ
1.5000 mg | SUBCUTANEOUS | 1 refills | Status: DC
Start: 2020-03-15 — End: 2020-09-14

## 2020-03-15 MED ORDER — DEXILANT 60 MG PO CPDR: 60.0000 mg | DELAYED_RELEASE_CAPSULE | Freq: Every day | ORAL | 1 refills | Status: DC

## 2020-03-15 NOTE — Assessment & Plan Note (Signed)
Under good control on current regimen. Continue current regimen. Continue to monitor. Call with any concerns. Refills given. Labs drawn today.   

## 2020-03-15 NOTE — Assessment & Plan Note (Signed)
Elevated at 7.6 up from 6.6. Will increase his trulicity to 1.5mg  weekly and recheck 6 months. Call with any concerns.

## 2020-03-15 NOTE — Assessment & Plan Note (Signed)
Labs drawn today. Await results. Treat as needed.  

## 2020-03-15 NOTE — Progress Notes (Signed)
BP 137/75   Pulse 83   Temp 98.1 F (36.7 C) (Oral)   Wt 252 lb 12.8 oz (114.7 kg)   SpO2 98%   BMI 32.46 kg/m    Subjective:    Patient ID: Steve [name], male    DOB: May 09, 1981, 39 y.o.   MRN: [id number]  HPI: Steve [name] is a 39 y.o. male  Chief Complaint  Patient presents with  . Diabetes  . Gastroesophageal Reflux   DIABETES Hypoglycemic episodes:no Polydipsia/polyuria: no Visual disturbance: yes Chest pain: no Paresthesias: yes Glucose Monitoring: no  Accucheck frequency: Not Checking Taking Insulin?: no Blood Pressure Monitoring: not checking Retinal Examination: Not up to Date Foot Exam: Done today Diabetic Education: Not Completed Pneumovax: Declined Influenza: Declined Aspirin: no  HYPERTENSION Hypertension status: controlled  Satisfied with current treatment? yes Duration of hypertension: chronic BP monitoring frequency:  not checking BP medication side effects:  no Medication compliance:  Not on anything Previous BP meds: none Aspirin: no Recurrent headaches: no Visual changes: yes Palpitations: no Dyspnea: no Chest pain: no Lower extremity edema: no Dizzy/lightheaded: no  GERD- has been feeling better since using the miralax, has been using dexilant GERD control status: controlled  Satisfied with current treatment? yes Medication side effects: no  Medication compliance: excellent Dysphagia: no Odynophagia:  no Hematemesis: no Blood in stool: no EGD: no   Relevant past medical, surgical, family and social history reviewed and updated as indicated. Interim medical history since our last visit reviewed. Allergies and medications reviewed and updated.  Review of Systems  Constitutional: Negative.   Respiratory: Negative.   Cardiovascular: Negative.   Gastrointestinal: Negative.   Musculoskeletal: Negative.   Neurological: Negative.   Psychiatric/Behavioral: Negative.     Per HPI unless specifically indicated  above     Objective:    BP 137/75   Pulse 83   Temp 98.1 F (36.7 C) (Oral)   Wt 252 lb 12.8 oz (114.7 kg)   SpO2 98%   BMI 32.46 kg/m   Wt Readings from Last 3 Encounters:  03/15/20 252 lb 12.8 oz (114.7 kg)  11/15/19 242 lb (109.8 kg)  10/14/19 246 lb 12.8 oz (111.9 kg)    Physical Exam Vitals and nursing note reviewed.  Constitutional:      General: He is not in acute distress.    Appearance: Normal appearance. He is not ill-appearing, toxic-appearing or diaphoretic.  HENT:     Head: Normocephalic and atraumatic.     Right Ear: External ear normal.     Left Ear: External ear normal.     Nose: Nose normal.     Mouth/Throat:     Mouth: Mucous membranes are moist.     Pharynx: Oropharynx is clear.  Eyes:     General: No scleral icterus.       Right eye: No discharge.        Left eye: No discharge.     Extraocular Movements: Extraocular movements intact.     Conjunctiva/sclera: Conjunctivae normal.     Pupils: Pupils are equal, round, and reactive to light.  Cardiovascular:     Rate and Rhythm: Normal rate and regular rhythm.     Pulses: Normal pulses.     Heart sounds: Normal heart sounds. No murmur heard.  No friction rub. No gallop.   Pulmonary:     Effort: Pulmonary effort is normal. No respiratory distress.     Breath sounds: Normal breath sounds. No stridor. No wheezing, rhonchi or rales.  Chest:     Chest wall: No tenderness.  Musculoskeletal:        General: Normal range of motion.     Cervical back: Normal range of motion and neck supple.  Skin:    General: Skin is warm and dry.     Capillary Refill: Capillary refill takes less than 2 seconds.     Coloration: Skin is not jaundiced or pale.     Findings: No bruising, erythema, lesion or rash.  Neurological:     General: No focal deficit present.     Mental Status: He is alert and oriented to person, place, and time. Mental status is at baseline.  Psychiatric:        Mood and Affect: Mood normal.         Behavior: Behavior normal.        Thought Content: Thought content normal.        Judgment: Judgment normal.     Results for orders placed or performed during the hospital encounter of 11/15/19  Glucose, capillary  Result Value Ref Range   Glucose-Capillary 107 (H) 70 - 99 mg/dL  Glucose, capillary  Result Value Ref Range   Glucose-Capillary 110 (H) 70 - 99 mg/dL  Surgical pathology  Result Value Ref Range   SURGICAL PATHOLOGY      SURGICAL PATHOLOGY CASE: [id number] PATIENT: Eduard Roux Surgical Pathology Report     Specimen Submitted: A. Duodenum; cbx B. Duodenum polyp; cbx C. Colon biopsies x2, random; cbx D. Colon polyp x2, sigmoid; cbx  Clinical History: Change in bowel habits R19.4 nausea and vomiting R11.2.  Duodenal polyps, colon polyps    DIAGNOSIS: A. DUODENUM; COLD BIOPSY: - ENTERIC MUCOSA WITH PRESERVED VILLOUS ARCHITECTURE AND NO SIGNIFICANT HISTOPATHOLOGIC CHANGE. - NEGATIVE FOR FEATURES OF CELIAC, DYSPLASIA, AND MALIGNANCY.  B. DUODENAL POLYP; COLD BIOPSY: - GASTRIC HETEROTOPIA. - NEGATIVE FOR DYSPLASIA AND MALIGNANCY.  C. COLON, RANDOM; COLD BIOPSIES: - FRAGMENTS OF BENIGN COLONIC MUCOSA WITH FOCAL SUPERFICIAL REACTIVE CHANGES AND SMALL LYMPHOID AGGREGATES. - NEGATIVE FOR DYSPLASIA AND MALIGNANCY.  D. COLON POLYPS X2, SIGMOID; COLD BIOPSY: - SINGLE TUBULAR ADENOMA. - SINGLE HYPERPLASTIC POLYP. - NEGATIVE FOR HIGH-GRADE DYSPLASIA AND MALIGNANCY.  GROSS  DESCRIPTION: A. Labeled: Duodenal biopsy for celiac sprue-CB Received: In formalin Tissue fragment(s): 3 Size: 0.7 x 0.3 x 0.1 cm Description: Aggregate of pink-tan tissue fragments Entirely submitted in 1 cassette.  B. Labeled: Duodenal polyp- CB Received: In formalin Tissue fragment(s): 1 Size: 0.2 cm Description: Tan tissue fragment Entirely submitted in 1 cassette.  C. Labeled: Random colon biopsy x2-CB Received: In formalin Tissue fragment(s): Multiple Size: 1 x  0.5 x 0.1 cm Description: Aggregate of tan tissue fragments Entirely submitted in 1 cassette.  D. Labeled: Sigmoid colon polyp x2-CB Received: In formalin Tissue fragment(s): 2 Size: 0.6 x 0.3 x 0.1 cm Description: Aggregate of tan tissue fragments Entirely submitted in 1 cassette.   Final Diagnosis performed by Allena Napoleon, MD.   Electronically signed 11/16/2019 11:18:31AM The electronic signature indicates that the named Attending Pathologist has evaluated the specimen Technical component  performed at [facility], [address], [place], [place] [address] Lab: [phone] Dir: Rush Farmer, MD, MMM  Professional component performed at [facility], [facility], [place], [place], [place] [address] Lab: [phone] Dir: Dellia Nims. Reuel Derby, MD       Assessment & Plan:   Problem List Items Addressed This Visit      Cardiovascular and Mediastinum   Hypertension    Under good  control on current regimen. Continue current regimen. Continue to monitor. Call with any concerns. Continue to monitor. Declines ACE for DM.      Relevant Orders   CBC with Differential/Platelet   TSH     Digestive   Gastroesophageal reflux disease    Under good control on current regimen. Continue current regimen. Continue to monitor. Call with any concerns. Refills given. Labs drawn today.       Relevant Medications   dexlansoprazole (DEXILANT) 60 MG capsule     Endocrine   Type 2 diabetes mellitus with hyperglycemia, without long-term current use of insulin (HCC) - Primary    Elevated at 7.6 up from 6.6. Will increase his trulicity to 1.5mg  weekly and recheck 6 months. Call with any concerns.       Relevant Medications   Dulaglutide (TRULICITY) 1.5 YC/1.4GY SOPN   Other Relevant Orders   Bayer DCA Hb A1c Waived   CBC with Differential/Platelet   Comprehensive metabolic panel   Lipid Panel w/o Chol/HDL Ratio   Microalbumin, Urine Waived   Ambulatory referral to  Ophthalmology     Other   Tobacco use    Labs drawn today. Await results. Treat as needed.       Relevant Orders   CBC with Differential/Platelet   Urinalysis, Routine w reflex microscopic    Other Visit Diagnoses    Encounter for hepatitis C screening test for low risk patient       Labs drawn today. Await results.   Relevant Orders   Hepatitis C antibody       Follow up plan: Return in about 6 months (around 09/13/2020).

## 2020-03-15 NOTE — Assessment & Plan Note (Signed)
Under good control on current regimen. Continue current regimen. Continue to monitor. Call with any concerns. Continue to monitor. Declines ACE for DM.

## 2020-03-16 LAB — CBC WITH DIFFERENTIAL/PLATELET
Basophils Absolute: 0 10*3/uL (ref 0.0–0.2)
Basos: 1 %
EOS (ABSOLUTE): 0.1 10*3/uL (ref 0.0–0.4)
Eos: 3 %
Hematocrit: 42.6 % (ref 37.5–51.0)
Hemoglobin: 14.4 g/dL (ref 13.0–17.7)
Immature Grans (Abs): 0 10*3/uL (ref 0.0–0.1)
Immature Granulocytes: 0 %
Lymphocytes Absolute: 1.2 10*3/uL (ref 0.7–3.1)
Lymphs: 34 %
MCH: 28.1 pg (ref 26.6–33.0)
MCHC: 33.8 g/dL (ref 31.5–35.7)
MCV: 83 fL (ref 79–97)
Monocytes Absolute: 0.4 10*3/uL (ref 0.1–0.9)
Monocytes: 11 %
Neutrophils Absolute: 1.9 10*3/uL (ref 1.4–7.0)
Neutrophils: 51 %
Platelets: 131 10*3/uL — ABNORMAL LOW (ref 150–450)
RBC: 5.12 x10E6/uL (ref 4.14–5.80)
RDW: 11.9 % (ref 11.6–15.4)
WBC: 3.6 10*3/uL (ref 3.4–10.8)

## 2020-03-16 LAB — COMPREHENSIVE METABOLIC PANEL
ALT: 32 IU/L (ref 0–44)
AST: 15 IU/L (ref 0–40)
Albumin/Globulin Ratio: 1.8 (ref 1.2–2.2)
Albumin: 4.2 g/dL (ref 4.0–5.0)
Alkaline Phosphatase: 78 IU/L (ref 44–121)
BUN/Creatinine Ratio: 9 (ref 9–20)
BUN: 8 mg/dL (ref 6–20)
Bilirubin Total: 0.5 mg/dL (ref 0.0–1.2)
CO2: 23 mmol/L (ref 20–29)
Calcium: 9.5 mg/dL (ref 8.7–10.2)
Chloride: 103 mmol/L (ref 96–106)
Creatinine, Ser: 0.87 mg/dL (ref 0.76–1.27)
GFR calc Af Amer: 126 mL/min/{1.73_m2} (ref >59)
GFR calc non Af Amer: 109 mL/min/{1.73_m2} (ref >59)
Globulin, Total: 2.4 g/dL (ref 1.5–4.5)
Glucose: 204 mg/dL — ABNORMAL HIGH (ref 65–99)
Potassium: 4.2 mmol/L (ref 3.5–5.2)
Sodium: 138 mmol/L (ref 134–144)
Total Protein: 6.6 g/dL (ref 6.0–8.5)

## 2020-03-16 LAB — TSH: TSH: 1.95 u[IU]/mL (ref 0.450–4.500)

## 2020-03-16 LAB — LIPID PANEL W/O CHOL/HDL RATIO
Cholesterol, Total: 250 mg/dL — ABNORMAL HIGH (ref 100–199)
HDL: 26 mg/dL — ABNORMAL LOW (ref >39)
LDL Chol Calc (NIH): 150 mg/dL — ABNORMAL HIGH (ref 0–99)
Triglycerides: 393 mg/dL — ABNORMAL HIGH (ref 0–149)
VLDL Cholesterol Cal: 74 mg/dL — ABNORMAL HIGH (ref 5–40)

## 2020-03-16 LAB — HEPATITIS C ANTIBODY: Hep C Virus Ab: <0.1 s/co ratio (ref 0.0–0.9)

## 2020-03-24 ENCOUNTER — Ambulatory Visit: Payer: Medicare Other | Admitting: Family Medicine

## 2020-03-29 ENCOUNTER — Ambulatory Visit: Payer: Medicare Other | Admitting: Pharmacist

## 2020-03-29 DIAGNOSIS — Z720 Tobacco use: Secondary | ICD-10-CM

## 2020-03-29 DIAGNOSIS — E1165 Type 2 diabetes mellitus with hyperglycemia: Secondary | ICD-10-CM

## 2020-03-29 NOTE — Patient Instructions (Addendum)
Visit Information  It was a pleasure speaking with you today! Thank you for letting me be a part of your care team. Please call with any questions or concerns.  Goals Addressed            This Visit's Progress   . Pharmacy Care Plan       CARE PLAN ENTRY (see longitudinal plan of care for additional care plan information)  Current Barriers:  . Chronic Disease Management support, education, and care coordination needs related to Hypertension, Diabetes, GERD, and Tobacco use    Diabetes Lab Results  Component Value Date/Time   HGBA1C 7.6 (H) 03/15/2020 09:32 AM   HGBA1C 6.2 (H) 02/17/2019 04:47 PM   HGBA1C 10.9 (H) 11/12/2018 10:18 AM   . Pharmacist Clinical Goal(s): o Over the next 90 days, patient will work with PharmD and providers to achieve A1c goal <7% . Current regimen:  o Trulicity 3 mg weekly  . Interventions: o Will collaborate with prescriber to have new meter, strips [facility] o Encouraged patient to pick up meter and begin checking at home. . Patient self care activities - Over the next 90 days, patient will: o Check blood sugar twice daily, document, and provide at future appointments o Contact provider with any episodes of hypoglycemia  GERD . Pharmacist Clinical Goal(s) o Over the next 90 days, patient will work with PharmD and providers to minimize symptoms . Current regimen:  o Dexilant 60 mg daily . Interventions: o Provided diet and exercise counseling.  o Verified patient received egd/colonscopy results . Patient self care activities - Over the next 90 days, patient will: o Continue identifying and minimizing triggers  Medication management . Pharmacist Clinical Goal(s): o Over the next 90 days, patient will work with PharmD and providers to achieve optimal medication adherence . [facility] . Interventions o Comprehensive medication review performed. o Continue current medication management  strategy . Patient self care activities - Over the next 90 days, patient will: o Focus on medication adherence by fill dates o Take medications as prescribed o Report any questions or concerns to PharmD and/or provider(s)  Initial goal documentation        Steve [name] was given information about Chronic Care Management services today including:  1. CCM service includes personalized support from designated clinical staff supervised by his physician, including individualized plan of care and coordination with other care providers 2. 24/7 contact phone numbers for assistance for urgent and routine care needs. 3. Standard insurance, coinsurance, copays and deductibles apply for chronic care management only during months in which we provide at least 20 minutes of these services. Most insurances cover these services at 100%, however patients may be responsible for any copay, coinsurance and/or deductible if applicable. This service may help you avoid the need for more expensive face-to-face services. 4. Only one practitioner may furnish and bill the service in a calendar month. 5. The patient may stop CCM services at any time (effective at the end of the month) by phone call to the office staff.  Patient agreed to services and verbal consent obtained.   The patient verbalized understanding of instructions provided today and agreed to receive a mailed copy of patient instruction and/or educational materials. Telephone follow up appointment with pharmacy team member scheduled for: 1 month  Follow up with PharmD in 3 months  Junita Push. Kenton Kingfisher PharmD, BCPS Clinical Pharmacist [phone]  Smoking Tobacco Information, Adult Smoking tobacco can be  harmful to your health. Tobacco contains a poisonous (toxic), colorless chemical called nicotine. Nicotine is addictive. It changes the brain and can make it hard to stop smoking. Tobacco also has other toxic chemicals that can hurt your body and raise your  risk of many cancers. How can smoking tobacco affect me? Smoking tobacco puts you at risk for:  Cancer. Smoking is most commonly associated with lung cancer, but can also lead to cancer in other parts of the body.  Chronic obstructive pulmonary disease (COPD). This is a long-term lung condition that makes it hard to breathe. It also gets worse over time.  High blood pressure (hypertension), heart disease, stroke, or heart attack.  Lung infections, such as pneumonia.  Cataracts. This is when the lenses in the eyes become clouded.  Digestive problems. This may include peptic ulcers, heartburn, and gastroesophageal reflux disease (GERD).  Oral health problems, such as gum disease and tooth loss.  Loss of taste and smell. Smoking can affect your appearance by causing:  Wrinkles.  Yellow or stained teeth, fingers, and fingernails. Smoking tobacco can also affect your social life, because:  It may be challenging to find places to smoke when away from home. Many workplaces, [employer], hotels, and public places are tobacco-free.  Smoking is expensive. This is due to the cost of tobacco and the long-term costs of treating health problems from smoking.  Secondhand smoke may affect those around you. Secondhand smoke can cause lung cancer, breathing problems, and heart disease. Children of smokers have a higher risk for: ? Sudden infant death syndrome (SIDS). ? Ear infections. ? Lung infections. If you currently smoke tobacco, quitting now can help you:  Lead a longer and healthier life.  Look, smell, breathe, and feel better over time.  Save money.  Protect others from the harms of secondhand smoke. What actions can I take to prevent health problems? Quit smoking   Do not start smoking. Quit if you already do.  Make a plan to quit smoking and commit to it. Look for programs to help you and ask your health care provider for recommendations and ideas.  Set a date and write  down all the reasons you want to quit.  Let your friends and family know you are quitting so they can help and support you. Consider finding friends who also want to quit. It can be easier to quit with someone else, so that you can support each other.  Talk with your health care provider about using nicotine replacement medicines to help you quit, such as gum, lozenges, patches, sprays, or pills.  Do not replace cigarette smoking with electronic cigarettes, which are commonly called e-cigarettes. The safety of e-cigarettes is not known, and some may contain harmful chemicals.  If you try to quit but return to smoking, stay positive. It is common to slip up when you first quit, so take it one day at a time.  Be prepared for cravings. When you feel the urge to smoke, chew gum or suck on hard candy. Lifestyle  Stay busy and take care of your body.  Drink enough fluid to keep your urine pale yellow.  Get plenty of exercise and eat a healthy diet. This can help prevent weight gain after quitting.  Monitor your eating habits. Quitting smoking can cause you to have a larger appetite than when you smoke.  Find ways to relax. Go out with friends or family to a movie or a restaurant where people do not smoke.  Ask  your health care provider about having regular tests (screenings) to check for cancer. This may include blood tests, imaging tests, and other tests.  Find ways to manage your stress, such as meditation, yoga, or exercise. Where to find support To get support to quit smoking, consider:  Asking your health care provider for more information and resources.  Taking classes to learn more about quitting smoking.  Looking for local organizations that offer resources about quitting smoking.  Joining a support group for people who want to quit smoking in your local community.  Calling the smokefree.gov counselor helpline: 1-800-Quit-Now 609-873-0327) Where to find more  information You may find more information about quitting smoking from:  HelpGuide.org: www.helpguide.org  https://hall.com/: smokefree.gov  American Lung Association: www.lung.org Contact a health care provider if you:  Have problems breathing.  Notice that your lips, nose, or fingers turn blue.  Have chest pain.  Are coughing up blood.  Feel faint or you pass out.  Have other health changes that cause you to worry. Summary  Smoking tobacco can negatively affect your health, the health of those around you, your finances, and your social life.  Do not start smoking. Quit if you already do. If you need help quitting, ask your health care provider.  Think about joining a support group for people who want to quit smoking in your local community. There are many effective programs that will help you to quit this behavior. This information is not intended to replace advice given to you by your health care provider. Make sure you discuss any questions you have with your health care provider. Document Revised: 02/12/2019 Document Reviewed: 06/04/2016 Elsevier Patient Education  2020 [employer].

## 2020-03-29 NOTE — Progress Notes (Signed)
Chronic Care Management Pharmacy  Name: Steve [name]  MRN: [id number] DOB: 04/07/1981   Chief Complaint/ HPI  [name],  39 y.o. , male presents for their Initial CCM visit with the clinical pharmacist via telephone.  PCP : Volney American, PA-C Patient Care Team: Volney American, PA-C as PCP - General (Family Medicine) Vanita Ingles, RN as Case Manager ([place]) Greg Cutter, LCSW as Social Worker (Licensed Clinical Social Worker) Vladimir Faster, [facility] (Pharmacist)  Their chronic conditions include: Hypertension, Diabetes, GERD and Tobacco use   Office Visits: 03/15/20- Dr. Wynetta Emery - Trulicity increased to 1.5 mg weekly, recheck 6 months, lipid panel elevated 09/14/19 - Merrie Roof, PA - epigastric pain- dexilant   Consult Visit: 6/14/210- GI - EGD, colonoscopy - biopsy of duodenal polyp of stomach, two descending colon polyps removed allresults negative  No Known Allergies  Medications: Outpatient Encounter Medications as of 03/29/2020  Medication Sig  . dexlansoprazole (DEXILANT) 60 MG capsule Take 1 capsule (60 mg total) by mouth daily.  . Dulaglutide (TRULICITY) 1.5 WI/0.9BD SOPN Inject 1.5 mg into the skin once a week.   No facility-administered encounter medications on file as of 03/29/2020.    Wt Readings from Last 3 Encounters:  03/15/20 252 lb 12.8 oz (114.7 kg)  11/15/19 242 lb (109.8 kg)  10/14/19 246 lb 12.8 oz (111.9 kg)        Goals Addressed            This Visit's Progress   . Pharmacy Care Plan       CARE PLAN ENTRY (see longitudinal plan of care for additional care plan information)  Current Barriers:  . Chronic Disease Management support, education, and care coordination needs related to Hypertension, Diabetes, GERD, and Tobacco use    Diabetes Lab Results  Component Value Date/Time   HGBA1C 7.6 (H) 03/15/2020 09:32 AM   HGBA1C 6.2 (H) 02/17/2019 04:47 PM   HGBA1C 10.9 (H) 11/12/2018 10:18  AM   . Pharmacist Clinical Goal(s): o Over the next 90 days, patient will work with PharmD and providers to achieve A1c goal <7% . Current regimen:  o Trulicity 3 mg weekly  . Interventions: o Will collaborate with prescriber to have new meter, strips [facility] o Encouraged patient to pick up meter and begin checking at home. . Patient self care activities - Over the next 90 days, patient will: o Check blood sugar twice daily, document, and provide at future appointments o Contact provider with any episodes of hypoglycemia  GERD . Pharmacist Clinical Goal(s) o Over the next 90 days, patient will work with PharmD and providers to minimize symptoms . Current regimen:  o Dexilant 60 mg daily . Interventions: o Provided diet and exercise counseling.  o Verified patient received egd/colonscopy results . Patient self care activities - Over the next 90 days, patient will: o Continue identifying and minimizing triggers  Medication management . Pharmacist Clinical Goal(s): o Over the next 90 days, patient will work with PharmD and providers to achieve optimal medication adherence . [facility] . Interventions o Comprehensive medication review performed. o Continue current medication management strategy . Patient self care activities - Over the next 90 days, patient will: o Focus on medication adherence by fill dates o Take medications as prescribed o Report any questions or concerns to PharmD and/or provider(s)  Initial goal documentation        Diabetes   A1c  goal <7%  Recent Relevant Labs: Lab Results  Component Value Date/Time   HGBA1C 7.6 (H) 03/15/2020 09:32 AM   HGBA1C 6.2 (H) 02/17/2019 04:47 PM   HGBA1C 10.9 (H) 11/12/2018 10:18 AM   MICROALBUR 30 (H) 03/15/2020 09:32 AM    Last diabetic Eye exam:  Lab Results  Component Value Date/Time   HMDIABEYEEXA No Retinopathy 11/19/2017 12:00 AM    Last diabetic Foot exam: No results  found for: HMDIABFOOTEX   Checking BG: Never   Patient has failed these meds in past: Metformin- GI effects, glipizide? Patient is currently uncontrolled on the following medications: . Trulicity 1.5 mg weekly wednesdays  We discussed: Patient does not have a working glucometer and is not checking at home. Will collaborate with PCP to send in new RX for  meter and supplies. Patient is willing to start checking at home. He states he feels " much better" since MD increased Trulicity dose and states "it must be working". Reviewed A1C results increased from last time. Did not get to discuss diet/exercise as patient needed to cut visit short to pick up daughter.  Plan  Continue current medications  Hypertension   BP goal is:  <130/80  Office blood pressures are  BP Readings from Last 3 Encounters:  03/15/20 137/75  11/15/19 94/61  10/14/19 122/82    Patient has failed these meds in the past: NA Patient is currently query  controlled on the following medications:  NONE - declined ACE-I Will discuss at follow-up as patient was picking up his daughter and needed to cut the visit short.  Plan  Continue control with diet and exercise     GERD   Patient has failed these meds in past: Pantoprazole  Patient is currently query  controlled on the following medications:  . Dexilant 60 mg qd  We discussed:  Patient reports he has discussed continued symptoms with GI and understands that he will need to alter his diet to help alleviate symptoms. He is identifying triggers and working to adjust his intake or offensive foods and eating earlier. He states he has gastroparesis. He has received results from colonoscopy/EGD.  Plan  Continue current medications and manage with diet and lifestyle modifications.     Tobacco Abuse   Tobacco Status:  Social History   Tobacco Use  Smoking Status Current Every Day Smoker  . Packs/day: 0.50  . Years: 23.00  . Pack years: 11.50  . Types:  Cigarettes  Smokeless Tobacco Never Used  Tobacco Comment   since age 67    Unable to assess triggers, motivation and confidence in quitting this visit.  Previous quit attempts included: NA Patient is currently not taking in medications for this  We discussed:  Patient states he is interested in NRT or possibly Chantix to help him quit. He wants to do "some research" on options before discussing with PCP. He is not ready to contact [place] quit line.  Plan  Continue to assess patient willingness to quit and offering resources at each appointment.     Medication Management   [facility] Uses pill box? No - Only takes one medication Pt endorses  compliance    Plan  Continue current medication management strategy. Will discuss lipid panel90 at follow up visit.    Follow up: 3 month phone visit  Junita Push. Kenton Kingfisher PharmD, [facility] [phone]

## 2020-04-03 ENCOUNTER — Telehealth: Payer: Self-pay

## 2020-04-03 NOTE — Telephone Encounter (Signed)
Please write Rx and I'll sign ?

## 2020-04-03 NOTE — Telephone Encounter (Signed)
Copied from [place] [id number]. Topic: General - Inquiry >> Apr 03, 2020  3:32 PM Gillis Ends D wrote: Reason for CRM: [facility] called and stated that the prescription sent in for the verio one touch machine, test strips and lancets aren't covered on his insurance. His insurance only pays for Accu-Chek machine, test strips and lancets so they need a new prescription for Accu-Chek. Please advise

## 2020-04-05 NOTE — Telephone Encounter (Signed)
RX filled out and placed in providers folder for signature.

## 2020-04-07 NOTE — Telephone Encounter (Signed)
Rx faxed to [facility].  

## 2020-04-18 ENCOUNTER — Ambulatory Visit: Payer: Self-pay | Admitting: General Practice

## 2020-04-18 ENCOUNTER — Telehealth: Payer: Medicare Other | Admitting: General Practice

## 2020-04-18 DIAGNOSIS — Z720 Tobacco use: Secondary | ICD-10-CM

## 2020-04-18 DIAGNOSIS — F17200 Nicotine dependence, unspecified, uncomplicated: Secondary | ICD-10-CM

## 2020-04-18 DIAGNOSIS — E1165 Type 2 diabetes mellitus with hyperglycemia: Secondary | ICD-10-CM

## 2020-04-18 DIAGNOSIS — I10 Essential (primary) hypertension: Secondary | ICD-10-CM

## 2020-04-18 DIAGNOSIS — K219 Gastro-esophageal reflux disease without esophagitis: Secondary | ICD-10-CM

## 2020-04-18 NOTE — Patient Instructions (Signed)
Visit Information  Goals Addressed              This Visit's Progress   .  [name]: "My blood pressure is good" (pt-stated)        CARE PLAN ENTRY (see longtitudinal plan of care for additional care plan information)  Current Barriers:  . Chronic Disease Management support, education, and care coordination needs related to HTN and GERD with new onset of nausea and vomiting  Clinical Goal(s) related to HTN and GERD with new onset of nausea and vomiting  :  Over the next 120 days, patient will:  . Work with the care management team to address educational, disease management, and care coordination needs  . Begin or continue self health monitoring activities as directed today Measure and record blood pressure 1 times per week and adhere to a heart healthy/ADA diet . Call provider office for new or worsened signs and symptoms Blood pressure findings outside established parameters and New or worsened symptom related to GERD/Nausea/vomiting . Call care management team with questions or concerns . Verbalize basic understanding of patient centered plan of care established today  Interventions related to HTN and GERD with new onset of nausea and vomiting  :  . Evaluation of current treatment plans and patient's adherence to plan as established by provider.  The patient had a colonoscopy in June but has not had follow up for results. Educated on calling the specialist for follow up for results and recommendations. 02-23-2020: The patient verbalized he is frustrated as he has not heard back from his colonoscopy and does not know the findings. The patient verbalized he had called the office but did not receive a return call. Explained the GI provider office was different than [facility] but would send a message to see if they could reach out to him to provide him with his requested results. The patient verbalized understanding. The [name] will send an in basket message for Dr. Lucilla Lame.  The [name] did call Dr.  Maudie Mercury office and left a detailed message for the nurse for Dr. Allen Norris asking the nurse to follow up with the patient concerning his colonoscopy results. 04-18-2020: The patient states he is doing well and his GERD and HTN are stable . Assessed patient understanding of disease states. 04-18-2020: the patient has a good understanding of chronic conditions and is happy to note they are stable at this time.  . Assessed patient's education and care coordination needs.  04-18-2020: The patient expressed he just wants to do what he needs to do to stay healthy for his 39 year old child and himself. Emotional support provided.  . Provided disease specific education to patient.  04-18-2020: Education on a heart healthy/ADA diet. The patient denies any issues with dietary restrictions at this time. Reminded the patient to monitor for sodium in foods especially with the upcoming holidays and the family gatherings.  Nash Dimmer with appropriate clinical care team members regarding patient needs. LCSW for help with anxiety and smoking cessation, Pharmacist for help with medication reconciliation and possible what would help with smoking cessation.   Patient Self Care Activities related to HTN and GERD with new onset of Nausea and Vomiting :  . Patient is unable to independently self-manage chronic health conditions  Please see past updates related to this goal by clicking on the "Past Updates" button in the selected goal      .  [name]: Pt-"I only check my blood sugar once or twice every two  weeks" (pt-stated)        CARE PLAN ENTRY (see longtitudinal plan of care for additional care plan information)  Objective:  Lab Results  Component Value Date   HGBA1C 7.6 (H) 03/15/2020    Lab Results  Component Value Date   CREATININE 0.87 03/15/2020   BUN 8 03/15/2020   NA 138 03/15/2020   K 4.2 03/15/2020   CL 103 03/15/2020   CO2 23 03/15/2020    . No results found for: EGFR  Current Barriers:   Marland Kitchen Knowledge Deficits related to basic Diabetes pathophysiology and self care/management . Knowledge Deficits related to medications used for management of diabetes . Does not use cbg meter regularly- checks blood sugars once or twice a week- 04-18-2020- has a working meter at this time  Case Manager Clinical Goal(s):  Over the next 120 days, patient will demonstrate improved adherence to prescribed treatment plan for diabetes self care/management as evidenced by:  . daily monitoring and recording of CBG or as pcp recommends  . adherence to ADA/ carb modified diet . exercise 3 days/week . adherence to prescribed medication regimen  Interventions:  . Provided education to patient about basic DM disease process.  04-18-2020: Education on monitoring DM closer. The patient has a working meter but states that he only takes his blood sugars one time a week. Education on taking blood sugars 2 to 3 times a week and prn as needed.  . Reviewed medications with patient and discussed importance of medication adherence.  The patient endorses compliance.  04-18-2020: The patient endorses compliance with medication regimen.  . Discussed plans with patient for ongoing care management follow up and provided patient with direct contact information for care management team . Provided patient with written educational materials related to hypo and hyperglycemia and importance of correct treatment.  04-18-2020: Reviewed with the patient sx/sx of hypo and hyperglycemia and how to detect changes in blood sugar range.  . Advised patient, providing education and rationale, to check cbg as prescribed  and record, calling pcp for findings outside established parameters.  The patient states that his blood sugars are doing well and denies any concerns related to glucose readings or dietary restrictions.04-18-2020 the patient states last time he took his blood sugar was last week and it was 130.  Denies any episodes of low blood  sugars. Review of hemoglobin A1C elevation of 7.6 and the goal of hemoglobin A1C of 7.0 or less. Will continue to work with patient in managing DM    Patient Self Care Activities:  . UNABLE to independently manage diabetes effectively  Please see past updates related to this goal by clicking on the "Past Updates" button in the selected goal      .  [name]: Pt: "I am going to try to quit smoking in 2 weeks" (pt-stated)        [place] (see longtitudinal plan of care for additional care plan information)  Current Barriers:  . Tobacco abuse of  > 20 years; currently smoking 1/2 ppd . Previous quit attempts, per the patient he has never tried to quit before. Plans on quitting "cold [place]" in a couple of weeks . Denies smoking within 30 minutes of waking up . Reports triggers to smoke include: health, stressors in life, anxiety . Reports motivation to quit smoking includes: knowing it will help him feel better . On a scale of 1-10, reports MOTIVATION to quit is 8 . On a scale of 1-10, reports CONFIDENCE in  quitting is 7  Clinical Goal(s):  Marland Kitchen Over the next 120 days, patient will work with Comptroller Licensed Clinical Social Worker and provider towards tobacco cessation   Interventions: . Evaluation of current treatment plan reviewed.  The patient verbalized he is trying to cut back and quit. Has not been successful. Will work with the CCM team to meet health and wellness goals. 04-18-2020: The patient still has not been successful at quitting smoking. The patient wants to be healthy for his daughter. Will continue to work with the CCM team to help the patient toward smoking cessation.  . Provided contact information for [place] Quit Line (1-800-QUIT-NOW). Patient will outreach this group for support. . Discussed plans with patient for ongoing care management follow up and provided patient with direct contact information for care management team . Provided patient with written  smoking cessation educational materials through the [place] and EMMI educational system . Referred patient to pharmacy team . Provided contact information for [place] Quit Line (1-800-QUIT-NOW). Patient will outreach this group for support.   Patient Self Care Activities:  . Patient will commit to reducing tobacco consumption  Please see past updates related to this goal by clicking on the "Past Updates" button in the selected goal         The patient verbalized understanding of instructions, educational materials, and care plan provided today and declined offer to receive copy of patient instructions, educational materials, and care plan.   Telephone follow up appointment with care management team member scheduled for: 06-13-2020 at 11:30 am  [place], MSN, [facility] Mobile: [phone]

## 2020-04-18 NOTE — Chronic Care Management (AMB) (Signed)
Chronic Care Management   Follow Up Note   04/18/2020 Name: Steve [name] MRN: [id number] DOB: 1981/03/21  Referred by: Volney American, PA-C Reason for referral : Chronic Care Management ([name] Chronic Disease Mangement and Care coordination needs)   Steve [name] is a 39 y.o. year old male who is a primary care patient of Volney American, [place]. The CCM team was consulted for assistance with chronic disease management and care coordination needs.    Review of patient status, including review of consultants reports, relevant laboratory and other test results, and collaboration with appropriate care team members and the patient's provider was performed as part of comprehensive patient evaluation and provision of chronic care management services.    SDOH (Social Determinants of Health) assessments performed: Yes See Care Plan activities for detailed interventions related to Steve [name])     Outpatient Encounter Medications as of 04/18/2020  Medication Sig  . dexlansoprazole (DEXILANT) 60 MG capsule Take 1 capsule (60 mg total) by mouth daily.  . Dulaglutide (TRULICITY) 1.5 VO/3.5KK SOPN Inject 1.5 mg into the skin once a week.   No facility-administered encounter medications on file as of 04/18/2020.     Objective:  BP Readings from Last 3 Encounters:  03/15/20 137/75  11/15/19 94/61  10/14/19 122/82    Goals Addressed              This Visit's Progress   .  [name]: "My blood pressure is good" (pt-stated)        CARE PLAN ENTRY (see longtitudinal plan of care for additional care plan information)  Current Barriers:  . Chronic Disease Management support, education, and care coordination needs related to HTN and GERD with new onset of nausea and vomiting  Clinical Goal(s) related to HTN and GERD with new onset of nausea and vomiting  :  Over the next 120 days, patient will:  . Work with the care management team to address educational, disease management, and  care coordination needs  . Begin or continue self health monitoring activities as directed today Measure and record blood pressure 1 times per week and adhere to a heart healthy/ADA diet . Call provider office for new or worsened signs and symptoms Blood pressure findings outside established parameters and New or worsened symptom related to GERD/Nausea/vomiting . Call care management team with questions or concerns . Verbalize basic understanding of patient centered plan of care established today  Interventions related to HTN and GERD with new onset of nausea and vomiting  :  . Evaluation of current treatment plans and patient's adherence to plan as established by provider.  The patient had a colonoscopy in June but has not had follow up for results. Educated on calling the specialist for follow up for results and recommendations. 02-23-2020: The patient verbalized he is frustrated as he has not heard back from his colonoscopy and does not know the findings. The patient verbalized he had called the office but did not receive a return call. Explained the GI provider office was different than [facility] but would send a message to see if they could reach out to him to provide him with his requested results. The patient verbalized understanding. The [name] will send an in basket message for Dr. Lucilla Lame.  The [name] did call Dr. Maudie Mercury office and left a detailed message for the nurse for Dr. Allen Norris asking the nurse to follow up with the patient concerning his colonoscopy results. 04-18-2020: The patient states he is doing well  and his GERD and HTN are stable . Assessed patient understanding of disease states. 04-18-2020: the patient has a good understanding of chronic conditions and is happy to note they are stable at this time.  . Assessed patient's education and care coordination needs.  04-18-2020: The patient expressed he just wants to do what he needs to do to stay healthy for his 39 year old child and  himself. Emotional support provided.  . Provided disease specific education to patient.  04-18-2020: Education on a heart healthy/ADA diet. The patient denies any issues with dietary restrictions at this time. Reminded the patient to monitor for sodium in foods especially with the upcoming holidays and the family gatherings.  Steve [name] with appropriate clinical care team members regarding patient needs. LCSW for help with anxiety and smoking cessation, Pharmacist for help with medication reconciliation and possible what would help with smoking cessation.   Patient Self Care Activities related to HTN and GERD with new onset of Nausea and Vomiting :  . Patient is unable to independently self-manage chronic health conditions  Please see past updates related to this goal by clicking on the "Past Updates" button in the selected goal      .  [name]: Pt-"I only check my blood sugar once or twice every two weeks" (pt-stated)        CARE PLAN ENTRY (see longtitudinal plan of care for additional care plan information)  Objective:  Lab Results  Component Value Date   HGBA1C 7.6 (H) 03/15/2020    Lab Results  Component Value Date   CREATININE 0.87 03/15/2020   BUN 8 03/15/2020   NA 138 03/15/2020   K 4.2 03/15/2020   CL 103 03/15/2020   CO2 23 03/15/2020    . No results found for: EGFR  Current Barriers:  Marland Kitchen Knowledge Deficits related to basic Diabetes pathophysiology and self care/management . Knowledge Deficits related to medications used for management of diabetes . Does not use cbg meter regularly- checks blood sugars once or twice a week- 04-18-2020- has a working meter at this time  Case Manager Clinical Goal(s):  Over the next 120 days, patient will demonstrate improved adherence to prescribed treatment plan for diabetes self care/management as evidenced by:  . daily monitoring and recording of CBG or as pcp recommends  . adherence to ADA/ carb modified diet . exercise 3  days/week . adherence to prescribed medication regimen  Interventions:  . Provided education to patient about basic DM disease process.  04-18-2020: Education on monitoring DM closer. The patient has a working meter but states that he only takes his blood sugars one time a week. Education on taking blood sugars 2 to 3 times a week and prn as needed.  . Reviewed medications with patient and discussed importance of medication adherence.  The patient endorses compliance.  04-18-2020: The patient endorses compliance with medication regimen.  . Discussed plans with patient for ongoing care management follow up and provided patient with direct contact information for care management team . Provided patient with written educational materials related to hypo and hyperglycemia and importance of correct treatment.  04-18-2020: Reviewed with the patient sx/sx of hypo and hyperglycemia and how to detect changes in blood sugar range.  . Advised patient, providing education and rationale, to check cbg as prescribed  and record, calling pcp for findings outside established parameters.  The patient states that his blood sugars are doing well and denies any concerns related to glucose readings or dietary restrictions.04-18-2020 the  patient states last time he took his blood sugar was last week and it was 130.  Denies any episodes of low blood sugars. Review of hemoglobin A1C elevation of 7.6 and the goal of hemoglobin A1C of 7.0 or less. Will continue to work with patient in managing DM    Patient Self Care Activities:  . UNABLE to independently manage diabetes effectively  Please see past updates related to this goal by clicking on the "Past Updates" button in the selected goal      .  [name]: Pt: "I am going to try to quit smoking in 2 weeks" (pt-stated)        [place] (see longtitudinal plan of care for additional care plan information)  Current Barriers:  . Tobacco abuse of  > 20 years; currently  smoking 1/2 ppd . Previous quit attempts, per the patient he has never tried to quit before. Plans on quitting "cold [place]" in a couple of weeks . Denies smoking within 30 minutes of waking up . Reports triggers to smoke include: health, stressors in life, anxiety . Reports motivation to quit smoking includes: knowing it will help him feel better . On a scale of 1-10, reports MOTIVATION to quit is 8 . On a scale of 1-10, reports CONFIDENCE in quitting is 7  Clinical Goal(s):  Marland Kitchen Over the next 120 days, patient will work with Comptroller Licensed Clinical Social Worker and provider towards tobacco cessation   Interventions: . Evaluation of current treatment plan reviewed.  The patient verbalized he is trying to cut back and quit. Has not been successful. Will work with the CCM team to meet health and wellness goals. 04-18-2020: The patient still has not been successful at quitting smoking. The patient wants to be healthy for his daughter. Will continue to work with the CCM team to help the patient toward smoking cessation.  . Provided contact information for [place] Quit Line (1-800-QUIT-NOW). Patient will outreach this group for support. . Discussed plans with patient for ongoing care management follow up and provided patient with direct contact information for care management team . Provided patient with written smoking cessation educational materials through the [place] and EMMI educational system . Referred patient to pharmacy team . Provided contact information for [place] Quit Line (1-800-QUIT-NOW). Patient will outreach this group for support.   Patient Self Care Activities:  . Patient will commit to reducing tobacco consumption  Please see past updates related to this goal by clicking on the "Past Updates" button in the selected goal          Plan:   Telephone follow up appointment with care management team member scheduled for: 06-13-2020 at 11:30 am   [place], MSN,  [facility] Mobile: [phone]

## 2020-05-31 ENCOUNTER — Telehealth: Payer: Self-pay

## 2020-06-13 ENCOUNTER — Telehealth: Payer: Self-pay | Admitting: General Practice

## 2020-06-13 ENCOUNTER — Telehealth: Payer: Self-pay

## 2020-06-13 NOTE — Telephone Encounter (Signed)
  Chronic Care Management   Outreach Note  06/13/2020 Name: Steve [name] MRN: [id number] DOB: May 20, 1981  Referred by: Volney American, PA-C Reason for referral : Appointment ([name]: Follow up for Chronic Disease Management and Care Coordination Needs )   An unsuccessful telephone outreach was attempted today. The patient was referred to the case management team for assistance with care management and care coordination.   Follow Up Plan: Telephone follow up appointment with care management team member scheduled for: 07-18-2020 at 2 pm  [place], MSN, [facility] Mobile: [phone]

## 2020-06-23 ENCOUNTER — Ambulatory Visit (INDEPENDENT_AMBULATORY_CARE_PROVIDER_SITE_OTHER): Payer: Medicare Other | Admitting: Licensed Clinical Social Worker

## 2020-06-23 DIAGNOSIS — E1165 Type 2 diabetes mellitus with hyperglycemia: Secondary | ICD-10-CM

## 2020-06-23 DIAGNOSIS — I10 Essential (primary) hypertension: Secondary | ICD-10-CM

## 2020-06-23 DIAGNOSIS — Z720 Tobacco use: Secondary | ICD-10-CM

## 2020-06-23 DIAGNOSIS — F419 Anxiety disorder, unspecified: Secondary | ICD-10-CM

## 2020-06-23 NOTE — Chronic Care Management (AMB) (Signed)
Chronic Care Management    Clinical Social Work Note  06/23/2020 Name: Steve [name] MRN: [id number] DOB: 1980/09/04  Steve [name] is a 40 y.o. year old male who is a primary care patient of Volney American, [place]. The CCM team was consulted to assist the patient with chronic disease management and/or care coordination needs related to: [employer]  and [place] [employer].   Engaged with patient by telephone for follow up visit in response to provider referral for social work chronic care management and care coordination services.   Consent to Services:  The patient was given the following information about Chronic Care Management services today, agreed to services, and gave verbal consent: 1. CCM service includes personalized support from designated clinical staff supervised by the primary care provider, including individualized plan of care and coordination with other care providers 2. 24/7 contact phone numbers for assistance for urgent and routine care needs. 3. Service will only be billed when office clinical staff spend 20 minutes or more in a month to coordinate care. 4. Only one practitioner may furnish and bill the service in a calendar month. 5.The patient may stop CCM services at any time (effective at the end of the month) by phone call to the office staff. 6. The patient will be responsible for cost sharing (co-pay) of up to 20% of the service fee (after annual deductible is met). Patient agreed to services and consent obtained.  Patient agreed to services and consent obtained.   Assessment: Review of patient past medical history, allergies, medications, and health status, including review of relevant consultants reports was performed today as part of a comprehensive evaluation and provision of chronic care management and care coordination services.     SDOH (Social Determinants of Health) assessments and interventions performed:    Advanced  Directives Status: See Care Plan for related entries.  CCM Care Plan  No Known Allergies  Outpatient Encounter Medications as of 06/23/2020  Medication Sig  . dexlansoprazole (DEXILANT) 60 MG capsule Take 1 capsule (60 mg total) by mouth daily.  . Dulaglutide (TRULICITY) 1.5 GY/5.6LS SOPN Inject 1.5 mg into the skin once a week.   No facility-administered encounter medications on file as of 06/23/2020.    Patient Active Problem List   Diagnosis Date Noted  . Diarrhea   . Loss of weight   . Polyp of duodenum   . Polyp of descending colon   . Gastroesophageal reflux disease 11/17/2018  . Hypertension 02/10/2017  . Type 2 diabetes mellitus with hyperglycemia, without long-term current use of insulin ([place]) 11/16/2016  . Tobacco use 11/14/2016    Conditions to be addressed/monitored: Depression; Mental Health Concerns  and Social Isolation  Care Plan : Depression (Adult)  Updates made by Greg Cutter, LCSW since 06/23/2020 12:00 AM    Problem: Depression Identification (Depression)     Goal: Depressive Symptoms Identified   Start Date: 06/23/2020  Note:   Evidence-based guidance:   Identify risk for depression by reviewing presenting symptoms and risk factors.   Review use of medications that contribute to depression such as steroid, narcotic, sedative, antihypertensive, beta blocker, cytoxic agent.   Review related metabolic processes, including infection, anemia, thyroid dysfunction, kidney failure, heart failure, alcohol or substance use.   Perform depression screening using standardized tools to obtain baseline intensity of depressive symptoms.   Perform or refer for a full diagnostic interview when positive screening results are noted; use DSM-5 criteria to determine appropriate diagnosis (e.g.,  major depression, persistent depressive disorder, unspecified depressive    disorder).   Notes:   Follow Up Date- next quarter   - avoid negative self-talk - develop a  personal safety plan - develop a plan to deal with triggers like holidays, anniversaries - exercise at least 2 to 3 times per week - have a plan for how to handle bad days - journal feelings and what helps to feel better or worse - spend time or talk with others at least 2 to 3 times per week - spend time or talk with others every day - watch for early signs of feeling worse - write in journal every day    Why is this important?   Keeping track of your progress will help your treatment team find the right mix of medicine and therapy for you.  Write in your journal every day.  Day-to-day changes in depression symptoms are normal. It may be more helpful to check your progress at the end of each week instead of every day.     Notes:   Current Barriers:  . Acute Mental Health needs related to Stress and Anxiety . Financial constraints related to managing health care expenses . Limited social support . ADL IADL limitations . Mental Health Concerns  . Social Isolation . Lacks knowledge of community resource: available mental health resources that accept Medicare/Medicaid . Suicidal Ideation/Homicidal Ideation: No  Clinical Social Work Goal(s):  Marland Kitchen Over the next 120 days, patient will work with SW to address concerns related to lack of support/resource connection. LCSW will assist patient in gaining additional support/resource connection and community resource education in order to maintain health and mental health appropriately  . Over the next 120 days, patient will demonstrate improved adherence to self care as evidenced by implementing healthy self-care into his daily routine such as: attending all medical appointments, deep breathing exercises, taking time for self-reflection, taking medications as prescribed, drinking water and daily exercise to improve mobility.  . Over the next 120 days, patient will demonstrate improved health management independence as evidenced by implementing  healthy self-care skills and positive support/resources into his daily routine to help cope with stressors and improve overall health and well-being   Interventions: . Patient interviewed and appropriate assessments performed: GAD 7 . Provided mental health counseling with regard to anxiety management  . Provided patient with information about coping skills to implement into his daily routine to combat anxiety and panic. Marland Kitchen Discussed plans with patient for ongoing care management follow up and provided patient with direct contact information for care management team . Advised patient to check his email for list of available mental health support resources that were sent out on 01/05/20 for him to revert back to. . Assisted patient/caregiver with obtaining information about health plan benefits . Referred patient to [facility] (mental health provider) for long term follow up and therapy/counseling . Patient was alert, oriented x 4. Patient indicates stress around health. Patient reports experiencing vomiting and nausea 3-4 times per week. Patient and LCSW discussed a plan for smoking cessation. Patient does not wish to set a date to quit yet. LCSW discussed coping skills for future triggers. SW used active and reflective listening, validated patient's feelings/concerns, and provided emotional support throughout entire session.  . Brief CBT and Solution Focused interventions provided during session to help cope with stressors and plan to discontinue cigarette usage with adding support within his network.  . Patient reports that he is managing his mental  health symptoms well but will consider mental health resource implementation if this changes.   Depression screen [facility] 2/9 11/03/2019 09/09/2019 02/17/2019 11/14/2016  Decreased Interest 0 0 1 1  Down, Depressed, Hopeless 0 0 0 0  PHQ - 2 Score 0 0 1 1  Altered sleeping - - 0 -  Tired, decreased energy - - 2 -  Change in appetite - - 1 -   Feeling bad or failure about yourself  - - 0 -  Trouble concentrating - - 0 -  Moving slowly or fidgety/restless - - 0 -  Suicidal thoughts - - 0 -  PHQ-9 Score - - 4 -    Patient Self Care Activities:  . Self administers medications as prescribed . Attends all scheduled provider appointments . Performs ADL's independently . Performs IADL's independently . Calls provider office for new concerns or questions . Ability for insight . Independent living . Motivation for treatment  Patient Coping Strengths:  . Hopefulness . Self Advocate . Able to Communicate Effectively  Patient Self Care Deficits:  . Lacks social connections    Task: Identify Depressive Symptoms and Facilitate Treatment   Note:   Care Management Activities:    - participation in psychiatric services encouraged    Notes:       Follow Up Plan: SW will follow up with patient by phone over the next quarter      Eula Fried, BSW, MSW, [email or web address] Phone: [phone]

## 2020-06-30 ENCOUNTER — Telehealth: Payer: Self-pay

## 2020-06-30 ENCOUNTER — Telehealth: Payer: Self-pay | Admitting: Pharmacist

## 2020-06-30 NOTE — Telephone Encounter (Signed)
  Chronic Care Management   Note  06/30/2020 Name: Steve [name] MRN: [id number] DOB: Sep 07, 1980    Follow up plan:  Patient requested reschedule another day after 4 pm due to work schedule. Rescheduled for 07/17/20 at 4 PM.   Junita Push. Kenton Kingfisher PharmD, [facility] [phone]

## 2020-07-17 ENCOUNTER — Ambulatory Visit (INDEPENDENT_AMBULATORY_CARE_PROVIDER_SITE_OTHER): Payer: Medicare Other | Admitting: Pharmacist

## 2020-07-17 DIAGNOSIS — E1165 Type 2 diabetes mellitus with hyperglycemia: Secondary | ICD-10-CM

## 2020-07-17 DIAGNOSIS — Z720 Tobacco use: Secondary | ICD-10-CM

## 2020-07-17 NOTE — Progress Notes (Signed)
Chronic Care Management Pharmacy Note  07/31/2020 Name:  Steve [name] MRN:  [id number] DOB:  1980-08-29  Subjective: Steve [name] is an 40 y.o. year old male who is a primary patient of Jon Billings, NP.  The CCM team was consulted for assistance with disease management and care coordination needs.    Engaged with patient by telephone for follow up visit in response to provider referral for pharmacy case management and/or care coordination services.   Consent to Services:  The patient was given information about Chronic Care Management services, agreed to services, and gave verbal consent prior to initiation of services.  Please see initial visit note for detailed documentation.   Patient Care Team: Jon Billings, NP as PCP - [facility] Busing Nobie Putnam, RN as Case Manager ([place]) Greg Cutter, LCSW as Social Worker (Licensed Clinical Social Worker) Vladimir Faster, [facility] (Pharmacist)  Recent office visits: 03/15/20- Dr Wynetta Emery - Trulicity increased 1.5mg  qweek  Recent consult visits: [facility] visits: None in previous 6 months  Objective:  Lab Results  Component Value Date   CREATININE 0.87 03/15/2020   BUN 8 03/15/2020   GFRNONAA 109 03/15/2020   GFRAA 126 03/15/2020   NA 138 03/15/2020   K 4.2 03/15/2020   CALCIUM 9.5 03/15/2020   CO2 23 03/15/2020    Lab Results  Component Value Date/Time   HGBA1C 7.6 (H) 03/15/2020 09:32 AM   HGBA1C 6.2 (H) 02/17/2019 04:47 PM   HGBA1C 10.9 (H) 11/12/2018 10:18 AM   MICROALBUR 30 (H) 03/15/2020 09:32 AM    Last diabetic Eye exam:  Lab Results  Component Value Date/Time   HMDIABEYEEXA No Retinopathy 11/19/2017 12:00 AM    Last diabetic Foot exam: No results found for: HMDIABFOOTEX   Lab Results  Component Value Date   CHOL 250 (H) 03/15/2020   HDL 26 (L) 03/15/2020   LDLCALC 150 (H) 03/15/2020   TRIG 393 (H) 03/15/2020    Hepatic Function Latest Ref Rng & Units 03/15/2020 11/12/2018  08/06/2018  Total Protein 6.0 - 8.5 g/dL 6.6 6.7 6.8  Albumin 4.0 - 5.0 g/dL 4.2 4.1 4.5  AST 0 - 40 IU/L 15 11 10   ALT 0 - 44 IU/L 32 18 20  Alk Phosphatase 44 - 121 IU/L 78 87 96  Total Bilirubin 0.0 - 1.2 mg/dL 0.5 0.4 0.6  Bilirubin, Direct 0.1 - 0.5 mg/dL - - -    Lab Results  Component Value Date/Time   TSH 1.950 03/15/2020 09:34 AM    CBC Latest Ref Rng & Units 03/15/2020 09/05/2016 08/15/2015  WBC 3.4 - 10.8 x10E3/uL 3.6 4.5 4.7  Hemoglobin 13.0 - 17.7 g/dL 14.4 15.1 15.4  Hematocrit 37.5 - 51.0 % 42.6 45.1 46.2  Platelets 150 - 450 x10E3/uL 131(L) 158 162    No results found for: VD25OH  Clinical ASCVD: No  The ASCVD Risk score Mikey Bussing DC Jr., et al., 2013) failed to calculate for the following reasons:   The 2013 ASCVD risk score is only valid for ages 63 to 8    Depression screen PHQ 2/9 11/03/2019 09/09/2019 02/17/2019  Decreased Interest 0 0 1  Down, Depressed, Hopeless 0 0 0  PHQ - 2 Score 0 0 1  Altered sleeping - - 0  Tired, decreased energy - - 2  Change in appetite - - 1  Feeling bad or failure about yourself  - - 0  Trouble concentrating - - 0  Moving slowly or fidgety/restless - -  0  Suicidal thoughts - - 0  PHQ-9 Score - - 4       Social History   Tobacco Use  Smoking Status Current Every Day Smoker  . Packs/day: 0.50  . Years: 23.00  . Pack years: 11.50  . Types: Cigarettes  Smokeless Tobacco Never Used  Tobacco Comment   since age 26   BP Readings from Last 3 Encounters:  03/15/20 137/75  11/15/19 94/61  10/14/19 122/82   Pulse Readings from Last 3 Encounters:  03/15/20 83  11/15/19 (!) 59  10/14/19 72   Wt Readings from Last 3 Encounters:  03/15/20 252 lb 12.8 oz (114.7 kg)  11/15/19 242 lb (109.8 kg)  10/14/19 246 lb 12.8 oz (111.9 kg)    Assessment/Interventions: Review of patient past medical history, allergies, medications, health status, including review of consultants reports, laboratory and other test data, was performed as  part of comprehensive evaluation and provision of chronic care management services.   SDOH:  (Social Determinants of Health) assessments and interventions performed: No  CARE PLAN ENTRY (see longitudinal plan of care for additional care plan information)  Current Barriers:  . Chronic Disease Management support, education, and care coordination needs related to Hypertension, Diabetes, GERD, and Tobacco use    Diabetes Lab Results  Component Value Date/Time   HGBA1C 7.6 (H) 03/15/2020 09:32 AM   HGBA1C 6.2 (H) 02/17/2019 04:47 PM   HGBA1C 10.9 (H) 11/12/2018 10:18 AM   . Pharmacist Clinical Goal(s): o Over the next 90 days, patient will work with PharmD and providers to achieve A1c goal <7% . Current regimen:  o Trulicity 1.5 mg weekly  o Not currently checking BG. Will p/u glucometer and Trulicity tomorrow . Interventions: o Reviewed fill history o Encouraged patient to pick up meter and begin checking at home. o Reviewed goal glucose readings for an A1c of <7%, we want to see fasting sugars <130 and 2 hour after meal sugars <180.  o Counseled on complications of diabetes including kidney & retinal damage and cardiovascular disease. . Patient self care activities - Over the next 90 days, patient will: o Check blood sugar twice daily, document, and provide at future appointments o Contact provider with any episodes of hypoglycemia   GERD . Pharmacist Clinical Goal(s) o Over the next 90 days, patient will work with PharmD and providers to minimize symptoms . Current regimen:  o Dexilant 60 mg daily . Interventions: o Provided diet and exercise counseling.  o Verified patient received egd/colonscopy results . Patient self care activities - Over the next 90 days, patient will: o Continue identifying and minimizing triggers  Tobacco use (Goal: achieve smoking cessation) -Uncontrolled -Previous quit attempts: several, NRT,  -Current treatment  . NONE -Patient smokes Within  30 minutes of waking -Patient triggers include: driving and taking a work break and seeing someone else smoke -On a scale of 1-10, reports MOTIVATION to quit is 6-7 -On a scale of 1-10, reports CONFIDENCE in quitting is 2-3 -Provided contact information for [employer] (1-800-QUIT-NOW) and encouraged patient to reach out to this group for support.  -Educated on medication strategies including Chantix & Wellbutrin. Patient is interested in discussing with prescriber. Encouraged patient to make appointment with new PCP.  Medication management . Pharmacist Clinical Goal(s): o Over the next 90 days, patient will work with PharmD and providers to achieve optimal medication adherence . Current pharmacy: [facility] . Interventions o Comprehensive medication review performed. o Continue current medication management strategy .  Patient self care activities - Over the next 90 days, patient will: o Focus on medication adherence by fill dates o Take medications as prescribed o Report any questions or concerns to PharmD and/or provider(s)  Please see past updates related to this goal by clicking on the "Past Updates" button in the selected goal     Medication Assistance: None required.  Patient affirms current coverage meets needs.  Patient's preferred pharmacy is:  [id number] [place], [place] [address] [place] [address] Phone: [phone] Fax: [phone]  Uses pill box? No - doesn't have one Pt endorses 90 % compliance  We discussed: Benefits of medication synchronization, packaging and delivery as well as enhanced pharmacist oversight with [employer]. Patient decided to: Continue current medication management strategy  Care Plan and Follow Up Patient Decision:  Patient agrees to Care Plan and Follow-up.  Plan: Telephone follow up appointment with care management team member scheduled for:  3 months  Junita Push. Kenton Kingfisher PharmD, [facility] [phone]  .

## 2020-07-21 ENCOUNTER — Telehealth: Payer: Medicare Other

## 2020-07-21 ENCOUNTER — Telehealth: Payer: Self-pay | Admitting: General Practice

## 2020-07-21 NOTE — Telephone Encounter (Signed)
  Chronic Care Management   Outreach Note  07/21/2020 Name: Steve [name] MRN: [id number] DOB: [birth year]  Referred by: Jon Billings, NP Reason for referral : Appointment ([name]: Follow up for Chronic Disease Management and Care Coordination Needs)   A second unsuccessful telephone outreach was attempted today. The patient was referred to the case management team for assistance with care management and care coordination. The patient answered but was at the day care with his daughter and could not talk right now. Ask for reschedule.     Follow Up Plan: The care management team will reach out to the patient again over the next 30 to 60 days.   Noreene Larsson RN, MSN, [facility] Mobile: [phone]

## 2020-07-27 ENCOUNTER — Telehealth: Payer: Self-pay

## 2020-07-31 NOTE — Patient Instructions (Addendum)
Visit Information  It was a pleasure speaking with you today. Thank you for letting me be part of your clinical team. Please call with any questions or concerns.   Goals Addressed            This Visit's Progress   . Monitor and Manage My Blood Sugar-Diabetes Type 2       Timeframe:  Long-Range Goal Priority:  High Start Date:                             Expected End Date:                       Follow Up Date  3 month follow up    - check blood sugar at prescribed times - enter blood sugar readings and medication or insulin into daily log - take the blood sugar meter to all doctor visits    Why is this important?    Checking your blood sugar at home helps to keep it from getting very high or very low.   Writing the results in a diary or log helps the doctor know how to care for you.   Your blood sugar log should have the time, date and the results.   Also, write down the amount of insulin or other medicine that you take.   Other information, like what you ate, exercise done and how you were feeling, will also be helpful.     Notes:     . Obtain Eye Exam-Diabetes Type 2       Follow Up Date 3 month follow up   - schedule appointment with eye doctor    Why is this important?    Eye check-ups are important when you have diabetes.   Vision loss can be prevented.    Notes:     . Pharmacy Care Plan       CARE PLAN ENTRY (see longitudinal plan of care for additional care plan information)  Current Barriers:  . Chronic Disease Management support, education, and care coordination needs related to Hypertension, Diabetes, GERD, and Tobacco use    Diabetes Lab Results  Component Value Date/Time   HGBA1C 7.6 (H) 03/15/2020 09:32 AM   HGBA1C 6.2 (H) 02/17/2019 04:47 PM   HGBA1C 10.9 (H) 11/12/2018 10:18 AM   . Pharmacist Clinical Goal(s): o Over the next 90 days, patient will work with PharmD and providers to achieve A1c goal <7% . Current regimen:  o Trulicity  1.5 mg weekly  o Not currently checking BG. Will p/u glucometer and Trulicity tomorrow . Interventions: o Reviewed fill history o Encouraged patient to pick up meter and begin checking at home. o Reviewed goal glucose readings for an A1c of <7%, we want to see fasting sugars <130 and 2 hour after meal sugars <180.  o Counseled on complications of diabetes including kidney & retinal damage and cardiovascular disease. . Patient self care activities - Over the next 90 days, patient will: o Check blood sugar twice daily, document, and provide at future appointments o Contact provider with any episodes of hypoglycemia   GERD . Pharmacist Clinical Goal(s) o Over the next 90 days, patient will work with PharmD and providers to minimize symptoms . Current regimen:  o Dexilant 60 mg daily . Interventions: o Provided diet and exercise counseling.  o Verified patient received egd/colonscopy results . Patient self care activities - Over the next  90 days, patient will: o Continue identifying and minimizing triggers  Tobacco use (Goal: achieve smoking cessation) -Uncontrolled -Previous quit attempts: several, NRT,  -Current treatment  . NONE -Patient smokes Within 30 minutes of waking -Patient triggers include: driving and taking a work break and seeing someone else smoke -On a scale of 1-10, reports MOTIVATION to quit is 6-7 -On a scale of 1-10, reports CONFIDENCE in quitting is 2-3 -Provided contact information for [employer] (1-800-QUIT-NOW) and encouraged patient to reach out to this group for support.  -Educated on medication strategies including Chantix & Wellbutrin. Patient is interested in discussing with prescriber. Encouraged patient to make appointment with new PCP.  Medication management . Pharmacist Clinical Goal(s): o Over the next 90 days, patient will work with PharmD and providers to achieve optimal medication adherence . Current pharmacy: [facility] . Interventions o Comprehensive medication review performed. o Continue current medication management strategy . Patient self care activities - Over the next 90 days, patient will: o Focus on medication adherence by fill dates o Take medications as prescribed o Report any questions or concerns to PharmD and/or provider(s)  Please see past updates related to this goal by clicking on the "Past Updates" button in the selected goal         The patient verbalized understanding of instructions, educational materials, and care plan provided today and declined offer to receive copy of patient instructions, educational materials, and care plan.   Telephone follow up appointment with pharmacy team member scheduled for: 3 months  Junita Push. Sherene Plancarte PharmD, BCPS Clinical Pharmacist [phone]  Diabetes Mellitus and Nutrition, Adult When you have diabetes, or diabetes mellitus, it is very important to have healthy eating habits because your blood sugar (glucose) levels are greatly affected by what you eat and drink. Eating healthy foods in the right amounts, at about the same times every day, can help you:  Control your blood glucose.  Lower your risk of heart disease.  Improve your blood pressure.  Reach or maintain a healthy weight. What can affect my meal plan? Every person with diabetes is different, and each person has different needs for a meal plan. Your health care provider may recommend that you work with a dietitian to make a meal plan that is best for you. Your meal plan may vary depending on factors such as:  The calories you need.  The medicines you take.  Your weight.  Your blood glucose, blood pressure, and cholesterol levels.  Your activity level.  Other health conditions you have, such as heart or kidney disease. How do carbohydrates affect me? Carbohydrates, also called carbs, affect your blood glucose level more than any other type of food. Eating carbs  naturally raises the amount of glucose in your blood. Carb counting is a method for keeping track of how many carbs you eat. Counting carbs is important to keep your blood glucose at a healthy level, especially if you use insulin or take certain oral diabetes medicines. It is important to know how many carbs you can safely have in each meal. This is different for every person. Your dietitian can help you calculate how many carbs you should have at each meal and for each snack. How does alcohol affect me? Alcohol can cause a sudden decrease in blood glucose (hypoglycemia), especially if you use insulin or take certain oral diabetes medicines. Hypoglycemia can be a life-threatening condition. Symptoms of hypoglycemia, such as sleepiness, dizziness, and confusion, are similar to symptoms  of having too much alcohol.  Do not drink alcohol if: ? Your health care provider tells you not to drink. ? You are pregnant, may be pregnant, or are planning to become pregnant.  If you drink alcohol: ? Do not drink on an empty stomach. ? Limit how much you use to:  0-1 drink a day for women.  0-2 drinks a day for men. ? Be aware of how much alcohol is in your drink. In the [place] one drink equals one 12 oz bottle of beer (355 mL), one 5 oz glass of wine (148 mL), or one 1 oz glass of hard liquor (44 mL). ? Keep yourself hydrated with water, diet soda, or unsweetened iced tea.  Keep in mind that regular soda, juice, and other mixers may contain a lot of sugar and must be counted as carbs. What are tips for following this plan? Reading food labels  Start by checking the serving size on the "Nutrition Facts" label of packaged foods and drinks. The amount of calories, carbs, fats, and other nutrients listed on the label is based on one serving of the item. Many items contain more than one serving per package.  Check the total grams (g) of carbs in one serving. You can calculate the number of servings of carbs in  one serving by dividing the total carbs by 15. For example, if a food has 30 g of total carbs per serving, it would be equal to 2 servings of carbs.  Check the number of grams (g) of saturated fats and trans fats in one serving. Choose foods that have a low amount or none of these fats.  Check the number of milligrams (mg) of salt (sodium) in one serving. Most people should limit total sodium intake to less than 2,300 mg per day.  Always check the nutrition information of foods labeled as "low-fat" or "nonfat." These foods may be higher in added sugar or refined carbs and should be avoided.  Talk to your dietitian to identify your daily goals for nutrients listed on the label. Shopping  Avoid buying canned, pre-made, or processed foods. These foods tend to be high in fat, sodium, and added sugar.  Shop around the outside edge of the grocery store. This is where you will most often find fresh fruits and vegetables, bulk grains, fresh meats, and fresh dairy. Cooking  Use low-heat cooking methods, such as baking, instead of high-heat cooking methods like deep frying.  Cook using healthy oils, such as olive, canola, or sunflower oil.  Avoid cooking with butter, cream, or high-fat meats. Meal planning  Eat meals and snacks regularly, preferably at the same times every day. Avoid going long periods of time without eating.  Eat foods that are high in fiber, such as fresh fruits, vegetables, beans, and whole grains. Talk with your dietitian about how many servings of carbs you can eat at each meal.  Eat 4-6 oz (112-168 g) of lean protein each day, such as lean meat, chicken, fish, eggs, or tofu. One ounce (oz) of lean protein is equal to: ? 1 oz (28 g) of meat, chicken, or fish. ? 1 egg. ?  cup (62 g) of tofu.  Eat some foods each day that contain healthy fats, such as avocado, nuts, seeds, and fish.   What foods should I eat? Fruits Berries. Apples. Oranges. Peaches. Apricots. Plums.  Grapes. Mango. Papaya. Pomegranate. Kiwi. Cherries. Vegetables Lettuce. Spinach. Leafy greens, including kale, chard, collard greens, and mustard greens. Beets.  Cauliflower. Cabbage. Broccoli. Carrots. Green beans. Tomatoes. Peppers. Onions. Cucumbers. Brussels sprouts. Grains Whole grains, such as whole-wheat or whole-grain bread, crackers, tortillas, cereal, and pasta. Unsweetened oatmeal. Quinoa. Brown or wild rice. Meats and other proteins Seafood. Poultry without skin. Lean cuts of poultry and beef. Tofu. Nuts. Seeds. Dairy Low-fat or fat-free dairy products such as milk, yogurt, and cheese. The items listed above may not be a complete list of foods and beverages you can eat. Contact a dietitian for more information. What foods should I avoid? Fruits Fruits canned with syrup. Vegetables Canned vegetables. Frozen vegetables with butter or cream sauce. Grains Refined white flour and flour products such as bread, pasta, snack foods, and cereals. Avoid all processed foods. Meats and other proteins Fatty cuts of meat. Poultry with skin. Breaded or fried meats. Processed meat. Avoid saturated fats. Dairy Full-fat yogurt, cheese, or milk. Beverages Sweetened drinks, such as soda or iced tea. The items listed above may not be a complete list of foods and beverages you should avoid. Contact a dietitian for more information. Questions to ask a health care provider  Do I need to meet with a diabetes educator?  Do I need to meet with a dietitian?  What number can I call if I have questions?  When are the best times to check my blood glucose? Where to find more information:  American Diabetes Association: diabetes.org  Academy of Nutrition and Dietetics: www.eatright.[employer] of Diabetes and Digestive and Kidney Diseases: [employer]  Association of Diabetes Care and Education Specialists: www.diabeteseducator.org Summary  It is important to have healthy  eating habits because your blood sugar (glucose) levels are greatly affected by what you eat and drink.  A healthy meal plan will help you control your blood glucose and maintain a healthy lifestyle.  Your health care provider may recommend that you work with a dietitian to make a meal plan that is best for you.  Keep in mind that carbohydrates (carbs) and alcohol have immediate effects on your blood glucose levels. It is important to count carbs and to use alcohol carefully. This information is not intended to replace advice given to you by your health care provider. Make sure you discuss any questions you have with your health care provider. Document Revised: 04/27/2019 Document Reviewed: 04/27/2019 Elsevier Patient Education  2021 [employer].

## 2020-08-23 ENCOUNTER — Telehealth: Payer: Medicare Other

## 2020-08-30 ENCOUNTER — Telehealth: Payer: Medicare Other

## 2020-08-30 ENCOUNTER — Telehealth: Payer: Self-pay | Admitting: General Practice

## 2020-08-30 NOTE — Telephone Encounter (Signed)
  Chronic Care Management   Outreach Note  08/30/2020 Name: Steve [name] MRN: [id number] DOB: [birth year]  Referred by: Jon Billings, NP Reason for referral : Appointment ([name]: Follow up for Chronic Disease Management and Care Coordination Needs- 3rd attempt)   Third unsuccessful telephone outreach was attempted today. The patient was referred to the case management team for assistance with care management and care coordination. The patient's primary care provider has been notified of our unsuccessful attempts to make or maintain contact with the patient. The care management team is pleased to engage with this patient at any time in the future should he/she be interested in assistance from the care management team. The patient was unable to talk to the [facility]. The patient states he is doing well.   Follow Up Plan: No further follow up required: 3rd attempt. Will follow up with the patient if the pcp does a new referral.   Noreene Larsson RN, MSN, [facility] Mobile: [phone]

## 2020-09-04 ENCOUNTER — Telehealth: Payer: Self-pay

## 2020-09-06 ENCOUNTER — Ambulatory Visit: Payer: Medicare Other | Admitting: Pharmacist

## 2020-09-06 ENCOUNTER — Telehealth: Payer: Self-pay | Admitting: Pharmacist

## 2020-09-06 NOTE — Patient Instructions (Addendum)
Visit Information  It was a pleasure speaking with you today. Thank you for letting me be part of your clinical team. Please call with any questions or concerns.   Goals Addressed            This Visit's Progress   . Monitor and Manage My Blood Sugar-Diabetes Type 2       Timeframe:  Long-Range Goal Priority:  High Start Date:                             Expected End Date:                       Follow Up Date  3 month follow up 09/06/20 Not checking BG, Not taking Trulicity as prescribed   - check blood sugar at prescribed times - enter blood sugar readings and medication or insulin into daily log - take the blood sugar meter to all doctor visits    Why is this important?    Checking your blood sugar at home helps to keep it from getting very high or very low.   Writing the results in a diary or log helps the doctor know how to care for you.   Your blood sugar log should have the time, date and the results.   Also, write down the amount of insulin or other medicine that you take.   Other information, like what you ate, exercise done and how you were feeling, will also be helpful.     Notes:        The patient verbalized understanding of instructions, educational materials, and care plan provided today and declined offer to receive copy of patient instructions, educational materials, and care plan.   Patient given contact information and can reach out if needed.  Junita Push. [place] PharmD, BCPS Clinical Pharmacist [phone]  Preventing Diabetes Mellitus Complications You can help to prevent or slow down problems that are caused by diabetes (diabetes mellitus). Following your diabetes plan and taking care of yourself can reduce your risk of serious or life-threatening complications. What actions can I take to prevent diabetes complications? Diabetes management  Follow instructions from your health care providers about managing your diabetes. Your diabetes may be  managed by a team of health care providers who can teach you how to care for yourself and can answer questions that you have.  Educate yourself about your condition so you can make healthy choices about eating and physical activity.  Know your target range for your blood sugar (glucose), and check your blood glucose level as often as told. Your health care provider will help you decide how often to check your blood glucose level depending on your treatment goals and how well you are meeting them.  Ask your health care provider if you should take low-dose aspirin daily and what dose is recommended for you. Taking low-dose aspirin daily is recommended to help prevent cardiovascular disease.   Controlling your blood pressure and cholesterol Your personal target blood pressure is determined based on:  Your age.  Your medicines.  How long you have had diabetes.  Any other medical conditions you have. To control your blood pressure:  Follow instructions from your health care provider about meal planning, exercise, and medicines.  Make sure your health care provider checks your blood pressure at every medical visit.  Monitor your blood pressure at home as told by your health care provider.  To control your cholesterol:  Follow instructions from your health care provider about meal planning, exercise, and medicines.  Have your cholesterol checked at least once a year.  You may be prescribed medicine to lower cholesterol (statin). If you are not taking a statin, ask your health care provider if you should be. Controlling your cholesterol may:  Help prevent heart disease and stroke. These are the most common health problems for people with diabetes.  Improve your blood flow.   Medical appointments and vaccines Schedule and keep yearly physical exams and eye exams. Your health care provider will tell you how often you need medical visits depending on your diabetes management plan. Keep all  follow-up visits as told. This is important so possible problems can be identified early and complications can be avoided or treated.  Every visit with your health care provider should include measuring your: ? Weight. ? Blood pressure. ? Blood glucose control.  Your A1C (hemoglobin A1C) level should be checked: ? At least 2 times a year, if you are meeting your treatment goals. ? 4 times a year, if you are not meeting treatment goals or if your treatment goals have changed.  Your blood lipids (lipid profile) should be checked yearly. You should also be checked yearly for protein in your urine (urine microalbumin).  If you have type 1 diabetes, get an eye exam 3-5 years after you are diagnosed, and then once a year after your first exam.  If you have type 2 diabetes, get an eye exam as soon as you are diagnosed, and then once a year after your first exam. It is also important to keep your vaccines current. It is recommended that you receive:  A flu (influenza) vaccine every year.  A pneumonia (pneumococcal) vaccine and a hepatitis B vaccine. If you are age 59 or older, you may get the pneumonia vaccine as a series of two separate shots. Ask your health care provider which other vaccines may be recommended. Lifestyle  Do not use any products that contain nicotine or tobacco, such as cigarettes, e-cigarettes, and chewing tobacco. If you need help quitting, ask your health care provider. By avoiding nicotine and tobacco: ? You will lower your risk for heart attack, stroke, nerve disease, and kidney disease. ? Your cholesterol and blood pressure may improve. ? Your blood circulation will improve.  If you drink alcohol: ? Limit how much you use to:  0-1 drink a day for women who are not pregnant.  0-2 drinks a day for men. ? Be aware of how much alcohol is in your drink. In the [place] one drink equals one 12 oz bottle of beer (355 mL), one 5 oz glass of wine (148 mL), or one 11?2 oz  glass of hard liquor (44 mL). Taking care of your feet Diabetes may cause you to have poor blood circulation to your legs and feet. Because of this, taking care of your feet is very important. Diabetes can cause:  The skin on the feet to get thinner, break more easily, and heal more slowly.  Nerve damage in your legs and feet, which results in decreased feeling. You may not notice minor injuries that could lead to serious problems. To avoid foot problems:  Check your skin and feet every day for cuts, bruises, redness, blisters, or sores.  Schedule a foot exam with your health care provider once every year. This exam includes: ? Inspecting the structure and skin of your feet. ? Checking the pulses and  sensation in your feet.  Make sure that your health care provider performs a visual foot exam at every medical visit.   Taking care of your teeth People with poorly controlled diabetes are more likely to have gum (periodontal) disease. Diabetes can make periodontal diseases harder to control. If not treated, periodontal diseases can lead to tooth loss. To prevent this:  Brush your teeth twice a day.  Floss at least once a day.  Visit your dentist 2 times a year. Managing stress Living with diabetes can be stressful. When you are experiencing stress, your blood glucose may be affected in two ways:  Stress hormones may cause your blood glucose to rise.  You may be distracted from taking good care of yourself. Be aware of your stress level and make changes to help you manage challenging situations. To lower your stress levels:  Consider joining a support group.  Do planned relaxation or meditation.  Do a hobby that you enjoy.  Maintain healthy relationships.  Exercise regularly.  Work with your health care provider or a mental health professional. Where to find more information  American Diabetes Association: www.diabetes.org  Association of Diabetes Care and Education  Specialists: www.diabeteseducator.org Summary  You can take action to prevent or slow down problems that are caused by diabetes (diabetes mellitus). Following your diabetes plan and taking care of yourself can reduce your risk of serious or life-threatening complications.  Follow instructions from your health care providers about managing your diabetes. Your diabetes may be managed by a team of health care providers who can teach you how to care for yourself and can answer questions that you have.  Know your target range for your blood sugar (glucose), and check your blood glucose levels as often as told. Your health care provider will help you decide how often you should check your blood glucose level depending on your treatment goals and how well you are meeting them.  Your health care provider will tell you how often you need medical visits depending on your diabetes management plan. Keep all follow-up visits as directed. This is important so possible problems can be identified early and complications can be avoided or treated. This information is not intended to replace advice given to you by your health care provider. Make sure you discuss any questions you have with your health care provider. Document Revised: 07/09/2019 Document Reviewed: 07/09/2019 Elsevier Patient Education  2021 [employer].

## 2020-09-06 NOTE — Telephone Encounter (Signed)
Pt scheduled for 09/12/2020

## 2020-09-06 NOTE — Progress Notes (Signed)
Chronic Care Management Pharmacy Note  09/06/2020 Name:  Steve [name] MRN:  [id number] DOB:  [birth year]  Subjective: Steve [name] is an 40 y.o. year old male who is a primary patient of Steve Billings, NP.  The CCM team was consulted for assistance with disease management and care coordination needs.    Engaged with patient by telephone for follow up visit in response to provider referral for pharmacy case management and/or care coordination services.   Consent to Services:  The patient was given information about Chronic Care Management services, agreed to services, and gave verbal consent prior to initiation of services.  Please see initial visit note for detailed documentation.   Patient Care Team: Steve Billings, NP as PCP - General Greg Cutter, LCSW as Social Worker (Licensed Clinical Social Worker) Vladimir Faster, [facility] (Pharmacist)  Recent office visits: 03/15/20- Dr Wynetta Emery - Trulicity increased 1.5mg  qweek  Recent consult visits: [facility] visits: None in previous 6 months  Objective:  Lab Results  Component Value Date   CREATININE 0.87 03/15/2020   BUN 8 03/15/2020   GFRNONAA 109 03/15/2020   GFRAA 126 03/15/2020   NA 138 03/15/2020   K 4.2 03/15/2020   CALCIUM 9.5 03/15/2020   CO2 23 03/15/2020    Lab Results  Component Value Date/Time   HGBA1C 7.6 (H) 03/15/2020 09:32 AM   HGBA1C 6.2 (H) 02/17/2019 04:47 PM   HGBA1C 10.9 (H) 11/12/2018 10:18 AM   MICROALBUR 30 (H) 03/15/2020 09:32 AM    Last diabetic Eye exam:  Lab Results  Component Value Date/Time   HMDIABEYEEXA No Retinopathy 11/19/2017 12:00 AM    Last diabetic Foot exam: No results found for: HMDIABFOOTEX   Lab Results  Component Value Date   CHOL 250 (H) 03/15/2020   HDL 26 (L) 03/15/2020   LDLCALC 150 (H) 03/15/2020   TRIG 393 (H) 03/15/2020    Hepatic Function Latest Ref Rng & Units 03/15/2020 11/12/2018 08/06/2018  Total Protein 6.0 - 8.5 g/dL 6.6 6.7 6.8   Albumin 4.0 - 5.0 g/dL 4.2 4.1 4.5  AST 0 - 40 IU/L 15 11 10   ALT 0 - 44 IU/L 32 18 20  Alk Phosphatase 44 - 121 IU/L 78 87 96  Total Bilirubin 0.0 - 1.2 mg/dL 0.5 0.4 0.6  Bilirubin, Direct 0.1 - 0.5 mg/dL - - -    Lab Results  Component Value Date/Time   TSH 1.950 03/15/2020 09:34 AM    CBC Latest Ref Rng & Units 03/15/2020 09/05/2016 08/15/2015  WBC 3.4 - 10.8 x10E3/uL 3.6 4.5 4.7  Hemoglobin 13.0 - 17.7 g/dL 14.4 15.1 15.4  Hematocrit 37.5 - 51.0 % 42.6 45.1 46.2  Platelets 150 - 450 x10E3/uL 131(L) 158 162    No results found for: VD25OH  Clinical ASCVD: No  The ASCVD Risk score Mikey Bussing DC Jr., et al., 2013) failed to calculate for the following reasons:   The 2013 ASCVD risk score is only valid for ages 68 to 59    Depression screen PHQ 2/9 11/03/2019 09/09/2019 02/17/2019  Decreased Interest 0 0 1  Down, Depressed, Hopeless 0 0 0  PHQ - 2 Score 0 0 1  Altered sleeping - - 0  Tired, decreased energy - - 2  Change in appetite - - 1  Feeling bad or failure about yourself  - - 0  Trouble concentrating - - 0  Moving slowly or fidgety/restless - - 0  Suicidal thoughts - - 0  PHQ-9 Score - - 4       Social History   Tobacco Use  Smoking Status Current Every Day Smoker  . Packs/day: 0.50  . Years: 23.00  . Pack years: 11.50  . Types: Cigarettes  Smokeless Tobacco Never Used  Tobacco Comment   since age 69   BP Readings from Last 3 Encounters:  03/15/20 137/75  11/15/19 94/61  10/14/19 122/82   Pulse Readings from Last 3 Encounters:  03/15/20 83  11/15/19 (!) 59  10/14/19 72   Wt Readings from Last 3 Encounters:  03/15/20 252 lb 12.8 oz (114.7 kg)  11/15/19 242 lb (109.8 kg)  10/14/19 246 lb 12.8 oz (111.9 kg)    Assessment/Interventions: Review of patient past medical history, allergies, medications, health status, including review of consultants reports, laboratory and other test data, was performed as part of comprehensive evaluation and provision of  chronic care management services.   SDOH:  (Social Determinants of Health) assessments and interventions performed: No   o       Medication Assistance: None required.  Patient affirms current coverage meets needs.  Patient's preferred pharmacy is:  [id number] - [place], [address] [place] [address] Phone: [phone] Fax: [phone]  Uses pill box? No - doesn't have one Pt endorses 90 % compliance  We discussed: Benefits of medication synchronization, packaging and delivery as well as enhanced pharmacist oversight with [employer]. Patient decided to: Continue current medication management strategy  Care Plan and Follow Up Patient Decision:  Patient agrees to Care Plan and Follow-up.  Plan: Telephone follow up appointment with care management team member scheduled for:  3 months  Junita Push. Kenton Kingfisher PharmD, [place] [facility] [phone]  .

## 2020-09-06 NOTE — Telephone Encounter (Signed)
Patient states he would like someone to reach out to him and schedule an appointment. Routed to front office staff.

## 2020-09-08 ENCOUNTER — Ambulatory Visit (INDEPENDENT_AMBULATORY_CARE_PROVIDER_SITE_OTHER): Payer: Medicare Other | Admitting: Licensed Clinical Social Worker

## 2020-09-08 DIAGNOSIS — K219 Gastro-esophageal reflux disease without esophagitis: Secondary | ICD-10-CM

## 2020-09-08 DIAGNOSIS — I10 Essential (primary) hypertension: Secondary | ICD-10-CM

## 2020-09-08 DIAGNOSIS — E1165 Type 2 diabetes mellitus with hyperglycemia: Secondary | ICD-10-CM | POA: Diagnosis not present

## 2020-09-08 DIAGNOSIS — F419 Anxiety disorder, unspecified: Secondary | ICD-10-CM

## 2020-09-08 NOTE — Patient Instructions (Signed)
Licensed Clinical Social Worker Visit Information  Goals we discussed today:  Goals Addressed            This Visit's Progress   . Track and Manage My Symptoms        Timeframe:  Long-Range Goal Priority:  Medium Start Date:   09/08/20                       Expected End Date:  12/08/20                    Follow Up Date- 10/23/20  - avoid negative self-talk - develop a personal safety plan - develop a plan to deal with triggers like holidays, anniversaries - exercise at least 2 to 3 times per week - have a plan for how to handle bad days - journal feelings and what helps to feel better or worse - spend time or talk with others at least 2 to 3 times per week - spend time or talk with others every day - watch for early signs of feeling worse - write in journal every day    Why is this important?   Keeping track of your progress will help your treatment team find the right mix of medicine and therapy for you.  Write in your journal every day.  Day-to-day changes in depression symptoms are normal. It may be more helpful to check your progress at the end of each week instead of every day.    Current Barriers:  . Acute Mental Health needs related to Stress and Anxiety . Financial constraints related to managing health care expenses . Limited social support . ADL IADL limitations . Mental Health Concerns  . Social Isolation . Lacks knowledge of community resource: available mental health resources that accept Medicare/Medicaid . Suicidal Ideation/Homicidal Ideation: No  Clinical Social Work Goal(s):  Marland Kitchen Over the next 120 days, patient will work with SW to address concerns related to lack of support/resource connection. LCSW will assist patient in gaining additional support/resource connection and community resource education in order to maintain health and mental health appropriately  . Over the next 120 days, patient will demonstrate improved adherence to self care as evidenced by  implementing healthy self-care into his daily routine such as: attending all medical appointments, deep breathing exercises, taking time for self-reflection, taking medications as prescribed, drinking water and daily exercise to improve mobility.  . Over the next 120 days, patient will demonstrate improved health management independence as evidenced by implementing healthy self-care skills and positive support/resources into his daily routine to help cope with stressors and improve overall health and well-being   Interventions: . Patient interviewed and appropriate assessments performed: GAD 7 . Provided mental health counseling with regard to anxiety management .  Patient was informed that current CCM LCSW will be leaving position next month and his next CCM Social Work follow up visit will be with another LCSW. Patient was appreciative of support provided and receptive to news . Provided patient with information about coping skills to implement into his daily routine to combat anxiety and panic. Marland Kitchen Patient has an upcoming PCP appointment on 09/12/20 at 10:40 am and he confirms that he will drive himself to this appointment next week.  . Discussed plans with patient for ongoing care management follow up and provided patient with direct contact information for care management team . Advised patient to check his email for list of available mental health support resources that  were sent out on 01/05/20 for him to revert back to. . Assisted patient/caregiver with obtaining information about health plan benefits . Encouraged patient to consider [facility] for long term follow up and therapy/counseling . Patient was alert, oriented x 4. Patient indicates stress around health. Patient and LCSW discussed a plan for smoking cessation. Patient does not wish to set a date to quit yet. LCSW discussed coping skills for future triggers. SW used active and reflective listening, validated patient's  feelings/concerns, and provided emotional support throughout entire session.  . Brief CBT and Solution Focused interventions provided during session to help cope with stressors and plan to discontinue cigarette usage with adding support within his network.  Marland Kitchen  LCSW discussed coping skills for anxiety. SW used empathetic and active and reflective listening, validated patient's feelings/concerns, and provided emotional support. LCSW provided self-care education to help manage his multiple health conditions and improve his mood. Development of long term plan of care and institution of self health management strategies completed. LCSW provided education on relaxation techniques such as meditation, deep breathing, massage, grounding exercsies or yoga that can activate the body's relaxation response and ease symptoms of stress and anxiety.   Depression screen [facility] 2/9 11/03/2019 09/09/2019 02/17/2019 11/14/2016  Decreased Interest 0 0 1 1  Down, Depressed, Hopeless 0 0 0 0  PHQ - 2 Score 0 0 1 1  Altered sleeping - - 0 -  Tired, decreased energy - - 2 -  Change in appetite - - 1 -  Feeling bad or failure about yourself  - - 0 -  Trouble concentrating - - 0 -  Moving slowly or fidgety/restless - - 0 -  Suicidal thoughts - - 0 -  PHQ-9 Score - - 4 -    Patient Self Care Activities:  . Self administers medications as prescribed . Attends all scheduled provider appointments . Performs ADL's independently . Performs IADL's independently . Calls provider office for new concerns or questions . Ability for insight . Independent living . Motivation for treatment  Patient Coping Strengths:  . Hopefulness . Self Advocate . Able to Communicate Effectively  Patient Self Care Deficits:  . Lacks social connections         Eula Fried, BSW, MSW, [email or web address] Phone: [phone]

## 2020-09-08 NOTE — Chronic Care Management (AMB) (Signed)
Chronic Care Management    Clinical Social Work Note  09/08/2020 Name: Steve [name] MRN: [id number] DOB: 04/08/1981  Steve [name] is a 40 y.o. year old male who is a primary care patient of Jon Billings, NP. The CCM team was consulted to assist the patient with chronic disease management and/or care coordination needs related to: [employer]  and [place] and Resources.   Engaged with patient by telephone for follow up visit in response to provider referral for social work chronic care management and care coordination services.   Consent to Services:  The patient was given the following information about Chronic Care Management services today, agreed to services, and gave verbal consent: 1. CCM service includes personalized support from designated clinical staff supervised by the primary care provider, including individualized plan of care and coordination with other care providers 2. 24/7 contact phone numbers for assistance for urgent and routine care needs. 3. Service will only be billed when office clinical staff spend 20 minutes or more in a month to coordinate care. 4. Only one practitioner may furnish and bill the service in a calendar month. 5.The patient may stop CCM services at any time (effective at the end of the month) by phone call to the office staff. 6. The patient will be responsible for cost sharing (co-pay) of up to 20% of the service fee (after annual deductible is met). Patient agreed to services and consent obtained.  Patient agreed to services and consent obtained.   Assessment: Review of patient past medical history, allergies, medications, and health status, including review of relevant consultants reports was performed today as part of a comprehensive evaluation and provision of chronic care management and care coordination services.     SDOH (Social Determinants of Health) assessments and interventions performed:    Advanced  Directives Status: Not addressed in this encounter.  CCM Care Plan  No Known Allergies  Outpatient Encounter Medications as of 09/08/2020  Medication Sig  . dexlansoprazole (DEXILANT) 60 MG capsule Take 1 capsule (60 mg total) by mouth daily.  . Dulaglutide (TRULICITY) 1.5 QA/8.3MH SOPN Inject 1.5 mg into the skin once a week.   No facility-administered encounter medications on file as of 09/08/2020.    Patient Active Problem List   Diagnosis Date Noted  . Diarrhea   . Loss of weight   . Polyp of duodenum   . Polyp of descending colon   . Gastroesophageal reflux disease 11/17/2018  . Hypertension 02/10/2017  . Type 2 diabetes mellitus with hyperglycemia, without long-term current use of insulin ([place]) 11/16/2016  . Tobacco use 11/14/2016    Conditions to be addressed/monitored: Depression; Limited social support, Mental Health Concerns  and Social Isolation  Care Plan : Depression (Adult)  Updates made by Greg Cutter, LCSW since 09/08/2020 12:00 AM    Problem: Depression Identification (Depression)     Long-Range Goal: Depressive Symptoms Identified   Start Date: 09/08/2020  Priority: Medium  Note:    Timeframe:  Long-Range Goal Priority:  Medium Start Date:   09/08/20                       Expected End Date:  12/08/20                    Follow Up Date- 10/23/20  - avoid negative self-talk - develop a personal safety plan - develop a plan to deal with triggers like holidays, anniversaries - exercise at  least 2 to 3 times per week - have a plan for how to handle bad days - journal feelings and what helps to feel better or worse - spend time or talk with others at least 2 to 3 times per week - spend time or talk with others every day - watch for early signs of feeling worse - write in journal every day    Why is this important?   Keeping track of your progress will help your treatment team find the right mix of medicine and therapy for you.  Write in your journal every  day.  Day-to-day changes in depression symptoms are normal. It may be more helpful to check your progress at the end of each week instead of every day.    Current Barriers:  . Acute Mental Health needs related to Stress and Anxiety . Financial constraints related to managing health care expenses . Limited social support . ADL IADL limitations . Mental Health Concerns  . Social Isolation . Lacks knowledge of community resource: available mental health resources that accept Medicare/Medicaid . Suicidal Ideation/Homicidal Ideation: No  Clinical Social Work Goal(s):  Marland Kitchen Over the next 120 days, patient will work with SW to address concerns related to lack of support/resource connection. LCSW will assist patient in gaining additional support/resource connection and community resource education in order to maintain health and mental health appropriately  . Over the next 120 days, patient will demonstrate improved adherence to self care as evidenced by implementing healthy self-care into his daily routine such as: attending all medical appointments, deep breathing exercises, taking time for self-reflection, taking medications as prescribed, drinking water and daily exercise to improve mobility.  . Over the next 120 days, patient will demonstrate improved health management independence as evidenced by implementing healthy self-care skills and positive support/resources into his daily routine to help cope with stressors and improve overall health and well-being   Interventions: . Patient interviewed and appropriate assessments performed: GAD 7 . Provided mental health counseling with regard to anxiety management .  Patient was informed that current CCM LCSW will be leaving position next month and his next CCM Social Work follow up visit will be with another LCSW. Patient was appreciative of support provided and receptive to news . Provided patient with information about coping skills to implement into his  daily routine to combat anxiety and panic. Marland Kitchen Patient has an upcoming PCP appointment on 09/12/20 at 10:40 am and he confirms that he will drive himself to this appointment next week.  . Discussed plans with patient for ongoing care management follow up and provided patient with direct contact information for care management team . Advised patient to check his email for list of available mental health support resources that were sent out on 01/05/20 for him to revert back to. . Assisted patient/caregiver with obtaining information about health plan benefits . Encouraged patient to consider [facility] for long term follow up and therapy/counseling . Patient was alert, oriented x 4. Patient indicates stress around health. Patient and LCSW discussed a plan for smoking cessation. Patient does not wish to set a date to quit yet. LCSW discussed coping skills for future triggers. SW used active and reflective listening, validated patient's feelings/concerns, and provided emotional support throughout entire session.  . Brief CBT and Solution Focused interventions provided during session to help cope with stressors and plan to discontinue cigarette usage with adding support within his network.  Marland Kitchen  LCSW discussed coping skills for anxiety.  SW used empathetic and active and reflective listening, validated patient's feelings/concerns, and provided emotional support. LCSW provided self-care education to help manage his multiple health conditions and improve his mood. Development of long term plan of care and institution of self health management strategies completed. LCSW provided education on relaxation techniques such as meditation, deep breathing, massage, grounding exercsies or yoga that can activate the body's relaxation response and ease symptoms of stress and anxiety.   Depression screen [facility] 2/9 11/03/2019 09/09/2019 02/17/2019 11/14/2016  Decreased Interest 0 0 1 1  Down, Depressed, Hopeless 0 0 0 0  PHQ  - 2 Score 0 0 1 1  Altered sleeping - - 0 -  Tired, decreased energy - - 2 -  Change in appetite - - 1 -  Feeling bad or failure about yourself  - - 0 -  Trouble concentrating - - 0 -  Moving slowly or fidgety/restless - - 0 -  Suicidal thoughts - - 0 -  PHQ-9 Score - - 4 -    Patient Self Care Activities:  . Self administers medications as prescribed . Attends all scheduled provider appointments . Performs ADL's independently . Performs IADL's independently . Calls provider office for new concerns or questions . Ability for insight . Independent living . Motivation for treatment  Patient Coping Strengths:  . Hopefulness . Self Advocate . Able to Communicate Effectively  Patient Self Care Deficits:  . Lacks social connections      Follow Up Plan: SW will follow up with patient by phone over the next 45 days      Eula Fried, [employer], MSW, [email or web address] Phone: [phone]

## 2020-09-11 NOTE — Progress Notes (Signed)
BP 135/85   Pulse 84   Temp 97.8 F (36.6 C)   Wt 252 lb (114.3 kg)   SpO2 97%   BMI 32.35 kg/m    Subjective:    Patient ID: Steve [name], male    DOB: September 09, 1980, 40 y.o.   MRN: [id number]  HPI: Steve [name] is a 40 y.o. male  Chief Complaint  Patient presents with  . Diabetes   HYPERTENSION / HYPERLIPIDEMIA Satisfied with current treatment? Declines ACE/ARB Duration of hypertension: years BP monitoring frequency: not checking BP range:  BP medication side effects: no Past BP meds: none Duration of hyperlipidemia: years Cholesterol medication side effects: no Cholesterol supplements: none Past cholesterol medications: none Medication compliance: none Aspirin: no Recent stressors: no Recurrent headaches: no Visual changes: no Palpitations: no Dyspnea: no Chest pain: no Lower extremity edema: no Dizzy/lightheaded: no  DIABETES Hypoglycemic episodes:no Polydipsia/polyuria: no Visual disturbance: no Chest pain: no Paresthesias: no Glucose Monitoring: no  Accucheck frequency: Not Checking  Fasting glucose:  Post prandial:  Evening:  Before meals: Taking Insulin?: no  Long acting insulin:  Short acting insulin: Blood Pressure Monitoring: not checking Retinal Examination: Up to Date Foot Exam: Up to Date Diabetic Education: Not Completed Pneumovax: Not up to Date Influenza: Not up to Date Aspirin: no  Relevant past medical, surgical, family and social history reviewed and updated as indicated. Interim medical history since our last visit reviewed. Allergies and medications reviewed and updated.  Review of Systems  Eyes: Negative for visual disturbance.  Respiratory: Negative for chest tightness and shortness of breath.   Cardiovascular: Negative for chest pain, palpitations and leg swelling.  Endocrine: Negative for polydipsia and polyuria.  Neurological: Negative for dizziness, light-headedness, numbness and headaches.    Per HPI  unless specifically indicated above     Objective:    BP 135/85   Pulse 84   Temp 97.8 F (36.6 C)   Wt 252 lb (114.3 kg)   SpO2 97%   BMI 32.35 kg/m   Wt Readings from Last 3 Encounters:  09/12/20 252 lb (114.3 kg)  03/15/20 252 lb 12.8 oz (114.7 kg)  11/15/19 242 lb (109.8 kg)    Physical Exam Vitals and nursing note reviewed.  Constitutional:      General: He is not in acute distress.    Appearance: Normal appearance. He is not ill-appearing, toxic-appearing or diaphoretic.  HENT:     Head: Normocephalic.     Right Ear: External ear normal.     Left Ear: External ear normal.     Nose: Nose normal. No congestion or rhinorrhea.     Mouth/Throat:     Mouth: Mucous membranes are moist.  Eyes:     General:        Right eye: No discharge.        Left eye: No discharge.     Extraocular Movements: Extraocular movements intact.     Conjunctiva/sclera: Conjunctivae normal.     Pupils: Pupils are equal, round, and reactive to light.  Cardiovascular:     Rate and Rhythm: Normal rate and regular rhythm.     Heart sounds: No murmur heard.   Pulmonary:     Effort: Pulmonary effort is normal. No respiratory distress.     Breath sounds: Normal breath sounds. No wheezing, rhonchi or rales.  Abdominal:     General: Abdomen is flat. Bowel sounds are normal.  Musculoskeletal:     Cervical back: Normal range of motion and neck  supple.  Skin:    General: Skin is warm and dry.     Capillary Refill: Capillary refill takes less than 2 seconds.  Neurological:     General: No focal deficit present.     Mental Status: He is alert and oriented to person, place, and time.  Psychiatric:        Mood and Affect: Mood normal.        Behavior: Behavior normal.        Thought Content: Thought content normal.        Judgment: Judgment normal.     Results for orders placed or performed in visit on 09/12/20  Lipid panel  Result Value Ref Range   Cholesterol, Total 251 (H) 100 - 199 mg/dL    Triglycerides 258 (H) 0 - 149 mg/dL   HDL 28 (L) >39 mg/dL   VLDL Cholesterol Cal 49 (H) 5 - 40 mg/dL   LDL Chol Calc (NIH) 174 (H) 0 - 99 mg/dL   Chol/HDL Ratio 9.0 (H) 0.0 - 5.0 ratio  Basic metabolic panel  Result Value Ref Range   Glucose 289 (H) 65 - 99 mg/dL   BUN 7 6 - 20 mg/dL   Creatinine, Ser 0.95 0.76 - 1.27 mg/dL   eGFR 104 >59 mL/min/1.73   BUN/Creatinine Ratio 7 (L) 9 - 20   Sodium 138 134 - 144 mmol/L   Potassium 4.4 3.5 - 5.2 mmol/L   Chloride 102 96 - 106 mmol/L   CO2 20 20 - 29 mmol/L   Calcium 9.1 8.7 - 10.2 mg/dL  Urinalysis, Routine w reflex microscopic  Result Value Ref Range   Specific Gravity, UA 1.020 1.005 - 1.030   pH, UA 6.5 5.0 - 7.5   Color, UA Yellow Yellow   Appearance Ur Clear Clear   Leukocytes,UA Negative Negative   Protein,UA Negative Negative/Trace   Glucose, UA 3+ (A) Negative   Ketones, UA Negative Negative   RBC, UA Negative Negative   Bilirubin, UA Negative Negative   Urobilinogen, Ur 4.0 (H) 0.2 - 1.0 mg/dL   Nitrite, UA Negative Negative  HgB A1c  Result Value Ref Range   Hgb A1c MFr Bld 9.9 (H) 4.8 - 5.6 %   Est. average glucose Bld gHb Est-mCnc 237 mg/dL  HIV Antibody (routine testing w rflx)  Result Value Ref Range   HIV Screen 4th Generation wRfx Non Reactive Non Reactive  Hepatitis C Antibody  Result Value Ref Range   Hep C Virus Ab <0.1 0.0 - 0.9 s/co ratio  RPR  Result Value Ref Range   RPR Ser Ql Non Reactive Non Reactive  HSV(herpes smplx)abs-1+2(IgG+IgM)-bld  Result Value Ref Range   HSVI/II Comb IgM WILL FOLLOW    HSV 1 Glycoprotein G Ab, IgG <0.91 0.00 - 0.90 index   HSV 2 IgG, Type Spec 5.19 (H) 0.00 - 0.90 index      Assessment & Plan:   Problem List Items Addressed This Visit      Cardiovascular and Mediastinum   Hypertension associated with diabetes (HCC)    Chronic.  Under good control on current regimen. Continue current regimen. Continue to monitor. Call with any concerns. Continue to monitor.  Declines ACE for DM.        Endocrine   Type 2 diabetes mellitus with hyperglycemia, without long-term current use of insulin (HCC) - Primary    Chronic.  Labs drawn today.  Patient has not been taking 8.8QB of Trulicity for very long.  Unable  to determine exactly how long.  Prefers the lower dose but encouraged to continue with the 1.77m.      Relevant Orders   Lipid panel (Completed)   Basic metabolic panel (Completed)   Urinalysis, Routine w reflex microscopic (Completed)   HgB A1c (Completed)   Hyperlipidemia associated with type 2 diabetes mellitus (HCC)    Chronic.  Advised patient to cook at home more.  Decrease amount of fast food he is eating.  Labs ordered today.  Will make further recommendations based on lab results.        Other Visit Diagnoses    Screening for STD (sexually transmitted disease)       Patient requested STD screening.  Labs ordered.  Will make recommendations based on results.    Relevant Orders   Chlamydia/Gonococcus/Trichomonas, NAA([facility])   HIV Antibody (routine testing w rflx) (Completed)   Hepatitis C Antibody (Completed)   RPR (Completed)   HSV(herpes smplx)abs-1+2(IgG+IgM)-bld (Completed)   Screening for HIV (human immunodeficiency virus)       Relevant Orders   HIV Antibody (routine testing w rflx) (Completed)       Follow up plan: Return in about 6 months (around 03/14/2021) for Physical and Fasting labs.

## 2020-09-12 ENCOUNTER — Ambulatory Visit (INDEPENDENT_AMBULATORY_CARE_PROVIDER_SITE_OTHER): Payer: Medicare Other | Admitting: Nurse Practitioner

## 2020-09-12 ENCOUNTER — Other Ambulatory Visit: Payer: Self-pay

## 2020-09-12 ENCOUNTER — Encounter: Payer: Self-pay | Admitting: Nurse Practitioner

## 2020-09-12 VITALS — BP 135/85 | HR 84 | Temp 97.8°F | Wt 252.0 lb

## 2020-09-12 DIAGNOSIS — E1169 Type 2 diabetes mellitus with other specified complication: Secondary | ICD-10-CM

## 2020-09-12 DIAGNOSIS — E1159 Type 2 diabetes mellitus with other circulatory complications: Secondary | ICD-10-CM

## 2020-09-12 DIAGNOSIS — E1165 Type 2 diabetes mellitus with hyperglycemia: Secondary | ICD-10-CM

## 2020-09-12 DIAGNOSIS — Z720 Tobacco use: Secondary | ICD-10-CM | POA: Diagnosis not present

## 2020-09-12 DIAGNOSIS — E785 Hyperlipidemia, unspecified: Secondary | ICD-10-CM

## 2020-09-12 DIAGNOSIS — I152 Hypertension secondary to endocrine disorders: Secondary | ICD-10-CM | POA: Diagnosis not present

## 2020-09-12 DIAGNOSIS — Z114 Encounter for screening for human immunodeficiency virus [HIV]: Secondary | ICD-10-CM | POA: Diagnosis not present

## 2020-09-12 DIAGNOSIS — Z113 Encounter for screening for infections with a predominantly sexual mode of transmission: Secondary | ICD-10-CM

## 2020-09-12 LAB — URINALYSIS, ROUTINE W REFLEX MICROSCOPIC
Bilirubin, UA: NEGATIVE
Ketones, UA: NEGATIVE
Leukocytes,UA: NEGATIVE
Nitrite, UA: NEGATIVE
Protein,UA: NEGATIVE
RBC, UA: NEGATIVE
Specific Gravity, UA: 1.02 (ref 1.005–1.030)
Urobilinogen, Ur: 4 mg/dL — ABNORMAL HIGH (ref 0.2–1.0)
pH, UA: 6.5 (ref 5.0–7.5)

## 2020-09-12 NOTE — Progress Notes (Signed)
Hi Steve [name]  Your urine shows a good amount of glucose in it.  This is likely due to your diabetes.  We will continue to monitor at future visits.

## 2020-09-13 DIAGNOSIS — E1169 Type 2 diabetes mellitus with other specified complication: Secondary | ICD-10-CM | POA: Insufficient documentation

## 2020-09-13 DIAGNOSIS — E785 Hyperlipidemia, unspecified: Secondary | ICD-10-CM | POA: Insufficient documentation

## 2020-09-13 LAB — LIPID PANEL
Chol/HDL Ratio: 9 ratio — ABNORMAL HIGH (ref 0.0–5.0)
Cholesterol, Total: 251 mg/dL — ABNORMAL HIGH (ref 100–199)
HDL: 28 mg/dL — ABNORMAL LOW (ref >39)
LDL Chol Calc (NIH): 174 mg/dL — ABNORMAL HIGH (ref 0–99)
Triglycerides: 258 mg/dL — ABNORMAL HIGH (ref 0–149)
VLDL Cholesterol Cal: 49 mg/dL — ABNORMAL HIGH (ref 5–40)

## 2020-09-13 NOTE — Assessment & Plan Note (Signed)
Chronic.  Advised patient to cook at home more.  Decrease amount of fast food he is eating.  Labs ordered today.  Will make further recommendations based on lab results.

## 2020-09-13 NOTE — Assessment & Plan Note (Signed)
Chronic.  Labs drawn today.  Patient has not been taking 1.5mg  of Trulicity for very long.  Unable to determine exactly how long.  Prefers the lower dose but encouraged to continue with the 1.5mg .

## 2020-09-13 NOTE — Assessment & Plan Note (Signed)
Chronic.  Under good control on current regimen. Continue current regimen. Continue to monitor. Call with any concerns. Continue to monitor. Declines ACE for DM.

## 2020-09-14 LAB — BASIC METABOLIC PANEL
BUN/Creatinine Ratio: 7 — ABNORMAL LOW (ref 9–20)
BUN: 7 mg/dL (ref 6–20)
CO2: 20 mmol/L (ref 20–29)
Calcium: 9.1 mg/dL (ref 8.7–10.2)
Chloride: 102 mmol/L (ref 96–106)
Creatinine, Ser: 0.95 mg/dL (ref 0.76–1.27)
Glucose: 289 mg/dL — ABNORMAL HIGH (ref 65–99)
Potassium: 4.4 mmol/L (ref 3.5–5.2)
Sodium: 138 mmol/L (ref 134–144)
eGFR: 104 mL/min/{1.73_m2} (ref >59)

## 2020-09-14 LAB — HIV ANTIBODY (ROUTINE TESTING W REFLEX): HIV Screen 4th Generation wRfx: NONREACTIVE

## 2020-09-14 LAB — HEPATITIS C ANTIBODY: Hep C Virus Ab: <0.1 s/co ratio (ref 0.0–0.9)

## 2020-09-14 LAB — CHLAMYDIA/GONOCOCCUS/TRICHOMONAS, NAA
Chlamydia by NAA: NEGATIVE
Gonococcus by NAA: NEGATIVE
Trich vag by NAA: NEGATIVE

## 2020-09-14 LAB — HSV(HERPES SMPLX)ABS-I+II(IGG+IGM)-BLD
HSV 1 Glycoprotein G Ab, IgG: <0.91 index (ref 0.00–0.90)
HSV 2 IgG, Type Spec: 5.19 index — ABNORMAL HIGH (ref 0.00–0.90)
HSVI/II Comb IgM: <0.91 Ratio (ref 0.00–0.90)

## 2020-09-14 LAB — HEMOGLOBIN A1C
Est. average glucose Bld gHb Est-mCnc: 237 mg/dL
Hgb A1c MFr Bld: 9.9 % — ABNORMAL HIGH (ref 4.8–5.6)

## 2020-09-14 LAB — RPR: RPR Ser Ql: NONREACTIVE

## 2020-09-14 MED ORDER — DAPAGLIFLOZIN PROPANEDIOL 5 MG PO TABS: 5.0000 mg | ORAL_TABLET | Freq: Every day | ORAL | 0 refills | Status: DC

## 2020-09-14 MED ORDER — TRULICITY 1.5 MG/0.5ML ~~LOC~~ SOAJ: 1.5000 mg | SUBCUTANEOUS | 1 refills | Status: DC

## 2020-09-14 NOTE — Addendum Note (Signed)
Addended by: Jon Billings on: 09/14/2020 03:35 PM   Modules accepted: Orders

## 2020-09-14 NOTE — Progress Notes (Signed)
Please call patient and let him know that his A1c is elevated to 9.9.  I have refilled his Trulicity 1.5mg .  Please advise patient that he needs to be diligent about taking it weekly and follow up with me in 8 weeks.  I have also sent him Wilder Glade to the pharmacy which will also help get his A1c under control.  I highly encourage patient to take both of these.   Please let patient know he is negative for and STIs.  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 If patient has questions about this please let me know.   Also, patient's cholesterol is still very elevated.  I recommend that he begin a statin medication.  Since are starting the [place] we can discuss this further at his follow up.  Please make him an appointment for 8 weeks.

## 2020-10-23 ENCOUNTER — Telehealth: Payer: Self-pay

## 2020-11-08 NOTE — Progress Notes (Signed)
BP 129/84   Pulse 86   Temp 98.5 F (36.9 C)   Ht 6' 0.8" (1.849 m)   Wt 244 lb (110.7 kg)   SpO2 99%   BMI 32.37 kg/m    Subjective:    Patient ID: Steve [name], male    DOB: 07-Dec-1980, 40 y.o.   MRN: [id number]  HPI: Steve [name] is a 40 y.o. male  Chief Complaint  Patient presents with   Diabetes   HYPERTENSION Hypertension status: controlled  Satisfied with current treatment? yes Duration of hypertension: years BP monitoring frequency:  not checking BP range:  BP medication side effects:  no Medication compliance: excellent compliance Previous BP meds: not currently on any medications Aspirin: no Recurrent headaches: no Visual changes: no Palpitations: no Dyspnea: no Chest pain: no Lower extremity edema: no Dizzy/lightheaded: no  DIABETES Hypoglycemic episodes:no Polydipsia/polyuria: no Visual disturbance: no Chest pain: no Paresthesias: no Glucose Monitoring: no  Accucheck frequency: Not Checking  Fasting glucose:  Post prandial:  Evening:  Before meals: Taking Insulin?: no  Long acting insulin:  Short acting insulin: Blood Pressure Monitoring: not checking Retinal Examination: Not up to date Foot Exam: Up to Date Diabetic Education: Not Completed Pneumovax: Not up to Date Influenza: unknown Aspirin: no  Patient states he was recently exposed to Trichomonas.  Patient would like to be tested and treated.  Denies any symptoms.  Relevant past medical, surgical, family and social history reviewed and updated as indicated. Interim medical history since our last visit reviewed. Allergies and medications reviewed and updated.  Review of Systems  Eyes:  Negative for visual disturbance.  Respiratory:  Negative for chest tightness and shortness of breath.   Cardiovascular:  Negative for chest pain, palpitations and leg swelling.  Endocrine: Negative for polydipsia and polyuria.  Neurological:  Negative for dizziness, light-headedness,  numbness and headaches.   Per HPI unless specifically indicated above     Objective:    BP 129/84   Pulse 86   Temp 98.5 F (36.9 C)   Ht 6' 0.8" (1.849 m)   Wt 244 lb (110.7 kg)   SpO2 99%   BMI 32.37 kg/m   Wt Readings from Last 3 Encounters:  11/09/20 244 lb (110.7 kg)  09/12/20 252 lb (114.3 kg)  03/15/20 252 lb 12.8 oz (114.7 kg)    Physical Exam Vitals and nursing note reviewed.  Constitutional:      General: He is not in acute distress.    Appearance: Normal appearance. He is not ill-appearing, toxic-appearing or diaphoretic.  HENT:     Head: Normocephalic.     Right Ear: External ear normal.     Left Ear: External ear normal.     Nose: Nose normal. No congestion or rhinorrhea.     Mouth/Throat:     Mouth: Mucous membranes are moist.  Eyes:     General:        Right eye: No discharge.        Left eye: No discharge.     Extraocular Movements: Extraocular movements intact.     Conjunctiva/sclera: Conjunctivae normal.     Pupils: Pupils are equal, round, and reactive to light.  Cardiovascular:     Rate and Rhythm: Normal rate and regular rhythm.     Heart sounds: No murmur heard. Pulmonary:     Effort: Pulmonary effort is normal. No respiratory distress.     Breath sounds: Normal breath sounds. No wheezing, rhonchi or rales.  Abdominal:  General: Abdomen is flat. Bowel sounds are normal.  Musculoskeletal:     Cervical back: Normal range of motion and neck supple.  Skin:    General: Skin is warm and dry.     Capillary Refill: Capillary refill takes less than 2 seconds.  Neurological:     General: No focal deficit present.     Mental Status: He is alert and oriented to person, place, and time.  Psychiatric:        Mood and Affect: Mood normal.        Behavior: Behavior normal.        Thought Content: Thought content normal.        Judgment: Judgment normal.    Results for orders placed or performed in visit on 09/12/20   Chlamydia/Gonococcus/Trichomonas, NAA([facility])   Specimen: Urine   UR  Result Value Ref Range   Chlamydia by NAA Negative Negative   Gonococcus by NAA Negative Negative   Trich vag by NAA Negative Negative  Lipid panel  Result Value Ref Range   Cholesterol, Total 251 (H) 100 - 199 mg/dL   Triglycerides 258 (H) 0 - 149 mg/dL   HDL 28 (L) >39 mg/dL   VLDL Cholesterol Cal 49 (H) 5 - 40 mg/dL   LDL Chol Calc (NIH) 174 (H) 0 - 99 mg/dL   Chol/HDL Ratio 9.0 (H) 0.0 - 5.0 ratio  Basic metabolic panel  Result Value Ref Range   Glucose 289 (H) 65 - 99 mg/dL   BUN 7 6 - 20 mg/dL   Creatinine, Ser 0.95 0.76 - 1.27 mg/dL   eGFR 104 >59 mL/min/1.73   BUN/Creatinine Ratio 7 (L) 9 - 20   Sodium 138 134 - 144 mmol/L   Potassium 4.4 3.5 - 5.2 mmol/L   Chloride 102 96 - 106 mmol/L   CO2 20 20 - 29 mmol/L   Calcium 9.1 8.7 - 10.2 mg/dL  Urinalysis, Routine w reflex microscopic  Result Value Ref Range   Specific Gravity, UA 1.020 1.005 - 1.030   pH, UA 6.5 5.0 - 7.5   Color, UA Yellow Yellow   Appearance Ur Clear Clear   Leukocytes,UA Negative Negative   Protein,UA Negative Negative/Trace   Glucose, UA 3+ (A) Negative   Ketones, UA Negative Negative   RBC, UA Negative Negative   Bilirubin, UA Negative Negative   Urobilinogen, Ur 4.0 (H) 0.2 - 1.0 mg/dL   Nitrite, UA Negative Negative  HgB A1c  Result Value Ref Range   Hgb A1c MFr Bld 9.9 (H) 4.8 - 5.6 %   Est. average glucose Bld gHb Est-mCnc 237 mg/dL  HIV Antibody (routine testing w rflx)  Result Value Ref Range   HIV Screen 4th Generation wRfx Non Reactive Non Reactive  Hepatitis C Antibody  Result Value Ref Range   Hep C Virus Ab <0.1 0.0 - 0.9 s/co ratio  RPR  Result Value Ref Range   RPR Ser Ql Non Reactive Non Reactive  HSV(herpes smplx)abs-1+2(IgG+IgM)-bld  Result Value Ref Range   HSVI/II Comb IgM <0.91 0.00 - 0.90 Ratio   HSV 1 Glycoprotein G Ab, IgG <0.91 0.00 - 0.90 index   HSV 2 IgG, Type Spec 5.19 (H) 0.00 - 0.90  index      Assessment & Plan:   Problem List Items Addressed This Visit       Cardiovascular and Mediastinum   Hypertension associated with diabetes ([place]) - Primary    Chronic.  Under good control on current regimen. Continue  current regimen. Continue to monitor. Call with any concerns. Continue to monitor. Declines ACE for kidney protection in the setting of DM.       Relevant Medications   dapagliflozin propanediol (FARXIGA) 10 MG TABS tablet     Endocrine   Type 2 diabetes mellitus with hyperglycemia, without long-term current use of insulin (HCC)    Chronic.  Ongoing.  Patient is not checking blood sugars. Increased Farxiga to $RemoveBe'10mg'[id number]$  to maximize effects.  Will follow up in 2 months and check A1c at that time.  Call sooner if concerns arise.        Relevant Medications   dapagliflozin propanediol (FARXIGA) 10 MG TABS tablet   Other Visit Diagnoses     STD exposure       Complete course of Flagyl due to exposure to Trichomonas. Labs ordered today.    Relevant Orders   Chlamydia/Gonococcus/Trichomonas, NAA([facility])        Follow up plan: Return in about 2 months (around 01/09/2021) for HTN, HLD, DM2 FU.

## 2020-11-09 ENCOUNTER — Ambulatory Visit (INDEPENDENT_AMBULATORY_CARE_PROVIDER_SITE_OTHER): Payer: Medicare Other | Admitting: Nurse Practitioner

## 2020-11-09 ENCOUNTER — Encounter: Payer: Self-pay | Admitting: Nurse Practitioner

## 2020-11-09 ENCOUNTER — Other Ambulatory Visit: Payer: Self-pay

## 2020-11-09 VITALS — BP 129/84 | HR 86 | Temp 98.5°F | Ht 72.8 in | Wt 244.0 lb

## 2020-11-09 DIAGNOSIS — Z202 Contact with and (suspected) exposure to infections with a predominantly sexual mode of transmission: Secondary | ICD-10-CM | POA: Diagnosis not present

## 2020-11-09 DIAGNOSIS — E1159 Type 2 diabetes mellitus with other circulatory complications: Secondary | ICD-10-CM

## 2020-11-09 DIAGNOSIS — I152 Hypertension secondary to endocrine disorders: Secondary | ICD-10-CM | POA: Diagnosis not present

## 2020-11-09 DIAGNOSIS — E1165 Type 2 diabetes mellitus with hyperglycemia: Secondary | ICD-10-CM | POA: Diagnosis not present

## 2020-11-09 MED ORDER — METRONIDAZOLE 500 MG PO TABS
500.0000 mg | ORAL_TABLET | Freq: Two times a day (BID) | ORAL | 0 refills | Status: AC
Start: 2020-11-09 — End: 2020-11-16

## 2020-11-09 MED ORDER — DAPAGLIFLOZIN PROPANEDIOL 10 MG PO TABS
10.0000 mg | ORAL_TABLET | Freq: Every day | ORAL | 1 refills | Status: DC
Start: 2020-11-09 — End: 2022-11-18

## 2020-11-09 NOTE — Assessment & Plan Note (Signed)
Chronic.  Ongoing.  Patient is not checking blood sugars. Increased Farxiga to 10mg  to maximize effects.  Will follow up in 2 months and check A1c at that time.  Call sooner if concerns arise.

## 2020-11-09 NOTE — Assessment & Plan Note (Addendum)
Chronic.  Under good control on current regimen. Continue current regimen. Continue to monitor. Call with any concerns. Continue to monitor. Declines ACE for kidney protection in the setting of DM.

## 2020-11-10 ENCOUNTER — Ambulatory Visit (INDEPENDENT_AMBULATORY_CARE_PROVIDER_SITE_OTHER): Payer: Medicare Other | Admitting: Licensed Clinical Social Worker

## 2020-11-10 DIAGNOSIS — E1165 Type 2 diabetes mellitus with hyperglycemia: Secondary | ICD-10-CM | POA: Diagnosis not present

## 2020-11-10 DIAGNOSIS — E1159 Type 2 diabetes mellitus with other circulatory complications: Secondary | ICD-10-CM | POA: Diagnosis not present

## 2020-11-10 DIAGNOSIS — I152 Hypertension secondary to endocrine disorders: Secondary | ICD-10-CM | POA: Diagnosis not present

## 2020-11-10 DIAGNOSIS — F419 Anxiety disorder, unspecified: Secondary | ICD-10-CM

## 2020-11-10 NOTE — Chronic Care Management (AMB) (Signed)
    Clinical Social Work  Chronic Care Management   Phone Outreach    11/10/2020 Name: Steve [name] MRN: [id number] DOB: Feb 12, 1981  Steve [name] is a 40 y.o. year old male who is a primary care patient of Jon Billings, NP .   CCM LCSW reached out to patient today by phone to introduce self, assess needs and offer Care Management services and interventions.    Unable to keep phone appointment today and requested to reschedule.  Plan:Appointment was rescheduled with CCM LCSW  for 11/16/20  Review of patient status, including review of consultants reports, relevant laboratory and other test results, and collaboration with appropriate care team members and the patient's provider was performed as part of comprehensive patient evaluation and provision of care management services.    Christa See, MSW, [email or web address] Phone [phone] 10:37 AM

## 2020-11-12 LAB — CHLAMYDIA/GONOCOCCUS/TRICHOMONAS, NAA
Chlamydia by NAA: NEGATIVE
Gonococcus by NAA: NEGATIVE
Trich vag by NAA: NEGATIVE

## 2020-11-13 NOTE — Progress Notes (Signed)
Hi Steve [name]  Your STD testing was negative. Complete the course of antibiotics due to exposure.  Let me know if you have any questions.

## 2020-11-16 ENCOUNTER — Telehealth: Payer: Self-pay

## 2020-11-16 ENCOUNTER — Telehealth: Payer: Self-pay | Admitting: Licensed Clinical Social Worker

## 2020-11-16 NOTE — Telephone Encounter (Signed)
    Clinical Social Work  Care Management   Phone Outreach    11/16/2020 Name: Steve [name] MRN: [id number] DOB: 1980/12/02  Steve [name] is a 40 y.o. year old male who is a primary care patient of Jon Billings, NP .   CCM LCSW reached out to patient today by phone to introduce self, assess needs and offer Care Management services and interventions.    Telephone outreach was unsuccessful  Plan:CCM LCSW will wait for return call. If no return call is received, Will route chart to Care Guide to see if patient would like to reschedule phone appointment   Review of patient status, including review of consultants reports, relevant laboratory and other test results, and collaboration with appropriate care team members and the patient's provider was performed as part of comprehensive patient evaluation and provision of care management services.    Christa See, MSW, [name] Phone [phone] 4:21 PM

## 2020-11-27 ENCOUNTER — Ambulatory Visit (INDEPENDENT_AMBULATORY_CARE_PROVIDER_SITE_OTHER): Payer: Medicare Other

## 2020-11-27 VITALS — Ht 73.0 in | Wt 245.0 lb

## 2020-11-27 DIAGNOSIS — Z0000 Encounter for general adult medical examination without abnormal findings: Secondary | ICD-10-CM | POA: Diagnosis not present

## 2020-11-27 NOTE — Progress Notes (Signed)
I connected with Eduard Roux today by telephone and verified that I am speaking with the correct person using two identifiers. Location patient: home Location provider: work Persons participating in the virtual visit: Reuel, [place] LPN.   I discussed the limitations, risks, security and privacy concerns of performing an evaluation and management service by telephone and the availability of in person appointments. I also discussed with the patient that there may be a patient responsible charge related to this service. The patient expressed understanding and verbally consented to this telephonic visit.    Interactive audio and video telecommunications were attempted between this provider and patient, however failed, due to patient having technical difficulties OR patient did not have access to video capability.  We continued and completed visit with audio only.     Vital signs may be patient reported or missing.  Subjective:   Steve [name] is a 40 y.o. male who presents for Medicare Annual/Subsequent preventive examination.  Review of Systems     Cardiac Risk Factors include: diabetes mellitus;dyslipidemia;hypertension;male gender;obesity (BMI >30kg/m2)     Objective:    Today's Vitals   11/27/20 1027  Weight: 245 lb (111.1 kg)  Height: 6\' 1"  (1.854 m)   Body mass index is 32.32 kg/m.  Advanced Directives 11/27/2020 11/15/2019 09/09/2019 09/05/2016 11/20/2015 08/15/2015  Does Patient Have a Medical Advance Directive? No Yes No No No No  Type of Advance Directive - [place] in Chart? - No - copy requested - - - -    Current Medications (verified) Outpatient Encounter Medications as of 11/27/2020  Medication Sig   dapagliflozin propanediol (FARXIGA) 10 MG TABS tablet Take 1 tablet (10 mg total) by mouth daily before breakfast.   dexlansoprazole (DEXILANT) 60 MG capsule Take 1 capsule (60 mg  total) by mouth daily.   Dulaglutide (TRULICITY) 1.5 HK/7.4QV SOPN Inject 1.5 mg into the skin once a week.   No facility-administered encounter medications on file as of 11/27/2020.    Allergies (verified) Patient has no known allergies.   History: Past Medical History:  Diagnosis Date   Diabetes mellitus without complication (HCC)    GERD (gastroesophageal reflux disease)    History of gastritis 09/2016   Hypertension    Past Surgical History:  Procedure Laterality Date   COLONOSCOPY WITH PROPOFOL N/A 11/15/2019   Procedure: COLONOSCOPY WITH PROPOFOL WITH POLYPECTOMY, BIOPSIES;  Surgeon: Lucilla Lame, MD;  Location: [place];  Service: Endoscopy;  Laterality: N/A;   ESOPHAGOGASTRODUODENOSCOPY (EGD) WITH PROPOFOL N/A 11/15/2019   Procedure: ESOPHAGOGASTRODUODENOSCOPY (EGD) WITH PROPOFOL WITH POLYPECTOMY, BIOPSIES;  Surgeon: Lucilla Lame, MD;  Location: [place];  Service: Endoscopy;  Laterality: N/A;   TOOTH EXTRACTION     Family History  Problem Relation Age of Onset   Diabetes Brother    Hypertension Brother    Diabetes Maternal Aunt    Hypertension Maternal Aunt    Cancer Maternal Aunt        pancreas   Diabetes Maternal Uncle    Hypertension Maternal Uncle    Cancer Maternal Uncle        bone   Diabetes Maternal Grandmother    Cancer Maternal Grandmother    Diabetes Maternal Grandfather    Cancer Maternal Uncle        lung   COPD Neg Hx    Heart disease Neg Hx    Stroke Neg Hx    Social History  Socioeconomic History   Marital status: Single    Spouse name: Not on file   Number of children: Not on file   Years of education: Not on file   Highest education level: Not on file  Occupational History   Not on file  Tobacco Use   Smoking status: Every Day    Packs/day: 0.50    Years: 23.00    Pack years: 11.50    Types: Cigarettes   Smokeless tobacco: Never   Tobacco comments:    since age 22  Vaping Use   Vaping Use: Never used   Substance and Sexual Activity   Alcohol use: No   Drug use: Yes    Types: Marijuana   Sexual activity: Not on file  Other Topics Concern   Not on file  Social History Narrative   Not on file   Social Determinants of Health   Financial Resource Strain: Medium Risk   Difficulty of Paying Living Expenses: Somewhat hard  Food Insecurity: Food Insecurity Present   Worried About Running Out of Food in the Last Year: Sometimes true   Ran Out of Food in the Last Year: Sometimes true  Transportation Needs: No Transportation Needs   Lack of Transportation (Medical): No   Lack of Transportation (Non-Medical): No  Physical Activity: Sufficiently Active   Days of Exercise per Week: 3 days   Minutes of Exercise per Session: 90 min  Stress: No Stress Concern Present   Feeling of Stress : Only a little  Social Connections: Not on file    Tobacco Counseling Ready to quit: Yes Counseling given: Not Answered Tobacco comments: since age 62   Clinical Intake:  Pre-visit preparation completed: Yes  Pain : No/denies pain     Nutritional Status: BMI > 30  Obese Nutritional Risks: Nausea/ vomitting/ diarrhea (a little nausea) Diabetes: Yes  How often do you need to have someone help you when you read instructions, pamphlets, or other written materials from your doctor or pharmacy?: 1 - Never What is the last grade level you completed in school?: 10th grade  Diabetic? Yes Nutrition Risk Assessment:  Has the patient had any N/V/D within the last 2 months?  Yes  Does the patient have any non-healing wounds?  No  Has the patient had any unintentional weight loss or weight gain?  No   Diabetes:  Is the patient diabetic?  Yes  If diabetic, was a CBG obtained today?  No  Did the patient bring in their glucometer from home?  No  How often do you monitor your CBG's? Does not.   Financial Strains and Diabetes Management:  Are you having any financial strains with the device, your  supplies or your medication? No .  Does the patient want to be seen by Chronic Care Management for management of their diabetes?  No  Would the patient like to be referred to a Nutritionist or for Diabetic Management?  No   Diabetic Exams:  Diabetic Eye Exam: Overdue for diabetic eye exam. Pt has been advised about the importance in completing this exam. Patient advised to call and schedule an eye exam. Diabetic Foot Exam: Completed 03/15/2020   Interpreter Needed?: No  Information entered by :: NAllen LPN   Activities of Daily Living In your present state of health, do you have any difficulty performing the following activities: 11/27/2020  Hearing? N  Vision? Y  Comment astigmatism  Difficulty concentrating or making decisions? N  Walking or climbing stairs? N  Dressing or bathing? N  Doing errands, shopping? N  Preparing Food and eating ? N  Using the Toilet? N  In the past six months, have you accidently leaked urine? N  Do you have problems with loss of bowel control? Y  Comment 2 or 3 times  Managing your Medications? N  Managing your Finances? N  Housekeeping or managing your Housekeeping? N  Some recent data might be hidden    Patient Care Team: Jon Billings, NP as PCP - General [place] Junita Push, [facility] (Pharmacist) Rebekah Chesterfield, LCSW as Social Worker (Licensed Clinical Social Worker)  Indicate any recent [employer] you may have received from other than [facility] providers in the past year (date may be approximate).     Assessment:   This is a routine wellness examination for Zane.  Hearing/Vision screen Vision Screening - Comments:: Regular eye exams, [facility]  Dietary issues and exercise activities discussed: Current Exercise Habits: Home exercise routine, Type of exercise: strength training/weights;treadmill, Time (Minutes): > 60, Frequency (Times/Week): 3, Weekly Exercise (Minutes/Week): 0   Goals Addressed             This  Visit's Progress    Patient Stated       6/27/20222, get healthier        Depression Screen PHQ 2/9 Scores 11/27/2020 11/09/2020 11/03/2019 09/09/2019 02/17/2019 11/14/2016  PHQ - 2 Score 0 1 0 0 1 1  PHQ- 9 Score - 6 - - 4 -    Fall Risk Fall Risk  11/27/2020 09/09/2019 02/17/2019  Falls in the past year? 0 1 0  Number falls in past yr: - 0 -  Injury with Fall? - 0 -  Risk for fall due to : Medication side effect - -  Follow up Falls evaluation completed;Education provided;Falls prevention discussed - Falls evaluation completed    FALL RISK PREVENTION PERTAINING TO THE HOME:  Any stairs in or around the home? No  If so, are there any without handrails? N/a Home free of loose throw rugs in walkways, pet beds, electrical cords, etc? Yes  Adequate lighting in your home to reduce risk of falls? Yes   ASSISTIVE DEVICES UTILIZED TO PREVENT FALLS:  Life alert? No  Use of a cane, walker or w/c? No  Grab bars in the bathroom? No  Shower chair or bench in shower? No  Elevated toilet seat or a handicapped toilet? Yes   TIMED UP AND GO:  Was the test performed? No .      Cognitive Function:     6CIT Screen 11/27/2020  What Year? 0 points  What month? 0 points  What time? 0 points  Count back from 20 0 points  Months in reverse 0 points  Repeat phrase 10 points  Total Score 10    Immunizations  There is no immunization history on file for this patient.  TDAP status: Due, Education has been provided regarding the importance of this vaccine. Advised may receive this vaccine at local pharmacy or Health Dept. Aware to provide a copy of the vaccination record if obtained from local pharmacy or Health Dept. Verbalized acceptance and understanding.  Flu Vaccine status: Declined, Education has been provided regarding the importance of this vaccine but patient still declined. Advised may receive this vaccine at local pharmacy or Health Dept. Aware to provide a copy of the vaccination  record if obtained from local pharmacy or Health Dept. Verbalized acceptance and understanding.  Pneumococcal vaccine status: Declined,  Education  has been provided regarding the importance of this vaccine but patient still declined. Advised may receive this vaccine at local pharmacy or Health Dept. Aware to provide a copy of the vaccination record if obtained from local pharmacy or Health Dept. Verbalized acceptance and understanding.   Covid-19 vaccine status: Declined, Education has been provided regarding the importance of this vaccine but patient still declined. Advised may receive this vaccine at local pharmacy or Health Dept.or vaccine clinic. Aware to provide a copy of the vaccination record if obtained from local pharmacy or Health Dept. Verbalized acceptance and understanding.  Qualifies for Shingles Vaccine? No   Zostavax completed n/a   Shingrix Completed?: n/a  Screening Tests Health Maintenance  Topic Date Due   COVID-19 Vaccine (1) Never done   OPHTHALMOLOGY EXAM  11/20/2018   TETANUS/TDAP  03/15/2021 (Originally 02/25/2000)   Pneumococcal Vaccine 75-15 Years old (1 - PCV) 11/09/2021 (Originally 02/25/1987)   PNEUMOCOCCAL POLYSACCHARIDE VACCINE AGE 51-64 HIGH RISK  11/09/2021 (Originally 02/25/1983)   INFLUENZA VACCINE  01/01/2021   HEMOGLOBIN A1C  03/14/2021   FOOT EXAM  03/15/2021   URINE MICROALBUMIN  03/15/2021   COLONOSCOPY (Pts 45-65yrs Insurance coverage will need to be confirmed)  11/14/2024   Hepatitis C Screening  Completed   HIV Screening  Completed   HPV VACCINES  Aged Out    Health Maintenance  Health Maintenance Due  Topic Date Due   COVID-19 Vaccine (1) Never done   OPHTHALMOLOGY EXAM  11/20/2018    Colorectal cancer screening: Type of screening: Colonoscopy. Completed 11/15/2019. Repeat every 5 years  Lung Cancer Screening: (Low Dose CT Chest recommended if Age 45-80 years, 30 pack-year currently smoking OR have quit w/in 15years.) does not qualify.    Lung Cancer Screening Referral: no  Additional Screening:  Hepatitis C Screening: does qualify; Completed 09/12/2020  Vision Screening: Recommended annual ophthalmology exams for early detection of glaucoma and other disorders of the eye. Is the patient up to date with their annual eye exam?  No  Who is the provider or what is the name of the office in which the patient attends annual eye exams? [facility] If pt is not established with a provider, would they like to be referred to a provider to establish care? No .   Dental Screening: Recommended annual dental exams for proper oral hygiene  Community Resource Referral / Chronic Care Management: CRR required this visit?  No   CCM required this visit?  No      Plan:     I have personally reviewed and noted the following in the patient's chart:   Medical and social history Use of alcohol, tobacco or illicit drugs  Current medications and supplements including opioid prescriptions. Patient is not currently taking opioid prescriptions. Functional ability and status Nutritional status Physical activity Advanced directives List of other physicians Hospitalizations, surgeries, and ER visits in previous 12 months Vitals Screenings to include cognitive, depression, and falls Referrals and appointments  In addition, I have reviewed and discussed with patient certain preventive protocols, quality metrics, and best practice recommendations. A written personalized care plan for preventive services as well as general preventive health recommendations were provided to patient.     Kellie Simmering, LPN   1/51/7616   Nurse Notes:

## 2020-11-27 NOTE — Addendum Note (Signed)
Addended by: Kellie Simmering on: 11/27/2020 02:03 PM   Modules accepted: Orders

## 2020-11-27 NOTE — Patient Instructions (Signed)
[name] , Thank you for taking time to come for your Medicare Wellness Visit. I appreciate your ongoing commitment to your health goals. Please review the following plan we discussed and let me know if I can assist you in the future.   Screening recommendations/referrals: Colonoscopy: completed 11/15/2019, due 11/14/2024 Recommended yearly ophthalmology/optometry visit for glaucoma screening and checkup Recommended yearly dental visit for hygiene and checkup  Vaccinations: Influenza vaccine: decline Pneumococcal vaccine: decline Tdap vaccine: decline Shingles vaccine:  n/a   Covid-19:  decline  Advanced directives: Advance directive discussed with you today.   Conditions/risks identified: smoking  Next appointment: Follow up in one year for your annual wellness visit   Preventive Care 40-64 Years, Male Preventive care refers to lifestyle choices and visits with your health care provider that can promote health and wellness. What does preventive care include? A yearly physical exam. This is also called an annual well check. Dental exams once or twice a year. Routine eye exams. Ask your health care provider how often you should have your eyes checked. Personal lifestyle choices, including: Daily care of your teeth and gums. Regular physical activity. Eating a healthy diet. Avoiding tobacco and drug use. Limiting alcohol use. Practicing safe sex. Taking low-dose aspirin every day starting at age 63. What happens during an annual well check? The services and screenings done by your health care provider during your annual well check will depend on your age, overall health, lifestyle risk factors, and family history of disease. Counseling  Your health care provider may ask you questions about your: Alcohol use. Tobacco use. Drug use. Emotional well-being. Home and relationship well-being. Sexual activity. Eating habits. Work and work Statistician. Screening  You may have  the following tests or measurements: Height, weight, and BMI. Blood pressure. Lipid and cholesterol levels. These may be checked every 5 years, or more frequently if you are over 62 years old. Skin check. Lung cancer screening. You may have this screening every year starting at age 41 if you have a 30-pack-year history of smoking and currently smoke or have quit within the past 15 years. Fecal occult blood test (FOBT) of the stool. You may have this test every year starting at age 108. Flexible sigmoidoscopy or colonoscopy. You may have a sigmoidoscopy every 5 years or a colonoscopy every 10 years starting at age 62. Prostate cancer screening. Recommendations will vary depending on your family history and other risks. Hepatitis C blood test. Hepatitis B blood test. Sexually transmitted disease (STD) testing. Diabetes screening. This is done by checking your blood sugar (glucose) after you have not eaten for a while (fasting). You may have this done every 1-3 years. Discuss your test results, treatment options, and if necessary, the need for more tests with your health care provider. Vaccines  Your health care provider may recommend certain vaccines, such as: Influenza vaccine. This is recommended every year. Tetanus, diphtheria, and acellular pertussis (Tdap, Td) vaccine. You may need a Td booster every 10 years. Zoster vaccine. You may need this after age 86. Pneumococcal 13-valent conjugate (PCV13) vaccine. You may need this if you have certain conditions and have not been vaccinated. Pneumococcal polysaccharide (PPSV23) vaccine. You may need one or two doses if you smoke cigarettes or if you have certain conditions. Talk to your health care provider about which screenings and vaccines you need and how often you need them. This information is not intended to replace advice given to you by your health care provider. Make sure you  discuss any questions you have with your health care  provider. Document Released: 06/16/2015 Document Revised: 02/07/2016 Document Reviewed: 03/21/2015 Elsevier Interactive Patient Education  2017 [employer] Prevention in the Home Falls can cause injuries. They can happen to people of all ages. There are many things you can do to make your home safe and to help prevent falls. What can I do on the outside of my home? Regularly fix the edges of walkways and driveways and fix any cracks. Remove anything that might make you trip as you walk through a door, such as a raised step or threshold. Trim any bushes or trees on the path to your home. Use bright outdoor lighting. Clear any walking paths of anything that might make someone trip, such as rocks or tools. Regularly check to see if handrails are loose or broken. Make sure that both sides of any steps have handrails. Any raised decks and porches should have guardrails on the edges. Have any leaves, snow, or ice cleared regularly. Use sand or salt on walking paths during winter. Clean up any spills in your garage right away. This includes oil or grease spills. What can I do in the bathroom? Use night lights. Install grab bars by the toilet and in the tub and shower. Do not use towel bars as grab bars. Use non-skid mats or decals in the tub or shower. If you need to sit down in the shower, use a plastic, non-slip stool. Keep the floor dry. Clean up any water that spills on the floor as soon as it happens. Remove soap buildup in the tub or shower regularly. Attach bath mats securely with double-sided non-slip rug tape. Do not have throw rugs and other things on the floor that can make you trip. What can I do in the bedroom? Use night lights. Make sure that you have a light by your bed that is easy to reach. Do not use any sheets or blankets that are too big for your bed. They should not hang down onto the floor. Have a firm chair that has side arms. You can use this for support while  you get dressed. Do not have throw rugs and other things on the floor that can make you trip. What can I do in the kitchen? Clean up any spills right away. Avoid walking on wet floors. Keep items that you use a lot in easy-to-reach places. If you need to reach something above you, use a strong step stool that has a grab bar. Keep electrical cords out of the way. Do not use floor polish or wax that makes floors slippery. If you must use wax, use non-skid floor wax. Do not have throw rugs and other things on the floor that can make you trip. What can I do with my stairs? Do not leave any items on the stairs. Make sure that there are handrails on both sides of the stairs and use them. Fix handrails that are broken or loose. Make sure that handrails are as long as the stairways. Check any carpeting to make sure that it is firmly attached to the stairs. Fix any carpet that is loose or worn. Avoid having throw rugs at the top or bottom of the stairs. If you do have throw rugs, attach them to the floor with carpet tape. Make sure that you have a light switch at the top of the stairs and the bottom of the stairs. If you do not have them, ask someone to  add them for you. What else can I do to help prevent falls? Wear shoes that: Do not have high heels. Have rubber bottoms. Are comfortable and fit you well. Are closed at the toe. Do not wear sandals. If you use a stepladder: Make sure that it is fully opened. Do not climb a closed stepladder. Make sure that both sides of the stepladder are locked into place. Ask someone to hold it for you, if possible. Clearly mark and make sure that you can see: Any grab bars or handrails. First and last steps. Where the edge of each step is. Use tools that help you move around (mobility aids) if they are needed. These include: Canes. Walkers. Scooters. Crutches. Turn on the lights when you go into a dark area. Replace any light bulbs as soon as they burn  out. Set up your furniture so you have a clear path. Avoid moving your furniture around. If any of your floors are uneven, fix them. If there are any pets around you, be aware of where they are. Review your medicines with your doctor. Some medicines can make you feel dizzy. This can increase your chance of falling. Ask your doctor what other things that you can do to help prevent falls. This information is not intended to replace advice given to you by your health care provider. Make sure you discuss any questions you have with your health care provider. Document Released: 03/16/2009 Document Revised: 10/26/2015 Document Reviewed: 06/24/2014 Elsevier Interactive Patient Education  2017 [employer].

## 2020-12-01 ENCOUNTER — Telehealth: Payer: Self-pay | Admitting: Nurse Practitioner

## 2020-12-01 NOTE — Telephone Encounter (Signed)
   Telephone encounter was:  Unsuccessful.  12/01/2020 Name: WESS BANEY MRN: [id number] DOB: [birth year]  Unsuccessful outbound call made today to assist with:  Food Insecurity and Financial Difficulties related to utilities  Outreach Attempt:  1st Attempt  A HIPAA compliant voice message was left requesting a return call.  Instructed patient to call back at [phone].  [place], Care Management Phone: [phone] Email: [email or web address]

## 2020-12-05 ENCOUNTER — Telehealth: Payer: Self-pay | Admitting: Nurse Practitioner

## 2020-12-05 NOTE — Telephone Encounter (Signed)
   Telephone encounter was:  Successful.  12/05/2020 Name: [name] MRN: [id number] DOB: 04-01-1981  Steve [name] is a 40 y.o. year old male who is a primary care patient of Jon Billings, NP . The community resource team was consulted for assistance with Food Insecurity and Financial Difficulties related to utility  bills  Care guide performed the following interventions: Patient provided with information about care guide support team and interviewed to confirm resource needs Discussed resources to assist with food resources, disability help, applying for food stamps and applied for a gift card $150 through [facility]. Sent email to pt to get him to send me a copy of his utility bill. I also sent an email with local food banks, information to the [place] to help file for more disability and DSS to get them to help with food stamps because he is in their system for Medicaid and so it will be a matter of pushing a button through DSS, also the link for income based housing.  Placed referral to [facility] via sharepoint .  Follow Up Plan:  Client will send CG the copy of the bill to submit to [facility] for possible payment.  [place], Care Management Phone: [phone] Email: [email or web address]

## 2020-12-07 ENCOUNTER — Telehealth: Payer: Self-pay | Admitting: Nurse Practitioner

## 2020-12-07 NOTE — Telephone Encounter (Signed)
   Telephone encounter was:  Successful.  12/07/2020 Name: WILLLIAM PETTET MRN: [id number] DOB: Oct 22, 1980  Bertram Savin Carew is a 40 y.o. year old male who is a primary care patient of Jon Billings, NP . The community resource team was consulted for assistance with Financial Difficulties related to utility bill  Care guide performed the following interventions: Follow up call placed to the patient to discuss status of referral Pt states that he will get me the utility bill by eob today. .  Follow Up Plan:  Client will get me the utility bill so that I may submit a request for [facility] to pay  [place], Care Management Phone: [phone] Email: [email or web address]

## 2020-12-22 ENCOUNTER — Ambulatory Visit (INDEPENDENT_AMBULATORY_CARE_PROVIDER_SITE_OTHER): Payer: Medicare Other | Admitting: Licensed Clinical Social Worker

## 2020-12-22 DIAGNOSIS — E1165 Type 2 diabetes mellitus with hyperglycemia: Secondary | ICD-10-CM | POA: Diagnosis not present

## 2020-12-22 DIAGNOSIS — E1159 Type 2 diabetes mellitus with other circulatory complications: Secondary | ICD-10-CM

## 2020-12-22 DIAGNOSIS — I152 Hypertension secondary to endocrine disorders: Secondary | ICD-10-CM | POA: Diagnosis not present

## 2020-12-22 DIAGNOSIS — F419 Anxiety disorder, unspecified: Secondary | ICD-10-CM

## 2020-12-25 NOTE — Chronic Care Management (AMB) (Signed)
Chronic Care Management    Clinical Social Work Note  12/25/2020 Name: Steve [name] MRN: [id number] DOB: [birth year]  Steve [name] is a 40 y.o. year old male who is a primary care patient of Jon Billings, NP. The CCM team was consulted to assist the patient with chronic disease management and/or care coordination needs related to: Mental Health Counseling and Resources.   Engaged with patient by telephone for follow up visit in response to provider referral for social work chronic care management and care coordination services.   Consent to Services:  The patient was given information about Chronic Care Management services, agreed to services, and gave verbal consent prior to initiation of services.  Please see initial visit note for detailed documentation.   Patient agreed to services and consent obtained.   Assessment: Patient is engaged in conversation, continues to maintain positive progress with care plan goals.. See Care Plan below for interventions and patient self-care actives.  Recommendation: Patient may benefit from, and is in agreement to work with LCSW to address care coordination needs and will continue to work with the clinical team to address health care and disease management related needs.  Follow up Plan: 02/12/21     SDOH (Social Determinants of Health) assessments and interventions performed:    Advanced Directives Status: Not addressed in this encounter.  CCM Care Plan  No Known Allergies  Outpatient Encounter Medications as of 12/22/2020  Medication Sig   dapagliflozin propanediol (FARXIGA) 10 MG TABS tablet Take 1 tablet (10 mg total) by mouth daily before breakfast.   dexlansoprazole (DEXILANT) 60 MG capsule Take 1 capsule (60 mg total) by mouth daily.   Dulaglutide (TRULICITY) 1.5 0000000 SOPN Inject 1.5 mg into the skin once a week.   No facility-administered encounter medications on file as of 12/22/2020.    Patient Active Problem List    Diagnosis Date Noted   Hyperlipidemia associated with type 2 diabetes mellitus ([place]) 09/13/2020   Diarrhea    Loss of weight    Polyp of duodenum    Polyp of descending colon    Gastroesophageal reflux disease 11/17/2018   Hypertension associated with diabetes ([place]) 02/10/2017   Type 2 diabetes mellitus with hyperglycemia, without long-term current use of insulin ([place]) 11/16/2016   Tobacco use 11/14/2016    Conditions to be addressed/monitored: Anxiety; Mental Health Concerns   Care Plan : Depression (Adult)  Updates made by Christa See D, LCSW since 12/25/2020 12:00 AM     Problem: Depression Identification (Depression)      Long-Range Goal: Depressive Symptoms Identified   Start Date: 09/08/2020  This Visit's Progress: On track  Priority: Medium  Note:   Timeframe:  Long-Range Goal Priority:  Medium Start Date:   09/08/20                       Expected End Date:  03/23/21/22                    Follow Up Date- 02/12/21 Current Barriers:  Acute Mental Health needs related to Stress and Anxiety Financial constraints related to managing health care expenses Limited social support ADL IADL limitations Mental Health Concerns  Social Isolation Lacks knowledge of community resource: available mental health resources that accept Medicare/Medicaid Suicidal Ideation/Homicidal Ideation: No  Clinical Social Work Goal(s):  Over the next 120 days, patient will work with SW to address concerns related to lack of support/resource connection. LCSW will assist patient in gaining  additional support/resource connection and community resource education in order to maintain health and mental health appropriately  Over the next 120 days, patient will demonstrate improved adherence to self care as evidenced by implementing healthy self-care into his daily routine such as: attending all medical appointments, deep breathing exercises, taking time for self-reflection, taking medications as prescribed,  drinking water and daily exercise to improve mobility.  Over the next 120 days, patient will demonstrate improved health management independence as evidenced by implementing healthy self-care skills and positive support/resources into his daily routine to help cope with stressors and improve overall health and well-being   Interventions: Patient interviewed and appropriate assessments performed Patient reports "everything is okay so far" Patient reports compliance with medication management. States he needs refills on diabetes and nausea medications  Patient is not interested in medication management or therapy to assist with symptom management Patient reports having a strong support system Patient denies SI/HI Patient has identified that he wants to better manage diabetes better. States that he has been working on changing eating habits.  CCM LCSW provided validation and encouragement Provided patient with information about coping skills to implement into his daily routine to combat anxiety and panic. SW used active and reflective listening, validated patient's feelings/concerns, and provided emotional support throughout entire session  LCSW discussed coping skills for anxiety/stress. SW used empathetic and active and reflective listening, validated patient's feelings/concerns, and provided emotional support. LCSW provided self-care education to help manage his multiple health conditions and improve his mood Discussed plans with patient for ongoing care management follow up and provided patient with direct contact information for care management team Collaboration with PCP regarding development and update of comprehensive plan of care as evidenced by provider attestation and co-signature Inter-disciplinary care team collaboration (see longitudinal plan of care) Patient Self Care Activities:  Continue to utilize healthy coping skills Attend all scheduled provider appointments Contact clinic with  questions or concerns          Christa See, MSW, [email or web address] Phone [phone] 12:06 PM

## 2020-12-25 NOTE — Patient Instructions (Signed)
Visit Information   Goals Addressed               This Visit's Progress     Patient Stated     SW: "I would like to work on my anxiety so that I can quit smoking cigarettes." (pt-stated)   On track     Patient Self Care Activities:  Continue to utilize healthy coping skills Attend all scheduled provider appointments Contact clinic with questions or concerns      Other     Track and Manage My Symptoms   On track     Timeframe:  Long-Range Goal Priority:  Medium Start Date:   09/08/20                       Expected End Date:  04/02/21                    Follow Up Date- 02/12/21  Patient Self Care Activities:  Continue to utilize healthy coping skills Attend all scheduled provider appointments Contact clinic with questions or concerns        Patient verbalizes understanding of instructions provided today and agrees to view in [place].   Telephone follow up appointment with care management team member scheduled for:02/12/21  Christa See, MSW, [email or web address] Phone [phone] 12:08 PM

## 2021-01-10 ENCOUNTER — Ambulatory Visit: Payer: Medicare Other | Admitting: Nurse Practitioner

## 2021-01-10 NOTE — Progress Notes (Deleted)
   There were no vitals taken for this visit.   Subjective:    Patient ID: Steve [name], male    DOB: [birth year], 40 y.o.   MRN: [id number]  HPI: Steve [name] is a 40 y.o. male  No chief complaint on file.  HYPERTENSION / HYPERLIPIDEMIA Satisfied with current treatment? {Blank single:19197::"yes","no"} Duration of hypertension: {Blank single:19197::"chronic","months","years"} BP monitoring frequency: {Blank single:19197::"not checking","rarely","daily","weekly","monthly","a few times a day","a few times a week","a few times a month"} BP range:  BP medication side effects: {Blank single:19197::"yes","no"} Past BP meds: {Blank A999333 (bystolic)","carvedilol","chlorthalidone","clonidine","diltiazem","exforge HCT","HCTZ","irbesartan (avapro)","labetalol","lisinopril","lisinopril-HCTZ","losartan (cozaar)","methyldopa","nifedipine","olmesartan (benicar)","olmesartan-HCTZ","quinapril","ramipril","spironalactone","tekturna","valsartan","valsartan-HCTZ","verapamil"} Duration of hyperlipidemia: {Blank single:19197::"chronic","months","years"} Cholesterol medication side effects: {Blank single:19197::"yes","no"} Cholesterol supplements: {Blank multiple:19196::"none","fish oil","niacin","red yeast rice"} Past cholesterol medications: {Blank multiple:19196::"none","atorvastain (lipitor)","lovastatin (mevacor)","pravastatin (pravachol)","rosuvastatin (crestor)","simvastatin (zocor)","vytorin","fenofibrate (tricor)","gemfibrozil","ezetimide (zetia)","niaspan","lovaza"} Medication compliance: {Blank single:19197::"excellent compliance","good compliance","fair compliance","poor compliance"} Aspirin: {Blank single:19197::"yes","no"} Recent stressors: {Blank single:19197::"yes","no"} Recurrent headaches: {Blank single:19197::"yes","no"} Visual changes: {Blank  single:19197::"yes","no"} Palpitations: {Blank single:19197::"yes","no"} Dyspnea: {Blank single:19197::"yes","no"} Chest pain: {Blank single:19197::"yes","no"} Lower extremity edema: {Blank single:19197::"yes","no"} Dizzy/lightheaded: {Blank single:19197::"yes","no"}  DIABETES Hypoglycemic episodes:{Blank single:19197::"yes","no"} Polydipsia/polyuria: {Blank single:19197::"yes","no"} Visual disturbance: {Blank single:19197::"yes","no"} Chest pain: {Blank single:19197::"yes","no"} Paresthesias: {Blank single:19197::"yes","no"} Glucose Monitoring: {Blank single:19197::"yes","no"}  Accucheck frequency: {Blank single:19197::"Not Checking","Daily","BID","TID"}  Fasting glucose:  Post prandial:  Evening:  Before meals: Taking Insulin?: {Blank single:19197::"yes","no"}  Long acting insulin:  Short acting insulin: Blood Pressure Monitoring: {Blank single:19197::"not checking","rarely","daily","weekly","monthly","a few times a day","a few times a week","a few times a month"} Retinal Examination: {Blank single:19197::"Up to Date","Not up to Date"} Foot Exam: {Blank single:19197::"Up to Date","Not up to Date"} Diabetic Education: {Blank single:19197::"Completed","Not Completed"} Pneumovax: {Blank single:19197::"Up to Date","Not up to Date","unknown"} Influenza: {Blank single:19197::"Up to Date","Not up to Date","unknown"} Aspirin: {Blank single:19197::"yes","no"}  Relevant past medical, surgical, family and social history reviewed and updated as indicated. Interim medical history since our last visit reviewed. Allergies and medications reviewed and updated.  Review of Systems  Per HPI unless specifically indicated above     Objective:    There were no vitals taken for this visit.  Wt Readings from Last 3 Encounters:  11/27/20 245 lb (111.1 kg)  11/09/20 244 lb (110.7 kg)  09/12/20 252 lb (114.3 kg)    Physical Exam  Results for orders placed or performed in visit on 11/09/20   Chlamydia/Gonococcus/Trichomonas, NAA([facility])   Specimen: Urine   UR  Result Value Ref Range   Chlamydia by NAA Negative Negative   Gonococcus by NAA Negative Negative   Trich vag by NAA Negative Negative      Assessment & Plan:   Problem List Items Addressed This Visit       Cardiovascular and Mediastinum   Hypertension associated with diabetes ([place]) - Primary     Endocrine   Type 2 diabetes mellitus with hyperglycemia, without long-term current use of insulin ([place])   Hyperlipidemia associated with type 2 diabetes mellitus ([place])     Follow up plan: No follow-ups on file.

## 2021-01-26 ENCOUNTER — Other Ambulatory Visit: Payer: Self-pay | Admitting: Nurse Practitioner

## 2021-01-26 MED ORDER — DEXLANSOPRAZOLE 60 MG PO CPDR: 60.0000 mg | DELAYED_RELEASE_CAPSULE | Freq: Every day | ORAL | 1 refills | Status: DC

## 2021-01-26 NOTE — Telephone Encounter (Signed)
Medication Refill - Medication: Generic Dexilant  Has the patient contacted their pharmacy? Yes.  Pt said the pharmacy told him they sent in a request (Agent: If no, request that the patient contact the pharmacy for the refill.) (Agent: If yes, when and what did the pharmacy advise?)  Preferred Pharmacy (with phone number or street name): [facility]  Agent: Please be advised that RX refills may take up to 3 business days. We ask that you follow-up with your pharmacy.

## 2021-02-12 ENCOUNTER — Ambulatory Visit (INDEPENDENT_AMBULATORY_CARE_PROVIDER_SITE_OTHER): Payer: Medicare Other | Admitting: Licensed Clinical Social Worker

## 2021-02-12 DIAGNOSIS — E1159 Type 2 diabetes mellitus with other circulatory complications: Secondary | ICD-10-CM

## 2021-02-12 DIAGNOSIS — E1165 Type 2 diabetes mellitus with hyperglycemia: Secondary | ICD-10-CM

## 2021-02-12 DIAGNOSIS — F419 Anxiety disorder, unspecified: Secondary | ICD-10-CM

## 2021-02-12 NOTE — Patient Instructions (Signed)
Visit Information   Goals Addressed               This Visit's Progress     Patient Stated     SW: "I would like to work on my anxiety so that I can quit smoking cigarettes." (pt-stated)   On track     Patient Self Care Activities:  Continue to utilize healthy coping skills Attend all scheduled provider appointments Contact clinic with questions or concerns      Other     Track and Manage My Symptoms   On track     Timeframe:  Long-Range Goal Priority:  Medium Start Date:   09/08/20                       Expected End Date:  04/02/21                    Follow Up Date- 03/30/21  Patient Self Care Activities:  Continue to utilize healthy coping skills Attend all scheduled provider appointments Contact clinic with questions or concerns        Patient verbalizes understanding of instructions provided today and agrees to view in [place].   Telephone follow up appointment with care management team member scheduled for:03/30/21  Christa See, MSW, [email or web address] Phone [phone] 4:14 PM

## 2021-02-12 NOTE — Chronic Care Management (AMB) (Signed)
Chronic Care Management    Clinical Social Work Note  02/12/2021 Name: RORKE PAGLIARULO MRN: [id number] DOB: 18-May-1981  Bertram Savin Hrabak is a 40 y.o. year old male who is a primary care patient of Jon Billings, NP. The CCM team was consulted to assist the patient with chronic disease management and/or care coordination needs related to: Mental Health Counseling and Resources.   Engaged with patient by telephone for follow up visit in response to provider referral for social work chronic care management and care coordination services.   Consent to Services:  The patient was given information about Chronic Care Management services, agreed to services, and gave verbal consent prior to initiation of services.  Please see initial visit note for detailed documentation.   Patient agreed to services and consent obtained.   Consent to Services:  The patient was given information about Care Management services, agreed to services, and gave verbal consent prior to initiation of services.  Please see initial visit note for detailed documentation.   Patient agreed to services today and consent obtained.   Assessment: Engaged with patient by phone in response to provider referral for social work care coordination services: [place] and Resources.    Patient continues to maintain positive progress with care plan goals. Patient denies any current anxiety, stress, or depression symptoms. States he needs a refill on dexlansoprazole (DEXILANT) 60 MG capsule. See Care Plan below for interventions and patient self-care activities.  Recommendation: Patient may benefit from, and is in agreement work with LCSW to address care coordination needs and will continue to work with the clinical team to address health care and disease management related needs.   Follow up Plan: Patient would like continued follow-up from CCM LCSW .  per patient's request will follow up in 03/30/21.  Will call  office if needed prior to next encounter.   SDOH (Social Determinants of Health) assessments and interventions performed:  NA  Advanced Directives Status: Not addressed in this encounter.  CCM Care Plan  No Known Allergies  Outpatient Encounter Medications as of 02/12/2021  Medication Sig   dapagliflozin propanediol (FARXIGA) 10 MG TABS tablet Take 1 tablet (10 mg total) by mouth daily before breakfast.   dexlansoprazole (DEXILANT) 60 MG capsule Take 1 capsule (60 mg total) by mouth daily.   Dulaglutide (TRULICITY) 1.5 0000000 SOPN Inject 1.5 mg into the skin once a week.   No facility-administered encounter medications on file as of 02/12/2021.    Patient Active Problem List   Diagnosis Date Noted   Hyperlipidemia associated with type 2 diabetes mellitus ([place]) 09/13/2020   Diarrhea    Loss of weight    Polyp of duodenum    Polyp of descending colon    Gastroesophageal reflux disease 11/17/2018   Hypertension associated with diabetes ([place]) 02/10/2017   Type 2 diabetes mellitus with hyperglycemia, without long-term current use of insulin ([place]) 11/16/2016   Tobacco use 11/14/2016    Conditions to be addressed/monitored: Anxiety; Mental Health Concerns   Care Plan : Depression (Adult)  Updates made by Christa See D, LCSW since 02/12/2021 12:00 AM     Problem: Depression Identification (Depression)      Long-Range Goal: Depressive Symptoms Identified   Start Date: 09/08/2020  This Visit's Progress: On track  Recent Progress: On track  Priority: Medium  Note:   Timeframe:  Long-Range Goal Priority:  Medium Start Date:   09/08/20  Expected End Date:  04/02/21                    Follow Up Date- 03/30/21 Current Barriers:  Acute Mental Health needs related to Stress and Anxiety Financial constraints related to managing health care expenses Limited social support ADL IADL limitations Mental Health Concerns  Social Isolation Lacks knowledge of  community resource: available mental health resources that accept Medicare/Medicaid Suicidal Ideation/Homicidal Ideation: No Clinical Social Work Goal(s):  Over the next 120 days, patient will work with SW to address concerns related to lack of support/resource connection. LCSW will assist patient in gaining additional support/resource connection and community resource education in order to maintain health and mental health appropriately  Over the next 120 days, patient will demonstrate improved adherence to self care as evidenced by implementing healthy self-care into his daily routine such as: attending all medical appointments, deep breathing exercises, taking time for self-reflection, taking medications as prescribed, drinking water and daily exercise to improve mobility.  Over the next 120 days, patient will demonstrate improved health management independence as evidenced by implementing healthy self-care skills and positive support/resources into his daily routine to help cope with stressors and improve overall health and well-being  Interventions: Patient interviewed and appropriate assessments performed Patient reports he is "doing okay" States he is trying to change habits to promote health and well-being  Patient reports compliance with medication management. States he needs a refill on dexlansoprazole (DEXILANT) 60 MG capsule  Patient is not interested in medication management or therapy to assist with symptom management Patient reports having a strong support system Patient denies SI/HI Patient has identified that he wants to better manage diabetes better. States that he has been working on changing eating habits.  Provided patient with information about coping skills to implement into his daily routine to combat anxiety and panic 09/12: Patient denies symptoms of depression, anxiety, or stress SW used active and reflective listening, validated patient's feelings/concerns, and provided  emotional support throughout entire session  LCSW discussed coping skills for anxiety/stress. SW used empathetic and active and reflective listening, validated patient's feelings/concerns, and provided emotional support. LCSW provided self-care education to help manage his multiple health conditions and improve his mood Discussed plans with patient for ongoing care management follow up and provided patient with direct contact information for care management team Collaboration with PCP regarding development and update of comprehensive plan of care as evidenced by provider attestation and co-signature Inter-disciplinary care team collaboration (see longitudinal plan of care) Patient Self Care Activities:  Continue to utilize healthy coping skills Attend all scheduled provider appointments Contact clinic with questions or concerns          Christa See, MSW, [email or web address] Phone [phone] 4:09 PM

## 2021-03-02 DIAGNOSIS — E1159 Type 2 diabetes mellitus with other circulatory complications: Secondary | ICD-10-CM | POA: Diagnosis not present

## 2021-03-02 DIAGNOSIS — I152 Hypertension secondary to endocrine disorders: Secondary | ICD-10-CM

## 2021-03-02 DIAGNOSIS — E1165 Type 2 diabetes mellitus with hyperglycemia: Secondary | ICD-10-CM | POA: Diagnosis not present

## 2021-03-13 NOTE — Progress Notes (Deleted)
There were no vitals taken for this visit.   Subjective:    Patient ID: Steve [name], male    DOB: [birth year], 40 y.o.   MRN: [id number]  HPI: Steve [name] is a 40 y.o. male presenting on 03/14/2021 for comprehensive medical examination. Current medical complaints include:{Blank single:19197::"none","***"}  He currently lives with: Interim Problems from his last visit: {Blank single:19197::"yes","no"}  HYPERTENSION / HYPERLIPIDEMIA Satisfied with current treatment? {Blank single:19197::"yes","no"} Duration of hypertension: {Blank single:19197::"chronic","months","years"} BP monitoring frequency: {Blank single:19197::"not checking","rarely","daily","weekly","monthly","a few times a day","a few times a week","a few times a month"} BP range:  BP medication side effects: {Blank single:19197::"yes","no"} Past BP meds: {Blank XBLTJQZE:09233::"AQTM","AUQJFHLKTG","YBWLSLHTDS/KAJGOTLXBW","IOMBTDHR","CBULAGTXMI","WOEHOZYYQM/GNOI","BBCWUGQBVQ (bystolic)","carvedilol","chlorthalidone","clonidine","diltiazem","exforge HCT","HCTZ","irbesartan (avapro)","labetalol","lisinopril","lisinopril-HCTZ","losartan (cozaar)","methyldopa","nifedipine","olmesartan (benicar)","olmesartan-HCTZ","quinapril","ramipril","spironalactone","tekturna","valsartan","valsartan-HCTZ","verapamil"} Duration of hyperlipidemia: {Blank single:19197::"chronic","months","years"} Cholesterol medication side effects: {Blank single:19197::"yes","no"} Cholesterol supplements: {Blank multiple:19196::"none","fish oil","niacin","red yeast rice"} Past cholesterol medications: {Blank multiple:19196::"none","atorvastain (lipitor)","lovastatin (mevacor)","pravastatin (pravachol)","rosuvastatin (crestor)","simvastatin (zocor)","vytorin","fenofibrate (tricor)","gemfibrozil","ezetimide (zetia)","niaspan","lovaza"} Medication compliance: {Blank single:19197::"excellent compliance","good compliance","fair compliance","poor  compliance"} Aspirin: {Blank single:19197::"yes","no"} Recent stressors: {Blank single:19197::"yes","no"} Recurrent headaches: {Blank single:19197::"yes","no"} Visual changes: {Blank single:19197::"yes","no"} Palpitations: {Blank single:19197::"yes","no"} Dyspnea: {Blank single:19197::"yes","no"} Chest pain: {Blank single:19197::"yes","no"} Lower extremity edema: {Blank single:19197::"yes","no"} Dizzy/lightheaded: {Blank single:19197::"yes","no"}  DIABETES Hypoglycemic episodes:{Blank single:19197::"yes","no"} Polydipsia/polyuria: {Blank single:19197::"yes","no"} Visual disturbance: {Blank single:19197::"yes","no"} Chest pain: {Blank single:19197::"yes","no"} Paresthesias: {Blank single:19197::"yes","no"} Glucose Monitoring: {Blank single:19197::"yes","no"}  Accucheck frequency: {Blank single:19197::"Not Checking","Daily","BID","TID"}  Fasting glucose:  Post prandial:  Evening:  Before meals: Taking Insulin?: {Blank single:19197::"yes","no"}  Long acting insulin:  Short acting insulin: Blood Pressure Monitoring: {Blank single:19197::"not checking","rarely","daily","weekly","monthly","a few times a day","a few times a week","a few times a month"} Retinal Examination: {Blank single:19197::"Up to Date","Not up to Date"} Foot Exam: {Blank single:19197::"Up to Date","Not up to Date"} Diabetic Education: {Blank single:19197::"Completed","Not Completed"} Pneumovax: {Blank single:19197::"Up to Date","Not up to Date","unknown"} Influenza: {Blank single:19197::"Up to Date","Not up to Date","unknown"} Aspirin: {Blank single:19197::"yes","no"}  Depression Screen done today and results listed below:  Depression screen [facility] 2/9 11/27/2020 11/09/2020 11/03/2019 09/09/2019 02/17/2019  Decreased Interest 0 0 0 0 1  Down, Depressed, Hopeless 0 1 0 0 0  PHQ - 2 Score 0 1 0 0 1  Altered sleeping - 0 - - 0  Tired, decreased energy - 0 - - 2  Change in appetite - 3 - - 1  Feeling bad or failure about  yourself  - 1 - - 0  Trouble concentrating - 1 - - 0  Moving slowly or fidgety/restless - 0 - - 0  Suicidal thoughts - 0 - - 0  PHQ-9 Score - 6 - - 4  Difficult doing work/chores - Not difficult at all - - -    The patient {has/does not XIHW:38882} a history of falls. I {did/did not:19850} complete a risk assessment for falls. A plan of care for falls {was/was not:19852} documented.   Past Medical History:  Past Medical History:  Diagnosis Date   Diabetes mellitus without complication (HCC)    GERD (gastroesophageal reflux disease)    History of gastritis 09/2016   Hypertension     Surgical History:  Past Surgical History:  Procedure Laterality Date   COLONOSCOPY WITH PROPOFOL N/A 11/15/2019   Procedure: COLONOSCOPY WITH PROPOFOL WITH POLYPECTOMY, BIOPSIES;  Surgeon: Lucilla Lame, MD;  Location: [facility];  Service: Endoscopy;  Laterality: N/A;   ESOPHAGOGASTRODUODENOSCOPY (EGD) WITH PROPOFOL N/A 11/15/2019   Procedure: ESOPHAGOGASTRODUODENOSCOPY (EGD) WITH PROPOFOL WITH POLYPECTOMY, BIOPSIES;  Surgeon: Lucilla Lame, MD;  Location: [facility];  Service: Endoscopy;  Laterality: N/A;   TOOTH EXTRACTION      Medications:  Current Outpatient Medications on File Prior to Visit  Medication Sig   dapagliflozin propanediol (FARXIGA) 10 MG TABS tablet Take 1 tablet (10 mg total) by mouth daily before breakfast.   dexlansoprazole (DEXILANT) 60 MG capsule Take 1 capsule (60 mg total) by mouth daily.   Dulaglutide (TRULICITY) 1.5 UU/7.2ZD SOPN Inject 1.5 mg into the skin once a week.   No current facility-administered medications on file prior to visit.    Allergies:  No Known Allergies  Social History:  Social History   Socioeconomic History   Marital status: Single    Spouse name: Not on file   Number of children: Not on file   Years of education: Not on file   Highest education level: Not on file  Occupational History   Not on file  Tobacco Use   Smoking  status: Every Day    Packs/day: 0.50    Years: 23.00    Pack years: 11.50    Types: Cigarettes   Smokeless tobacco: Never   Tobacco comments:    since age 31  Vaping Use   Vaping Use: Never used  Substance and Sexual Activity   Alcohol use: No   Drug use: Yes    Types: Marijuana   Sexual activity: Not on file  Other Topics Concern   Not on file  Social History Narrative   Not on file   Social Determinants of Health   Financial Resource Strain: Medium Risk   Difficulty of Paying Living Expenses: Somewhat hard  Food Insecurity: Food Insecurity Present   Worried About Running Out of Food in the Last Year: Sometimes true   Ran Out of Food in the Last Year: Sometimes true  Transportation Needs: No Transportation Needs   Lack of Transportation (Medical): No   Lack of Transportation (Non-Medical): No  Physical Activity: Sufficiently Active   Days of Exercise per Week: 3 days   Minutes of Exercise per Session: 90 min  Stress: No Stress Concern Present   Feeling of Stress : Only a little  Social Connections: Not on file  Intimate Partner Violence: Not on file   Social History   Tobacco Use  Smoking Status Every Day   Packs/day: 0.50   Years: 23.00   Pack years: 11.50   Types: Cigarettes  Smokeless Tobacco Never  Tobacco Comments   since age 25   Social History   Substance and Sexual Activity  Alcohol Use No    Family History:  Family History  Problem Relation Age of Onset   Diabetes Brother    Hypertension Brother    Diabetes Maternal Aunt    Hypertension Maternal Aunt    Cancer Maternal Aunt        pancreas   Diabetes Maternal Uncle    Hypertension Maternal Uncle    Cancer Maternal Uncle        bone   Diabetes Maternal Grandmother    Cancer Maternal Grandmother    Diabetes Maternal Grandfather    Cancer Maternal Uncle        lung   COPD Neg Hx    Heart disease Neg Hx    Stroke Neg Hx     Past medical history, surgical history, medications,  allergies, family history and social history reviewed with patient today and changes made to appropriate areas of the chart.   ROS All other ROS negative except what is listed above and in the HPI.      Objective:    There were no vitals taken for this visit.  Wt Readings from Last  3 Encounters:  11/27/20 245 lb (111.1 kg)  11/09/20 244 lb (110.7 kg)  09/12/20 252 lb (114.3 kg)    Physical Exam  Results for orders placed or performed in visit on 11/09/20  Chlamydia/Gonococcus/Trichomonas, NAA([facility])   Specimen: Urine   UR  Result Value Ref Range   Chlamydia by NAA Negative Negative   Gonococcus by NAA Negative Negative   Trich vag by NAA Negative Negative      Assessment & Plan:   Problem List Items Addressed This Visit       Cardiovascular and Mediastinum   Hypertension associated with diabetes ([place])     Endocrine   Type 2 diabetes mellitus with hyperglycemia, without long-term current use of insulin ([place])   Hyperlipidemia associated with type 2 diabetes mellitus ([place])   Other Visit Diagnoses     Annual physical exam    -  Primary        Discussed aspirin prophylaxis for myocardial infarction prevention and decision was {Blank single:19197::"it was not indicated","made to continue ASA","made to start ASA","made to stop ASA","that we recommended ASA, and patient refused"}  LABORATORY TESTING:  Health maintenance labs ordered today as discussed above.   The natural history of prostate cancer and ongoing controversy regarding screening and potential treatment outcomes of prostate cancer has been discussed with the patient. The meaning of a false positive PSA and a false negative PSA has been discussed. He indicates understanding of the limitations of this screening test and wishes *** to proceed with screening PSA testing.   IMMUNIZATIONS:   - Tdap: Tetanus vaccination status reviewed: {tetanus status:315746}. - Influenza: {Blank single:19197::"Up to  date","Administered today","Postponed to flu season","Refused","Given elsewhere"} - Pneumovax: {Blank single:19197::"Up to date","Administered today","Not applicable","Refused","Given elsewhere"} - Prevnar: Not applicable - HPV: {Blank single:19197::"Up to date","Administered today","Not applicable","Refused","Given elsewhere"} - Zostavax vaccine: Not applicable  SCREENING: - Colonoscopy: Up to date  Discussed with patient purpose of the colonoscopy is to detect colon cancer at curable precancerous or early stages   - AAA Screening: Not applicable  -Hearing Test: Not applicable  -Spirometry: Not applicable   PATIENT COUNSELING:    Sexuality: Discussed sexually transmitted diseases, partner selection, use of condoms, avoidance of unintended pregnancy  and contraceptive alternatives.   Advised to avoid cigarette smoking.  I discussed with the patient that most people either abstain from alcohol or drink within safe limits (<=14/week and <=4 drinks/occasion for males, <=7/weeks and <= 3 drinks/occasion for females) and that the risk for alcohol disorders and other health effects rises proportionally with the number of drinks per week and how often a drinker exceeds daily limits.  Discussed cessation/primary prevention of drug use and availability of treatment for abuse.   Diet: Encouraged to adjust caloric intake to maintain  or achieve ideal body weight, to reduce intake of dietary saturated fat and total fat, to limit sodium intake by avoiding high sodium foods and not adding table salt, and to maintain adequate dietary potassium and calcium preferably from fresh fruits, vegetables, and low-fat dairy products.    stressed the importance of regular exercise  Injury prevention: Discussed safety belts, safety helmets, smoke detector, smoking near bedding or upholstery.   Dental health: Discussed importance of regular tooth brushing, flossing, and dental visits.   Follow up plan: NEXT  PREVENTATIVE PHYSICAL DUE IN 1 YEAR. No follow-ups on file.

## 2021-03-14 ENCOUNTER — Other Ambulatory Visit: Payer: Self-pay

## 2021-03-14 ENCOUNTER — Ambulatory Visit (INDEPENDENT_AMBULATORY_CARE_PROVIDER_SITE_OTHER): Payer: Medicare Other | Admitting: Nurse Practitioner

## 2021-03-14 ENCOUNTER — Encounter: Payer: Self-pay | Admitting: Nurse Practitioner

## 2021-03-14 VITALS — BP 127/84 | HR 76 | Ht 73.0 in | Wt 245.0 lb

## 2021-03-14 DIAGNOSIS — E1159 Type 2 diabetes mellitus with other circulatory complications: Secondary | ICD-10-CM | POA: Diagnosis not present

## 2021-03-14 DIAGNOSIS — E1169 Type 2 diabetes mellitus with other specified complication: Secondary | ICD-10-CM

## 2021-03-14 DIAGNOSIS — E1165 Type 2 diabetes mellitus with hyperglycemia: Secondary | ICD-10-CM

## 2021-03-14 DIAGNOSIS — I152 Hypertension secondary to endocrine disorders: Secondary | ICD-10-CM

## 2021-03-14 DIAGNOSIS — Z0000 Encounter for general adult medical examination without abnormal findings: Secondary | ICD-10-CM | POA: Diagnosis not present

## 2021-03-14 DIAGNOSIS — E785 Hyperlipidemia, unspecified: Secondary | ICD-10-CM

## 2021-03-14 DIAGNOSIS — L600 Ingrowing nail: Secondary | ICD-10-CM | POA: Diagnosis not present

## 2021-03-14 LAB — URINALYSIS, ROUTINE W REFLEX MICROSCOPIC
Bilirubin, UA: NEGATIVE
Ketones, UA: NEGATIVE
Leukocytes,UA: NEGATIVE
Nitrite, UA: NEGATIVE
Protein,UA: NEGATIVE
RBC, UA: NEGATIVE
Specific Gravity, UA: >1.03 — ABNORMAL HIGH (ref 1.005–1.030)
Urobilinogen, Ur: 1 mg/dL (ref 0.2–1.0)
pH, UA: 5.5 (ref 5.0–7.5)

## 2021-03-14 MED ORDER — DEXLANSOPRAZOLE 60 MG PO CPDR: 60.0000 mg | DELAYED_RELEASE_CAPSULE | Freq: Every day | ORAL | 1 refills | Status: DC

## 2021-03-14 MED ORDER — SULFAMETHOXAZOLE-TRIMETHOPRIM 800-160 MG PO TABS
1.0000 | ORAL_TABLET | Freq: Two times a day (BID) | ORAL | 0 refills | Status: DC
Start: 2021-03-14 — End: 2021-03-21

## 2021-03-14 NOTE — Assessment & Plan Note (Signed)
Chronic.  Advised patient to cook at home more.  Decrease amount of fast food he is eating.  Labs ordered today.  Will make further recommendations based on lab results.  Declines Statin in the setting of Type 2 DM. 

## 2021-03-14 NOTE — Progress Notes (Signed)
Hi Steve [name]  It was good to see you today. Your urine from today looks good.  I will send you another message once the rest of your lab work comes back.

## 2021-03-14 NOTE — Assessment & Plan Note (Deleted)
Chronic.  Ongoing.  Patient is not checking blood sugars. Increased Farxiga to 10mg  to maximize effects.  Will follow up in 6 months. Will increase Trulicity to 3mg  if A1c is not well controlled.  Call sooner if concerns arise.

## 2021-03-14 NOTE — Progress Notes (Signed)
BP 127/84   Pulse 76   Ht 6\' 1"  (1.854 m)   Wt 245 lb (111.1 kg)   BMI 32.32 kg/m    Subjective:    Patient ID: Steve [name], male    DOB: 16-Apr-1981, 40 y.o.   MRN: [id number]  HPI: Steve [name] is a 40 y.o. male  Chief Complaint  Patient presents with   Annual Exam    Left thumb pain   HYPERTENSION / HYPERLIPIDEMIA Satisfied with current treatment? no Duration of hypertension: years BP monitoring frequency: not checking BP range:  BP medication side effects: no Past BP meds: none Duration of hyperlipidemia: years Cholesterol medication side effects: no Cholesterol supplements: none Past cholesterol medications: none Medication compliance: excellent compliance Aspirin: no Recent stressors: no Recurrent headaches: no Visual changes: no Palpitations: no Dyspnea: no Chest pain: no Lower extremity edema: no Dizzy/lightheaded: no  DIABETES Hypoglycemic episodes:no Polydipsia/polyuria: no Visual disturbance: no Chest pain: no Paresthesias: no Glucose Monitoring: no  Accucheck frequency: Not Checking  Fasting glucose:  Post prandial:  Evening:  Before meals: Taking Insulin?: no  Long acting insulin:  Short acting insulin: Blood Pressure Monitoring: not checking Retinal Examination: Not up to Date Foot Exam: Up to Date Diabetic Education: Not Completed Pneumovax: Not up to Date Influenza: Not up to Date Aspirin: no  LEFT THUMB PAIN Patient presents to clinic with complaints of left thumb pain.  States it has been hurting for about a week.  Did not injure his thumb.  Relevant past medical, surgical, family and social history reviewed and updated as indicated. Interim medical history since our last visit reviewed. Allergies and medications reviewed and updated.  Review of Systems  Eyes:  Negative for visual disturbance.  Respiratory:  Negative for chest tightness and shortness of breath.   Cardiovascular:  Negative for chest pain,  palpitations and leg swelling.  Endocrine: Negative for polydipsia and polyuria.  Musculoskeletal:        Left thumb pain  Neurological:  Negative for dizziness, light-headedness, numbness and headaches.   Per HPI unless specifically indicated above     Objective:    BP 127/84   Pulse 76   Ht 6\' 1"  (1.854 m)   Wt 245 lb (111.1 kg)   BMI 32.32 kg/m   Wt Readings from Last 3 Encounters:  03/14/21 245 lb (111.1 kg)  11/27/20 245 lb (111.1 kg)  11/09/20 244 lb (110.7 kg)    Physical Exam Vitals and nursing note reviewed.  Constitutional:      General: He is not in acute distress.    Appearance: Normal appearance. He is not ill-appearing, toxic-appearing or diaphoretic.  HENT:     Head: Normocephalic.     Right Ear: External ear normal.     Left Ear: External ear normal.     Nose: Nose normal. No congestion or rhinorrhea.     Mouth/Throat:     Mouth: Mucous membranes are moist.  Eyes:     General:        Right eye: No discharge.        Left eye: No discharge.     Extraocular Movements: Extraocular movements intact.     Conjunctiva/sclera: Conjunctivae normal.     Pupils: Pupils are equal, round, and reactive to light.  Cardiovascular:     Rate and Rhythm: Normal rate and regular rhythm.     Heart sounds: No murmur heard. Pulmonary:     Effort: Pulmonary effort is normal. No respiratory distress.  Breath sounds: Normal breath sounds. No wheezing, rhonchi or rales.  Abdominal:     General: Abdomen is flat. Bowel sounds are normal.  Musculoskeletal:       Arms:     Cervical back: Normal range of motion and neck supple.  Skin:    General: Skin is warm and dry.     Capillary Refill: Capillary refill takes less than 2 seconds.  Neurological:     General: No focal deficit present.     Mental Status: He is alert and oriented to person, place, and time.  Psychiatric:        Mood and Affect: Mood normal.        Behavior: Behavior normal.        Thought Content:  Thought content normal.        Judgment: Judgment normal.    Results for orders placed or performed in visit on 11/09/20  Chlamydia/Gonococcus/Trichomonas, NAA([facility])   Specimen: Urine   UR  Result Value Ref Range   Chlamydia by NAA Negative Negative   Gonococcus by NAA Negative Negative   Trich vag by NAA Negative Negative      Assessment & Plan:   Problem List Items Addressed This Visit       Cardiovascular and Mediastinum   Hypertension associated with diabetes ([place]) - Primary    Chronic.  Under good control withoutmedication.  Continue to monitor. Call with any concerns.  Declines ACE for kidney protection in the setting of DM.      Relevant Orders   TSH   PSA   Lipid panel   Comprehensive metabolic panel   Urinalysis, Routine w reflex microscopic     Endocrine   Type 2 diabetes mellitus with hyperglycemia, without long-term current use of insulin (HCC)    Chronic.  Ongoing.  Patient is not checking blood sugars. Increased Farxiga to 10mg  to maximize effects.  Will follow up in 6 months. Will increase Trulicity to 3mg  if A1c is not well controlled.  Call sooner if concerns arise.       Relevant Orders   TSH   PSA   Lipid panel   Comprehensive metabolic panel   Urinalysis, Routine w reflex microscopic   Microalbumin, Urine Waived   Hyperlipidemia associated with type 2 diabetes mellitus (HCC)    Chronic.  Advised patient to cook at home more.  Decrease amount of fast food he is eating.  Labs ordered today.  Will make further recommendations based on lab results.  Declines Statin in the setting of Type 2 DM.      Relevant Orders   TSH   PSA   Lipid panel   Comprehensive metabolic panel   Urinalysis, Routine w reflex microscopic   Other Visit Diagnoses     Ingrowing nail with infection       Ingrown nail on left side of thumb. Will treat with bactrim.  Return to clinic if symptoms worsen or fail to improve.   Relevant Medications    sulfamethoxazole-trimethoprim (BACTRIM DS) 800-160 MG tablet        Follow up plan: Return in about 6 months (around 09/12/2021) for HTN, HLD, DM2 FU.

## 2021-03-14 NOTE — Assessment & Plan Note (Signed)
Chronic.  Under good control withoutmedication.  Continue to monitor. Call with any concerns.  Declines ACE for kidney protection in the setting of DM.

## 2021-03-14 NOTE — Assessment & Plan Note (Signed)
Chronic.  Ongoing.  Patient is not checking blood sugars. Increased Farxiga to 10mg  to maximize effects.  Will follow up in 6 months. Will increase Trulicity to 3mg  if A1c is not well controlled.  Call sooner if concerns arise.

## 2021-03-15 ENCOUNTER — Telehealth: Payer: Self-pay

## 2021-03-15 DIAGNOSIS — E1159 Type 2 diabetes mellitus with other circulatory complications: Secondary | ICD-10-CM

## 2021-03-15 DIAGNOSIS — E785 Hyperlipidemia, unspecified: Secondary | ICD-10-CM

## 2021-03-15 DIAGNOSIS — I152 Hypertension secondary to endocrine disorders: Secondary | ICD-10-CM

## 2021-03-15 DIAGNOSIS — E1169 Type 2 diabetes mellitus with other specified complication: Secondary | ICD-10-CM

## 2021-03-15 DIAGNOSIS — E1165 Type 2 diabetes mellitus with hyperglycemia: Secondary | ICD-10-CM

## 2021-03-15 LAB — LIPID PANEL
Chol/HDL Ratio: 8.3 ratio — ABNORMAL HIGH (ref 0.0–5.0)
Cholesterol, Total: 248 mg/dL — ABNORMAL HIGH (ref 100–199)
HDL: 30 mg/dL — ABNORMAL LOW (ref >39)
LDL Chol Calc (NIH): 167 mg/dL — ABNORMAL HIGH (ref 0–99)
Triglycerides: 270 mg/dL — ABNORMAL HIGH (ref 0–149)
VLDL Cholesterol Cal: 51 mg/dL — ABNORMAL HIGH (ref 5–40)

## 2021-03-15 LAB — COMPREHENSIVE METABOLIC PANEL
ALT: 30 IU/L (ref 0–44)
AST: 17 IU/L (ref 0–40)
Albumin/Globulin Ratio: 2 (ref 1.2–2.2)
Albumin: 4.5 g/dL (ref 4.0–5.0)
Alkaline Phosphatase: 80 IU/L (ref 44–121)
BUN/Creatinine Ratio: 8 — ABNORMAL LOW (ref 9–20)
BUN: 7 mg/dL (ref 6–24)
Bilirubin Total: 0.6 mg/dL (ref 0.0–1.2)
CO2: 25 mmol/L (ref 20–29)
Calcium: 9.6 mg/dL (ref 8.7–10.2)
Chloride: 98 mmol/L (ref 96–106)
Creatinine, Ser: 0.85 mg/dL (ref 0.76–1.27)
Globulin, Total: 2.3 g/dL (ref 1.5–4.5)
Glucose: 172 mg/dL — ABNORMAL HIGH (ref 70–99)
Potassium: 4.3 mmol/L (ref 3.5–5.2)
Sodium: 136 mmol/L (ref 134–144)
Total Protein: 6.8 g/dL (ref 6.0–8.5)
eGFR: 113 mL/min/{1.73_m2} (ref >59)

## 2021-03-15 LAB — TSH: TSH: 1.17 u[IU]/mL (ref 0.450–4.500)

## 2021-03-15 LAB — PSA: Prostate Specific Ag, Serum: 0.4 ng/mL (ref 0.0–4.0)

## 2021-03-15 MED ORDER — ROSUVASTATIN CALCIUM 20 MG PO TABS: 20.0000 mg | ORAL_TABLET | Freq: Every day | ORAL | 3 refills | Status: DC

## 2021-03-15 NOTE — Telephone Encounter (Signed)
Patient aware of results and recommendations.  Patient agreed to starting crestor, medication sent.  New order for A1C placed and appointment scheduled.

## 2021-03-15 NOTE — Telephone Encounter (Signed)
-----   Message from Jon Billings, NP sent at 03/15/2021  8:29 AM EDT ----- Please let patient know that his lab work shows that his thyroid, PSA, and liver, kidneys, and electrolytes look good. For some reason the A1c did not get drawn. Please schedule him a lab appointment to come back in and check his A1c.  His cholesterol remains uncontrolled.  I recommend he start Crestor 20mg . If he agrees I can send this to the pharmacy for him.  Please let me know if he has any questions.

## 2021-03-15 NOTE — Addendum Note (Signed)
Addended by: Jon Billings on: 03/15/2021 08:29 AM   Modules accepted: Orders

## 2021-03-15 NOTE — Progress Notes (Signed)
Please let patient know that his lab work shows that his thyroid, PSA, and liver, kidneys, and electrolytes look good. For some reason the A1c did not get drawn. Please schedule him a lab appointment to come back in and check his A1c.  His cholesterol remains uncontrolled.  I recommend he start Crestor 20mg . If he agrees I can send this to the pharmacy for him.  Please let me know if he has any questions.

## 2021-03-19 ENCOUNTER — Other Ambulatory Visit: Payer: Medicare Other

## 2021-03-21 ENCOUNTER — Other Ambulatory Visit: Payer: Self-pay

## 2021-03-21 ENCOUNTER — Emergency Department: Payer: Medicare Other

## 2021-03-21 ENCOUNTER — Emergency Department
Admission: EM | Admit: 2021-03-21 | Discharge: 2021-03-21 | Disposition: A | Discharge status: Home / Self Care | Payer: Medicare Other | Attending: Emergency Medicine | Admitting: Emergency Medicine

## 2021-03-21 DIAGNOSIS — K219 Gastro-esophageal reflux disease without esophagitis: Secondary | ICD-10-CM | POA: Diagnosis not present

## 2021-03-21 DIAGNOSIS — Z79899 Other long term (current) drug therapy: Secondary | ICD-10-CM | POA: Insufficient documentation

## 2021-03-21 DIAGNOSIS — R112 Nausea with vomiting, unspecified: Secondary | ICD-10-CM | POA: Diagnosis not present

## 2021-03-21 DIAGNOSIS — R1013 Epigastric pain: Secondary | ICD-10-CM | POA: Insufficient documentation

## 2021-03-21 DIAGNOSIS — F17210 Nicotine dependence, cigarettes, uncomplicated: Secondary | ICD-10-CM | POA: Diagnosis not present

## 2021-03-21 DIAGNOSIS — R2231 Localized swelling, mass and lump, right upper limb: Secondary | ICD-10-CM | POA: Diagnosis present

## 2021-03-21 DIAGNOSIS — E119 Type 2 diabetes mellitus without complications: Secondary | ICD-10-CM | POA: Diagnosis not present

## 2021-03-21 DIAGNOSIS — R1011 Right upper quadrant pain: Secondary | ICD-10-CM | POA: Insufficient documentation

## 2021-03-21 DIAGNOSIS — R7401 Elevation of levels of liver transaminase levels: Secondary | ICD-10-CM | POA: Insufficient documentation

## 2021-03-21 DIAGNOSIS — I10 Essential (primary) hypertension: Secondary | ICD-10-CM | POA: Insufficient documentation

## 2021-03-21 DIAGNOSIS — L03011 Cellulitis of right finger: Secondary | ICD-10-CM | POA: Diagnosis not present

## 2021-03-21 LAB — COMPREHENSIVE METABOLIC PANEL
ALT: 390 U/L — ABNORMAL HIGH (ref 0–44)
AST: 268 U/L — ABNORMAL HIGH (ref 15–41)
Albumin: 3.4 g/dL — ABNORMAL LOW (ref 3.5–5.0)
Alkaline Phosphatase: 260 U/L — ABNORMAL HIGH (ref 38–126)
Anion gap: 14 (ref 5–15)
BUN: 11 mg/dL (ref 6–20)
CO2: 22 mmol/L (ref 22–32)
Calcium: 9.4 mg/dL (ref 8.9–10.3)
Chloride: 94 mmol/L — ABNORMAL LOW (ref 98–111)
Creatinine, Ser: 0.82 mg/dL (ref 0.61–1.24)
GFR, Estimated: >60 mL/min (ref >60)
Glucose, Bld: 202 mg/dL — ABNORMAL HIGH (ref 70–99)
Potassium: 3.8 mmol/L (ref 3.5–5.1)
Sodium: 130 mmol/L — ABNORMAL LOW (ref 135–145)
Total Bilirubin: 5.4 mg/dL — ABNORMAL HIGH (ref 0.3–1.2)
Total Protein: 7.3 g/dL (ref 6.5–8.1)

## 2021-03-21 LAB — CBC WITH DIFFERENTIAL/PLATELET
Abs Immature Granulocytes: 0.02 10*3/uL (ref 0.00–0.07)
Basophils Absolute: 0 10*3/uL (ref 0.0–0.1)
Basophils Relative: 1 %
Eosinophils Absolute: 0 10*3/uL (ref 0.0–0.5)
Eosinophils Relative: 1 %
HCT: 41.8 % (ref 39.0–52.0)
Hemoglobin: 14.4 g/dL (ref 13.0–17.0)
Immature Granulocytes: 0 %
Lymphocytes Relative: 6 %
Lymphs Abs: 0.3 10*3/uL — ABNORMAL LOW (ref 0.7–4.0)
MCH: 28.6 pg (ref 26.0–34.0)
MCHC: 34.4 g/dL (ref 30.0–36.0)
MCV: 83.1 fL (ref 80.0–100.0)
Monocytes Absolute: 0.4 10*3/uL (ref 0.1–1.0)
Monocytes Relative: 8 %
Neutro Abs: 4 10*3/uL (ref 1.7–7.7)
Neutrophils Relative %: 84 %
Platelets: 126 10*3/uL — ABNORMAL LOW (ref 150–400)
RBC: 5.03 MIL/uL (ref 4.22–5.81)
RDW: 12.1 % (ref 11.5–15.5)
WBC: 4.8 10*3/uL (ref 4.0–10.5)
nRBC: 0 % (ref 0.0–0.2)

## 2021-03-21 LAB — URINALYSIS, COMPLETE (UACMP) WITH MICROSCOPIC
Bacteria, UA: NONE SEEN
Glucose, UA: >=500 mg/dL — AB
Hgb urine dipstick: NEGATIVE
Ketones, ur: 20 mg/dL — AB
Leukocytes,Ua: NEGATIVE
Nitrite: NEGATIVE
Protein, ur: NEGATIVE mg/dL
Specific Gravity, Urine: >1.046 — ABNORMAL HIGH (ref 1.005–1.030)
Squamous Epithelial / HPF: NONE SEEN (ref 0–5)
pH: 6 (ref 5.0–8.0)

## 2021-03-21 LAB — LIPASE, BLOOD: Lipase: 128 U/L — ABNORMAL HIGH (ref 11–51)

## 2021-03-21 LAB — HIV ANTIBODY (ROUTINE TESTING W REFLEX): HIV Screen 4th Generation wRfx: NONREACTIVE

## 2021-03-21 MED ORDER — ONDANSETRON HCL 4 MG/2ML IJ SOLN
4.0000 mg | Freq: Once | INTRAMUSCULAR | Status: AC
  Administered 2021-03-21: 4 mg via INTRAVENOUS
  Filled 2021-03-21: qty 2

## 2021-03-21 MED ORDER — SODIUM CHLORIDE 0.9 % IV BOLUS
1000.0000 mL | Freq: Once | INTRAVENOUS | Status: AC
  Administered 2021-03-21: 1000 mL via INTRAVENOUS

## 2021-03-21 MED ORDER — ONDANSETRON 4 MG PO TBDP: 4.0000 mg | ORAL_TABLET | Freq: Three times a day (TID) | ORAL | 0 refills | Status: DC | PRN

## 2021-03-21 MED ORDER — DOXYCYCLINE HYCLATE 100 MG PO CAPS: 100.0000 mg | ORAL_CAPSULE | Freq: Two times a day (BID) | ORAL | 0 refills | Status: AC

## 2021-03-21 MED ORDER — IOHEXOL 350 MG/ML SOLN
75.0000 mL | Freq: Once | INTRAVENOUS | Status: AC | PRN
  Administered 2021-03-21: 75 mL via INTRAVENOUS

## 2021-03-21 MED ORDER — BUPIVACAINE HCL (PF) 0.5 % IJ SOLN
10.0000 mL | Freq: Once | INTRAMUSCULAR | Status: AC
  Administered 2021-03-21: 10 mL
  Filled 2021-03-21: qty 30

## 2021-03-21 MED ORDER — SODIUM CHLORIDE 0.9 % IV SOLN
2.0000 g | Freq: Once | INTRAVENOUS | Status: AC
  Administered 2021-03-21: 2 g via INTRAVENOUS
  Filled 2021-03-21: qty 20

## 2021-03-21 MED ORDER — MORPHINE SULFATE (PF) 4 MG/ML IV SOLN
4.0000 mg | Freq: Once | INTRAVENOUS | Status: AC
Start: 2021-03-21 — End: 2021-03-21
  Administered 2021-03-21: 4 mg via INTRAVENOUS
  Filled 2021-03-21: qty 1

## 2021-03-21 MED ORDER — PANTOPRAZOLE SODIUM 40 MG PO TBEC: 40.0000 mg | DELAYED_RELEASE_TABLET | Freq: Every day | ORAL | 0 refills | Status: DC

## 2021-03-21 MED ORDER — MORPHINE SULFATE (PF) 4 MG/ML IV SOLN: 4.0000 mg | Freq: Once | INTRAVENOUS | Status: DC

## 2021-03-21 MED ORDER — ONDANSETRON HCL 4 MG/2ML IJ SOLN: 4.0000 mg | Freq: Once | INTRAMUSCULAR | Status: DC

## 2021-03-21 NOTE — ED Notes (Signed)
MD notified of bupivacaine at bedside for procedure.

## 2021-03-21 NOTE — ED Notes (Signed)
Pt off unit for CT/US

## 2021-03-21 NOTE — ED Notes (Signed)
Pt. Off unit for CT/US.

## 2021-03-21 NOTE — Discharge Instructions (Addendum)
For your finger:  Soak the finger in warm water at least 3-4x daily for the next several days  Take the antibiotic as prescribed  For your stomach:  Avoid fatty foods. Follow the pancreatitis eating plan provided.  Take Zofran for nausea as needed.  Take over-the-counter tylenol and/or ibuprofen for pain.  Take the Protonix to help with acid and indigestion.

## 2021-03-21 NOTE — ED Triage Notes (Signed)
Pt to ED for emesis since Friday after starting antibiotic for infection to right thumb nail.  Ambulatory, NAD noted Diabetic

## 2021-03-21 NOTE — ED Provider Notes (Signed)
[facility] Emergency Department Provider Note  ____________________________________________   Event Date/Time   First MD Initiated Contact with Patient 03/21/21 1017     (approximate)  I have reviewed the triage vital signs and the nursing notes.   HISTORY  Chief Complaint Emesis and infection    HPI Steve [name] is a 40 y.o. male with history of hypertension, gastritis, diabetes, here with multiple complaints.  The patient's primary complaint is abdominal pain, nausea, and vomiting.  The patient states that he recently was diagnosed with a finger infection and started on Bactrim.  Several days ago, he took his first dose of Bactrim and since then, has had nausea, vomiting, and inability to eat or drink.  He describes an associated aching, throbbing, epigastric abdominal pain.  He has had nausea and inability to tolerate food.  He states that every time he tries to eat, he vomits.  Denies any blood in his emesis.  Regarding his finger, he states this is mildly worsened as well because he has not been taking the antibiotic.  Denies any fevers or chills.  No history of previous finger infections.  No other acute complaints.  No recent sick contacts.    Past Medical History:  Diagnosis Date   Diabetes mellitus without complication (HCC)    GERD (gastroesophageal reflux disease)    History of gastritis 09/2016   Hypertension     Patient Active Problem List   Diagnosis Date Noted   Hyperlipidemia associated with type 2 diabetes mellitus ([place]) 09/13/2020   Diarrhea    Loss of weight    Polyp of duodenum    Polyp of descending Steve    Gastroesophageal reflux disease 11/17/2018   Hypertension associated with diabetes ([place]) 02/10/2017   Type 2 diabetes mellitus with hyperglycemia, without long-term current use of insulin ([place]) 11/16/2016   Tobacco use 11/14/2016    Past Surgical History:  Procedure Laterality Date   COLONOSCOPY WITH PROPOFOL N/A  11/15/2019   Procedure: COLONOSCOPY WITH PROPOFOL WITH POLYPECTOMY, BIOPSIES;  Surgeon: Lucilla Lame, MD;  Location: [place];  Service: Endoscopy;  Laterality: N/A;   ESOPHAGOGASTRODUODENOSCOPY (EGD) WITH PROPOFOL N/A 11/15/2019   Procedure: ESOPHAGOGASTRODUODENOSCOPY (EGD) WITH PROPOFOL WITH POLYPECTOMY, BIOPSIES;  Surgeon: Lucilla Lame, MD;  Location: [place];  Service: Endoscopy;  Laterality: N/A;   TOOTH EXTRACTION      Prior to Admission medications   Medication Sig Start Date End Date Taking? Authorizing Provider  doxycycline (VIBRAMYCIN) 100 MG capsule Take 1 capsule (100 mg total) by mouth 2 (two) times daily for 7 days. 03/21/21 03/28/21 Yes Duffy Bruce, MD  ondansetron (ZOFRAN ODT) 4 MG disintegrating tablet Take 1 tablet (4 mg total) by mouth every 8 (eight) hours as needed for nausea or vomiting. 03/21/21  Yes Duffy Bruce, MD  pantoprazole (PROTONIX) 40 MG tablet Take 1 tablet (40 mg total) by mouth daily for 14 days. 03/21/21 04/04/21 Yes Duffy Bruce, MD  dapagliflozin propanediol (FARXIGA) 10 MG TABS tablet Take 1 tablet (10 mg total) by mouth daily before breakfast. 11/09/20   Jon Billings, NP  Dulaglutide (TRULICITY) 1.5 RJ/1.8AC SOPN Inject 1.5 mg into the skin once a week. 09/14/20   Jon Billings, NP  rosuvastatin (CRESTOR) 20 MG tablet Take 1 tablet (20 mg total) by mouth daily. 03/15/21   Jon Billings, NP    Allergies Patient has no known allergies.  Family History  Problem Relation Age of Onset   Diabetes Brother    Hypertension Brother  Diabetes Maternal Aunt    Hypertension Maternal Aunt    Cancer Maternal Aunt        pancreas   Diabetes Maternal Uncle    Hypertension Maternal Uncle    Cancer Maternal Uncle        bone   Diabetes Maternal Grandmother    Cancer Maternal Grandmother    Diabetes Maternal Grandfather    Cancer Maternal Uncle        lung   COPD Neg Hx    Heart disease Neg Hx    Stroke Neg Hx      Social History Social History   Tobacco Use   Smoking status: Every Day    Packs/day: 0.50    Years: 23.00    Pack years: 11.50    Types: Cigarettes   Smokeless tobacco: Never   Tobacco comments:    since age 63  Vaping Use   Vaping Use: Never used  Substance Use Topics   Alcohol use: No   Drug use: Yes    Types: Marijuana    Review of Systems  Review of Systems  Constitutional:  Positive for fatigue. Negative for chills and fever.  HENT:  Negative for sore throat.   Respiratory:  Negative for shortness of breath.   Cardiovascular:  Negative for chest pain.  Gastrointestinal:  Positive for abdominal pain, nausea and vomiting.  Genitourinary:  Negative for flank pain.  Musculoskeletal:  Negative for neck pain.  Skin:  Positive for rash. Negative for wound.  Allergic/Immunologic: Negative for immunocompromised state.  Neurological:  Positive for weakness. Negative for numbness.  Hematological:  Does not bruise/bleed easily.  All other systems reviewed and are negative.   ____________________________________________  PHYSICAL EXAM:      VITAL SIGNS: ED Triage Vitals [03/21/21 0840]  Enc Vitals Group     BP (!) 151/85     Pulse Rate 80     Resp 18     Temp 98.4 F (36.9 C)     Temp Source Oral     SpO2 95 %     Weight 247 lb (112 kg)     Height 6\' 1"  (1.854 m)     Head Circumference      Peak Flow      Pain Score 0     Pain Loc      Pain Edu?      Excl. in [place]?      Physical Exam Vitals and nursing note reviewed.  Constitutional:      General: He is not in acute distress.    Appearance: He is well-developed.  HENT:     Head: Normocephalic and atraumatic.  Eyes:     Conjunctiva/sclera: Conjunctivae normal.  Cardiovascular:     Rate and Rhythm: Normal rate and regular rhythm.     Heart sounds: Normal heart sounds. No murmur heard.   No friction rub.  Pulmonary:     Effort: Pulmonary effort is normal. No respiratory distress.     Breath sounds:  Normal breath sounds. No wheezing or rales.  Abdominal:     General: Abdomen is flat. There is no distension.     Palpations: Abdomen is soft.     Tenderness: There is abdominal tenderness in the epigastric area. There is no guarding or rebound.  Musculoskeletal:     Cervical back: Neck supple.  Skin:    General: Skin is warm.     Capillary Refill: Capillary refill takes less than 2 seconds.  Comments: Paronychia noted to the right ulnar aspect of the thumb with visible purulence.  Mild surrounding erythema that does not extend to the flexor surface or beyond the interphalangeal joint.  Neurological:     Mental Status: He is alert and oriented to person, place, and time.     Motor: No abnormal muscle tone.      ____________________________________________   LABS (all labs ordered are listed, but only abnormal results are displayed)  Labs Reviewed  COMPREHENSIVE METABOLIC PANEL - Abnormal; Notable for the following components:      Result Value   Sodium 130 (*)    Chloride 94 (*)    Glucose, Bld 202 (*)    Albumin 3.4 (*)    AST 268 (*)    ALT 390 (*)    Alkaline Phosphatase 260 (*)    Total Bilirubin 5.4 (*)    All other components within normal limits  CBC WITH DIFFERENTIAL/PLATELET - Abnormal; Notable for the following components:   Platelets 126 (*)    Lymphs Abs 0.3 (*)    All other components within normal limits  URINALYSIS, COMPLETE (UACMP) WITH MICROSCOPIC - Abnormal; Notable for the following components:   Color, Urine AMBER (*)    APPearance CLEAR (*)    Specific Gravity, Urine >1.046 (*)    Glucose, UA >=500 (*)    Bilirubin Urine SMALL (*)    Ketones, ur 20 (*)    All other components within normal limits  LIPASE, BLOOD - Abnormal; Notable for the following components:   Lipase 128 (*)    All other components within normal limits  HIV ANTIBODY (ROUTINE TESTING W REFLEX)  HEPATITIS PANEL, ACUTE    ____________________________________________  EKG:   ________________________________________  RADIOLOGY All imaging, including plain films, CT scans, and ultrasounds, independently reviewed by me, and interpretations confirmed via formal radiology reads.  ED MD interpretation:   [place] RUQ: Negative CT A/P: Negative  Official radiology report(s): CT ABDOMEN PELVIS W CONTRAST  Result Date: 03/21/2021 CLINICAL DATA:  Abdominal pain EXAM: CT ABDOMEN AND PELVIS WITH CONTRAST TECHNIQUE: Multidetector CT imaging of the abdomen and pelvis was performed using the standard protocol following bolus administration of intravenous contrast. CONTRAST:  77mL OMNIPAQUE IOHEXOL 350 MG/ML SOLN COMPARISON:  None. FINDINGS: Lower chest: Linear opacities of the bilateral lower lungs, likely due to scarring or atelectasis. Hepatobiliary: No focal liver abnormality is seen. No gallstones, gallbladder wall thickening, or biliary dilatation. Pancreas: Unremarkable. No pancreatic ductal dilatation or surrounding inflammatory changes. Spleen: Normal in size without focal abnormality. Adrenals/Urinary Tract: Adrenal glands are unremarkable. Kidneys are normal, without renal calculi, focal lesion, or hydronephrosis. Bladder is unremarkable. Stomach/Bowel: Stomach is within normal limits. Appendix appears normal. No evidence of bowel wall thickening, distention, or inflammatory changes. Vascular/Lymphatic: Aortic atherosclerosis. No enlarged abdominal or pelvic lymph nodes. Reproductive: Prostate is unremarkable. Other: No abdominal wall hernia or abnormality. No abdominopelvic ascites. Musculoskeletal: No acute or significant osseous findings. IMPRESSION: No acute findings in the abdomen or pelvis. Aortic Atherosclerosis (ICD10-I70.0). Electronically Signed   By: Yetta Glassman M.D.   On: 03/21/2021 13:35   US Abdomen Limited RUQ (LIVER/GB)  Result Date: 03/21/2021 CLINICAL DATA:  Right upper quadrant pain and vomiting. EXAM: ULTRASOUND ABDOMEN LIMITED RIGHT UPPER QUADRANT  COMPARISON:  None. FINDINGS: Gallbladder: No gallstones or wall thickening visualized. No sonographic Murphy sign noted by sonographer. Common bile duct: Diameter: 5 mm common normal. Liver: No focal lesion identified. Within normal limits in parenchymal echogenicity. Portal vein is patent  on color Doppler imaging with normal direction of blood flow towards the liver. Other: None. IMPRESSION: Normal right upper quadrant ultrasound. No evidence of hepatobiliary disease. Electronically Signed   By: Nelson Chimes M.D.   On: 03/21/2021 11:55    ____________________________________________  PROCEDURES   Procedure(s) performed (including Critical Care):  Drain paronychia  Date/Time: 03/21/2021 2:33 PM Performed by: Duffy Bruce, MD Authorized by: Duffy Bruce, MD  Consent: Verbal consent obtained. Risks and benefits: risks, benefits and alternatives were discussed Time out: Immediately prior to procedure a "time out" was called to verify the correct patient, procedure, equipment, support staff and site/side marked as required. Preparation: Patient was prepped and draped in the usual sterile fashion. Local anesthesia used: yes Anesthesia: digital block  Anesthesia: Local anesthesia used: yes Local Anesthetic: bupivacaine 0.5% without epinephrine Anesthetic total: 2 mL  Sedation: Patient sedated: no  Patient tolerance: patient tolerated the procedure well with no immediate complications    ____________________________________________  INITIAL IMPRESSION / MDM / ASSESSMENT AND PLAN / ED COURSE  As part of my medical decision making, I reviewed the following data within the [place] notes reviewed and incorporated, Old chart reviewed, Notes from prior ED visits, and [place] Controlled Substance [place] was evaluated in Emergency Department on 03/21/2021 for the symptoms described in the history of present illness. He was evaluated  in the context of the global COVID-19 pandemic, which necessitated consideration that the patient might be at risk for infection with the SARS-CoV-2 virus that causes COVID-19. Institutional protocols and algorithms that pertain to the evaluation of patients at risk for COVID-19 are in a state of rapid change based on information released by regulatory bodies including the CDC and federal and state organizations. These policies and algorithms were followed during the patient's care in the ED.  Some ED evaluations and interventions may be delayed as a result of limited staffing during the pandemic.*     Medical Decision Making:  40 yo M here with multiple complaints.  Re: finger pain - exam is c/w paronychia. No signs of felon, flexor tenosynovitis, no FB. No signs to suggest bony involvement. No trauma. Digital block performed, I&D performed with expression of purulent material. Wrapped, advise warm compresses @ home and will switch to doxy given ? Intolerance of bactrim.  Re: abd pain, n/v - unclear etiology, suspect possible mild pancreatitis 2/2 diet or diabetes. Unlikely GB disease as US obtained and is negative, CT unremarkable. Labs show mild transaminitis, elevated bili. CBC unremarkable with normal WBC, HGb. Lipase 128. IVF given. He feels markedly improved after fluids and is tolerating po. Denies ETOH use or regular APAP use. Will send viral hep panel. Given resolution of sx, tolerance of PO, with neg U/S and CT, will advise outpt GI follow-up and supportive care. He is in agreement.   ____________________________________________  FINAL CLINICAL IMPRESSION(S) / ED DIAGNOSES  Final diagnoses:  RUQ pain  Transaminitis  Paronychia of right thumb     MEDICATIONS GIVEN DURING THIS VISIT:  Medications  morphine 4 MG/ML injection 4 mg (4 mg Intravenous Not Given 03/21/21 1330)  ondansetron (ZOFRAN) injection 4 mg (4 mg Intravenous Not Given 03/21/21 1450)  morphine 4 MG/ML injection 4 mg  (4 mg Intravenous Given 03/21/21 1044)  ondansetron (ZOFRAN) injection 4 mg (4 mg Intravenous Given 03/21/21 1044)  sodium chloride 0.9 % bolus 1,000 mL (0 mLs Intravenous Stopped 03/21/21 1253)  bupivacaine (MARCAINE) 0.5 %  injection 10 mL (10 mLs Infiltration Given 03/21/21 1254)  sodium chloride 0.9 % bolus 1,000 mL (1,000 mLs Intravenous New Bag/Given 03/21/21 1435)  iohexol (OMNIPAQUE) 350 MG/ML injection 75 mL (75 mLs Intravenous Contrast Given 03/21/21 1258)  cefTRIAXone (ROCEPHIN) 2 g in sodium chloride 0.9 % 100 mL IVPB (2 g Intravenous New Bag/Given 03/21/21 1448)     ED Discharge Orders          Ordered    doxycycline (VIBRAMYCIN) 100 MG capsule  2 times daily        03/21/21 1429    ondansetron (ZOFRAN ODT) 4 MG disintegrating tablet  Every 8 hours PRN        03/21/21 1429    pantoprazole (PROTONIX) 40 MG tablet  Daily        03/21/21 1429             Note:  This document was prepared using Dragon voice recognition software and may include unintentional dictation errors.   Duffy Bruce, MD 03/21/21 [phone]

## 2021-03-30 ENCOUNTER — Ambulatory Visit: Payer: Medicare Other | Admitting: Licensed Clinical Social Worker

## 2021-03-30 DIAGNOSIS — F419 Anxiety disorder, unspecified: Secondary | ICD-10-CM

## 2021-03-30 DIAGNOSIS — I152 Hypertension secondary to endocrine disorders: Secondary | ICD-10-CM

## 2021-03-30 DIAGNOSIS — E1165 Type 2 diabetes mellitus with hyperglycemia: Secondary | ICD-10-CM

## 2021-04-02 NOTE — Chronic Care Management (AMB) (Signed)
    Clinical Social Work  Care Management   Phone Outreach    04/02/2021 Name: KALVYN DESA MRN: [id number] DOB: 1981-05-13  [name] is a 40 y.o. year old male who is a primary care patient of Jon Billings, NP .   Reason for referral: Mental Health Counseling and Resources.    F/U phone call today to assess needs, progress and barriers with care plan goals.   Unable to keep phone appointment today and requested to reschedule.  Plan:Appointment was rescheduled with CCM LCSW  Review of patient status, including review of consultants reports, relevant laboratory and other test results, and collaboration with appropriate care team members and the patient's provider was performed as part of comprehensive patient evaluation and provision of care management services.

## 2021-04-06 ENCOUNTER — Ambulatory Visit (INDEPENDENT_AMBULATORY_CARE_PROVIDER_SITE_OTHER): Payer: Medicaid Other | Admitting: Licensed Clinical Social Worker

## 2021-04-06 DIAGNOSIS — E1165 Type 2 diabetes mellitus with hyperglycemia: Secondary | ICD-10-CM

## 2021-04-06 DIAGNOSIS — E1159 Type 2 diabetes mellitus with other circulatory complications: Secondary | ICD-10-CM

## 2021-04-06 DIAGNOSIS — E1169 Type 2 diabetes mellitus with other specified complication: Secondary | ICD-10-CM

## 2021-04-06 NOTE — Chronic Care Management (AMB) (Signed)
Chronic Care Management    Clinical Social Work Note  04/06/2021 Name: Steve [name] MRN: [id number] DOB: [birth year]  Steve [name] is a 40 y.o. year old male who is a primary care patient of Jon Billings, NP. The CCM team was consulted to assist the patient with chronic disease management and/or care coordination needs related to: Mental Health Counseling and Resources.   Engaged with patient by telephone for follow up visit in response to provider referral for social work chronic care management and care coordination services.   Consent to Services:  The patient was given information about Chronic Care Management services, agreed to services, and gave verbal consent prior to initiation of services.  Please see initial visit note for detailed documentation.   Patient agreed to services and consent obtained.   Consent to Services:  The patient was given information about Care Management services, agreed to services, and gave verbal consent prior to initiation of services.  Please see initial visit note for detailed documentation.   Patient agreed to services today and consent obtained.  Engaged with patient by phone in response to provider referral for social work care coordination services:  Assessment/Interventions:  Patient continues to maintain positive progress with care plan goals. Reports antibiotics and nausea medication resolved symptoms that resulted in ED visit 03/20/21. Patient is doing well with management of stress associated with health conditions.  See Care Plan below for interventions and patient self-care activities.  Recent life changes or stressors: Management of health conditions  Recommendation: Patient may benefit from, and is in agreement work with LCSW to address care coordination needs and will continue to work with the clinical team to address health care and disease management related needs.   Follow up Plan: Patient would like continued follow-up  from CCM LCSW .  per patient's request will follow up in 08/02/21.  Will call office if needed prior to next encounter.    SDOH (Social Determinants of Health) assessments and interventions performed:  SDOH Interventions    Flowsheet Row Most Recent Value  SDOH Interventions   Food Insecurity Interventions Intervention Not Indicated  Housing Interventions Intervention Not Indicated  Transportation Interventions Intervention Not Indicated        Advanced Directives Status: Not addressed in this encounter.  CCM Care Plan  No Known Allergies  Outpatient Encounter Medications as of 04/06/2021  Medication Sig   dapagliflozin propanediol (FARXIGA) 10 MG TABS tablet Take 1 tablet (10 mg total) by mouth daily before breakfast.   Dulaglutide (TRULICITY) 1.5 EX/9.3ZJ SOPN Inject 1.5 mg into the skin once a week.   ondansetron (ZOFRAN ODT) 4 MG disintegrating tablet Take 1 tablet (4 mg total) by mouth every 8 (eight) hours as needed for nausea or vomiting.   pantoprazole (PROTONIX) 40 MG tablet Take 1 tablet (40 mg total) by mouth daily for 14 days.   rosuvastatin (CRESTOR) 20 MG tablet Take 1 tablet (20 mg total) by mouth daily.   No facility-administered encounter medications on file as of 04/06/2021.    Patient Active Problem List   Diagnosis Date Noted   Hyperlipidemia associated with type 2 diabetes mellitus ([place]) 09/13/2020   Diarrhea    Loss of weight    Polyp of duodenum    Polyp of descending colon    Gastroesophageal reflux disease 11/17/2018   Hypertension associated with diabetes ([place]) 02/10/2017   Type 2 diabetes mellitus with hyperglycemia, without long-term current use of insulin ([place]) 11/16/2016   Tobacco use 11/14/2016  Conditions to be addressed/monitored:  Stress  Care Plan : Depression (Adult)  Updates made by Christa See D, LCSW since 04/06/2021 12:00 AM     Problem: Anxiety Identification (Depression)      Long-Range Goal: Anxiety Symptoms Identified    Start Date: 09/08/2020  This Visit's Progress: On track  Recent Progress: On track  Priority: Medium  Note:   Timeframe:  Long-Range Goal Priority:  Medium Start Date:   09/08/20                       Expected End Date:  08/30/21                   Follow Up Date- 08/02/21 Current Barriers:  Acute Mental Health needs related to Stress and Anxiety Financial constraints related to managing health care expenses Limited social support ADL IADL limitations Mental Health Concerns  Social Isolation Lacks knowledge of community resource: available mental health resources that accept Medicare/Medicaid Suicidal Ideation/Homicidal Ideation: No Clinical Social Work Goal(s):  Over the next 120 days, patient will work with SW to address concerns related to lack of support/resource connection. LCSW will assist patient in gaining additional support/resource connection and community resource education in order to maintain health and mental health appropriately  Over the next 120 days, patient will demonstrate improved adherence to self care as evidenced by implementing healthy self-care into his daily routine such as: attending all medical appointments, deep breathing exercises, taking time for self-reflection, taking medications as prescribed, drinking water and daily exercise to improve mobility.  Over the next 120 days, patient will demonstrate improved health management independence as evidenced by implementing healthy self-care skills and positive support/resources into his daily routine to help cope with stressors and improve overall health and well-being  Interventions: Patient interviewed and appropriate assessments performed Patient reports he is "doing okay" States he is trying to change habits to promote health and well-being 11/4: Patient visited ED on 10/18 due to nausea/vomiting and infection. Patient reports, "I'm better" He was prescribed antibiotics and nausea medication, which resolved  symptoms Patient reports compliance with medication management Patient reports doing well managing stress associated with health conditions. He denies SI/HIHe is not interested in medication management or therapy to assist with symptom management Patient reports having a strong support system Patient has identified that he wants to better manage diabetes better. States that he has been working on changing eating habits.  Provided patient with information about coping skills to implement into his daily routine to combat anxiety and panic 09/12: Patient denies symptoms of depression, anxiety, or stress SW used active and reflective listening, validated patient's feelings/concerns, and provided emotional support throughout entire session  LCSW discussed coping skills for anxiety/stress. SW used empathetic and active and reflective listening, validated patient's feelings/concerns, and provided emotional support. LCSW provided self-care education to help manage his multiple health conditions and improve his mood Discussed plans with patient for ongoing care management follow up and provided patient with direct contact information for care management team Collaboration with PCP regarding development and update of comprehensive plan of care as evidenced by provider attestation and co-signature Inter-disciplinary care team collaboration (see longitudinal plan of care) Patient Self Care Activities:  Continue to utilize healthy coping skills Attend all scheduled provider appointments Contact clinic with questions or concerns      Christa See, MSW, [email or web address] Phone [phone] 9:47 AM

## 2021-04-06 NOTE — Patient Instructions (Signed)
Visit Information  Patient verbalizes understanding of instructions provided today and agrees to view in [place].   Telephone follow up appointment with care management team member scheduled for:08/02/21  Christa See, MSW, [email or web address] Phone [phone] 9:48 AM

## 2021-05-02 DIAGNOSIS — I152 Hypertension secondary to endocrine disorders: Secondary | ICD-10-CM

## 2021-05-02 DIAGNOSIS — E785 Hyperlipidemia, unspecified: Secondary | ICD-10-CM

## 2021-05-02 DIAGNOSIS — E1159 Type 2 diabetes mellitus with other circulatory complications: Secondary | ICD-10-CM

## 2021-05-02 DIAGNOSIS — E1169 Type 2 diabetes mellitus with other specified complication: Secondary | ICD-10-CM

## 2021-05-02 DIAGNOSIS — E1165 Type 2 diabetes mellitus with hyperglycemia: Secondary | ICD-10-CM

## 2021-06-28 ENCOUNTER — Encounter: Payer: Self-pay | Admitting: Nurse Practitioner

## 2021-08-02 ENCOUNTER — Telehealth: Payer: Medicare Other

## 2021-08-03 ENCOUNTER — Other Ambulatory Visit: Payer: Self-pay | Admitting: Nurse Practitioner

## 2021-08-03 MED ORDER — TRULICITY 1.5 MG/0.5ML ~~LOC~~ SOAJ: 1.5000 mg | SUBCUTANEOUS | 1 refills | Status: DC

## 2021-08-03 NOTE — Telephone Encounter (Signed)
Copied from [place] [id number]. Topic: Quick Communication - Rx Refill/Question ?>> Aug 03, 2021  8:32 AM Leward Quan A wrote: ?Medication: Dulaglutide (TRULICITY) 1.5 HW/3.8UE SOPN  ? ?Has the patient contacted their pharmacy? Yes.  Was told to call office  ?(Agent: If no, request that the patient contact the pharmacy for the refill. If patient does not wish to contact the pharmacy document the reason why and proceed with request.) ?(Agent: If yes, when and what did the pharmacy advise?) ? ?Preferred Pharmacy (with phone number or street name): [id number] - [place], [place]  ?Phone:  [phone] ?Fax:  [phone] ? ? ? ?Has the patient been seen for an appointment in the last year OR does the patient have an upcoming appointment? Yes.   ? ?Agent: Please be advised that RX refills may take up to 3 business days. We ask that you follow-up with your pharmacy. ?

## 2021-08-03 NOTE — Telephone Encounter (Signed)
Requested medication (s) are due for refill today: yes ? ?Requested medication (s) are on the active medication list: yes ? ?Last refill:  09/14/20 #6/1 ? ?Future visit scheduled: yes ? ?Notes to clinic:  Unable to refill per protocol due to failed labs, no updated results. ? ?  ?Requested Prescriptions  ?Pending Prescriptions Disp Refills  ? Dulaglutide (TRULICITY) 1.5 GE/9.5MW SOPN 6 mL 1  ?  Sig: Inject 1.5 mg into the skin once a week.  ?  ? Endocrinology:  Diabetes - GLP-1 Receptor Agonists Failed - 08/03/2021  9:42 AM  ?  ?  Failed - HBA1C is between 0 and 7.9 and within 180 days  ?  HB A1C (BAYER DCA - WAIVED)  ?Date Value Ref Range Status  ?03/15/2020 7.6 (H) <7.0 % Final  ?  Comment:  ?                                        Diabetic Adult            <7.0 ?                                      Healthy Adult        4.3 - 5.7 ?                                                          (DCCT/NGSP) ?American Diabetes Association's Summary of Glycemic Recommendations ?for Adults with Diabetes: Hemoglobin A1c <7.0%. More stringent ?glycemic goals (A1c <6.0%) may further reduce complications at the ?cost of increased risk of hypoglycemia. ?  ? ?Hgb A1c MFr Bld  ?Date Value Ref Range Status  ?09/12/2020 9.9 (H) 4.8 - 5.6 % Final  ?  Comment:  ?           Prediabetes: 5.7 - 6.4 ?         Diabetes: >6.4 ?         Glycemic control for adults with diabetes: <7.0 ?  ?  ?  ?  ?  Passed - Valid encounter within last 6 months  ?  Recent Outpatient Visits   ? ?      ? 4 months ago Hypertension associated with diabetes ([place])  ? Sarah Ann, NP  ? 8 months ago Hypertension associated with diabetes [facility])  ? [place], NP  ? 10 months ago Type 2 diabetes mellitus with hyperglycemia, without long-term current use of insulin ([place])  ? [place], NP  ? 1 year ago Type 2 diabetes mellitus with hyperglycemia, without long-term current use of  insulin ([place])  ? [place], [place] P, DO  ? 1 year ago Type 2 diabetes mellitus with hyperglycemia, without long-term current use of insulin ([place])  ? [place], [place], [place]  ? ?  ?  ?Future Appointments   ? ?        ? In 1 month Jon Billings, NP [facility], [facility]  ? In 3 months  [place], [facility]  ? ?  ? ?  ?  ?  ? ?

## 2021-08-08 ENCOUNTER — Ambulatory Visit (INDEPENDENT_AMBULATORY_CARE_PROVIDER_SITE_OTHER): Payer: Medicare Other | Admitting: Licensed Clinical Social Worker

## 2021-08-08 DIAGNOSIS — E1165 Type 2 diabetes mellitus with hyperglycemia: Secondary | ICD-10-CM

## 2021-08-08 DIAGNOSIS — E1159 Type 2 diabetes mellitus with other circulatory complications: Secondary | ICD-10-CM

## 2021-08-08 DIAGNOSIS — I152 Hypertension secondary to endocrine disorders: Secondary | ICD-10-CM

## 2021-08-08 NOTE — Patient Instructions (Signed)
Visit Information ? ?Thank you for taking time to visit with me today. Please don't hesitate to contact me if I can be of assistance to you before our next scheduled telephone appointment. ? ?Following are the goals we discussed today:  ?Patient Self Care Activities:  ?Continue to utilize healthy coping skills ?Attend all scheduled provider appointments ?Contact clinic with questions or concerns ? ?If you are experiencing a Mental Health or [place] or need someone to talk to, please call the Suicide and Crisis Lifeline: [phone] ?call 911  ? ?Patient verbalizes understanding of instructions and care plan provided today and agrees to view in [place]. Active MyChart status confirmed with patient.   ? ?No further follow up required: Patient has met all care plan goals with LCSW ? ?Christa See, MSW, LCSW ?[facility] ?[facility] ?[email or web address] ?Phone [phone] ?10:46 AM ? ?

## 2021-08-08 NOTE — Chronic Care Management (AMB) (Signed)
?Chronic Care Management  ? ? Clinical Social Work Note ? ?08/08/2021 ?Name: Steve [name] MRN: [id number] DOB: 11/19/1980 ? ?Steve [name] is a 40 y.o. year old male who is a primary care patient of Holdsworth, Karen, NP. The CCM team was consulted to assist the patient with chronic disease management and/or care coordination needs related to: Mental Health Counseling and Resources.  ? ?Engaged with patient by telephone for follow up visit in response to provider referral for social work chronic care management and care coordination services.  ? ?Consent to Services:  ?The patient was given information about Chronic Care Management services, agreed to services, and gave verbal consent prior to initiation of services.  Please see initial visit note for detailed documentation.  ? ?Patient agreed to services and consent obtained.  ? ?Summary:  Patient continues to maintain positive progress with care plan goals. He reports management of anxiety symptoms and/or stress. See Care Plan below for interventions and patient self-care actives. ? ?Recommendation: Patient may benefit from, and is in agreement with continuing to work with PCP and additional CCM team to assist with managing health conditions.  ? ?Follow up Plan: All care plan goals have been met. Will disconnect from care team after this encounter. Patient has been informed to contact the office if new needs arise.   ? ?SDOH (Social Determinants of Health) assessments and interventions performed:   ? ?Advanced Directives Status: Not addressed in this encounter. ? ?CCM Care Plan ? ?No Known Allergies ? ?Outpatient Encounter Medications as of 08/08/2021  ?Medication Sig  ? dapagliflozin propanediol (FARXIGA) 10 MG TABS tablet Take 1 tablet (10 mg total) by mouth daily before breakfast.  ? Dulaglutide (TRULICITY) 1.5 MG/0.5ML SOPN Inject 1.5 mg into the skin once a week.  ? ondansetron (ZOFRAN ODT) 4 MG disintegrating tablet Take 1 tablet (4 mg total) by mouth  every 8 (eight) hours as needed for nausea or vomiting.  ? pantoprazole (PROTONIX) 40 MG tablet Take 1 tablet (40 mg total) by mouth daily for 14 days.  ? rosuvastatin (CRESTOR) 20 MG tablet Take 1 tablet (20 mg total) by mouth daily.  ? ?No facility-administered encounter medications on file as of 08/08/2021.  ? ? ?Patient Active Problem List  ? Diagnosis Date Noted  ? Hyperlipidemia associated with type 2 diabetes mellitus (HCC) 09/13/2020  ? Diarrhea   ? Loss of weight   ? Polyp of duodenum   ? Polyp of descending colon   ? Gastroesophageal reflux disease 11/17/2018  ? Hypertension associated with diabetes (HCC) 02/10/2017  ? Type 2 diabetes mellitus with hyperglycemia, without long-term current use of insulin (HCC) 11/16/2016  ? Tobacco use 11/14/2016  ? ? ?Conditions to be addressed/monitored: HTN and DMII ? ?Care Plan : Depression (Adult)  ?Updates made by Lewis, Jasmine D, LCSW since 08/08/2021 12:00 AM  ?  ? ?Problem: Anxiety Identification (Depression)   ?  ? ?Long-Range Goal: Anxiety Symptoms Identified Completed 08/08/2021  ?Start Date: 09/08/2020  ?This Visit's Progress: On track  ?Recent Progress: On track  ?Priority: Medium  ?Note:   ?Current Barriers:  ?Acute Mental Health needs related to Stress and Anxiety ?Financial constraints related to managing health care expenses ?Limited social support ?ADL IADL limitations ?Mental Health Concerns  ?Social Isolation ?Lacks knowledge of community resource: available mental health resources that accept Medicare/Medicaid ?Suicidal Ideation/Homicidal Ideation: No ?Clinical Social Work Goal(s):  ?Over the next 120 days, patient will work with SW to address concerns related to lack of support/resource   connection. LCSW will assist patient in gaining additional support/resource connection and community resource education in order to maintain health and mental health appropriately  ?Over the next 120 days, patient will demonstrate improved adherence to self care as evidenced  by implementing healthy self-care into his daily routine such as: attending all medical appointments, deep breathing exercises, taking time for self-reflection, taking medications as prescribed, drinking water and daily exercise to improve mobility.  ?Over the next 120 days, patient will demonstrate improved health management independence as evidenced by implementing healthy self-care skills and positive support/resources into his daily routine to help cope with stressors and improve overall health and well-being  ?Interventions: ?Patient interviewed and appropriate assessments performed ?Patient reports he is ?doing okay? States he is trying to change habits to promote health and well-being 11/4: Patient visited ED on 10/18 due to nausea/vomiting and infection. Patient reports, "I'm better" He was prescribed antibiotics and nausea medication, which resolved symptoms 3/8: Patient reports management of anxiety symptoms and/or stress ?Patient reports continued compliance with medication management ?Patient reports doing well managing stress associated with health conditions. He denies SI/HIHe is not interested in medication management or therapy to assist with symptom management ?Patient reports having a strong support system ?Patient has identified that he wants to better manage diabetes better. States that he has been working on changing eating habits.  ?Provided patient with information about coping skills to implement into his daily routine to combat anxiety and panic 09/12: Patient denies symptoms of depression, anxiety, or stress ?SW used active and reflective listening, validated patient's feelings/concerns, and provided emotional support throughout entire session  ?LCSW discussed coping skills for anxiety/stress. SW used empathetic and active and reflective listening, validated patient's feelings/concerns, and provided emotional support. LCSW provided self-care education to help manage his multiple health  conditions and improve his mood ?Discussed plans with patient for ongoing care management follow up and provided patient with direct contact information for care management team ?Collaboration with PCP regarding development and update of comprehensive plan of care as evidenced by provider attestation and co-signature ?Inter-disciplinary care team collaboration (see longitudinal plan of care) ?Patient Self Care Activities:  ?Continue to utilize healthy coping skills ?Attend all scheduled provider appointments ?Contact clinic with questions or concerns ?  ?  ?Christa See, MSW, LCSW ?[facility] ?[facility] ?[email or web address] ?Phone [phone] ?10:44 AM ? ? ? ?

## 2021-08-31 DIAGNOSIS — I10 Essential (primary) hypertension: Secondary | ICD-10-CM

## 2021-08-31 DIAGNOSIS — E1169 Type 2 diabetes mellitus with other specified complication: Secondary | ICD-10-CM

## 2021-09-12 ENCOUNTER — Ambulatory Visit: Payer: Medicare Other | Admitting: Nurse Practitioner

## 2021-09-18 DIAGNOSIS — I700 Atherosclerosis of aorta: Secondary | ICD-10-CM | POA: Insufficient documentation

## 2021-09-18 NOTE — Progress Notes (Deleted)
There were no vitals taken for this visit.   Subjective:    Patient ID: Steve [name], male    DOB: [birth year], 41 y.o.   MRN: [id number]  HPI: Steve [name] is a 41 y.o. male presenting on 09/19/2021 for comprehensive medical examination. Current medical complaints include:{Blank single:19197::"none","***"}  He currently lives with: Interim Problems from his last visit: {Blank single:19197::"yes","no"}  HYPERTENSION / HYPERLIPIDEMIA Satisfied with current treatment? {Blank single:19197::"yes","no"} Duration of hypertension: {Blank single:19197::"chronic","months","years"} BP monitoring frequency: {Blank single:19197::"not checking","rarely","daily","weekly","monthly","a few times a day","a few times a week","a few times a month"} BP range:  BP medication side effects: {Blank single:19197::"yes","no"} Past BP meds: {Blank RCVELFYB:01751::"WCHE","NIDPOEUMPN","TIRWERXVQM/GQQPYPPJKD","TOIZTIWP","YKDXIPJASN","KNLZJQBHAL/PFXT","KWIOXBDZHG (bystolic)","carvedilol","chlorthalidone","clonidine","diltiazem","exforge HCT","HCTZ","irbesartan (avapro)","labetalol","lisinopril","lisinopril-HCTZ","losartan (cozaar)","methyldopa","nifedipine","olmesartan (benicar)","olmesartan-HCTZ","quinapril","ramipril","spironalactone","tekturna","valsartan","valsartan-HCTZ","verapamil"} Duration of hyperlipidemia: {Blank single:19197::"chronic","months","years"} Cholesterol medication side effects: {Blank single:19197::"yes","no"} Cholesterol supplements: {Blank multiple:19196::"none","fish oil","niacin","red yeast rice"} Past cholesterol medications: {Blank multiple:19196::"none","atorvastain (lipitor)","lovastatin (mevacor)","pravastatin (pravachol)","rosuvastatin (crestor)","simvastatin (zocor)","vytorin","fenofibrate (tricor)","gemfibrozil","ezetimide (zetia)","niaspan","lovaza"} Medication compliance: {Blank single:19197::"excellent compliance","good compliance","fair compliance","poor  compliance"} Aspirin: {Blank single:19197::"yes","no"} Recent stressors: {Blank single:19197::"yes","no"} Recurrent headaches: {Blank single:19197::"yes","no"} Visual changes: {Blank single:19197::"yes","no"} Palpitations: {Blank single:19197::"yes","no"} Dyspnea: {Blank single:19197::"yes","no"} Chest pain: {Blank single:19197::"yes","no"} Lower extremity edema: {Blank single:19197::"yes","no"} Dizzy/lightheaded: {Blank single:19197::"yes","no"}  DIABETES Hypoglycemic episodes:{Blank single:19197::"yes","no"} Polydipsia/polyuria: {Blank single:19197::"yes","no"} Visual disturbance: {Blank single:19197::"yes","no"} Chest pain: {Blank single:19197::"yes","no"} Paresthesias: {Blank single:19197::"yes","no"} Glucose Monitoring: {Blank single:19197::"yes","no"}  Accucheck frequency: {Blank single:19197::"Not Checking","Daily","BID","TID"}  Fasting glucose:  Post prandial:  Evening:  Before meals: Taking Insulin?: {Blank single:19197::"yes","no"}  Long acting insulin:  Short acting insulin: Blood Pressure Monitoring: {Blank single:19197::"not checking","rarely","daily","weekly","monthly","a few times a day","a few times a week","a few times a month"} Retinal Examination: {Blank single:19197::"Up to Date","Not up to Date"} Foot Exam: {Blank single:19197::"Up to Date","Not up to Date"} Diabetic Education: {Blank single:19197::"Completed","Not Completed"} Pneumovax: {Blank single:19197::"Up to Date","Not up to Date","unknown"} Influenza: {Blank single:19197::"Up to Date","Not up to Date","unknown"} Aspirin: {Blank single:19197::"yes","no"}  Depression Screen done today and results listed below:     11/27/2020   10:33 AM 11/09/2020   10:16 AM 11/03/2019    1:29 PM 09/09/2019    9:45 AM 02/17/2019   10:08 AM  Depression screen PHQ 2/9  Decreased Interest 0 0 0 0 1  Down, Depressed, Hopeless 0 1 0 0 0  PHQ - 2 Score 0 1 0 0 1  Altered sleeping  0   0  Tired, decreased energy  0   2   Change in appetite  3   1  Feeling bad or failure about yourself   1   0  Trouble concentrating  1   0  Moving slowly or fidgety/restless  0   0  Suicidal thoughts  0   0  PHQ-9 Score  6   4  Difficult doing work/chores  Not difficult at all       The patient {has/does not DJME:26834} a history of falls. I {did/did not:19850} complete a risk assessment for falls. A plan of care for falls {was/was not:19852} documented.   Past Medical History:  Past Medical History:  Diagnosis Date   Diabetes mellitus without complication (HCC)    GERD (gastroesophageal reflux disease)    History of gastritis 09/2016   Hypertension     Surgical History:  Past Surgical History:  Procedure Laterality Date   COLONOSCOPY WITH PROPOFOL N/A 11/15/2019   Procedure: COLONOSCOPY WITH PROPOFOL WITH POLYPECTOMY, BIOPSIES;  Surgeon: Lucilla Lame, MD;  Location: [facility];  Service: Endoscopy;  Laterality: N/A;   ESOPHAGOGASTRODUODENOSCOPY (EGD) WITH PROPOFOL N/A 11/15/2019   Procedure: ESOPHAGOGASTRODUODENOSCOPY (EGD) WITH PROPOFOL WITH POLYPECTOMY, BIOPSIES;  Surgeon: Lucilla Lame, MD;  Location: [facility];  Service: Endoscopy;  Laterality: N/A;   TOOTH EXTRACTION      Medications:  Current Outpatient Medications on File Prior to Visit  Medication Sig   dapagliflozin propanediol (FARXIGA) 10 MG TABS tablet Take 1 tablet (10 mg total) by mouth daily before breakfast.   Dulaglutide (TRULICITY) 1.5 GX/2.1JH SOPN Inject 1.5 mg into the skin once a week.   ondansetron (ZOFRAN ODT) 4 MG disintegrating tablet Take 1 tablet (4 mg total) by mouth every 8 (eight) hours as needed for nausea or vomiting.   pantoprazole (PROTONIX) 40 MG tablet Take 1 tablet (40 mg total) by mouth daily for 14 days.   rosuvastatin (CRESTOR) 20 MG tablet Take 1 tablet (20 mg total) by mouth daily.   No current facility-administered medications on file prior to visit.    Allergies:  No Known Allergies  Social  History:  Social History   Socioeconomic History   Marital status: Single    Spouse name: Not on file   Number of children: Not on file   Years of education: Not on file   Highest education level: Not on file  Occupational History   Not on file  Tobacco Use   Smoking status: Every Day    Packs/day: 0.50    Years: 23.00    Pack years: 11.50    Types: Cigarettes   Smokeless tobacco: Never   Tobacco comments:    since age 69  Vaping Use   Vaping Use: Never used  Substance and Sexual Activity   Alcohol use: No   Drug use: Yes    Types: Marijuana   Sexual activity: Not on file  Other Topics Concern   Not on file  Social History Narrative   Not on file   Social Determinants of Health   Financial Resource Strain: Medium Risk   Difficulty of Paying Living Expenses: Somewhat hard  Food Insecurity: No Food Insecurity   Worried About Charity fundraiser in the Last Year: Never true   Ran Out of Food in the Last Year: Never true  Transportation Needs: No Transportation Needs   Lack of Transportation (Medical): No   Lack of Transportation (Non-Medical): No  Physical Activity: Sufficiently Active   Days of Exercise per Week: 3 days   Minutes of Exercise per Session: 90 min  Stress: No Stress Concern Present   Feeling of Stress : Only a little  Social Connections: Not on file  Intimate Partner Violence: Not on file   Social History   Tobacco Use  Smoking Status Every Day   Packs/day: 0.50   Years: 23.00   Pack years: 11.50   Types: Cigarettes  Smokeless Tobacco Never  Tobacco Comments   since age 66   Social History   Substance and Sexual Activity  Alcohol Use No    Family History:  Family History  Problem Relation Age of Onset   Diabetes Brother    Hypertension Brother    Diabetes Maternal Aunt    Hypertension Maternal Aunt    Cancer Maternal Aunt        pancreas   Diabetes Maternal Uncle    Hypertension Maternal Uncle    Cancer Maternal Uncle         bone   Diabetes Maternal Grandmother    Cancer Maternal Grandmother    Diabetes Maternal Grandfather    Cancer Maternal Uncle        lung   COPD Neg Hx    Heart disease Neg Hx  Stroke Neg Hx     Past medical history, surgical history, medications, allergies, family history and social history reviewed with patient today and changes made to appropriate areas of the chart.   ROS All other ROS negative except what is listed above and in the HPI.      Objective:    There were no vitals taken for this visit.  Wt Readings from Last 3 Encounters:  03/21/21 247 lb (112 kg)  03/14/21 245 lb (111.1 kg)  11/27/20 245 lb (111.1 kg)    Physical Exam  Results for orders placed or performed during the hospital encounter of 03/21/21  Comprehensive metabolic panel  Result Value Ref Range   Sodium 130 (L) 135 - 145 mmol/L   Potassium 3.8 3.5 - 5.1 mmol/L   Chloride 94 (L) 98 - 111 mmol/L   CO2 22 22 - 32 mmol/L   Glucose, Bld 202 (H) 70 - 99 mg/dL   BUN 11 6 - 20 mg/dL   Creatinine, Ser 0.82 0.61 - 1.24 mg/dL   Calcium 9.4 8.9 - 10.3 mg/dL   Total Protein 7.3 6.5 - 8.1 g/dL   Albumin 3.4 (L) 3.5 - 5.0 g/dL   AST 268 (H) 15 - 41 U/L   ALT 390 (H) 0 - 44 U/L   Alkaline Phosphatase 260 (H) 38 - 126 U/L   Total Bilirubin 5.4 (H) 0.3 - 1.2 mg/dL   GFR, Estimated >60 >60 mL/min   Anion gap 14 5 - 15  CBC with Differential  Result Value Ref Range   WBC 4.8 4.0 - 10.5 K/uL   RBC 5.03 4.22 - 5.81 MIL/uL   Hemoglobin 14.4 13.0 - 17.0 g/dL   HCT 41.8 39.0 - 52.0 %   MCV 83.1 80.0 - 100.0 fL   MCH 28.6 26.0 - 34.0 pg   MCHC 34.4 30.0 - 36.0 g/dL   RDW 12.1 11.5 - 15.5 %   Platelets 126 (L) 150 - 400 K/uL   nRBC 0.0 0.0 - 0.2 %   Neutrophils Relative % 84 %   Neutro Abs 4.0 1.7 - 7.7 K/uL   Lymphocytes Relative 6 %   Lymphs Abs 0.3 (L) 0.7 - 4.0 K/uL   Monocytes Relative 8 %   Monocytes Absolute 0.4 0.1 - 1.0 K/uL   Eosinophils Relative 1 %   Eosinophils Absolute 0.0 0.0 - 0.5 K/uL    Basophils Relative 1 %   Basophils Absolute 0.0 0.0 - 0.1 K/uL   Immature Granulocytes 0 %   Abs Immature Granulocytes 0.02 0.00 - 0.07 K/uL  Urinalysis, Complete w Microscopic  Result Value Ref Range   Color, Urine AMBER (A) YELLOW   APPearance CLEAR (A) CLEAR   Specific Gravity, Urine >1.046 (H) 1.005 - 1.030   pH 6.0 5.0 - 8.0   Glucose, UA >=500 (A) NEGATIVE mg/dL   Hgb urine dipstick NEGATIVE NEGATIVE   Bilirubin Urine SMALL (A) NEGATIVE   Ketones, ur 20 (A) NEGATIVE mg/dL   Protein, ur NEGATIVE NEGATIVE mg/dL   Nitrite NEGATIVE NEGATIVE   Leukocytes,Ua NEGATIVE NEGATIVE   RBC / HPF 0-5 0 - 5 RBC/hpf   WBC, UA 0-5 0 - 5 WBC/hpf   Bacteria, UA NONE SEEN NONE SEEN   Squamous Epithelial / LPF NONE SEEN 0 - 5   Mucus PRESENT   Lipase, blood  Result Value Ref Range   Lipase 128 (H) 11 - 51 U/L  HIV Antibody (routine testing w rflx)  Result Value Ref Range  HIV Screen 4th Generation wRfx Non Reactive Non Reactive      Assessment & Plan:   Problem List Items Addressed This Visit       Cardiovascular and Mediastinum   Hypertension associated with diabetes ([place]) - Primary   Atherosclerosis of aorta ([place])     Endocrine   Type 2 diabetes mellitus with hyperglycemia, without long-term current use of insulin (HCC)   Hyperlipidemia associated with type 2 diabetes mellitus ([place])     Discussed aspirin prophylaxis for myocardial infarction prevention and decision was {Blank single:19197::"it was not indicated","made to continue ASA","made to start ASA","made to stop ASA","that we recommended ASA, and patient refused"}  LABORATORY TESTING:  Health maintenance labs ordered today as discussed above.   The natural history of prostate cancer and ongoing controversy regarding screening and potential treatment outcomes of prostate cancer has been discussed with the patient. The meaning of a false positive PSA and a false negative PSA has been discussed. He indicates understanding of  the limitations of this screening test and wishes *** to proceed with screening PSA testing.   IMMUNIZATIONS:   - Tdap: Tetanus vaccination status reviewed: {tetanus status:315746}. - Influenza: {Blank single:19197::"Up to date","Administered today","Postponed to flu season","Refused","Given elsewhere"} - Pneumovax: {Blank single:19197::"Up to date","Administered today","Not applicable","Refused","Given elsewhere"} - Prevnar: {Blank single:19197::"Up to date","Administered today","Not applicable","Refused","Given elsewhere"} - COVID: {Blank single:19197::"Up to date","Administered today","Not applicable","Refused","Given elsewhere"} - HPV: {Blank single:19197::"Up to date","Administered today","Not applicable","Refused","Given elsewhere"} - Shingrix vaccine: {Blank single:19197::"Up to date","Administered today","Not applicable","Refused","Given elsewhere"}  SCREENING: - Colonoscopy: {Blank single:19197::"Up to date","Ordered today","Not applicable","Refused","Done elsewhere"}  Discussed with patient purpose of the colonoscopy is to detect colon cancer at curable precancerous or early stages   - AAA Screening: {Blank single:19197::"Up to date","Ordered today","Not applicable","Refused","Done elsewhere"}  -Hearing Test: {Blank single:19197::"Up to date","Ordered today","Not applicable","Refused","Done elsewhere"}  -Spirometry: {Blank single:19197::"Up to date","Ordered today","Not applicable","Refused","Done elsewhere"}   PATIENT COUNSELING:    Sexuality: Discussed sexually transmitted diseases, partner selection, use of condoms, avoidance of unintended pregnancy  and contraceptive alternatives.   Advised to avoid cigarette smoking.  I discussed with the patient that most people either abstain from alcohol or drink within safe limits (<=14/week and <=4 drinks/occasion for males, <=7/weeks and <= 3 drinks/occasion for females) and that the risk for alcohol disorders and other health effects  rises proportionally with the number of drinks per week and how often a drinker exceeds daily limits.  Discussed cessation/primary prevention of drug use and availability of treatment for abuse.   Diet: Encouraged to adjust caloric intake to maintain  or achieve ideal body weight, to reduce intake of dietary saturated fat and total fat, to limit sodium intake by avoiding high sodium foods and not adding table salt, and to maintain adequate dietary potassium and calcium preferably from fresh fruits, vegetables, and low-fat dairy products.    stressed the importance of regular exercise  Injury prevention: Discussed safety belts, safety helmets, smoke detector, smoking near bedding or upholstery.   Dental health: Discussed importance of regular tooth brushing, flossing, and dental visits.   Follow up plan: NEXT PREVENTATIVE PHYSICAL DUE IN 1 YEAR. No follow-ups on file.

## 2021-09-19 ENCOUNTER — Ambulatory Visit: Payer: Medicaid Other | Admitting: Nurse Practitioner

## 2021-09-19 DIAGNOSIS — E1159 Type 2 diabetes mellitus with other circulatory complications: Secondary | ICD-10-CM

## 2021-09-19 DIAGNOSIS — E1165 Type 2 diabetes mellitus with hyperglycemia: Secondary | ICD-10-CM

## 2021-09-19 DIAGNOSIS — E1169 Type 2 diabetes mellitus with other specified complication: Secondary | ICD-10-CM

## 2021-09-19 DIAGNOSIS — I700 Atherosclerosis of aorta: Secondary | ICD-10-CM

## 2021-10-15 ENCOUNTER — Telehealth: Payer: Self-pay | Admitting: Nurse Practitioner

## 2021-10-15 NOTE — Telephone Encounter (Signed)
Pt is calling to request a referral for a diabetic eye exam. Pt has medicare/[employer]/ ?CB- [phone] ?

## 2021-10-16 NOTE — Telephone Encounter (Signed)
Pt scheduled 5/18

## 2021-10-17 NOTE — Progress Notes (Deleted)
There were no vitals taken for this visit.   Subjective:    Patient ID: Steve [name], male    DOB: 06/15/1980, 41 y.o.   MRN: [id number]  HPI: Steve [name] is a 41 y.o. male presenting on 10/18/2021 for comprehensive medical examination. Current medical complaints include:{Blank single:19197::"none","***"}  He currently lives with: Interim Problems from his last visit: {Blank single:19197::"yes","no"}  HYPERTENSION / HYPERLIPIDEMIA Satisfied with current treatment? {Blank single:19197::"yes","no"} Duration of hypertension: {Blank single:19197::"chronic","months","years"} BP monitoring frequency: {Blank single:19197::"not checking","rarely","daily","weekly","monthly","a few times a day","a few times a week","a few times a month"} BP range:  BP medication side effects: {Blank single:19197::"yes","no"} Past BP meds: {Blank KPTWSFKC:12751::"ZGYF","VCBSWHQPRF","FMBWGYKZLD/JTTSVXBLTJ","QZESPQZR","AQTMAUQJFH","LKTGYBWLSL/HTDS","KAJGOTLXBW (bystolic)","carvedilol","chlorthalidone","clonidine","diltiazem","exforge HCT","HCTZ","irbesartan (avapro)","labetalol","lisinopril","lisinopril-HCTZ","losartan (cozaar)","methyldopa","nifedipine","olmesartan (benicar)","olmesartan-HCTZ","quinapril","ramipril","spironalactone","tekturna","valsartan","valsartan-HCTZ","verapamil"} Duration of hyperlipidemia: {Blank single:19197::"chronic","months","years"} Cholesterol medication side effects: {Blank single:19197::"yes","no"} Cholesterol supplements: {Blank multiple:19196::"none","fish oil","niacin","red yeast rice"} Past cholesterol medications: {Blank multiple:19196::"none","atorvastain (lipitor)","lovastatin (mevacor)","pravastatin (pravachol)","rosuvastatin (crestor)","simvastatin (zocor)","vytorin","fenofibrate (tricor)","gemfibrozil","ezetimide (zetia)","niaspan","lovaza"} Medication compliance: {Blank single:19197::"excellent compliance","good compliance","fair compliance","poor  compliance"} Aspirin: {Blank single:19197::"yes","no"} Recent stressors: {Blank single:19197::"yes","no"} Recurrent headaches: {Blank single:19197::"yes","no"} Visual changes: {Blank single:19197::"yes","no"} Palpitations: {Blank single:19197::"yes","no"} Dyspnea: {Blank single:19197::"yes","no"} Chest pain: {Blank single:19197::"yes","no"} Lower extremity edema: {Blank single:19197::"yes","no"} Dizzy/lightheaded: {Blank single:19197::"yes","no"}  DIABETES Hypoglycemic episodes:{Blank single:19197::"yes","no"} Polydipsia/polyuria: {Blank single:19197::"yes","no"} Visual disturbance: {Blank single:19197::"yes","no"} Chest pain: {Blank single:19197::"yes","no"} Paresthesias: {Blank single:19197::"yes","no"} Glucose Monitoring: {Blank single:19197::"yes","no"}  Accucheck frequency: {Blank single:19197::"Not Checking","Daily","BID","TID"}  Fasting glucose:  Post prandial:  Evening:  Before meals: Taking Insulin?: {Blank single:19197::"yes","no"}  Long acting insulin:  Short acting insulin: Blood Pressure Monitoring: {Blank single:19197::"not checking","rarely","daily","weekly","monthly","a few times a day","a few times a week","a few times a month"} Retinal Examination: {Blank single:19197::"Up to Date","Not up to Date"} Foot Exam: {Blank single:19197::"Up to Date","Not up to Date"} Diabetic Education: {Blank single:19197::"Completed","Not Completed"} Pneumovax: {Blank single:19197::"Up to Date","Not up to Date","unknown"} Influenza: {Blank single:19197::"Up to Date","Not up to Date","unknown"} Aspirin: {Blank single:19197::"yes","no"}  Depression Screen done today and results listed below:     11/27/2020   10:33 AM 11/09/2020   10:16 AM 11/03/2019    1:29 PM 09/09/2019    9:45 AM 02/17/2019   10:08 AM  Depression screen PHQ 2/9  Decreased Interest 0 0 0 0 1  Down, Depressed, Hopeless 0 1 0 0 0  PHQ - 2 Score 0 1 0 0 1  Altered sleeping  0   0  Tired, decreased energy  0   2   Change in appetite  3   1  Feeling bad or failure about yourself   1   0  Trouble concentrating  1   0  Moving slowly or fidgety/restless  0   0  Suicidal thoughts  0   0  PHQ-9 Score  6   4  Difficult doing work/chores  Not difficult at all       The patient {has/does not IOMB:55974} a history of falls. I {did/did not:19850} complete a risk assessment for falls. A plan of care for falls {was/was not:19852} documented.   Past Medical History:  Past Medical History:  Diagnosis Date   Diabetes mellitus without complication (HCC)    GERD (gastroesophageal reflux disease)    History of gastritis 09/2016   Hypertension     Surgical History:  Past Surgical History:  Procedure Laterality Date   COLONOSCOPY WITH PROPOFOL N/A 11/15/2019   Procedure: COLONOSCOPY WITH PROPOFOL WITH POLYPECTOMY, BIOPSIES;  Surgeon: Lucilla Lame, MD;  Location: [facility];  Service: Endoscopy;  Laterality: N/A;   ESOPHAGOGASTRODUODENOSCOPY (EGD) WITH PROPOFOL N/A 11/15/2019   Procedure: ESOPHAGOGASTRODUODENOSCOPY (EGD) WITH PROPOFOL WITH POLYPECTOMY, BIOPSIES;  Surgeon: Lucilla Lame, MD;  Location: [facility];  Service: Endoscopy;  Laterality: N/A;   TOOTH EXTRACTION      Medications:  Current Outpatient Medications on File Prior to Visit  Medication Sig   dapagliflozin propanediol (FARXIGA) 10 MG TABS tablet Take 1 tablet (10 mg total) by mouth daily before breakfast.   Dulaglutide (TRULICITY) 1.5 JY/7.8GN SOPN Inject 1.5 mg into the skin once a week.   ondansetron (ZOFRAN ODT) 4 MG disintegrating tablet Take 1 tablet (4 mg total) by mouth every 8 (eight) hours as needed for nausea or vomiting.   pantoprazole (PROTONIX) 40 MG tablet Take 1 tablet (40 mg total) by mouth daily for 14 days.   rosuvastatin (CRESTOR) 20 MG tablet Take 1 tablet (20 mg total) by mouth daily.   No current facility-administered medications on file prior to visit.    Allergies:  No Known Allergies  Social  History:  Social History   Socioeconomic History   Marital status: Single    Spouse name: Not on file   Number of children: Not on file   Years of education: Not on file   Highest education level: Not on file  Occupational History   Not on file  Tobacco Use   Smoking status: Every Day    Packs/day: 0.50    Years: 23.00    Pack years: 11.50    Types: Cigarettes   Smokeless tobacco: Never   Tobacco comments:    since age 31  Vaping Use   Vaping Use: Never used  Substance and Sexual Activity   Alcohol use: No   Drug use: Yes    Types: Marijuana   Sexual activity: Not on file  Other Topics Concern   Not on file  Social History Narrative   Not on file   Social Determinants of Health   Financial Resource Strain: Medium Risk   Difficulty of Paying Living Expenses: Somewhat hard  Food Insecurity: No Food Insecurity   Worried About Charity fundraiser in the Last Year: Never true   Ran Out of Food in the Last Year: Never true  Transportation Needs: No Transportation Needs   Lack of Transportation (Medical): No   Lack of Transportation (Non-Medical): No  Physical Activity: Sufficiently Active   Days of Exercise per Week: 3 days   Minutes of Exercise per Session: 90 min  Stress: No Stress Concern Present   Feeling of Stress : Only a little  Social Connections: Not on file  Intimate Partner Violence: Not on file   Social History   Tobacco Use  Smoking Status Every Day   Packs/day: 0.50   Years: 23.00   Pack years: 11.50   Types: Cigarettes  Smokeless Tobacco Never  Tobacco Comments   since age 28   Social History   Substance and Sexual Activity  Alcohol Use No    Family History:  Family History  Problem Relation Age of Onset   Diabetes Brother    Hypertension Brother    Diabetes Maternal Aunt    Hypertension Maternal Aunt    Cancer Maternal Aunt        pancreas   Diabetes Maternal Uncle    Hypertension Maternal Uncle    Cancer Maternal Uncle         bone   Diabetes Maternal Grandmother    Cancer Maternal Grandmother    Diabetes Maternal Grandfather    Cancer Maternal Uncle        lung   COPD Neg Hx    Heart disease Neg Hx  Stroke Neg Hx     Past medical history, surgical history, medications, allergies, family history and social history reviewed with patient today and changes made to appropriate areas of the chart.   ROS All other ROS negative except what is listed above and in the HPI.      Objective:    There were no vitals taken for this visit.  Wt Readings from Last 3 Encounters:  03/21/21 247 lb (112 kg)  03/14/21 245 lb (111.1 kg)  11/27/20 245 lb (111.1 kg)    Physical Exam  Results for orders placed or performed during the hospital encounter of 03/21/21  Comprehensive metabolic panel  Result Value Ref Range   Sodium 130 (L) 135 - 145 mmol/L   Potassium 3.8 3.5 - 5.1 mmol/L   Chloride 94 (L) 98 - 111 mmol/L   CO2 22 22 - 32 mmol/L   Glucose, Bld 202 (H) 70 - 99 mg/dL   BUN 11 6 - 20 mg/dL   Creatinine, Ser 0.82 0.61 - 1.24 mg/dL   Calcium 9.4 8.9 - 10.3 mg/dL   Total Protein 7.3 6.5 - 8.1 g/dL   Albumin 3.4 (L) 3.5 - 5.0 g/dL   AST 268 (H) 15 - 41 U/L   ALT 390 (H) 0 - 44 U/L   Alkaline Phosphatase 260 (H) 38 - 126 U/L   Total Bilirubin 5.4 (H) 0.3 - 1.2 mg/dL   GFR, Estimated >60 >60 mL/min   Anion gap 14 5 - 15  CBC with Differential  Result Value Ref Range   WBC 4.8 4.0 - 10.5 K/uL   RBC 5.03 4.22 - 5.81 MIL/uL   Hemoglobin 14.4 13.0 - 17.0 g/dL   HCT 41.8 39.0 - 52.0 %   MCV 83.1 80.0 - 100.0 fL   MCH 28.6 26.0 - 34.0 pg   MCHC 34.4 30.0 - 36.0 g/dL   RDW 12.1 11.5 - 15.5 %   Platelets 126 (L) 150 - 400 K/uL   nRBC 0.0 0.0 - 0.2 %   Neutrophils Relative % 84 %   Neutro Abs 4.0 1.7 - 7.7 K/uL   Lymphocytes Relative 6 %   Lymphs Abs 0.3 (L) 0.7 - 4.0 K/uL   Monocytes Relative 8 %   Monocytes Absolute 0.4 0.1 - 1.0 K/uL   Eosinophils Relative 1 %   Eosinophils Absolute 0.0 0.0 - 0.5 K/uL    Basophils Relative 1 %   Basophils Absolute 0.0 0.0 - 0.1 K/uL   Immature Granulocytes 0 %   Abs Immature Granulocytes 0.02 0.00 - 0.07 K/uL  Urinalysis, Complete w Microscopic  Result Value Ref Range   Color, Urine AMBER (A) YELLOW   APPearance CLEAR (A) CLEAR   Specific Gravity, Urine >1.046 (H) 1.005 - 1.030   pH 6.0 5.0 - 8.0   Glucose, UA >=500 (A) NEGATIVE mg/dL   Hgb urine dipstick NEGATIVE NEGATIVE   Bilirubin Urine SMALL (A) NEGATIVE   Ketones, ur 20 (A) NEGATIVE mg/dL   Protein, ur NEGATIVE NEGATIVE mg/dL   Nitrite NEGATIVE NEGATIVE   Leukocytes,Ua NEGATIVE NEGATIVE   RBC / HPF 0-5 0 - 5 RBC/hpf   WBC, UA 0-5 0 - 5 WBC/hpf   Bacteria, UA NONE SEEN NONE SEEN   Squamous Epithelial / LPF NONE SEEN 0 - 5   Mucus PRESENT   Lipase, blood  Result Value Ref Range   Lipase 128 (H) 11 - 51 U/L  HIV Antibody (routine testing w rflx)  Result Value Ref Range  HIV Screen 4th Generation wRfx Non Reactive Non Reactive      Assessment & Plan:   Problem List Items Addressed This Visit       Cardiovascular and Mediastinum   Hypertension associated with diabetes ([place]) - Primary   Atherosclerosis of aorta ([place])     Endocrine   Type 2 diabetes mellitus with hyperglycemia, without long-term current use of insulin (HCC)   Hyperlipidemia associated with type 2 diabetes mellitus ([place])     Discussed aspirin prophylaxis for myocardial infarction prevention and decision was {Blank single:19197::"it was not indicated","made to continue ASA","made to start ASA","made to stop ASA","that we recommended ASA, and patient refused"}  LABORATORY TESTING:  Health maintenance labs ordered today as discussed above.   The natural history of prostate cancer and ongoing controversy regarding screening and potential treatment outcomes of prostate cancer has been discussed with the patient. The meaning of a false positive PSA and a false negative PSA has been discussed. He indicates understanding of  the limitations of this screening test and wishes *** to proceed with screening PSA testing.   IMMUNIZATIONS:   - Tdap: Tetanus vaccination status reviewed: {tetanus status:315746}. - Influenza: {Blank single:19197::"Up to date","Administered today","Postponed to flu season","Refused","Given elsewhere"} - Pneumovax: {Blank single:19197::"Up to date","Administered today","Not applicable","Refused","Given elsewhere"} - Prevnar: {Blank single:19197::"Up to date","Administered today","Not applicable","Refused","Given elsewhere"} - COVID: {Blank single:19197::"Up to date","Administered today","Not applicable","Refused","Given elsewhere"} - HPV: {Blank single:19197::"Up to date","Administered today","Not applicable","Refused","Given elsewhere"} - Shingrix vaccine: {Blank single:19197::"Up to date","Administered today","Not applicable","Refused","Given elsewhere"}  SCREENING: - Colonoscopy: {Blank single:19197::"Up to date","Ordered today","Not applicable","Refused","Done elsewhere"}  Discussed with patient purpose of the colonoscopy is to detect colon cancer at curable precancerous or early stages   - AAA Screening: {Blank single:19197::"Up to date","Ordered today","Not applicable","Refused","Done elsewhere"}  -Hearing Test: {Blank single:19197::"Up to date","Ordered today","Not applicable","Refused","Done elsewhere"}  -Spirometry: {Blank single:19197::"Up to date","Ordered today","Not applicable","Refused","Done elsewhere"}   PATIENT COUNSELING:    Sexuality: Discussed sexually transmitted diseases, partner selection, use of condoms, avoidance of unintended pregnancy  and contraceptive alternatives.   Advised to avoid cigarette smoking.  I discussed with the patient that most people either abstain from alcohol or drink within safe limits (<=14/week and <=4 drinks/occasion for males, <=7/weeks and <= 3 drinks/occasion for females) and that the risk for alcohol disorders and other health effects  rises proportionally with the number of drinks per week and how often a drinker exceeds daily limits.  Discussed cessation/primary prevention of drug use and availability of treatment for abuse.   Diet: Encouraged to adjust caloric intake to maintain  or achieve ideal body weight, to reduce intake of dietary saturated fat and total fat, to limit sodium intake by avoiding high sodium foods and not adding table salt, and to maintain adequate dietary potassium and calcium preferably from fresh fruits, vegetables, and low-fat dairy products.    stressed the importance of regular exercise  Injury prevention: Discussed safety belts, safety helmets, smoke detector, smoking near bedding or upholstery.   Dental health: Discussed importance of regular tooth brushing, flossing, and dental visits.   Follow up plan: NEXT PREVENTATIVE PHYSICAL DUE IN 1 YEAR. No follow-ups on file.

## 2021-10-18 ENCOUNTER — Ambulatory Visit: Payer: Medicare Other | Admitting: Nurse Practitioner

## 2021-10-18 DIAGNOSIS — I152 Hypertension secondary to endocrine disorders: Secondary | ICD-10-CM

## 2021-10-18 DIAGNOSIS — E785 Hyperlipidemia, unspecified: Secondary | ICD-10-CM

## 2021-10-18 DIAGNOSIS — I700 Atherosclerosis of aorta: Secondary | ICD-10-CM

## 2021-10-18 DIAGNOSIS — E1165 Type 2 diabetes mellitus with hyperglycemia: Secondary | ICD-10-CM

## 2021-11-15 LAB — HM DIABETES EYE EXAM

## 2021-11-28 LAB — HM DIABETES EYE EXAM

## 2021-11-29 ENCOUNTER — Ambulatory Visit (INDEPENDENT_AMBULATORY_CARE_PROVIDER_SITE_OTHER): Payer: Medicare Other | Admitting: *Deleted

## 2021-11-29 DIAGNOSIS — Z0000 Encounter for general adult medical examination without abnormal findings: Secondary | ICD-10-CM | POA: Diagnosis not present

## 2021-11-29 NOTE — Patient Instructions (Signed)
[name] , Thank you for taking time to come for your Medicare Wellness Visit. I appreciate your ongoing commitment to your health goals. Please review the following plan we discussed and let me know if I can assist you in the future.   Screening recommendations/referrals: Colonoscopy: up to date Recommended yearly ophthalmology/optometry visit for glaucoma screening and checkup Recommended yearly dental visit for hygiene and checkup  Vaccinations: Influenza vaccine: Education provided Tdap vaccine: Education provided     Advanced directives: Education provided    Preventive Care 40-64 Years, Male Preventive care refers to lifestyle choices and visits with your health care provider that can promote health and wellness. What does preventive care include? A yearly physical exam. This is also called an annual well check. Dental exams once or twice a year. Routine eye exams. Ask your health care provider how often you should have your eyes checked. Personal lifestyle choices, including: Daily care of your teeth and gums. Regular physical activity. Eating a healthy diet. Avoiding tobacco and drug use. Limiting alcohol use. Practicing safe sex. Taking low-dose aspirin every day starting at age 46. What happens during an annual well check? The services and screenings done by your health care provider during your annual well check will depend on your age, overall health, lifestyle risk factors, and family history of disease. Counseling  Your health care provider may ask you questions about your: Alcohol use. Tobacco use. Drug use. Emotional well-being. Home and relationship well-being. Sexual activity. Eating habits. Work and work Statistician. Screening  You may have the following tests or measurements: Height, weight, and BMI. Blood pressure. Lipid and cholesterol levels. These may be checked every 5 years, or more frequently if you are over 79 years old. Skin check. Lung  cancer screening. You may have this screening every year starting at age 28 if you have a 30-pack-year history of smoking and currently smoke or have quit within the past 15 years. Fecal occult blood test (FOBT) of the stool. You may have this test every year starting at age 59. Flexible sigmoidoscopy or colonoscopy. You may have a sigmoidoscopy every 5 years or a colonoscopy every 10 years starting at age 81. Prostate cancer screening. Recommendations will vary depending on your family history and other risks. Hepatitis C blood test. Hepatitis B blood test. Sexually transmitted disease (STD) testing. Diabetes screening. This is done by checking your blood sugar (glucose) after you have not eaten for a while (fasting). You may have this done every 1-3 years. Discuss your test results, treatment options, and if necessary, the need for more tests with your health care provider. Vaccines  Your health care provider may recommend certain vaccines, such as: Influenza vaccine. This is recommended every year. Tetanus, diphtheria, and acellular pertussis (Tdap, Td) vaccine. You may need a Td booster every 10 years. Zoster vaccine. You may need this after age 27. Pneumococcal 13-valent conjugate (PCV13) vaccine. You may need this if you have certain conditions and have not been vaccinated. Pneumococcal polysaccharide (PPSV23) vaccine. You may need one or two doses if you smoke cigarettes or if you have certain conditions. Talk to your health care provider about which screenings and vaccines you need and how often you need them. This information is not intended to replace advice given to you by your health care provider. Make sure you discuss any questions you have with your health care provider. Document Released: 06/16/2015 Document Revised: 02/07/2016 Document Reviewed: 03/21/2015 Elsevier Interactive Patient Education  2017 [place]  Prevention in the Home Falls can cause injuries. They can  happen to people of all ages. There are many things you can do to make your home safe and to help prevent falls. What can I do on the outside of my home? Regularly fix the edges of walkways and driveways and fix any cracks. Remove anything that might make you trip as you walk through a door, such as a raised step or threshold. Trim any bushes or trees on the path to your home. Use bright outdoor lighting. Clear any walking paths of anything that might make someone trip, such as rocks or tools. Regularly check to see if handrails are loose or broken. Make sure that both sides of any steps have handrails. Any raised decks and porches should have guardrails on the edges. Have any leaves, snow, or ice cleared regularly. Use sand or salt on walking paths during winter. Clean up any spills in your garage right away. This includes oil or grease spills. What can I do in the bathroom? Use night lights. Install grab bars by the toilet and in the tub and shower. Do not use towel bars as grab bars. Use non-skid mats or decals in the tub or shower. If you need to sit down in the shower, use a plastic, non-slip stool. Keep the floor dry. Clean up any water that spills on the floor as soon as it happens. Remove soap buildup in the tub or shower regularly. Attach bath mats securely with double-sided non-slip rug tape. Do not have throw rugs and other things on the floor that can make you trip. What can I do in the bedroom? Use night lights. Make sure that you have a light by your bed that is easy to reach. Do not use any sheets or blankets that are too big for your bed. They should not hang down onto the floor. Have a firm chair that has side arms. You can use this for support while you get dressed. Do not have throw rugs and other things on the floor that can make you trip. What can I do in the kitchen? Clean up any spills right away. Avoid walking on wet floors. Keep items that you use a lot in  easy-to-reach places. If you need to reach something above you, use a strong step stool that has a grab bar. Keep electrical cords out of the way. Do not use floor polish or wax that makes floors slippery. If you must use wax, use non-skid floor wax. Do not have throw rugs and other things on the floor that can make you trip. What can I do with my stairs? Do not leave any items on the stairs. Make sure that there are handrails on both sides of the stairs and use them. Fix handrails that are broken or loose. Make sure that handrails are as long as the stairways. Check any carpeting to make sure that it is firmly attached to the stairs. Fix any carpet that is loose or worn. Avoid having throw rugs at the top or bottom of the stairs. If you do have throw rugs, attach them to the floor with carpet tape. Make sure that you have a light switch at the top of the stairs and the bottom of the stairs. If you do not have them, ask someone to add them for you. What else can I do to help prevent falls? Wear shoes that: Do not have high heels. Have rubber bottoms. Are comfortable and fit you  well. Are closed at the toe. Do not wear sandals. If you use a stepladder: Make sure that it is fully opened. Do not climb a closed stepladder. Make sure that both sides of the stepladder are locked into place. Ask someone to hold it for you, if possible. Clearly mark and make sure that you can see: Any grab bars or handrails. First and last steps. Where the edge of each step is. Use tools that help you move around (mobility aids) if they are needed. These include: Canes. Walkers. Scooters. Crutches. Turn on the lights when you go into a dark area. Replace any light bulbs as soon as they burn out. Set up your furniture so you have a clear path. Avoid moving your furniture around. If any of your floors are uneven, fix them. If there are any pets around you, be aware of where they are. Review your medicines  with your doctor. Some medicines can make you feel dizzy. This can increase your chance of falling. Ask your doctor what other things that you can do to help prevent falls. This information is not intended to replace advice given to you by your health care provider. Make sure you discuss any questions you have with your health care provider. Document Released: 03/16/2009 Document Revised: 10/26/2015 Document Reviewed: 06/24/2014 Elsevier Interactive Patient Education  2017 [employer].

## 2021-11-29 NOTE — Progress Notes (Signed)
Subjective:   Steve [name] is a 41 y.o. male who presents for Medicare Annual/Subsequent preventive examination.  I connected with  Steve [name] on 11/29/21 by a telephone enabled telemedicine application and verified that I am speaking with the correct person using two identifiers.   I discussed the limitations of evaluation and management by telemedicine. The patient expressed understanding and agreed to proceed.  Patient location: home  Provider location: Tele-Health-home    Review of Systems     Cardiac Risk Factors include: advanced age (>92mn, >>20women);hypertension;male gender;smoking/ tobacco exposure;obesity (BMI >30kg/m2);diabetes mellitus     Objective:    Today's Vitals   There is no height or weight on file to calculate BMI.     11/29/2021   11:16 AM 11/27/2020   10:32 AM 11/15/2019    9:47 AM 09/09/2019    9:46 AM 09/05/2016    9:26 AM 11/20/2015    8:09 PM 08/15/2015   12:31 PM  Advanced Directives  Does Patient Have a Medical Advance Directive? No No Yes No No No No  Type of Advance Directive   [place] Chart?   No - copy requested      Would patient like information on creating a medical advance directive? No - Patient declined          Current Medications (verified) Outpatient Encounter Medications as of 11/29/2021  Medication Sig   dapagliflozin propanediol (FARXIGA) 10 MG TABS tablet Take 1 tablet (10 mg total) by mouth daily before breakfast.   Dulaglutide (TRULICITY) 1.5 MJA/2.5KNSOPN Inject 1.5 mg into the skin once a week.   ondansetron (ZOFRAN ODT) 4 MG disintegrating tablet Take 1 tablet (4 mg total) by mouth every 8 (eight) hours as needed for nausea or vomiting.   rosuvastatin (CRESTOR) 20 MG tablet Take 1 tablet (20 mg total) by mouth daily.   pantoprazole (PROTONIX) 40 MG tablet Take 1 tablet (40 mg total) by mouth daily for 14 days.   No facility-administered  encounter medications on file as of 11/29/2021.    Allergies (verified) Patient has no known allergies.   History: Past Medical History:  Diagnosis Date   Diabetes mellitus without complication (HCC)    GERD (gastroesophageal reflux disease)    History of gastritis 09/2016   Hypertension    Past Surgical History:  Procedure Laterality Date   COLONOSCOPY WITH PROPOFOL N/A 11/15/2019   Procedure: COLONOSCOPY WITH PROPOFOL WITH POLYPECTOMY, BIOPSIES;  Surgeon: WLucilla Lame MD;  [facility] [facility]  Service: Endoscopy;  Laterality: N/A;   ESOPHAGOGASTRODUODENOSCOPY (EGD) WITH PROPOFOL N/A 11/15/2019   Procedure: ESOPHAGOGASTRODUODENOSCOPY (EGD) WITH PROPOFOL WITH POLYPECTOMY, BIOPSIES;  Surgeon: WLucilla Lame MD;  [facility] [facility]  Service: Endoscopy;  Laterality: N/A;   TOOTH EXTRACTION     Family History  Problem Relation Age of Onset   Diabetes Brother    Hypertension Brother    Diabetes Maternal Aunt    Hypertension Maternal Aunt    Cancer Maternal Aunt        pancreas   Diabetes Maternal Uncle    Hypertension Maternal Uncle    Cancer Maternal Uncle        bone   Diabetes Maternal Grandmother    Cancer Maternal Grandmother    Diabetes Maternal Grandfather    Cancer Maternal Uncle        lung   COPD Neg Hx    Heart  disease Neg Hx    Stroke Neg Hx    Social History   Socioeconomic History   Marital status: Single    Spouse name: Not on file   Number of children: Not on file   Years of education: Not on file   Highest education level: Not on file  Occupational History   Not on file  Tobacco Use   Smoking status: Every Day    Packs/day: 0.50    Years: 23.00    Total pack years: 11.50    Types: Cigarettes   Smokeless tobacco: Never   Tobacco comments:    since age 42  Vaping Use   Vaping Use: Never used  Substance and Sexual Activity   Alcohol use: No   Drug use: Yes    Types: Marijuana   Sexual activity: Yes  Other Topics Concern    Not on file  Social History Narrative   Not on file   Social Determinants of Health   Financial Resource Strain: High Risk (11/29/2021)   Overall Financial Resource Strain (CARDIA)    Difficulty of Paying Living Expenses: Hard  Food Insecurity: No Food Insecurity (11/29/2021)   Hunger Vital Sign    Worried About Running Out of Food in the Last Year: Never true    Ran Out of Food in the Last Year: Never true  Transportation Needs: No Transportation Needs (11/29/2021)   PRAPARE - Hydrologist (Medical): No    Lack of Transportation (Non-Medical): No  Physical Activity: Sufficiently Active (11/29/2021)   Exercise Vital Sign    Days of Exercise per Week: 5 days    Minutes of Exercise per Session: 50 min  Stress: No Stress Concern Present (11/29/2021)   [place]    Feeling of Stress : Not at all  Social Connections: Moderately Isolated (11/29/2021)   Social Connection and Isolation Panel [NHANES]    Frequency of Communication with Friends and Family: More than three times a week    Frequency of Social Gatherings with Friends and Family: Never    Attends Religious Services: Never    Printmaker: No    Attends Music therapist: More than 4 times per year    Marital Status: Never married    Tobacco Counseling Ready to quit: Not Answered Counseling given: Not Answered Tobacco comments: since age 49   Clinical Intake:  Pre-visit preparation completed: Yes  Pain : No/denies pain     Nutritional Risks: None Diabetes: No  How often do you need to have someone help you when you read instructions, pamphlets, or other written materials from your doctor or pharmacy?: 1 - Never  Diabetic?  Yes  Nutrition Risk Assessment:  Has the patient had any N/V/D within the last 2 months?  No  Does the patient have any non-healing wounds?  No  Has the  patient had any unintentional weight loss or weight gain?  No   Diabetes:  Is the patient diabetic?  Yes  If diabetic, was a CBG obtained today?  No  Did the patient bring in their glucometer from home?  No  How often do you monitor your CBG's? 2 x a week.   Financial Strains and Diabetes Management:  Are you having any financial strains with the device, your supplies or your medication? No .  Does the patient want to be seen by Chronic Care Management for management of their  diabetes?  No  Would the patient like to be referred to a Nutritionist or for Diabetic Management?  No   Diabetic Exams:  Diabetic Eye Exam:   Pt has been advised about the importance in completing this exam.   Diabetic Foot Exam:  Pt has been advised about the importance in completing this exam.   Interpreter Needed?: No  Information entered by :: Leroy Kennedy LPN   Activities of Daily Living    11/29/2021   12:21 PM  In your present state of health, do you have any difficulty performing the following activities:  Hearing? 0  Vision? 0  Difficulty concentrating or making decisions? 0  Walking or climbing stairs? 0  Dressing or bathing? 0  Doing errands, shopping? 0  Preparing Food and eating ? N  Using the Toilet? N  In the past six months, have you accidently leaked urine? N  Do you have problems with loss of bowel control? N  Managing your Medications? N  Managing your Finances? N  Housekeeping or managing your Housekeeping? N    Patient Care Team: Jon Billings, NP as PCP - General [place]on Kingfisher, Junita Push, [facility] (Inactive) (Pharmacist)  Indicate any recent Medical Services you may have received from other than [facility] providers in the past year (date may be approximate).     Assessment:   This is a routine wellness examination for [place].  Hearing/Vision screen No results found.  Dietary issues and exercise activities discussed: Current Exercise Habits: Structured exercise class, Type of  exercise: strength training/weights;treadmill;walking, Time (Minutes): 45, Frequency (Times/Week): 5, Weekly Exercise (Minutes/Week): 225, Intensity: Moderate   Goals Addressed   None    Depression Screen    11/29/2021   11:23 AM 11/27/2020   10:33 AM 11/09/2020   10:16 AM 11/03/2019    1:29 PM 09/09/2019    9:45 AM 02/17/2019   10:08 AM 11/14/2016    9:06 AM  PHQ 2/9 Scores  PHQ - 2 Score 1 0 1 0 0 1 1  PHQ- 9 Score '1  6   4     '$ Fall Risk    11/29/2021   11:19 AM 11/27/2020   10:33 AM 09/09/2019    9:42 AM 02/17/2019    9:23 AM  Fall Risk   Falls in the past year? 0 0 1 0  Number falls in past yr: 0  0   Injury with Fall? 0  0   Risk for fall due to :  Medication side effect    Follow up Falls evaluation completed;Education provided;Falls prevention discussed Falls evaluation completed;Education provided;Falls prevention discussed  Falls evaluation completed    FALL RISK PREVENTION PERTAINING TO THE HOME:  Any stairs in or around the home? Yes  If so, are there any without handrails? No  Home free of loose throw rugs in walkways, pet beds, electrical cords, etc? Yes  Adequate lighting in your home to reduce risk of falls? Yes   ASSISTIVE DEVICES UTILIZED TO PREVENT FALLS:  Life alert? No  Use of a cane, walker or w/c? No  Grab bars in the bathroom? Yes  Shower chair or bench in shower? No  Elevated toilet seat or a handicapped toilet? No   TIMED UP AND GO:  Was the test performed? No .    Cognitive Function:        11/29/2021   11:20 AM 11/27/2020   10:36 AM  6CIT Screen  What Year? 0 points 0 points  What month? 0 points  0 points  What time? 0 points 0 points  Count back from 20 0 points 0 points  Months in reverse 0 points 0 points  Repeat phrase 0 points 10 points  Total Score 0 points 10 points    Immunizations  There is no immunization history on file for this patient.  TDAP status: Due, Education has been provided regarding the importance of this  vaccine. Advised may receive this vaccine at local pharmacy or Health Dept. Aware to provide a copy of the vaccination record if obtained from local pharmacy or Health Dept. Verbalized acceptance and understanding.  Flu Vaccine status: Due, Education has been provided regarding the importance of this vaccine. Advised may receive this vaccine at local pharmacy or Health Dept. Aware to provide a copy of the vaccination record if obtained from local pharmacy or Health Dept. Verbalized acceptance and understanding.    Covid-19 vaccine status: Information provided on how to obtain vaccines.   Qualifies for Shingles Vaccine?   Zostavax completed     Screening Tests Health Maintenance  Topic Date Due   TETANUS/TDAP  Never done   OPHTHALMOLOGY EXAM  11/20/2018   HEMOGLOBIN A1C  03/14/2021   FOOT EXAM  03/15/2021   URINE MICROALBUMIN  03/15/2021   COVID-19 Vaccine (1) 12/15/2021 (Originally 08/24/1981)   INFLUENZA VACCINE  01/01/2022   COLONOSCOPY (Pts 45-10yr Insurance coverage will need to be confirmed)  11/14/2024   Hepatitis C Screening  Completed   HIV Screening  Completed   HPV VACCINES  Aged Out    Health Maintenance  Health Maintenance Due  Topic Date Due   TETANUS/TDAP  Never done   OPHTHALMOLOGY EXAM  11/20/2018   HEMOGLOBIN A1C  03/14/2021   FOOT EXAM  03/15/2021   URINE MICROALBUMIN  03/15/2021    Colorectal cancer screening: Type of screening: Colonoscopy. Completed 2021. Repeat every 5 years  Lung Cancer Screening: (Low Dose CT Chest recommended if Age 41-80years, 30 pack-year currently smoking OR have quit w/in 15years.) does not qualify.   Lung Cancer Screening Referral:   Additional Screening:  Hepatitis C Screening: does not qualify; Completed 2022  Vision Screening: Recommended annual ophthalmology exams for early detection of glaucoma and other disorders of the eye. Is the patient up to date with their annual eye exam?  No  Who is the provider or what is  the name of the office in which the patient attends annual eye exams?  If pt is not established with a provider, would they like to be referred to a provider to establish care? No .   Dental Screening: Recommended annual dental exams for proper oral hygiene  Community Resource Referral / Chronic Care Management: CRR required this visit?  No   CCM required this visit?  No      Plan:     I have personally reviewed and noted the following in the patient's chart:   Medical and social history Use of alcohol, tobacco or illicit drugs  Current medications and supplements including opioid prescriptions. Patient is not currently taking opioid prescriptions. Functional ability and status Nutritional status Physical activity Advanced directives List of other physicians Hospitalizations, surgeries, and ER visits in previous 12 months Vitals Screenings to include cognitive, depression, and falls Referrals and appointments  In addition, I have reviewed and discussed with patient certain preventive protocols, quality metrics, and best practice recommendations. A written personalized care plan for preventive services as well as general preventive health recommendations were provided to patient.  Leroy Kennedy, LPN   8/87/1959   Nurse Notes:

## 2021-11-30 ENCOUNTER — Telehealth: Payer: Self-pay

## 2021-11-30 ENCOUNTER — Ambulatory Visit: Payer: Medicare Other

## 2021-11-30 NOTE — Telephone Encounter (Signed)
   Telephone encounter was:  Unsuccessful.  11/30/2021 Name: Steve [name] MRN: [id number] DOB: 1981-01-05  Unsuccessful outbound call made today to assist with:  Food Insecurity  Outreach Attempt:  1st Attempt  A HIPAA compliant voice message was left requesting a return call.  Instructed patient to call back at [phone].  Shakeela Rabadan, AAS Paralegal, [facility]  [address], [place] [address] ??[email or web address]  ?? [phone]   [email or web address]

## 2021-12-07 ENCOUNTER — Telehealth: Payer: Self-pay

## 2021-12-07 NOTE — Telephone Encounter (Signed)
   Telephone encounter was:  Successful.  12/07/2021 Name: Steve [name] MRN: [id number] DOB: 26-Nov-1980  Steve [name] is a 41 y.o. year old male who is a primary care patient of Jon Billings, NP . The community resource team was consulted for assistance with Food Insecurity and Financial Difficulties related to utilities.  Care guide performed the following interventions: Spoke with patient about food pantries and [employer] emergency assistance program.  Verified email address [email or web address] sent information. Letter saved in Epic.  Follow Up Plan:  Care guide will follow up with patient by phone over the next 7 days.  Jermale Crass, AAS Paralegal, [facility]  [address], [place] [address] ??[email or web address]  ?? [phone]   [email or web address]

## 2021-12-18 ENCOUNTER — Telehealth: Payer: Self-pay

## 2021-12-18 NOTE — Telephone Encounter (Signed)
   Telephone encounter was:  Successful.  12/18/2021 Name: Steve [name] MRN: [id number] DOB: [birth year]  Steve [name] is a 41 y.o. year old male who is a primary care patient of Jon Billings, NP . The community resource team was consulted for assistance with Food Insecurity and Financial Difficulties related to utilities.  Care guide performed the following interventions: Spoke with patient he has received the email sent for food pantries and utility assistance.   Follow Up Plan:  No further follow up planned at this time. The patient has been provided with needed resources.  Steve [name], AAS Paralegal, Steve [name]  [address], [place] [address] ??[email or web address]  ?? [phone]   [email or web address]

## 2022-02-11 ENCOUNTER — Telehealth: Payer: Self-pay | Admitting: Nurse Practitioner

## 2022-02-11 NOTE — Telephone Encounter (Signed)
Patient called for a refill of the following medication:   Requested Prescriptions   Pending Prescriptions Disp Refills   dexlansoprazole (DEXILANT) 60 MG capsule 90 capsule 1    Sig: Take 1 capsule (60 mg total) by mouth daily.   [facility] sent to [id number] [place] [address]

## 2022-02-12 NOTE — Telephone Encounter (Signed)
Requested medication (s) are due for refill today: No  Requested medication (s) are on the active medication list: No  Last refill:  unknown  Future visit scheduled: no, seen 03/14/21 NO SHOW 09/19/21 and 10/18/21  Notes to clinic:  This med is not even on pt's current med list. It was d/c'd 03/21/2021, pt called in to request the refill, please assess.      Requested Prescriptions  Pending Prescriptions Disp Refills   dexlansoprazole (DEXILANT) 60 MG capsule 90 capsule 1    Sig: Take 1 capsule (60 mg total) by mouth daily.     Gastroenterology: Proton Pump Inhibitors Passed - 02/11/2022  9:28 AM      Passed - Valid encounter within last 12 months    Recent Outpatient Visits           11 months ago Hypertension associated with diabetes ([place])   [facility] Jon Billings, NP   1 year ago Hypertension associated with diabetes ([place])   [facility] Jon Billings, NP   1 year ago Type 2 diabetes mellitus with hyperglycemia, without long-term current use of insulin ([place])   [facility] Jon Billings, NP   1 year ago Type 2 diabetes mellitus with hyperglycemia, without long-term current use of insulin [facility])   [place], [place] P, DO   2 years ago Type 2 diabetes mellitus with hyperglycemia, without long-term current use of insulin [facility])   [place], [place], [place]

## 2022-02-13 NOTE — Telephone Encounter (Signed)
Pt called to report that he has been out since last week, he has scheduled an appt for tomorrow with PCP. Is upset that he has had to wait without correspondence. But I scheduled him for the next available. Wants to know if Rx can be called in today. Please advise

## 2022-02-13 NOTE — Telephone Encounter (Signed)
Spoke with patient regarding PCP's message, patient states " Let karen know thanks for nothing".

## 2022-02-13 NOTE — Telephone Encounter (Signed)
Patient will have to wait for appt to get his medication.

## 2022-02-14 ENCOUNTER — Ambulatory Visit (INDEPENDENT_AMBULATORY_CARE_PROVIDER_SITE_OTHER): Payer: Medicare Other | Admitting: Nurse Practitioner

## 2022-02-14 ENCOUNTER — Encounter: Payer: Self-pay | Admitting: Nurse Practitioner

## 2022-02-14 VITALS — BP 130/90 | HR 74 | Temp 98.5°F | Wt 248.3 lb

## 2022-02-14 DIAGNOSIS — E1169 Type 2 diabetes mellitus with other specified complication: Secondary | ICD-10-CM

## 2022-02-14 DIAGNOSIS — E785 Hyperlipidemia, unspecified: Secondary | ICD-10-CM

## 2022-02-14 DIAGNOSIS — I700 Atherosclerosis of aorta: Secondary | ICD-10-CM | POA: Diagnosis not present

## 2022-02-14 DIAGNOSIS — K219 Gastro-esophageal reflux disease without esophagitis: Secondary | ICD-10-CM

## 2022-02-14 DIAGNOSIS — E1159 Type 2 diabetes mellitus with other circulatory complications: Secondary | ICD-10-CM | POA: Diagnosis not present

## 2022-02-14 DIAGNOSIS — E1165 Type 2 diabetes mellitus with hyperglycemia: Secondary | ICD-10-CM

## 2022-02-14 DIAGNOSIS — I152 Hypertension secondary to endocrine disorders: Secondary | ICD-10-CM

## 2022-02-14 LAB — MICROALBUMIN, URINE WAIVED
Creatinine, Urine Waived: 100 mg/dL (ref 10–300)
Microalb, Ur Waived: 10 mg/L (ref 0–19)
Microalb/Creat Ratio: <30 mg/g (ref <30)

## 2022-02-14 MED ORDER — DEXLANSOPRAZOLE 60 MG PO CPDR: 60.0000 mg | DELAYED_RELEASE_CAPSULE | Freq: Every day | ORAL | 1 refills | Status: DC

## 2022-02-14 NOTE — Progress Notes (Unsigned)
BP (!) 130/90   Pulse 74   Temp 98.5 F (36.9 C) (Oral)   Wt 248 lb 4.8 oz (112.6 kg)   SpO2 98%   BMI 32.76 kg/m    Subjective:    Patient ID: Steve [name], male    DOB: 1981-05-28, 41 y.o.   MRN: [id number]  HPI: Steve [name] is a 41 y.o. male  Chief Complaint  Patient presents with   Nausea   HYPERTENSION / HYPERLIPIDEMIA Satisfied with current treatment? no Duration of hypertension: years BP monitoring frequency: not checking BP range:  BP medication side effects: no Past BP meds: none Duration of hyperlipidemia: years Cholesterol medication side effects: no Cholesterol supplements: none Past cholesterol medications: none Medication compliance: excellent compliance Aspirin: no Recent stressors: no Recurrent headaches: no Visual changes: no Palpitations: no Dyspnea: no Chest pain: no Lower extremity edema: no Dizzy/lightheaded: no  DIABETES Hypoglycemic episodes:no Polydipsia/polyuria: no Visual disturbance: no Chest pain: no Paresthesias: no Glucose Monitoring: no  Accucheck frequency: Not Checking  Fasting glucose:  Post prandial:  Evening:  Before meals: Taking Insulin?: no  Long acting insulin:  Short acting insulin: Blood Pressure Monitoring: not checking Retinal Examination: Not up to Date Foot Exam: Up to Date Diabetic Education: Not Completed Pneumovax: Not up to Date Influenza: Not up to Date Aspirin: no    Relevant past medical, surgical, family and social history reviewed and updated as indicated. Interim medical history since our last visit reviewed. Allergies and medications reviewed and updated.  Review of Systems  Eyes:  Negative for visual disturbance.  Respiratory:  Negative for chest tightness and shortness of breath.   Cardiovascular:  Negative for chest pain, palpitations and leg swelling.  Endocrine: Negative for polydipsia and polyuria.  Musculoskeletal:        Left thumb pain  Neurological:  Negative  for dizziness, light-headedness, numbness and headaches.    Per HPI unless specifically indicated above     Objective:    BP (!) 130/90   Pulse 74   Temp 98.5 F (36.9 C) (Oral)   Wt 248 lb 4.8 oz (112.6 kg)   SpO2 98%   BMI 32.76 kg/m   Wt Readings from Last 3 Encounters:  02/14/22 248 lb 4.8 oz (112.6 kg)  03/21/21 247 lb (112 kg)  03/14/21 245 lb (111.1 kg)    Physical Exam Vitals and nursing note reviewed.  Constitutional:      General: He is not in acute distress.    Appearance: Normal appearance. He is not ill-appearing, toxic-appearing or diaphoretic.  HENT:     Head: Normocephalic.     Right Ear: External ear normal.     Left Ear: External ear normal.     Nose: Nose normal. No congestion or rhinorrhea.     Mouth/Throat:     Mouth: Mucous membranes are moist.  Eyes:     General:        Right eye: No discharge.        Left eye: No discharge.     Extraocular Movements: Extraocular movements intact.     Conjunctiva/sclera: Conjunctivae normal.     Pupils: Pupils are equal, round, and reactive to light.  Cardiovascular:     Rate and Rhythm: Normal rate and regular rhythm.     Heart sounds: No murmur heard. Pulmonary:     Effort: Pulmonary effort is normal. No respiratory distress.     Breath sounds: Normal breath sounds. No wheezing, rhonchi or rales.  Abdominal:  General: Abdomen is flat. Bowel sounds are normal.  Musculoskeletal:       Arms:     Cervical back: Normal range of motion and neck supple.  Skin:    General: Skin is warm and dry.     Capillary Refill: Capillary refill takes less than 2 seconds.  Neurological:     General: No focal deficit present.     Mental Status: He is alert and oriented to person, place, and time.  Psychiatric:        Mood and Affect: Mood normal.        Behavior: Behavior normal.        Thought Content: Thought content normal.        Judgment: Judgment normal.     Results for orders placed or performed in visit  on 02/14/22  Comp Met (CMET)  Result Value Ref Range   Glucose 181 (H) 70 - 99 mg/dL   BUN 7 6 - 24 mg/dL   Creatinine, Ser 0.82 0.76 - 1.27 mg/dL   eGFR 114 >59 mL/min/1.73   BUN/Creatinine Ratio 9 9 - 20   Sodium 139 134 - 144 mmol/L   Potassium 4.1 3.5 - 5.2 mmol/L   Chloride 101 96 - 106 mmol/L   CO2 25 20 - 29 mmol/L   Calcium 10.0 8.7 - 10.2 mg/dL   Total Protein 6.9 6.0 - 8.5 g/dL   Albumin 4.3 4.1 - 5.1 g/dL   Globulin, Total 2.6 1.5 - 4.5 g/dL   Albumin/Globulin Ratio 1.7 1.2 - 2.2   Bilirubin Total 0.7 0.0 - 1.2 mg/dL   Alkaline Phosphatase 73 44 - 121 IU/L   AST 18 0 - 40 IU/L   ALT 25 0 - 44 IU/L  Lipid Profile  Result Value Ref Range   Cholesterol, Total 259 (H) 100 - 199 mg/dL   Triglycerides 367 (H) 0 - 149 mg/dL   HDL 30 (L) >39 mg/dL   VLDL Cholesterol Cal 70 (H) 5 - 40 mg/dL   LDL Chol Calc (NIH) 159 (H) 0 - 99 mg/dL   Chol/HDL Ratio 8.6 (H) 0.0 - 5.0 ratio  HgB A1c  Result Value Ref Range   Hgb A1c MFr Bld 10.7 (H) 4.8 - 5.6 %   Est. average glucose Bld gHb Est-mCnc 260 mg/dL  Microalbumin, Urine Waived  Result Value Ref Range   Microalb, Ur Waived 10 0 - 19 mg/L   Creatinine, Urine Waived 100 10 - 300 mg/dL   Microalb/Creat Ratio <30 <30 mg/g      Assessment & Plan:   Problem List Items Addressed This Visit       Cardiovascular and Mediastinum   Hypertension associated with diabetes (HCC)    Chronic. Elevated at visit today. Does not want to take medication for blood pressure.  Labs ordered today. Will make recommendations based on lab results.  Follow up in 3 months. Call sooner if concerns arise.       Atherosclerosis of aorta (HCC)    Chronic. Patient declines Statin therapy.          Digestive   Gastroesophageal reflux disease    Chronic.  Controlled.  Continue with current medication regimen of Dexilant.  Labs ordered today.  Return to clinic in 6 months for reevaluation.  Call sooner if concerns arise.        Relevant Medications    dexlansoprazole (DEXILANT) 60 MG capsule     Endocrine   Type 2 diabetes mellitus with hyperglycemia, without  long-term current use of insulin (HCC) - Primary    Chronic. Not well controlled.  Not compliant with [place].  Does take Trulicity weekly.  Will check labs at visit today.  Follow up in 3 months.  Call sooner if concerns arise.       Relevant Orders   Comp Met (CMET) (Completed)   HgB A1c (Completed)   Microalbumin, Urine Waived (Completed)   Hyperlipidemia associated with type 2 diabetes mellitus (HCC)    Chronic.  Advised patient to cook at home more.  Decrease amount of fast food he is eating.  Labs ordered today.  Will make further recommendations based on lab results.  Declines Statin in the setting of Type 2 DM.      Relevant Orders   Lipid Profile (Completed)     Follow up plan: No follow-ups on file.

## 2022-02-15 LAB — LIPID PANEL
Chol/HDL Ratio: 8.6 ratio — ABNORMAL HIGH (ref 0.0–5.0)
Cholesterol, Total: 259 mg/dL — ABNORMAL HIGH (ref 100–199)
HDL: 30 mg/dL — ABNORMAL LOW (ref >39)
LDL Chol Calc (NIH): 159 mg/dL — ABNORMAL HIGH (ref 0–99)
Triglycerides: 367 mg/dL — ABNORMAL HIGH (ref 0–149)
VLDL Cholesterol Cal: 70 mg/dL — ABNORMAL HIGH (ref 5–40)

## 2022-02-15 LAB — COMPREHENSIVE METABOLIC PANEL
ALT: 25 IU/L (ref 0–44)
AST: 18 IU/L (ref 0–40)
Albumin/Globulin Ratio: 1.7 (ref 1.2–2.2)
Albumin: 4.3 g/dL (ref 4.1–5.1)
Alkaline Phosphatase: 73 IU/L (ref 44–121)
BUN/Creatinine Ratio: 9 (ref 9–20)
BUN: 7 mg/dL (ref 6–24)
Bilirubin Total: 0.7 mg/dL (ref 0.0–1.2)
CO2: 25 mmol/L (ref 20–29)
Calcium: 10 mg/dL (ref 8.7–10.2)
Chloride: 101 mmol/L (ref 96–106)
Creatinine, Ser: 0.82 mg/dL (ref 0.76–1.27)
Globulin, Total: 2.6 g/dL (ref 1.5–4.5)
Glucose: 181 mg/dL — ABNORMAL HIGH (ref 70–99)
Potassium: 4.1 mmol/L (ref 3.5–5.2)
Sodium: 139 mmol/L (ref 134–144)
Total Protein: 6.9 g/dL (ref 6.0–8.5)
eGFR: 114 mL/min/{1.73_m2} (ref >59)

## 2022-02-15 LAB — HEMOGLOBIN A1C
Est. average glucose Bld gHb Est-mCnc: 260 mg/dL
Hgb A1c MFr Bld: 10.7 % — ABNORMAL HIGH (ref 4.8–5.6)

## 2022-02-15 NOTE — Assessment & Plan Note (Signed)
Chronic.  Controlled.  Continue with current medication regimen of Dexilant.  Labs ordered today.  Return to clinic in 6 months for reevaluation.  Call sooner if concerns arise.

## 2022-02-15 NOTE — Assessment & Plan Note (Signed)
Chronic. Patient declines Statin therapy.

## 2022-02-15 NOTE — Assessment & Plan Note (Signed)
Chronic. Elevated at visit today. Does not want to take medication for blood pressure.  Labs ordered today. Will make recommendations based on lab results.  Follow up in 3 months. Call sooner if concerns arise.

## 2022-02-15 NOTE — Assessment & Plan Note (Signed)
Chronic. Not well controlled.  Not compliant with Wilder Glade.  Does take Trulicity weekly.  Will check labs at visit today.  Follow up in 3 months.  Call sooner if concerns arise.

## 2022-02-15 NOTE — Assessment & Plan Note (Signed)
Chronic.  Advised patient to cook at home more.  Decrease amount of fast food he is eating.  Labs ordered today.  Will make further recommendations based on lab results.  Declines Statin in the setting of Type 2 DM.

## 2022-02-15 NOTE — Progress Notes (Signed)
Please let ptaient know that his A1c is not well controlled at 10.7.  I recommend he restart the [place].  If he needs a refill I am happy to send it for him.  I also recommend she change the Trulicity to a different injection called Mounjaro.  We will have more room to increase the dose as needed for better control.  If he agrees I will send it to the pharmacy for him.   Please let patient know that him cholesterol is elevated.  Him cardiac risk score puts him at high risk of having a stroke or heart attack over the next 10 years.  I recommend that he start a statin called crestor '5mg'$  daily.  The goal will be to increase this to '20mg'$  daily if patient tolerates it well.  If he agrees to the medication I can send it to the pharmacy.    The 10-year ASCVD risk score (Arnett DK, et al., 2019) is: 12.6%   Values used to calculate the score:     Age: 41 years     Sex: Male     Is Non-Hispanic African American: Yes     Diabetic: Yes     Tobacco smoker: Yes     Systolic Blood Pressure: 009 mmHg     Is BP treated: No     HDL Cholesterol: 30 mg/dL     Total Cholesterol: 259 mg/dL

## 2022-02-18 MED ORDER — ROSUVASTATIN CALCIUM 5 MG PO TABS: 5.0000 mg | ORAL_TABLET | Freq: Every day | ORAL | 1 refills | Status: DC

## 2022-02-18 MED ORDER — TIRZEPATIDE 2.5 MG/0.5ML ~~LOC~~ SOAJ: 2.5000 mg | SUBCUTANEOUS | 0 refills | Status: DC

## 2022-02-18 NOTE — Progress Notes (Signed)
Can we move patient's appt up to 1 month instead of 3.  Medications sent to the pharmacy.

## 2022-02-18 NOTE — Addendum Note (Signed)
Addended by: Jon Billings on: 02/18/2022 09:01 AM   Modules accepted: Orders, Level of Service

## 2022-02-27 IMAGING — US US ABDOMEN LIMITED
1 series · 14 of 25 positions shown · non-contrast
Comparison: None.

CLINICAL DATA: Right upper quadrant pain and vomiting.

EXAM:
ULTRASOUND ABDOMEN LIMITED RIGHT UPPER QUADRANT

[Series 1: us abdomen limited ruq (liver/gb) · 14 of 48 slices shown]
[im 1/48]
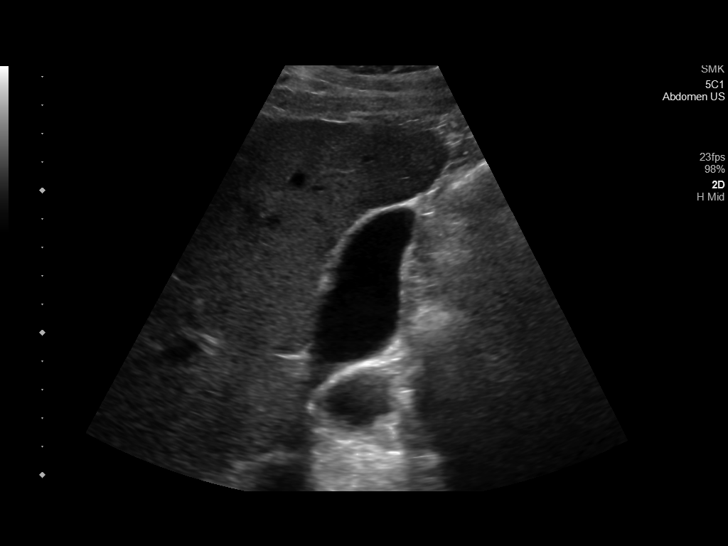
[im 4/48]
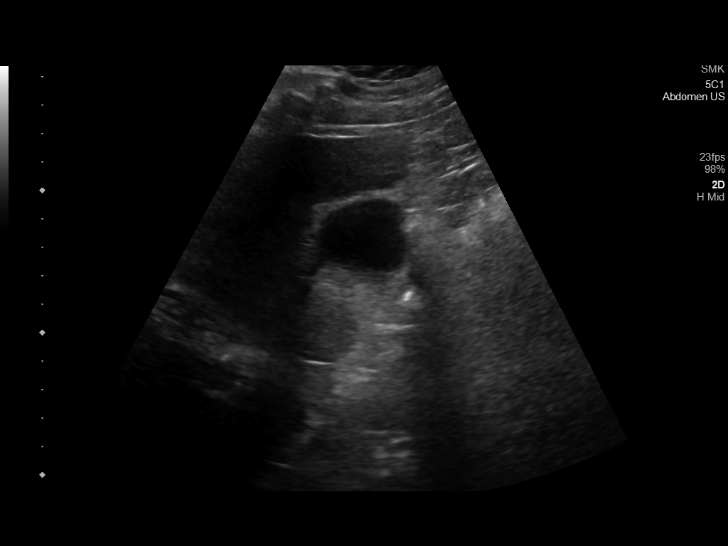
[im 8/48]
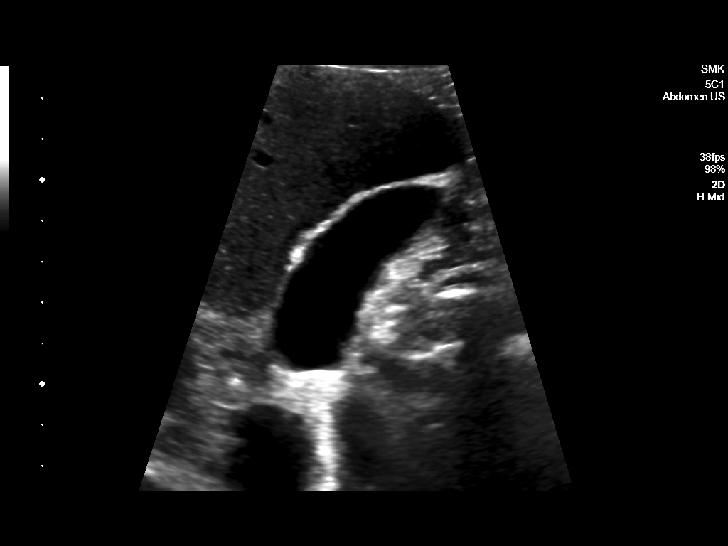
[im 12/48]
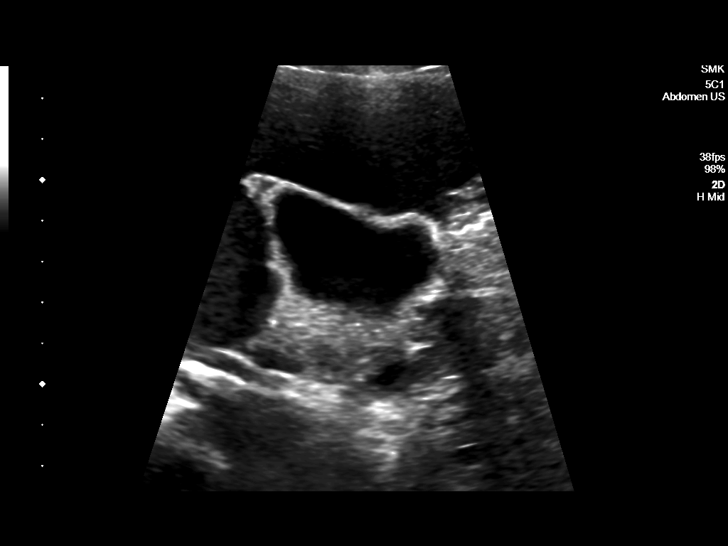
[im 16/48]
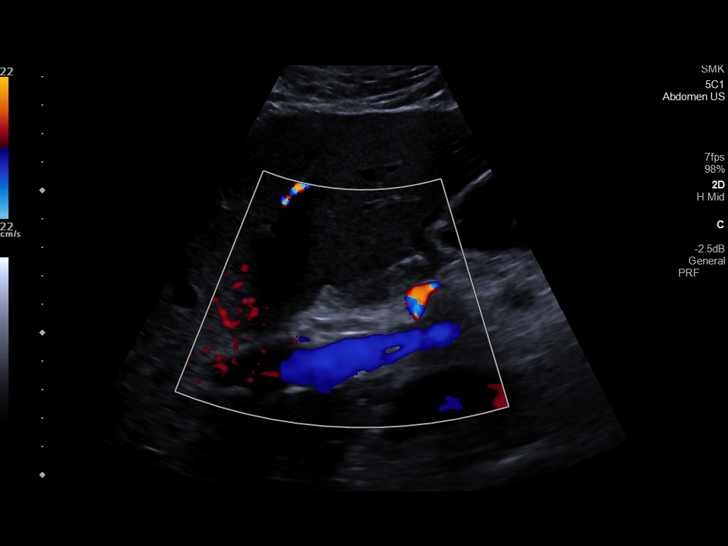
[im 18/48]
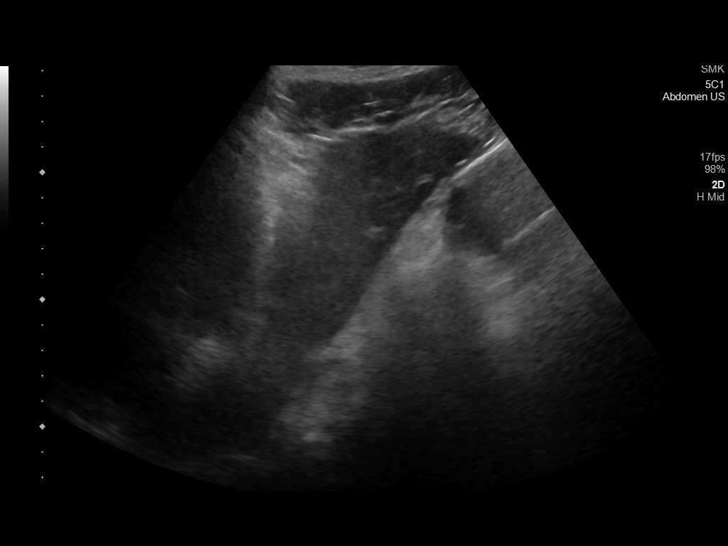
[im 22/48]
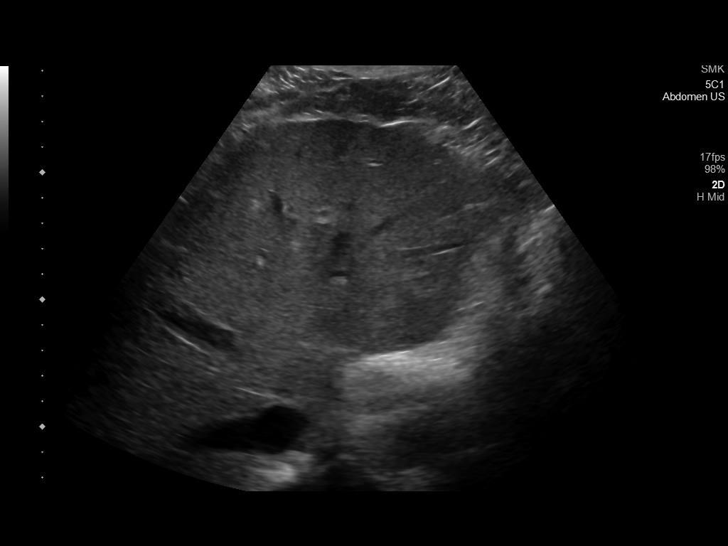
[im 26/48]
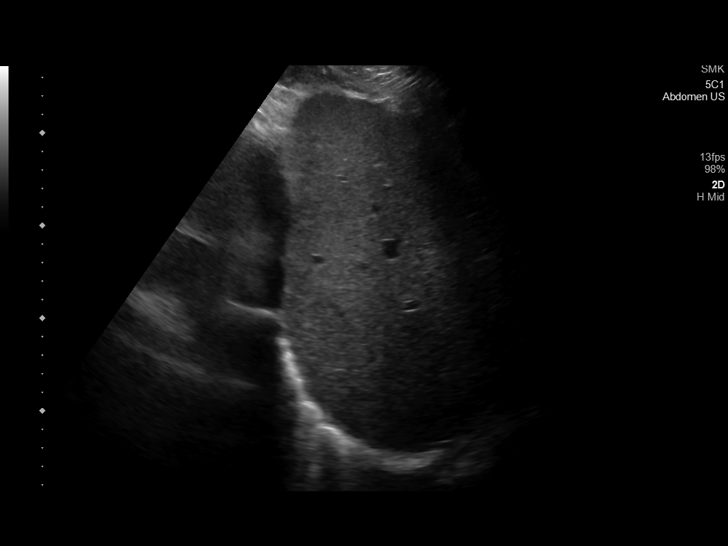
[im 30/48]
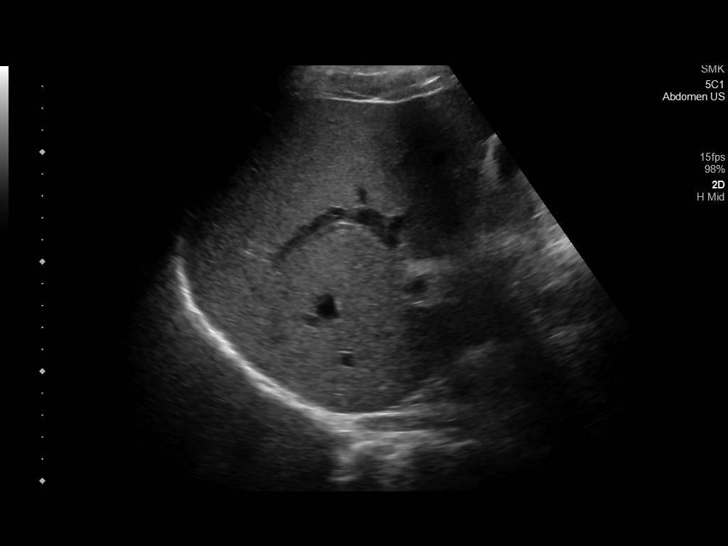
[im 32/48]
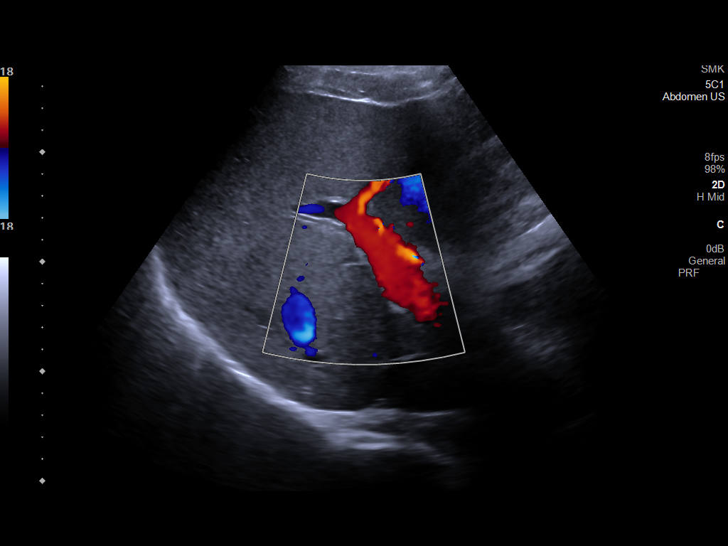
[im 36/48]
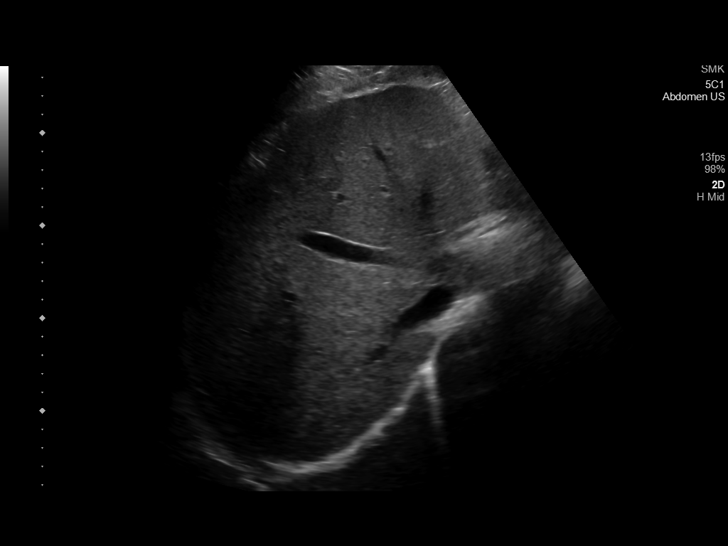
[im 40/48]
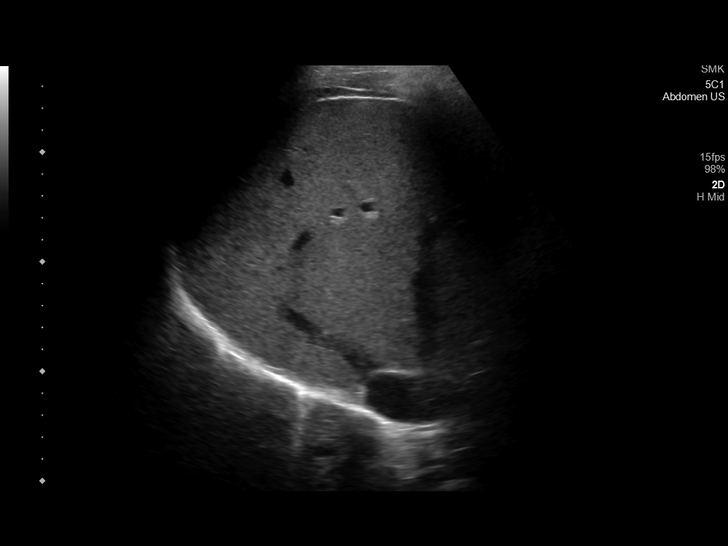
[im 44/48]
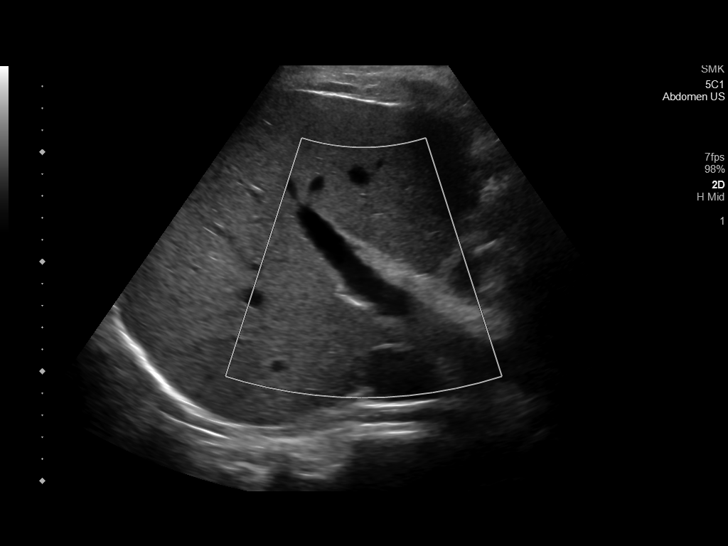
[im 48/48]
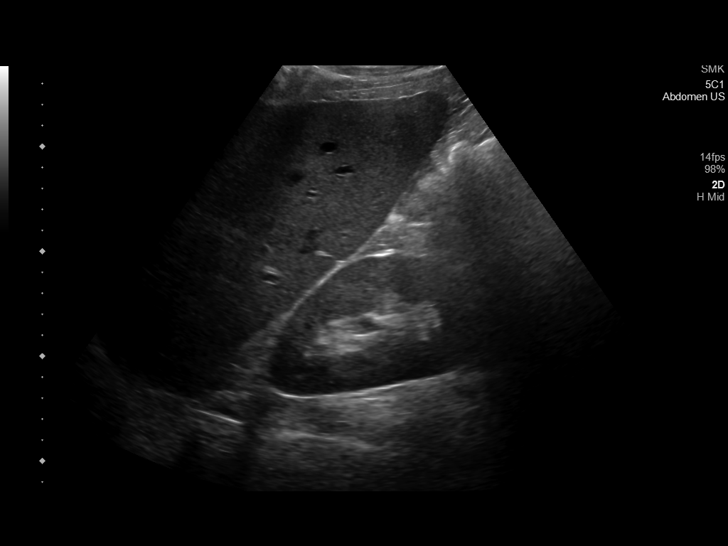

[14 of 25 positions shown; findings below may reference images not displayed]

FINDINGS: Gallbladder:

No gallstones or wall thickening visualized. No sonographic Murphy
sign noted by sonographer.

Common bile duct:

Diameter: 5 mm common normal.

Liver:

No focal lesion identified. Within normal limits in parenchymal
echogenicity. Portal vein is patent on color Doppler imaging with
normal direction of blood flow towards the liver.

Other: None.
IMPRESSION: Normal right upper quadrant ultrasound. No evidence of hepatobiliary
disease.

## 2022-02-27 IMAGING — CT CT ABD-PELV W/ CM
2 of 4 series · 16 of 46 positions shown, 18 images · IV contrast (APPLIED)
Comparison: None.

CLINICAL DATA: Abdominal pain

EXAM:
CT ABDOMEN AND PELVIS WITH CONTRAST
TECHNIQUE: Multidetector CT imaging of the abdomen and pelvis was performed
using the standard protocol following bolus administration of
intravenous contrast.
CONTRAST:  75mL OMNIPAQUE IOHEXOL 350 MG/ML SOLN

[Series 2: axial st · axial · 0.93mm/px · z∈[-580,-50]mm · 13 of 116 slices shown, 15 images]
[im 5/116  soft-tissue]
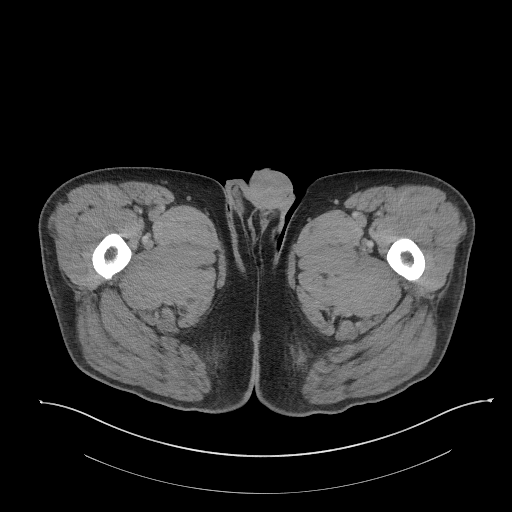
[im 5/116  bone]
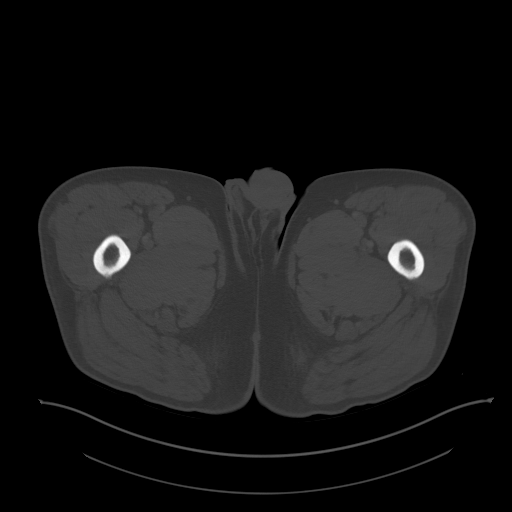
[im 15/116  soft-tissue]
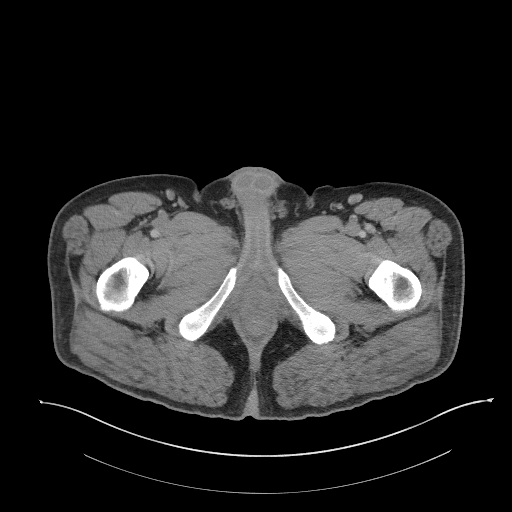
[im 24/116  soft-tissue]
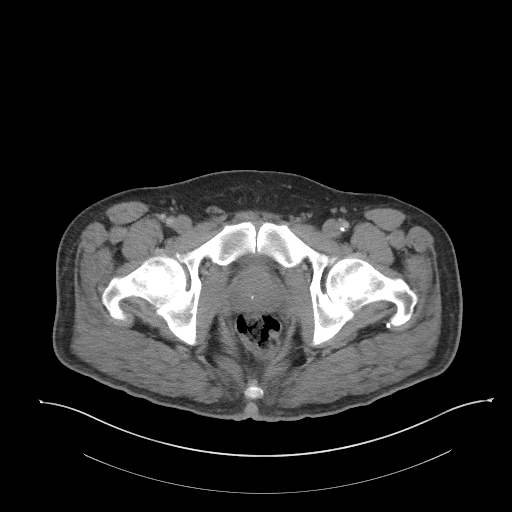
[im 34/116  soft-tissue]
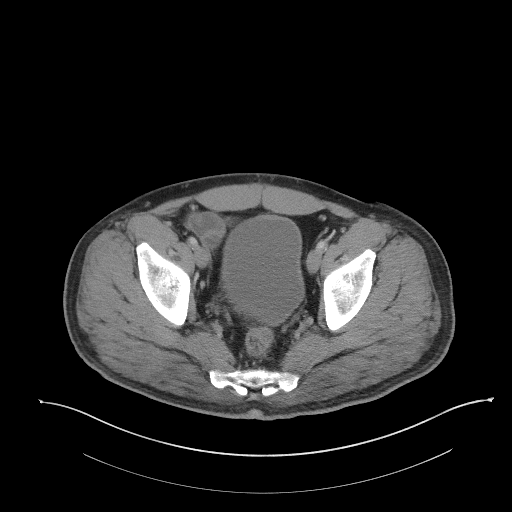
[im 39/116  soft-tissue]
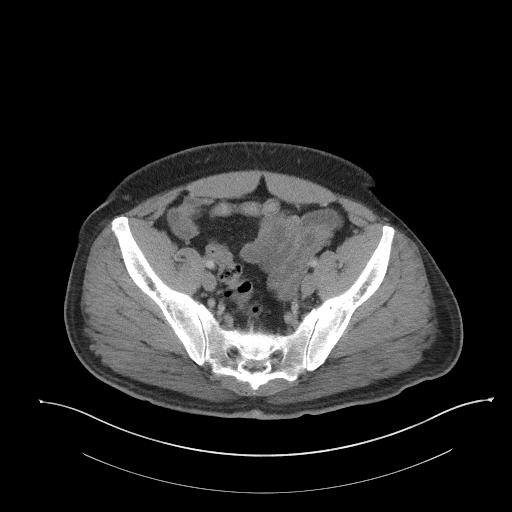
[im 48/116  soft-tissue]
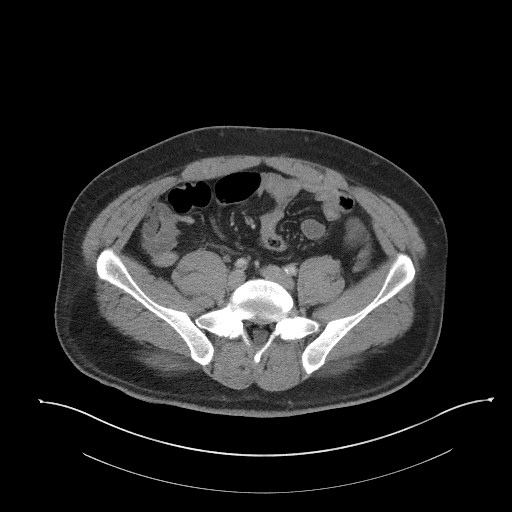
[im 58/116  soft-tissue]
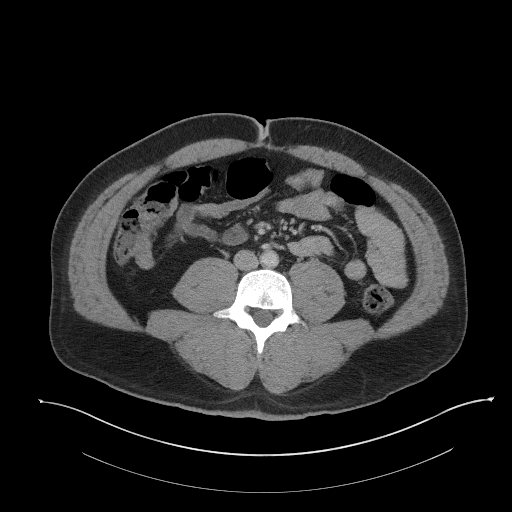
[im 68/116  soft-tissue]
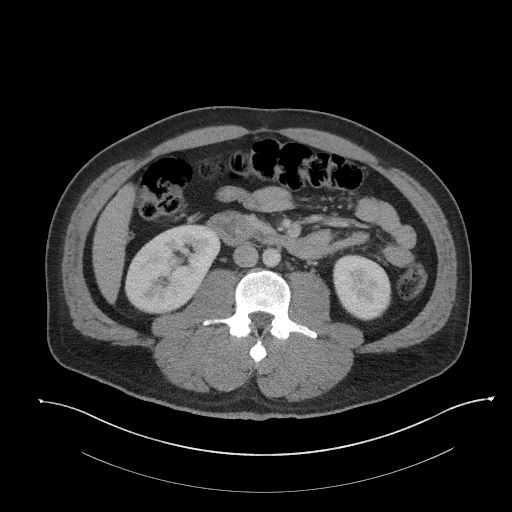
[im 77/116  soft-tissue]
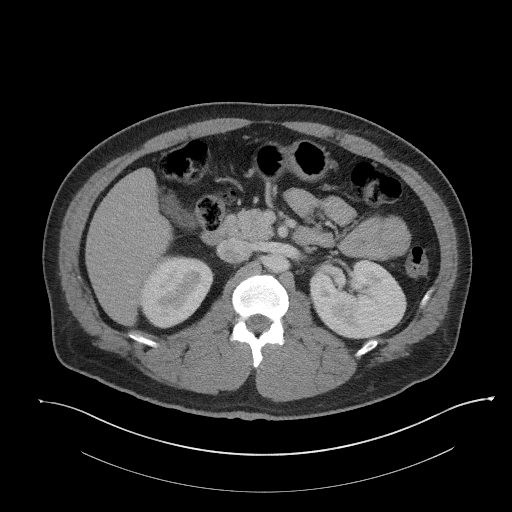
[im 77/116  bone]
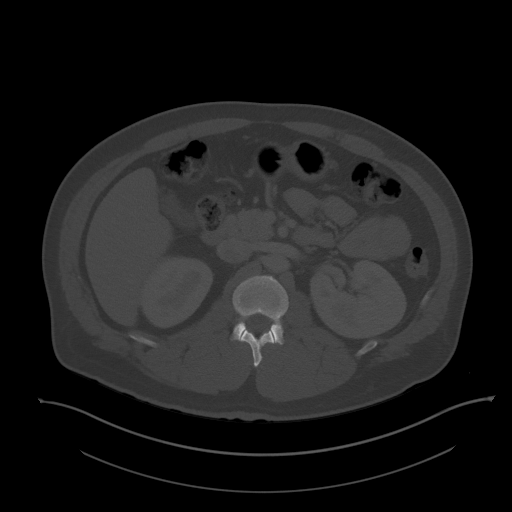
[im 82/116  soft-tissue]
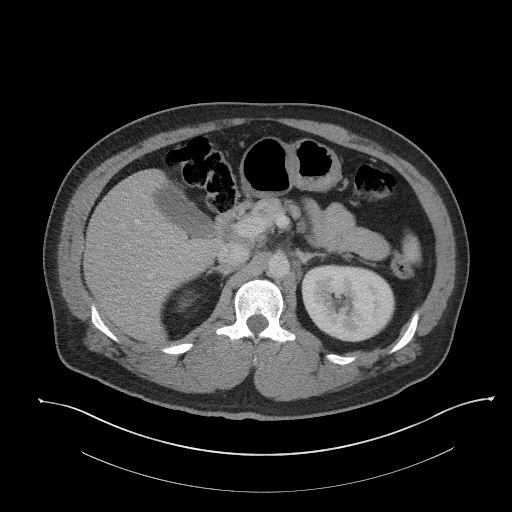
[im 92/116  soft-tissue]
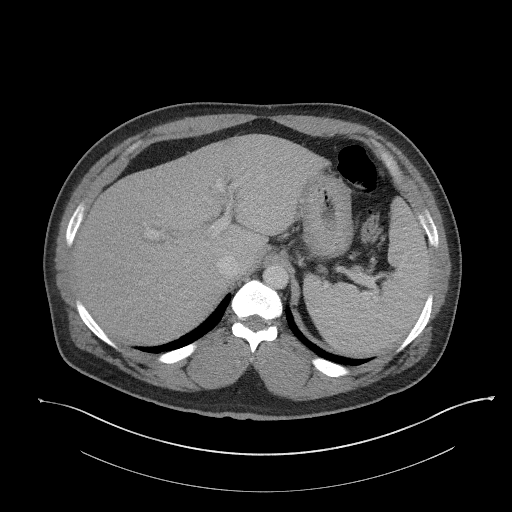
[im 101/116  soft-tissue]
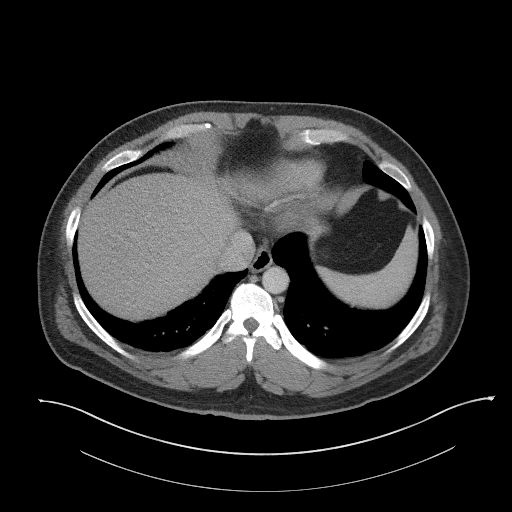
[im 111/116  soft-tissue]
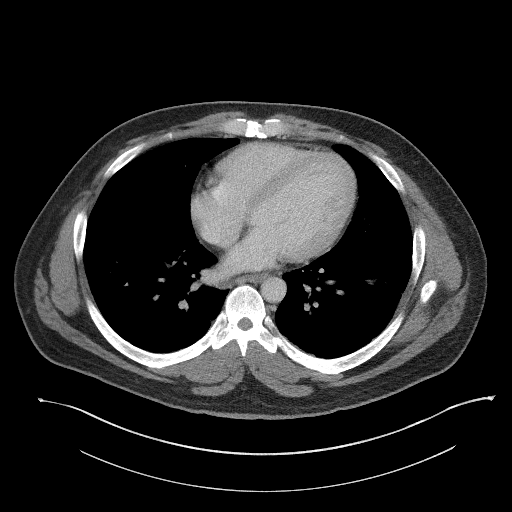

[Series 5: coronal st · coronal · 0.96mm/px · 3 of 106 slices shown]
[im 36/106  soft-tissue]
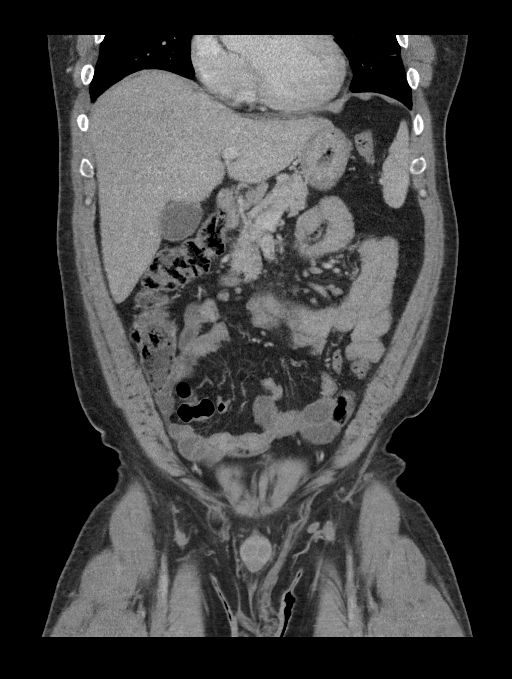
[im 47/106  soft-tissue]
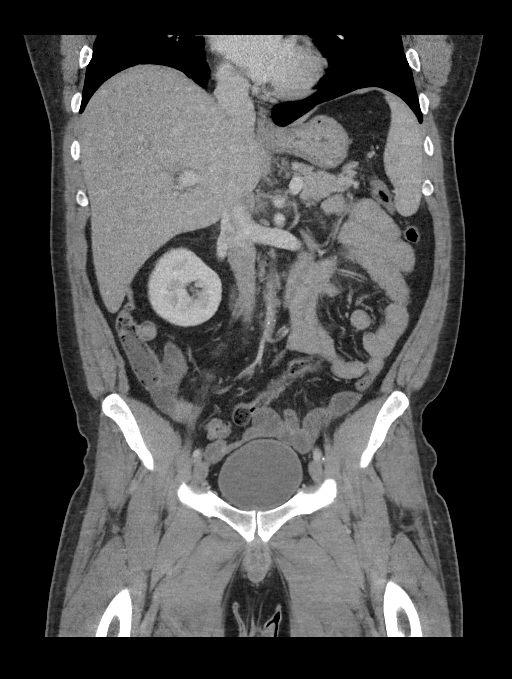
[im 59/106  soft-tissue]
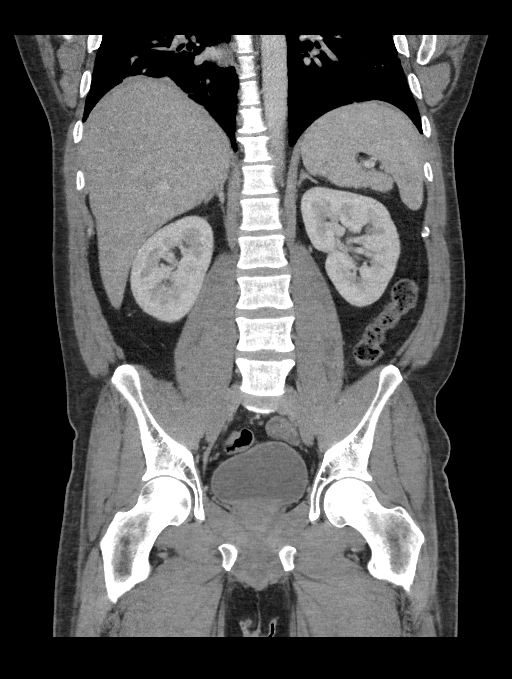

[16 of 46 positions shown; findings below may reference images not displayed]

FINDINGS: Lower chest: Linear opacities of the bilateral lower lungs, likely
due to scarring or atelectasis.

Hepatobiliary: No focal liver abnormality is seen. No gallstones,
gallbladder wall thickening, or biliary dilatation.

Pancreas: Unremarkable. No pancreatic ductal dilatation or
surrounding inflammatory changes.

Spleen: Normal in size without focal abnormality.

Adrenals/Urinary Tract: Adrenal glands are unremarkable. Kidneys are
normal, without renal calculi, focal lesion, or hydronephrosis.
Bladder is unremarkable.

Stomach/Bowel: Stomach is within normal limits. Appendix appears
normal. No evidence of bowel wall thickening, distention, or
inflammatory changes.

Vascular/Lymphatic: Aortic atherosclerosis. No enlarged abdominal or
pelvic lymph nodes.

Reproductive: Prostate is unremarkable.

Other: No abdominal wall hernia or abnormality. No abdominopelvic
ascites.

Musculoskeletal: No acute or significant osseous findings.
IMPRESSION: No acute findings in the abdomen or pelvis.

Aortic Atherosclerosis ([id number]).

## 2022-03-18 NOTE — Progress Notes (Unsigned)
There were no vitals taken for this visit.   Subjective:    Patient ID: Steve [name], male    DOB: 04/17/1981, 41 y.o.   MRN: [id number]  HPI: Steve [name] is a 41 y.o. male  No chief complaint on file.  HYPERTENSION / HYPERLIPIDEMIA Satisfied with current treatment? no Duration of hypertension: years BP monitoring frequency: not checking BP range:  BP medication side effects: no Past BP meds: none Duration of hyperlipidemia: years Cholesterol medication side effects: no Cholesterol supplements: none Past cholesterol medications: none Medication compliance: excellent compliance Aspirin: no Recent stressors: no Recurrent headaches: no Visual changes: no Palpitations: no Dyspnea: no Chest pain: no Lower extremity edema: no Dizzy/lightheaded: no  DIABETES Hypoglycemic episodes:no Polydipsia/polyuria: no Visual disturbance: no Chest pain: no Paresthesias: no Glucose Monitoring: no  Accucheck frequency: Not Checking  Fasting glucose:  Post prandial:  Evening:  Before meals: Taking Insulin?: no  Long acting insulin:  Short acting insulin: Blood Pressure Monitoring: not checking Retinal Examination: Not up to Date Foot Exam: Up to Date Diabetic Education: Not Completed Pneumovax: Not up to Date Influenza: Not up to Date Aspirin: no    Relevant past medical, surgical, family and social history reviewed and updated as indicated. Interim medical history since our last visit reviewed. Allergies and medications reviewed and updated.  Review of Systems  Eyes:  Negative for visual disturbance.  Respiratory:  Negative for chest tightness and shortness of breath.   Cardiovascular:  Negative for chest pain, palpitations and leg swelling.  Endocrine: Negative for polydipsia and polyuria.  Musculoskeletal:        Left thumb pain  Neurological:  Negative for dizziness, light-headedness, numbness and headaches.    Per HPI unless specifically indicated  above     Objective:    There were no vitals taken for this visit.  Wt Readings from Last 3 Encounters:  02/14/22 248 lb 4.8 oz (112.6 kg)  03/21/21 247 lb (112 kg)  03/14/21 245 lb (111.1 kg)    Physical Exam Vitals and nursing note reviewed.  Constitutional:      General: He is not in acute distress.    Appearance: Normal appearance. He is not ill-appearing, toxic-appearing or diaphoretic.  HENT:     Head: Normocephalic.     Right Ear: External ear normal.     Left Ear: External ear normal.     Nose: Nose normal. No congestion or rhinorrhea.     Mouth/Throat:     Mouth: Mucous membranes are moist.  Eyes:     General:        Right eye: No discharge.        Left eye: No discharge.     Extraocular Movements: Extraocular movements intact.     Conjunctiva/sclera: Conjunctivae normal.     Pupils: Pupils are equal, round, and reactive to light.  Cardiovascular:     Rate and Rhythm: Normal rate and regular rhythm.     Heart sounds: No murmur heard. Pulmonary:     Effort: Pulmonary effort is normal. No respiratory distress.     Breath sounds: Normal breath sounds. No wheezing, rhonchi or rales.  Abdominal:     General: Abdomen is flat. Bowel sounds are normal.  Musculoskeletal:       Arms:     Cervical back: Normal range of motion and neck supple.  Skin:    General: Skin is warm and dry.     Capillary Refill: Capillary refill takes less than 2 seconds.  Neurological:     General: No focal deficit present.     Mental Status: He is alert and oriented to person, place, and time.  Psychiatric:        Mood and Affect: Mood normal.        Behavior: Behavior normal.        Thought Content: Thought content normal.        Judgment: Judgment normal.    Results for orders placed or performed in visit on 02/14/22  Comp Met (CMET)  Result Value Ref Range   Glucose 181 (H) 70 - 99 mg/dL   BUN 7 6 - 24 mg/dL   Creatinine, Ser 0.82 0.76 - 1.27 mg/dL   eGFR 114 >59 mL/min/1.73    BUN/Creatinine Ratio 9 9 - 20   Sodium 139 134 - 144 mmol/L   Potassium 4.1 3.5 - 5.2 mmol/L   Chloride 101 96 - 106 mmol/L   CO2 25 20 - 29 mmol/L   Calcium 10.0 8.7 - 10.2 mg/dL   Total Protein 6.9 6.0 - 8.5 g/dL   Albumin 4.3 4.1 - 5.1 g/dL   Globulin, Total 2.6 1.5 - 4.5 g/dL   Albumin/Globulin Ratio 1.7 1.2 - 2.2   Bilirubin Total 0.7 0.0 - 1.2 mg/dL   Alkaline Phosphatase 73 44 - 121 IU/L   AST 18 0 - 40 IU/L   ALT 25 0 - 44 IU/L  Lipid Profile  Result Value Ref Range   Cholesterol, Total 259 (H) 100 - 199 mg/dL   Triglycerides 367 (H) 0 - 149 mg/dL   HDL 30 (L) >39 mg/dL   VLDL Cholesterol Cal 70 (H) 5 - 40 mg/dL   LDL Chol Calc (NIH) 159 (H) 0 - 99 mg/dL   Chol/HDL Ratio 8.6 (H) 0.0 - 5.0 ratio  HgB A1c  Result Value Ref Range   Hgb A1c MFr Bld 10.7 (H) 4.8 - 5.6 %   Est. average glucose Bld gHb Est-mCnc 260 mg/dL  Microalbumin, Urine Waived  Result Value Ref Range   Microalb, Ur Waived 10 0 - 19 mg/L   Creatinine, Urine Waived 100 10 - 300 mg/dL   Microalb/Creat Ratio <30 <30 mg/g      Assessment & Plan:   Problem List Items Addressed This Visit       Cardiovascular and Mediastinum   Hypertension associated with diabetes ([place]) - Primary     Endocrine   Type 2 diabetes mellitus with hyperglycemia, without long-term current use of insulin (HCC)     Follow up plan: No follow-ups on file.

## 2022-03-19 ENCOUNTER — Ambulatory Visit (INDEPENDENT_AMBULATORY_CARE_PROVIDER_SITE_OTHER): Payer: Medicare Other | Admitting: Nurse Practitioner

## 2022-03-19 ENCOUNTER — Encounter: Payer: Self-pay | Admitting: Nurse Practitioner

## 2022-03-19 VITALS — BP 140/88 | HR 87 | Temp 97.4°F | Wt 247.5 lb

## 2022-03-19 DIAGNOSIS — E1159 Type 2 diabetes mellitus with other circulatory complications: Secondary | ICD-10-CM | POA: Diagnosis not present

## 2022-03-19 DIAGNOSIS — E1165 Type 2 diabetes mellitus with hyperglycemia: Secondary | ICD-10-CM | POA: Diagnosis not present

## 2022-03-19 DIAGNOSIS — I152 Hypertension secondary to endocrine disorders: Secondary | ICD-10-CM | POA: Diagnosis not present

## 2022-03-19 NOTE — Assessment & Plan Note (Signed)
Chronic. Not well controlled.  A1c of 10.7 in Septmber.  Is taking Wilder Glade and has changed his diet.  Would like to work on diet further before adding additional medication.  Counseled on diet changes at visit today.  Follow up in 2 months for repeat labs.

## 2022-03-19 NOTE — Assessment & Plan Note (Signed)
Chronic. Not well controlled.  Patient would like to work on diet before starting blood pressure medication.  Discussed increased risk of cardiac event with uncontrolled hypertension.  Will follow up in 2 months.  Call sooner if concerns arise.

## 2022-05-16 ENCOUNTER — Ambulatory Visit: Payer: Medicare Other | Admitting: Nurse Practitioner

## 2022-05-16 NOTE — Progress Notes (Signed)
BP 117/79   Pulse 87   Temp 97.6 F (36.4 C) (Oral)   Wt 245 lb 4.8 oz (111.3 kg)   SpO2 98%   BMI 32.36 kg/m    Subjective:    Patient ID: Steve [name], male    DOB: Sep 01, 1980, 41 y.o.   MRN: [id number]  HPI: Steve [name] is a 41 y.o. male  Chief Complaint  Patient presents with   Diabetes   Hyperlipidemia   Hypertension   HYPERTENSION / HYPERLIPIDEMIA Satisfied with current treatment? no Duration of hypertension: years BP monitoring frequency: not checking BP range:  BP medication side effects: no Past BP meds: none Duration of hyperlipidemia: years Cholesterol medication side effects: no Cholesterol supplements: none Past cholesterol medications:  Medication compliance: excellent compliance Aspirin: no Recent stressors: no Recurrent headaches: no Visual changes: no Palpitations: no Dyspnea: no Chest pain: no Lower extremity edema: no Dizzy/lightheaded: no  DIABETES Taking Trulicity and [place]. Hypoglycemic episodes:no Polydipsia/polyuria: no Visual disturbance: no Chest pain: no Paresthesias: no Glucose Monitoring: no  Accucheck frequency: Not Checking  Fasting glucose:  Post prandial:  Evening:  Before meals: Taking Insulin?: no  Long acting insulin:  Short acting insulin: Blood Pressure Monitoring: not checking Retinal Examination: Not up to Date Foot Exam: Up to Date Diabetic Education: Not Completed Pneumovax: Not up to Date Influenza: Not up to Date Aspirin: no    Relevant past medical, surgical, family and social history reviewed and updated as indicated. Interim medical history since our last visit reviewed. Allergies and medications reviewed and updated.  Review of Systems  Eyes:  Negative for visual disturbance.  Respiratory:  Negative for chest tightness and shortness of breath.   Cardiovascular:  Negative for chest pain, palpitations and leg swelling.  Endocrine: Negative for polydipsia and polyuria.   Musculoskeletal:        Left thumb pain  Neurological:  Negative for dizziness, light-headedness, numbness and headaches.    Per HPI unless specifically indicated above     Objective:    BP 117/79   Pulse 87   Temp 97.6 F (36.4 C) (Oral)   Wt 245 lb 4.8 oz (111.3 kg)   SpO2 98%   BMI 32.36 kg/m   Wt Readings from Last 3 Encounters:  05/20/22 245 lb 4.8 oz (111.3 kg)  03/19/22 247 lb 8 oz (112.3 kg)  02/14/22 248 lb 4.8 oz (112.6 kg)    Physical Exam Vitals and nursing note reviewed.  Constitutional:      General: He is not in acute distress.    Appearance: Normal appearance. He is not ill-appearing, toxic-appearing or diaphoretic.  HENT:     Head: Normocephalic.     Right Ear: External ear normal.     Left Ear: External ear normal.     Nose: Nose normal. No congestion or rhinorrhea.     Mouth/Throat:     Mouth: Mucous membranes are moist.  Eyes:     General:        Right eye: No discharge.        Left eye: No discharge.     Extraocular Movements: Extraocular movements intact.     Conjunctiva/sclera: Conjunctivae normal.     Pupils: Pupils are equal, round, and reactive to light.  Cardiovascular:     Rate and Rhythm: Normal rate and regular rhythm.     Heart sounds: No murmur heard. Pulmonary:     Effort: Pulmonary effort is normal. No respiratory distress.     Breath  sounds: Normal breath sounds. No wheezing, rhonchi or rales.  Abdominal:     General: Abdomen is flat. Bowel sounds are normal.  Musculoskeletal:     Cervical back: Normal range of motion and neck supple.  Skin:    General: Skin is warm and dry.     Capillary Refill: Capillary refill takes less than 2 seconds.  Neurological:     General: No focal deficit present.     Mental Status: He is alert and oriented to person, place, and time.  Psychiatric:        Mood and Affect: Mood normal.        Behavior: Behavior normal.        Thought Content: Thought content normal.        Judgment: Judgment  normal.     Results for orders placed or performed in visit on 02/14/22  Comp Met (CMET)  Result Value Ref Range   Glucose 181 (H) 70 - 99 mg/dL   BUN 7 6 - 24 mg/dL   Creatinine, Ser 0.82 0.76 - 1.27 mg/dL   eGFR 114 >59 mL/min/1.73   BUN/Creatinine Ratio 9 9 - 20   Sodium 139 134 - 144 mmol/L   Potassium 4.1 3.5 - 5.2 mmol/L   Chloride 101 96 - 106 mmol/L   CO2 25 20 - 29 mmol/L   Calcium 10.0 8.7 - 10.2 mg/dL   Total Protein 6.9 6.0 - 8.5 g/dL   Albumin 4.3 4.1 - 5.1 g/dL   Globulin, Total 2.6 1.5 - 4.5 g/dL   Albumin/Globulin Ratio 1.7 1.2 - 2.2   Bilirubin Total 0.7 0.0 - 1.2 mg/dL   Alkaline Phosphatase 73 44 - 121 IU/L   AST 18 0 - 40 IU/L   ALT 25 0 - 44 IU/L  Lipid Profile  Result Value Ref Range   Cholesterol, Total 259 (H) 100 - 199 mg/dL   Triglycerides 367 (H) 0 - 149 mg/dL   HDL 30 (L) >39 mg/dL   VLDL Cholesterol Cal 70 (H) 5 - 40 mg/dL   LDL Chol Calc (NIH) 159 (H) 0 - 99 mg/dL   Chol/HDL Ratio 8.6 (H) 0.0 - 5.0 ratio  HgB A1c  Result Value Ref Range   Hgb A1c MFr Bld 10.7 (H) 4.8 - 5.6 %   Est. average glucose Bld gHb Est-mCnc 260 mg/dL  Microalbumin, Urine Waived  Result Value Ref Range   Microalb, Ur Waived 10 0 - 19 mg/L   Creatinine, Urine Waived 100 10 - 300 mg/dL   Microalb/Creat Ratio <30 <30 mg/g      Assessment & Plan:   Problem List Items Addressed This Visit       Cardiovascular and Mediastinum   Hypertension associated with diabetes (HCC)    Chronic. Improved.  Will follow up in 3 months.  Call sooner if concerns arise.      Relevant Medications   Dulaglutide (TRULICITY) 1.5 DD/2.2GU SOPN   Atherosclerosis of aorta (HCC)    Chronic.  Controlled.  Continue with current medication regimen of Crestor 46m.  Labs ordered today.  Return to clinic in 3 months for reevaluation.  Call sooner if concerns arise.          Endocrine   Type 2 diabetes mellitus with hyperglycemia, without long-term current use of insulin (HCC) - Primary     Chronic. Not well controlled.  A1c of 10.7 in Septmber.  Is taking FWilder Gladeand has changed his diet.  Also taking Trulicity.  Would  like to work on diet further before adding additional medication.  Counseled on diet changes at visit today.  Follow up in 3 months.  Call sooner if concerns arise.       Relevant Medications   Dulaglutide (TRULICITY) 1.5 EJ/6.1TE SOPN   Other Relevant Orders   Comp Met (CMET)   HgB A1c   Hyperlipidemia associated with type 2 diabetes mellitus (HCC)    Chronic.  Controlled.  Continue with current medication regimen of Crestor.  Labs ordered today.  Return to clinic in 3 months for reevaluation.  Call sooner if concerns arise.        Relevant Medications   Dulaglutide (TRULICITY) 1.5 IH/5.3PN SOPN     Follow up plan: Return in about 3 months (around 08/19/2022) for HTN, HLD, DM2 FU.

## 2022-05-20 ENCOUNTER — Ambulatory Visit (INDEPENDENT_AMBULATORY_CARE_PROVIDER_SITE_OTHER): Payer: Medicare Other | Admitting: Nurse Practitioner

## 2022-05-20 ENCOUNTER — Encounter: Payer: Self-pay | Admitting: Nurse Practitioner

## 2022-05-20 VITALS — BP 117/79 | HR 87 | Temp 97.6°F | Wt 245.3 lb

## 2022-05-20 DIAGNOSIS — I700 Atherosclerosis of aorta: Secondary | ICD-10-CM | POA: Diagnosis not present

## 2022-05-20 DIAGNOSIS — I152 Hypertension secondary to endocrine disorders: Secondary | ICD-10-CM | POA: Diagnosis not present

## 2022-05-20 DIAGNOSIS — E1165 Type 2 diabetes mellitus with hyperglycemia: Secondary | ICD-10-CM | POA: Diagnosis not present

## 2022-05-20 DIAGNOSIS — E1159 Type 2 diabetes mellitus with other circulatory complications: Secondary | ICD-10-CM

## 2022-05-20 DIAGNOSIS — E785 Hyperlipidemia, unspecified: Secondary | ICD-10-CM | POA: Diagnosis not present

## 2022-05-20 DIAGNOSIS — E1169 Type 2 diabetes mellitus with other specified complication: Secondary | ICD-10-CM | POA: Diagnosis not present

## 2022-05-20 MED ORDER — TRULICITY 1.5 MG/0.5ML ~~LOC~~ SOAJ: 1.5000 mg | SUBCUTANEOUS | 1 refills | Status: DC

## 2022-05-20 NOTE — Assessment & Plan Note (Signed)
Chronic.  Controlled.  Continue with current medication regimen of Crestor.  Labs ordered today.  Return to clinic in 3 months for reevaluation.  Call sooner if concerns arise.

## 2022-05-20 NOTE — Assessment & Plan Note (Signed)
Chronic. Improved.  Will follow up in 3 months.  Call sooner if concerns arise.

## 2022-05-20 NOTE — Assessment & Plan Note (Signed)
Chronic.  Controlled.  Continue with current medication regimen of Crestor '5mg'$ .  Labs ordered today.  Return to clinic in 3 months for reevaluation.  Call sooner if concerns arise.

## 2022-05-20 NOTE — Assessment & Plan Note (Signed)
Chronic. Not well controlled.  A1c of 10.7 in Septmber.  Is taking Wilder Glade and has changed his diet.  Also taking Trulicity.  Would like to work on diet further before adding additional medication.  Counseled on diet changes at visit today.  Follow up in 3 months.  Call sooner if concerns arise.

## 2022-05-21 LAB — COMPREHENSIVE METABOLIC PANEL
ALT: 23 IU/L (ref 0–44)
AST: 19 IU/L (ref 0–40)
Albumin/Globulin Ratio: 1.8 (ref 1.2–2.2)
Albumin: 4.8 g/dL (ref 4.1–5.1)
Alkaline Phosphatase: 82 IU/L (ref 44–121)
BUN/Creatinine Ratio: 12 (ref 9–20)
BUN: 11 mg/dL (ref 6–24)
Bilirubin Total: 0.8 mg/dL (ref 0.0–1.2)
CO2: 23 mmol/L (ref 20–29)
Calcium: 9.7 mg/dL (ref 8.7–10.2)
Chloride: 99 mmol/L (ref 96–106)
Creatinine, Ser: 0.92 mg/dL (ref 0.76–1.27)
Globulin, Total: 2.6 g/dL (ref 1.5–4.5)
Glucose: 227 mg/dL — ABNORMAL HIGH (ref 70–99)
Potassium: 4.4 mmol/L (ref 3.5–5.2)
Sodium: 137 mmol/L (ref 134–144)
Total Protein: 7.4 g/dL (ref 6.0–8.5)
eGFR: 107 mL/min/{1.73_m2} (ref >59)

## 2022-05-21 LAB — HEMOGLOBIN A1C
Est. average glucose Bld gHb Est-mCnc: 283 mg/dL
Hgb A1c MFr Bld: 11.5 % — ABNORMAL HIGH (ref 4.8–5.6)

## 2022-05-21 MED ORDER — OZEMPIC (0.25 OR 0.5 MG/DOSE) 2 MG/1.5ML ~~LOC~~ SOPN: 0.2500 mg | PEN_INJECTOR | SUBCUTANEOUS | 0 refills | Status: DC

## 2022-05-21 NOTE — Progress Notes (Signed)
Please let patient know that his lab work shows that his A1c increased to 11.5.  I recommend changing from trulicity to [place].  It is a similar medication that is injected once weekly.  However, it has been ability to control his A1c.  I would like him to come back in one month so we can increase the dose.  Please let him know that it is extremely important to change his diet otherwise, our next step will be insulin.  If he agrees to the Ozempic, I will send it in.

## 2022-05-21 NOTE — Progress Notes (Signed)
Ozempic sent to the pharmacy.

## 2022-05-21 NOTE — Addendum Note (Signed)
Addended by: Jon Billings on: 05/21/2022 11:42 AM   Modules accepted: Orders

## 2022-06-24 NOTE — Progress Notes (Deleted)
There were no vitals taken for this visit.   Subjective:    Patient ID: Steve [name], male    DOB: 1980-10-20, 42 y.o.   MRN: [id number]  HPI: Steve [name] is a 42 y.o. male  No chief complaint on file.  HYPERTENSION / HYPERLIPIDEMIA Satisfied with current treatment? no Duration of hypertension: years BP monitoring frequency: not checking BP range:  BP medication side effects: no Past BP meds: none Duration of hyperlipidemia: years Cholesterol medication side effects: no Cholesterol supplements: none Past cholesterol medications:  Medication compliance: excellent compliance Aspirin: no Recent stressors: no Recurrent headaches: no Visual changes: no Palpitations: no Dyspnea: no Chest pain: no Lower extremity edema: no Dizzy/lightheaded: no  DIABETES Taking Trulicity and [place]. Hypoglycemic episodes:no Polydipsia/polyuria: no Visual disturbance: no Chest pain: no Paresthesias: no Glucose Monitoring: no  Accucheck frequency: Not Checking  Fasting glucose:  Post prandial:  Evening:  Before meals: Taking Insulin?: no  Long acting insulin:  Short acting insulin: Blood Pressure Monitoring: not checking Retinal Examination: Not up to Date Foot Exam: Up to Date Diabetic Education: Not Completed Pneumovax: Not up to Date Influenza: Not up to Date Aspirin: no    Relevant past medical, surgical, family and social history reviewed and updated as indicated. Interim medical history since our last visit reviewed. Allergies and medications reviewed and updated.  Review of Systems  Eyes:  Negative for visual disturbance.  Respiratory:  Negative for chest tightness and shortness of breath.   Cardiovascular:  Negative for chest pain, palpitations and leg swelling.  Endocrine: Negative for polydipsia and polyuria.  Musculoskeletal:        Left thumb pain  Neurological:  Negative for dizziness, light-headedness, numbness and headaches.    Per HPI unless  specifically indicated above     Objective:    There were no vitals taken for this visit.  Wt Readings from Last 3 Encounters:  05/20/22 245 lb 4.8 oz (111.3 kg)  03/19/22 247 lb 8 oz (112.3 kg)  02/14/22 248 lb 4.8 oz (112.6 kg)    Physical Exam Vitals and nursing note reviewed.  Constitutional:      General: He is not in acute distress.    Appearance: Normal appearance. He is not ill-appearing, toxic-appearing or diaphoretic.  HENT:     Head: Normocephalic.     Right Ear: External ear normal.     Left Ear: External ear normal.     Nose: Nose normal. No congestion or rhinorrhea.     Mouth/Throat:     Mouth: Mucous membranes are moist.  Eyes:     General:        Right eye: No discharge.        Left eye: No discharge.     Extraocular Movements: Extraocular movements intact.     Conjunctiva/sclera: Conjunctivae normal.     Pupils: Pupils are equal, round, and reactive to light.  Cardiovascular:     Rate and Rhythm: Normal rate and regular rhythm.     Heart sounds: No murmur heard. Pulmonary:     Effort: Pulmonary effort is normal. No respiratory distress.     Breath sounds: Normal breath sounds. No wheezing, rhonchi or rales.  Abdominal:     General: Abdomen is flat. Bowel sounds are normal.  Musculoskeletal:     Cervical back: Normal range of motion and neck supple.  Skin:    General: Skin is warm and dry.     Capillary Refill: Capillary refill takes less than 2  seconds.  Neurological:     General: No focal deficit present.     Mental Status: He is alert and oriented to person, place, and time.  Psychiatric:        Mood and Affect: Mood normal.        Behavior: Behavior normal.        Thought Content: Thought content normal.        Judgment: Judgment normal.    Results for orders placed or performed in visit on 05/20/22  Comp Met (CMET)  Result Value Ref Range   Glucose 227 (H) 70 - 99 mg/dL   BUN 11 6 - 24 mg/dL   Creatinine, Ser 0.92 0.76 - 1.27 mg/dL    eGFR 107 >59 mL/min/1.73   BUN/Creatinine Ratio 12 9 - 20   Sodium 137 134 - 144 mmol/L   Potassium 4.4 3.5 - 5.2 mmol/L   Chloride 99 96 - 106 mmol/L   CO2 23 20 - 29 mmol/L   Calcium 9.7 8.7 - 10.2 mg/dL   Total Protein 7.4 6.0 - 8.5 g/dL   Albumin 4.8 4.1 - 5.1 g/dL   Globulin, Total 2.6 1.5 - 4.5 g/dL   Albumin/Globulin Ratio 1.8 1.2 - 2.2   Bilirubin Total 0.8 0.0 - 1.2 mg/dL   Alkaline Phosphatase 82 44 - 121 IU/L   AST 19 0 - 40 IU/L   ALT 23 0 - 44 IU/L  HgB A1c  Result Value Ref Range   Hgb A1c MFr Bld 11.5 (H) 4.8 - 5.6 %   Est. average glucose Bld gHb Est-mCnc 283 mg/dL      Assessment & Plan:   Problem List Items Addressed This Visit   None    Follow up plan: No follow-ups on file.

## 2022-06-25 ENCOUNTER — Ambulatory Visit: Payer: 59 | Admitting: Nurse Practitioner

## 2022-08-19 ENCOUNTER — Ambulatory Visit: Payer: 59 | Admitting: Nurse Practitioner

## 2022-08-19 NOTE — Progress Notes (Deleted)
There were no vitals taken for this visit.   Subjective:    Patient ID: Steve [name], male    DOB: 1980-10-20, 42 y.o.   MRN: [id number]  HPI: SALBADOR [name] is a 42 y.o. male  No chief complaint on file.  HYPERTENSION / HYPERLIPIDEMIA Satisfied with current treatment? no Duration of hypertension: years BP monitoring frequency: not checking BP range:  BP medication side effects: no Past BP meds: none Duration of hyperlipidemia: years Cholesterol medication side effects: no Cholesterol supplements: none Past cholesterol medications:  Medication compliance: excellent compliance Aspirin: no Recent stressors: no Recurrent headaches: no Visual changes: no Palpitations: no Dyspnea: no Chest pain: no Lower extremity edema: no Dizzy/lightheaded: no  DIABETES Taking Trulicity and [place]. Hypoglycemic episodes:no Polydipsia/polyuria: no Visual disturbance: no Chest pain: no Paresthesias: no Glucose Monitoring: no  Accucheck frequency: Not Checking  Fasting glucose:  Post prandial:  Evening:  Before meals: Taking Insulin?: no  Long acting insulin:  Short acting insulin: Blood Pressure Monitoring: not checking Retinal Examination: Not up to Date Foot Exam: Up to Date Diabetic Education: Not Completed Pneumovax: Not up to Date Influenza: Not up to Date Aspirin: no    Relevant past medical, surgical, family and social history reviewed and updated as indicated. Interim medical history since our last visit reviewed. Allergies and medications reviewed and updated.  Review of Systems  Eyes:  Negative for visual disturbance.  Respiratory:  Negative for chest tightness and shortness of breath.   Cardiovascular:  Negative for chest pain, palpitations and leg swelling.  Endocrine: Negative for polydipsia and polyuria.  Musculoskeletal:        Left thumb pain  Neurological:  Negative for dizziness, light-headedness, numbness and headaches.    Per HPI unless  specifically indicated above     Objective:    There were no vitals taken for this visit.  Wt Readings from Last 3 Encounters:  05/20/22 245 lb 4.8 oz (111.3 kg)  03/19/22 247 lb 8 oz (112.3 kg)  02/14/22 248 lb 4.8 oz (112.6 kg)    Physical Exam Vitals and nursing note reviewed.  Constitutional:      General: He is not in acute distress.    Appearance: Normal appearance. He is not ill-appearing, toxic-appearing or diaphoretic.  HENT:     Head: Normocephalic.     Right Ear: External ear normal.     Left Ear: External ear normal.     Nose: Nose normal. No congestion or rhinorrhea.     Mouth/Throat:     Mouth: Mucous membranes are moist.  Eyes:     General:        Right eye: No discharge.        Left eye: No discharge.     Extraocular Movements: Extraocular movements intact.     Conjunctiva/sclera: Conjunctivae normal.     Pupils: Pupils are equal, round, and reactive to light.  Cardiovascular:     Rate and Rhythm: Normal rate and regular rhythm.     Heart sounds: No murmur heard. Pulmonary:     Effort: Pulmonary effort is normal. No respiratory distress.     Breath sounds: Normal breath sounds. No wheezing, rhonchi or rales.  Abdominal:     General: Abdomen is flat. Bowel sounds are normal.  Musculoskeletal:     Cervical back: Normal range of motion and neck supple.  Skin:    General: Skin is warm and dry.     Capillary Refill: Capillary refill takes less than 2  seconds.  Neurological:     General: No focal deficit present.     Mental Status: He is alert and oriented to person, place, and time.  Psychiatric:        Mood and Affect: Mood normal.        Behavior: Behavior normal.        Thought Content: Thought content normal.        Judgment: Judgment normal.    Results for orders placed or performed in visit on 05/20/22  Comp Met (CMET)  Result Value Ref Range   Glucose 227 (H) 70 - 99 mg/dL   BUN 11 6 - 24 mg/dL   Creatinine, Ser 0.92 0.76 - 1.27 mg/dL    eGFR 107 >59 mL/min/1.73   BUN/Creatinine Ratio 12 9 - 20   Sodium 137 134 - 144 mmol/L   Potassium 4.4 3.5 - 5.2 mmol/L   Chloride 99 96 - 106 mmol/L   CO2 23 20 - 29 mmol/L   Calcium 9.7 8.7 - 10.2 mg/dL   Total Protein 7.4 6.0 - 8.5 g/dL   Albumin 4.8 4.1 - 5.1 g/dL   Globulin, Total 2.6 1.5 - 4.5 g/dL   Albumin/Globulin Ratio 1.8 1.2 - 2.2   Bilirubin Total 0.8 0.0 - 1.2 mg/dL   Alkaline Phosphatase 82 44 - 121 IU/L   AST 19 0 - 40 IU/L   ALT 23 0 - 44 IU/L  HgB A1c  Result Value Ref Range   Hgb A1c MFr Bld 11.5 (H) 4.8 - 5.6 %   Est. average glucose Bld gHb Est-mCnc 283 mg/dL      Assessment & Plan:   Problem List Items Addressed This Visit       Cardiovascular and Mediastinum   Hypertension associated with diabetes ([place])   Atherosclerosis of aorta ([place]) - Primary     Endocrine   Type 2 diabetes mellitus with hyperglycemia, without long-term current use of insulin (HCC)   Hyperlipidemia associated with type 2 diabetes mellitus ([place])     Follow up plan: No follow-ups on file.

## 2022-09-13 ENCOUNTER — Encounter: Payer: Self-pay | Admitting: Nurse Practitioner

## 2022-09-17 ENCOUNTER — Telehealth: Payer: Self-pay

## 2022-09-17 ENCOUNTER — Other Ambulatory Visit: Payer: Medicare Other

## 2022-09-17 NOTE — Progress Notes (Signed)
   09/17/2022  Patient ID: Steve [name], male   DOB: 1980/11/03, 42 y.o.   MRN: [id number]  Subjective/Objective: Telephone outreach after patient appeared on quality report identifying failed 2023 metrics for adherence to oral diabetes medications, A1c control, and statin use in diabetes per insurance.  Initial outreach was successful, but we were not able to complete conversation, and patient requested call at 4pm.  Unable to reach him at that time but left a message with my direct number for him to call at his convenience.  DM -Upon reviewing medication list, patient stated he needed a refill of Trulicity because he did not know how to use Ozempic.   -He also endorsed not understanding Ozempic would work to control DM and could lead to weight loss similar to Trulicity  Assessment/Plan: -Informed patient that with Ozempic, it is a mulit-use pen and the dose must be dialed and needle attached to inject -Plan was to further educate on Ozempic use and complete medication review and adherence discussion at 4pm  Follow-up:  Will attempt outreach again next week if I have not heard back from patient  Lenna Gilford, PharmD, DPLA

## 2022-09-24 ENCOUNTER — Telehealth: Payer: Self-pay

## 2022-09-24 NOTE — Progress Notes (Signed)
   09/24/2022  Patient ID: Steve [name], male   DOB: 02-02-1981, 42 y.o.   MRN: [id number]  Outreach attempt to complete conversation initiated last week in regard to Ozempic use and medication adherence.  Unable to reach patient, but left a message with my direct number for him to call at his convenience.  Lenna Gilford, PharmD, DPLA

## 2022-10-20 ENCOUNTER — Other Ambulatory Visit: Payer: Self-pay | Admitting: Nurse Practitioner

## 2022-10-22 NOTE — Telephone Encounter (Signed)
Requested Prescriptions  Pending Prescriptions Disp Refills   dexlansoprazole (DEXILANT) 60 MG capsule [Pharmacy Med Name: DEXLANSOPRAZOLE DR 60 MG CAP] 90 capsule 2    Sig: TAKE 1 CAPSULE BY MOUTH EVERY DAY     Gastroenterology: Proton Pump Inhibitors Passed - 10/20/2022  8:36 AM      Passed - Valid encounter within last 12 months    Recent Outpatient Visits           5 months ago Type 2 diabetes mellitus with hyperglycemia, without long-term current use of insulin (HCC)   [place] [facility] Larae Grooms, NP   7 months ago Hypertension associated with diabetes [facility])   [place] [facility] Larae Grooms, NP   8 months ago Type 2 diabetes mellitus with hyperglycemia, without long-term current use of insulin [facility])   [place] [facility] Larae Grooms, NP   1 year ago Hypertension associated with diabetes [facility])   [place] [facility] Larae Grooms, NP   1 year ago Hypertension associated with diabetes [facility])    [facility] Larae Grooms, NP

## 2022-11-11 ENCOUNTER — Telehealth: Payer: Self-pay

## 2022-11-11 NOTE — Telephone Encounter (Signed)
Follow up appointment has been made

## 2022-11-11 NOTE — Telephone Encounter (Signed)
Reviewed patient's chart after running reports. Patient is overdue for a follow up appointment. Please reach out to the patient and schedule a follow up visit.

## 2022-11-14 ENCOUNTER — Ambulatory Visit: Payer: 59 | Admitting: Family Medicine

## 2022-11-18 ENCOUNTER — Ambulatory Visit (INDEPENDENT_AMBULATORY_CARE_PROVIDER_SITE_OTHER): Payer: 59 | Admitting: Family Medicine

## 2022-11-18 ENCOUNTER — Encounter: Payer: Self-pay | Admitting: Family Medicine

## 2022-11-18 VITALS — BP 114/73 | HR 82 | Temp 98.9°F | Wt 236.4 lb

## 2022-11-18 DIAGNOSIS — E1169 Type 2 diabetes mellitus with other specified complication: Secondary | ICD-10-CM | POA: Diagnosis not present

## 2022-11-18 DIAGNOSIS — E1159 Type 2 diabetes mellitus with other circulatory complications: Secondary | ICD-10-CM | POA: Diagnosis not present

## 2022-11-18 DIAGNOSIS — Z7985 Long-term (current) use of injectable non-insulin antidiabetic drugs: Secondary | ICD-10-CM

## 2022-11-18 DIAGNOSIS — E1165 Type 2 diabetes mellitus with hyperglycemia: Secondary | ICD-10-CM

## 2022-11-18 DIAGNOSIS — I152 Hypertension secondary to endocrine disorders: Secondary | ICD-10-CM | POA: Diagnosis not present

## 2022-11-18 DIAGNOSIS — E785 Hyperlipidemia, unspecified: Secondary | ICD-10-CM | POA: Diagnosis not present

## 2022-11-18 LAB — MICROALBUMIN, URINE WAIVED
Creatinine, Urine Waived: 50 mg/dL (ref 10–300)
Microalb, Ur Waived: 80 mg/L — ABNORMAL HIGH (ref 0–19)

## 2022-11-18 LAB — BAYER DCA HB A1C WAIVED: HB A1C (BAYER DCA - WAIVED): 12.7 % — ABNORMAL HIGH (ref 4.8–5.6)

## 2022-11-18 MED ORDER — TIRZEPATIDE 2.5 MG/0.5ML ~~LOC~~ SOAJ: 2.5000 mg | SUBCUTANEOUS | 0 refills | Status: DC

## 2022-11-18 MED ORDER — ONDANSETRON 4 MG PO TBDP: 4.0000 mg | ORAL_TABLET | Freq: Three times a day (TID) | ORAL | 0 refills | Status: DC | PRN

## 2022-11-18 MED ORDER — ROSUVASTATIN CALCIUM 5 MG PO TABS: 5.0000 mg | ORAL_TABLET | Freq: Every day | ORAL | 1 refills | Status: DC

## 2022-11-18 MED ORDER — DAPAGLIFLOZIN PROPANEDIOL 10 MG PO TABS: 10.0000 mg | ORAL_TABLET | Freq: Every day | ORAL | 1 refills | Status: DC

## 2022-11-18 NOTE — Patient Instructions (Addendum)
Start taking Mounjaro 2.5mg  weekly Increase Exercise to 150 mins weekly Incorporate fruits and vegetables in diet daily Return in 3 weeks to increase Mounjaro dosage

## 2022-11-18 NOTE — Assessment & Plan Note (Signed)
Chronic, controlled. BP today is 114/73. Recommend continued management with diet and exercise.

## 2022-11-18 NOTE — Assessment & Plan Note (Addendum)
Chronic, ongoing. A1C 12.7 today, up from 11.5 at 6 months ago visit. CBC, CMP, microalbumin, and A1C obtained today. Will discontinue Ozempic d/t patient feeling groggy with use. Continue Farxiga 10 mg daily, refill sent in. Will start Mounjaro 2.5 mg weekly for 1 month, and increase to 5 mg weekly in 1 month at next follow up visit if tolerating well. Diabetes management education given. Provided information on maintaining a healthy diet and increasing exercise to 150 mins weekly.

## 2022-11-18 NOTE — Progress Notes (Cosign Needed Addendum)
BP 114/73   Pulse 82   Temp 98.9 F (37.2 C) (Oral)   Wt 236 lb 6.4 oz (107.2 kg)   SpO2 97%   BMI 31.19 kg/m    Subjective:    Patient ID: Steve [name], male    DOB: 08-06-1980, 42 y.o.   MRN: [id number]  HPI: Steve [name] is a 42 y.o. male  Chief Complaint  Patient presents with   Diabetes   Hyperlipidemia    HYPERTENSION / HYPERLIPIDEMIA He is currently diet controlled for this and not taking any medications. He is not checking his blood pressures at home, BP is controlled, 114/73 today. He is taking Crestor 5 mg daily. His diet is okay, he admits to eating fruits and vegetables daily, drinking 6-7 ounces daily, and exercising every 2 days for one hour.   Satisfied with current treatment? yes Duration of hypertension: chronic BP monitoring frequency: not checking Past BP meds: none Duration of hyperlipidemia: chronic Cholesterol medication side effects: no Cholesterol supplements: none, may try fish oil Aspirin: no Recent stressors: yes Recurrent headaches: no Visual changes: no Palpitations: no Dyspnea: no Chest pain: no Lower extremity edema: no Dizzy/lightheaded: no     DIABETES He is currently taking Farxiga 10 mg daily and Ozempic 0.25 mg weekly, but will miss doses due to complaints of feeling "groggy" and nauseous from the Ozempic. He is requesting today to go back on the Ozempic. He is randomly checking his blood sugar at home in the mornings, ranging 130s-160s.  Hypoglycemic episodes:no Polydipsia/polyuria: yes Visual disturbance: no Chest pain: no Paresthesias: no Glucose Monitoring: yes  Accucheck frequency:  every 2 weeks  Fasting glucose:130s-160s Retinal Examination: Up to Date due 11/29/2022 Foot Exam: Up to Date Diabetic Education: Completed Pneumovax: unknown Influenza: Refused Aspirin: No  Relevant past medical, surgical, family and social history reviewed and updated as indicated. Interim medical history since our last  visit reviewed. Allergies and medications reviewed and updated.  Review of Systems  Constitutional: Negative.   Eyes: Negative.  Negative for visual disturbance.  Respiratory: Negative.    Cardiovascular:  Negative for chest pain, palpitations and leg swelling.  Gastrointestinal:  Positive for nausea.  Endocrine: Positive for polyuria. Negative for polydipsia and polyphagia.  Genitourinary:  Positive for frequency.  Neurological:  Negative for numbness.    Per HPI unless specifically indicated above     Objective:    BP 114/73   Pulse 82   Temp 98.9 F (37.2 C) (Oral)   Wt 236 lb 6.4 oz (107.2 kg)   SpO2 97%   BMI 31.19 kg/m   Wt Readings from Last 3 Encounters:  11/18/22 236 lb 6.4 oz (107.2 kg)  05/20/22 245 lb 4.8 oz (111.3 kg)  03/19/22 247 lb 8 oz (112.3 kg)    Physical Exam Vitals and nursing note reviewed.  Constitutional:      General: He is awake. He is not in acute distress.    Appearance: Normal appearance. He is well-developed and well-groomed. He is not ill-appearing.  HENT:     Head: Normocephalic and atraumatic.     Right Ear: Hearing and external ear normal. No drainage.     Left Ear: Hearing and external ear normal. No drainage.     Nose: Nose normal.  Eyes:     General: Lids are normal.        Right eye: No discharge.        Left eye: No discharge.     Conjunctiva/sclera: Conjunctivae  normal.  Cardiovascular:     Rate and Rhythm: Normal rate and regular rhythm.     Pulses:          Radial pulses are 2+ on the right side and 2+ on the left side.       Posterior tibial pulses are 2+ on the right side and 2+ on the left side.     Heart sounds: Normal heart sounds, S1 normal and S2 normal. No murmur heard.    No gallop.  Pulmonary:     Effort: Pulmonary effort is normal. No accessory muscle usage or respiratory distress.     Breath sounds: Normal breath sounds.  Musculoskeletal:        General: Normal range of motion.     Cervical back: Full  passive range of motion without pain and normal range of motion.     Right lower leg: No edema.     Left lower leg: No edema.  Skin:    General: Skin is warm and dry.     Capillary Refill: Capillary refill takes less than 2 seconds.  Neurological:     Mental Status: He is alert and oriented to person, place, and time.  Psychiatric:        Attention and Perception: Attention normal.        Mood and Affect: Mood normal.        Speech: Speech normal.        Behavior: Behavior normal. Behavior is cooperative.        Thought Content: Thought content normal.     Results for orders placed or performed in visit on 11/18/22  Bayer DCA Hb A1c Waived  Result Value Ref Range   HB A1C (BAYER DCA - WAIVED) 12.7 (H) 4.8 - 5.6 %  Microalbumin, Urine Waived  Result Value Ref Range   Microalb, Ur Waived 80 (H) 0 - 19 mg/L   Creatinine, Urine Waived 50 10 - 300 mg/dL   Microalb/Creat Ratio 30-300 (H) <30 mg/g      Assessment & Plan:   Problem List Items Addressed This Visit     Type 2 diabetes mellitus with hyperglycemia, without long-term current use of insulin (HCC) - Primary    Chronic, ongoing. A1C 12.7 today, up from 11.5 at 6 months ago visit. CBC, CMP, microalbumin, and A1C obtained today. Will discontinue Ozempic d/t patient feeling groggy with use. Continue Farxiga 10 mg daily, refill sent in. Will start Mounjaro 2.5 mg weekly for 1 month, and increase to 5 mg weekly in 1 month at next follow up visit if tolerating well. Diabetes management education given. Provided information on maintaining a healthy diet and increasing exercise to 150 mins weekly.       Relevant Medications   tirzepatide (MOUNJARO) 2.5 MG/0.5ML Pen   dapagliflozin propanediol (FARXIGA) 10 MG TABS tablet   rosuvastatin (CRESTOR) 5 MG tablet   Other Relevant Orders   Urine Microalbumin w/creat. ratio   Bayer DCA Hb A1c Waived (Completed)   Hypertension associated with diabetes (HCC)    Chronic, controlled. BP today  is 114/73. Recommend continued management with diet and exercise.       Relevant Medications   tirzepatide (MOUNJARO) 2.5 MG/0.5ML Pen   dapagliflozin propanediol (FARXIGA) 10 MG TABS tablet   rosuvastatin (CRESTOR) 5 MG tablet   Other Relevant Orders   Lipid Profile   Comp Met (CMET)   Urine Microalbumin w/creat. ratio   Hyperlipidemia associated with type 2 diabetes mellitus (HCC)  Chronic, ongoing. Lipid panel done today. Recommend continued use of Crestor 5 mg daily, may increase to 10 mg at next visit if levels remain elevated.       Relevant Medications   tirzepatide (MOUNJARO) 2.5 MG/0.5ML Pen   dapagliflozin propanediol (FARXIGA) 10 MG TABS tablet   rosuvastatin (CRESTOR) 5 MG tablet   Other Relevant Orders   Lipid Profile   Comp Met (CMET)     Follow up plan: Return in about 3 weeks (around 12/09/2022) for DM2, medicare wellness, Follow up.

## 2022-11-18 NOTE — Assessment & Plan Note (Signed)
Chronic, ongoing. Lipid panel done today. Recommend continued use of Crestor 5 mg daily, may increase to 10 mg at next visit if levels remain elevated.

## 2022-11-19 LAB — COMPREHENSIVE METABOLIC PANEL
ALT: 19 IU/L (ref 0–44)
AST: 13 IU/L (ref 0–40)
Albumin: 4.4 g/dL (ref 4.1–5.1)
Alkaline Phosphatase: 104 IU/L (ref 44–121)
BUN/Creatinine Ratio: 12 (ref 9–20)
BUN: 10 mg/dL (ref 6–24)
Bilirubin Total: 0.5 mg/dL (ref 0.0–1.2)
CO2: 20 mmol/L (ref 20–29)
Calcium: 9.6 mg/dL (ref 8.7–10.2)
Chloride: 99 mmol/L (ref 96–106)
Creatinine, Ser: 0.83 mg/dL (ref 0.76–1.27)
Globulin, Total: 2.6 g/dL (ref 1.5–4.5)
Glucose: 350 mg/dL — ABNORMAL HIGH (ref 70–99)
Potassium: 4.1 mmol/L (ref 3.5–5.2)
Sodium: 134 mmol/L (ref 134–144)
Total Protein: 7 g/dL (ref 6.0–8.5)
eGFR: 113 mL/min/{1.73_m2} (ref >59)

## 2022-11-19 LAB — LIPID PANEL
Chol/HDL Ratio: 10.3 ratio — ABNORMAL HIGH (ref 0.0–5.0)
Cholesterol, Total: 288 mg/dL — ABNORMAL HIGH (ref 100–199)
HDL: 28 mg/dL — ABNORMAL LOW (ref >39)
LDL Chol Calc (NIH): 141 mg/dL — ABNORMAL HIGH (ref 0–99)
Triglycerides: 621 mg/dL (ref 0–149)
VLDL Cholesterol Cal: 119 mg/dL — ABNORMAL HIGH (ref 5–40)

## 2022-11-19 NOTE — Addendum Note (Signed)
Addended by: Prescott Gum on: 11/19/2022 08:28 AM   Modules accepted: Orders

## 2022-12-02 ENCOUNTER — Telehealth: Payer: Self-pay | Admitting: *Deleted

## 2022-12-02 ENCOUNTER — Ambulatory Visit (INDEPENDENT_AMBULATORY_CARE_PROVIDER_SITE_OTHER): Payer: 59

## 2022-12-02 VITALS — Ht 73.0 in | Wt 236.0 lb

## 2022-12-02 DIAGNOSIS — Z0000 Encounter for general adult medical examination without abnormal findings: Secondary | ICD-10-CM | POA: Diagnosis not present

## 2022-12-02 DIAGNOSIS — Z599 Problem related to housing and economic circumstances, unspecified: Secondary | ICD-10-CM

## 2022-12-02 NOTE — Patient Instructions (Signed)
Steve [name] , Thank you for taking time to come for your Medicare Wellness Visit. I appreciate your ongoing commitment to your health goals. Please review the following plan we discussed and let me know if I can assist you in the future.   These are the goals we discussed:  Goals       DIET - EAT MORE FRUITS AND VEGETABLES      Monitor and Manage My Blood Sugar-Diabetes Type 2      Timeframe:  Long-Range Goal Priority:  High Start Date:                             Expected End Date:                       Follow Up Date  3 month follow up 09/06/20 Not checking BG, Not taking Trulicity as prescribed   - check blood sugar at prescribed times - enter blood sugar readings and medication or insulin into daily log - take the blood sugar meter to all doctor visits    Why is this important?   Checking your blood sugar at home helps to keep it from getting very high or very low.  Writing the results in a diary or log helps the doctor know how to care for you.  Your blood sugar log should have the time, date and the results.  Also, write down the amount of insulin or other medicine that you take.  Other information, like what you ate, exercise done and how you were feeling, will also be helpful.     Notes:       Obtain Eye Exam-Diabetes Type 2      Follow Up Date 3 month follow up   - schedule appointment with eye doctor    Why is this important?   Eye check-ups are important when you have diabetes.  Vision loss can be prevented.    Notes:       Patient Stated      6/27/20222, get healthier      Pharmacy Care Plan      CARE PLAN ENTRY (see longitudinal plan of care for additional care plan information)  Current Barriers:  Chronic Disease Management support, education, and care coordination needs related to Hypertension, Diabetes, GERD, and Tobacco use    Diabetes Lab Results  Component Value Date/Time   HGBA1C 7.6 (H) 03/15/2020 09:32 AM   HGBA1C 6.2 (H) 02/17/2019 04:47 PM    HGBA1C 10.9 (H) 11/12/2018 10:18 AM  Pharmacist Clinical Goal(s): Over the next 90 days, patient will work with PharmD and providers to achieve A1c goal <7% Current regimen:  Trulicity 1.5 mg weekly  Not currently checking BG. Will p/u glucometer and Trulicity tomorrow Interventions: Reviewed fill history Encouraged patient to pick up meter and begin checking at home. Reviewed goal glucose readings for an A1c of <7%, we want to see fasting sugars <130 and 2 hour after meal sugars <180.  Counseled on complications of diabetes including kidney & retinal damage and cardiovascular disease. Patient self care activities - Over the next 90 days, patient will: Check blood sugar twice daily, document, and provide at future appointments Contact provider with any episodes of hypoglycemia   GERD Pharmacist Clinical Goal(s) Over the next 90 days, patient will work with PharmD and providers to minimize symptoms Current regimen:  Dexilant 60 mg daily Interventions: Provided diet and exercise counseling.  Verified patient received egd/colonscopy results Patient self care activities - Over the next 90 days, patient will: Continue identifying and minimizing triggers  Tobacco use (Goal: achieve smoking cessation) -Uncontrolled -Previous quit attempts: several, NRT,  -Current treatment  NONE -Patient smokes Within 30 minutes of waking -Patient triggers include: driving and taking a work break and seeing someone else smoke -On a scale of 1-10, reports MOTIVATION to quit is 6-7 -On a scale of 1-10, reports CONFIDENCE in quitting is 2-3 -Provided contact information for [employer] (1-800-QUIT-NOW) and encouraged patient to reach out to this group for support.  -Educated on medication strategies including Chantix & Wellbutrin. Patient is interested in discussing with prescriber. Encouraged patient to make appointment with new PCP.  Medication management Pharmacist Clinical Goal(s): Over the  next 90 days, patient will work with PharmD and providers to achieve optimal medication adherence Current pharmacy: [facility] Interventions Comprehensive medication review performed. Continue current medication management strategy Patient self care activities - Over the next 90 days, patient will: Focus on medication adherence by fill dates Take medications as prescribed Report any questions or concerns to PharmD and/or provider(s)  Please see past updates related to this goal by clicking on the "Past Updates" button in the selected goal        [name]: "My blood pressure is good" (pt-stated)      CARE PLAN ENTRY (see longtitudinal plan of care for additional care plan information)  Current Barriers:  Chronic Disease Management support, education, and care coordination needs related to HTN and GERD with new onset of nausea and vomiting  Clinical Goal(s) related to HTN and GERD with new onset of nausea and vomiting  :  Over the next 120 days, patient will:  Work with the care management team to address educational, disease management, and care coordination needs  Begin or continue self health monitoring activities as directed today Measure and record blood pressure 1 times per week and adhere to a heart healthy/ADA diet Call provider office for new or worsened signs and symptoms Blood pressure findings outside established parameters and New or worsened symptom related to GERD/Nausea/vomiting Call care management team with questions or concerns Verbalize basic understanding of patient centered plan of care established today  Interventions related to HTN and GERD with new onset of nausea and vomiting  :  Evaluation of current treatment plans and patient's adherence to plan as established by provider.  The patient had a colonoscopy in June but has not had follow up for results. Educated on calling the specialist for follow up for results and recommendations. 02-23-2020: The patient  verbalized he is frustrated as he has not heard back from his colonoscopy and does not know the findings. The patient verbalized he had called the office but did not receive a return call. Explained the GI provider office was different than [facility] but would send a message to see if they could reach out to him to provide him with his requested results. The patient verbalized understanding. The [name] will send an in basket message for Dr. Midge Minium.  The [name] did call Dr. Wallace Keller office and left a detailed message for the nurse for Dr. Servando Snare asking the nurse to follow up with the patient concerning his colonoscopy results. 04-18-2020: The patient states he is doing well and his GERD and HTN are stable Assessed patient understanding of disease states. 04-18-2020: the patient has a good understanding of chronic conditions and is happy to note they are stable  at this time.  Assessed patient's education and care coordination needs.  04-18-2020: The patient expressed he just wants to do what he needs to do to stay healthy for his 68 year old child and himself. Emotional support provided.  Provided disease specific education to patient.  04-18-2020: Education on a heart healthy/ADA diet. The patient denies any issues with dietary restrictions at this time. Reminded the patient to monitor for sodium in foods especially with the upcoming holidays and the family gatherings.  Collaborated with appropriate clinical care team members regarding patient needs. LCSW for help with anxiety and smoking cessation, Pharmacist for help with medication reconciliation and possible what would help with smoking cessation.   Patient Self Care Activities related to HTN and GERD with new onset of Nausea and Vomiting :  Patient is unable to independently self-manage chronic health conditions  Please see past updates related to this goal by clicking on the "Past Updates" button in the selected goal        [name]: Pt-"I only check my  blood sugar once or twice every two weeks" (pt-stated)      CARE PLAN ENTRY (see longtitudinal plan of care for additional care plan information)  Objective:  Lab Results  Component Value Date   HGBA1C 7.6 (H) 03/15/2020    Lab Results  Component Value Date   CREATININE 0.87 03/15/2020   BUN 8 03/15/2020   NA 138 03/15/2020   K 4.2 03/15/2020   CL 103 03/15/2020   CO2 23 03/15/2020    No results found for: EGFR  Current Barriers:  Knowledge Deficits related to basic Diabetes pathophysiology and self care/management Knowledge Deficits related to medications used for management of diabetes Does not use cbg meter regularly- checks blood sugars once or twice a week- 04-18-2020- has a working meter at this time  Case Manager Clinical Goal(s):  Over the next 120 days, patient will demonstrate improved adherence to prescribed treatment plan for diabetes self care/management as evidenced by:  daily monitoring and recording of CBG or as pcp recommends  adherence to ADA/ carb modified diet exercise 3 days/week adherence to prescribed medication regimen  Interventions:  Provided education to patient about basic DM disease process.  04-18-2020: Education on monitoring DM closer. The patient has a working meter but states that he only takes his blood sugars one time a week. Education on taking blood sugars 2 to 3 times a week and prn as needed.  Reviewed medications with patient and discussed importance of medication adherence.  The patient endorses compliance.  04-18-2020: The patient endorses compliance with medication regimen.  Discussed plans with patient for ongoing care management follow up and provided patient with direct contact information for care management team Provided patient with written educational materials related to hypo and hyperglycemia and importance of correct treatment.  04-18-2020: Reviewed with the patient sx/sx of hypo and hyperglycemia and how to detect changes in  blood sugar range.  Advised patient, providing education and rationale, to check cbg as prescribed  and record, calling pcp for findings outside established parameters.  The patient states that his blood sugars are doing well and denies any concerns related to glucose readings or dietary restrictions.04-18-2020 the patient states last time he took his blood sugar was last week and it was 130.  Denies any episodes of low blood sugars. Review of hemoglobin A1C elevation of 7.6 and the goal of hemoglobin A1C of 7.0 or less. Will continue to work with patient in managing DM  Patient Self Care Activities:  UNABLE to independently manage diabetes effectively  Please see past updates related to this goal by clicking on the "Past Updates" button in the selected goal        [name]: Pt: "I am going to try to quit smoking in 2 weeks" (pt-stated)      CARE PLAN ENTRY (see longtitudinal plan of care for additional care plan information)  Current Barriers:  Tobacco abuse of  > 20 years; currently smoking 1/2 ppd Previous quit attempts, per the patient he has never tried to quit before. Plans on quitting "cold [place]" in a couple of weeks Denies smoking within 30 minutes of waking up Reports triggers to smoke include: health, stressors in life, anxiety Reports motivation to quit smoking includes: knowing it will help him feel better On a scale of 1-10, reports MOTIVATION to quit is 8 On a scale of 1-10, reports CONFIDENCE in quitting is 7  Clinical Goal(s):  Over the next 120 days, patient will work with RN Case Child psychotherapist Licensed Clinical Social Worker and provider towards tobacco cessation   Interventions: Evaluation of current treatment plan reviewed.  The patient verbalized he is trying to cut back and quit. Has not been successful. Will work with the CCM team to meet health and wellness goals. 04-18-2020: The patient still has not been successful at quitting smoking. The patient wants to be  healthy for his daughter. Will continue to work with the CCM team to help the patient toward smoking cessation.  Provided contact information for [place] [facility] (1-800-QUIT-NOW). Patient will outreach this group for support. Discussed plans with patient for ongoing care management follow up and provided patient with direct contact information for care management team Provided patient with written smoking cessation educational materials through the [place] and EMMI educational system Referred patient to pharmacy team Provided contact information for [place] [facility] (1-800-QUIT-NOW). Patient will outreach this group for support.   Patient Self Care Activities:  Patient will commit to reducing tobacco consumption  Please see past updates related to this goal by clicking on the "Past Updates" button in the selected goal          This is a list of the screening recommended for you and due dates:  Health Maintenance  Topic Date Due   DTaP/Tdap/Td vaccine (1 - Tdap) Never done   Eye exam for diabetics  11/29/2022   COVID-19 Vaccine (1) 12/04/2022*   Flu Shot  01/02/2023   Complete foot exam   02/15/2023   Hemoglobin A1C  05/20/2023   Yearly kidney function blood test for diabetes  11/18/2023   Yearly kidney health urinalysis for diabetes  11/18/2023   Medicare Annual Wellness Visit  12/02/2023   Colon Cancer Screening  11/14/2024   Hepatitis C Screening  Completed   HIV Screening  Completed   HPV Vaccine  Aged Out  *Topic was postponed. The date shown is not the original due date.    Advanced directives: no  Conditions/risks identified: none  Next appointment: Follow up in one year for your annual wellness visit 12/08/23 @ 9:15 am by phone  Preventive Care 40-64 Years, Male Preventive care refers to lifestyle choices and visits with your health care provider that can promote health and wellness. What does preventive care include? A yearly physical exam. This is also called an annual  well check. Dental exams once or twice a year. Routine eye exams. Ask your health care provider how often you should have your  eyes checked. Personal lifestyle choices, including: Daily care of your teeth and gums. Regular physical activity. Eating a healthy diet. Avoiding tobacco and drug use. Limiting alcohol use. Practicing safe sex. Taking low-dose aspirin every day starting at age 77. What happens during an annual well check? The services and screenings done by your health care provider during your annual well check will depend on your age, overall health, lifestyle risk factors, and family history of disease. Counseling  Your health care provider may ask you questions about your: Alcohol use. Tobacco use. Drug use. Emotional well-being. Home and relationship well-being. Sexual activity. Eating habits. Work and work Astronomer. Screening  You may have the following tests or measurements: Height, weight, and BMI. Blood pressure. Lipid and cholesterol levels. These may be checked every 5 years, or more frequently if you are over 84 years old. Skin check. Lung cancer screening. You may have this screening every year starting at age 36 if you have a 30-pack-year history of smoking and currently smoke or have quit within the past 15 years. Fecal occult blood test (FOBT) of the stool. You may have this test every year starting at age 53. Flexible sigmoidoscopy or colonoscopy. You may have a sigmoidoscopy every 5 years or a colonoscopy every 10 years starting at age 12. Prostate cancer screening. Recommendations will vary depending on your family history and other risks. Hepatitis C blood test. Hepatitis B blood test. Sexually transmitted disease (STD) testing. Diabetes screening. This is done by checking your blood sugar (glucose) after you have not eaten for a while (fasting). You may have this done every 1-3 years. Discuss your test results, treatment options, and if necessary,  the need for more tests with your health care provider. Vaccines  Your health care provider may recommend certain vaccines, such as: Influenza vaccine. This is recommended every year. Tetanus, diphtheria, and acellular pertussis (Tdap, Td) vaccine. You may need a Td booster every 10 years. Zoster vaccine. You may need this after age 22. Pneumococcal 13-valent conjugate (PCV13) vaccine. You may need this if you have certain conditions and have not been vaccinated. Pneumococcal polysaccharide (PPSV23) vaccine. You may need one or two doses if you smoke cigarettes or if you have certain conditions. Talk to your health care provider about which screenings and vaccines you need and how often you need them. This information is not intended to replace advice given to you by your health care provider. Make sure you discuss any questions you have with your health care provider. Document Released: 06/16/2015 Document Revised: 02/07/2016 Document Reviewed: 03/21/2015 Elsevier Interactive Patient Education  2017 [employer].  Fall Prevention in the Home Falls can cause injuries. They can happen to people of all ages. There are many things you can do to make your home safe and to help prevent falls. What can I do on the outside of my home? Regularly fix the edges of walkways and driveways and fix any cracks. Remove anything that might make you trip as you walk through a door, such as a raised step or threshold. Trim any bushes or trees on the path to your home. Use bright outdoor lighting. Clear any walking paths of anything that might make someone trip, such as rocks or tools. Regularly check to see if handrails are loose or broken. Make sure that both sides of any steps have handrails. Any raised decks and porches should have guardrails on the edges. Have any leaves, snow, or ice cleared regularly. Use sand or salt on walking  paths during winter. Clean up any spills in your garage right away. This  includes oil or grease spills. What can I do in the bathroom? Use night lights. Install grab bars by the toilet and in the tub and shower. Do not use towel bars as grab bars. Use non-skid mats or decals in the tub or shower. If you need to sit down in the shower, use a plastic, non-slip stool. Keep the floor dry. Clean up any water that spills on the floor as soon as it happens. Remove soap buildup in the tub or shower regularly. Attach bath mats securely with double-sided non-slip rug tape. Do not have throw rugs and other things on the floor that can make you trip. What can I do in the bedroom? Use night lights. Make sure that you have a light by your bed that is easy to reach. Do not use any sheets or blankets that are too big for your bed. They should not hang down onto the floor. Have a firm chair that has side arms. You can use this for support while you get dressed. Do not have throw rugs and other things on the floor that can make you trip. What can I do in the kitchen? Clean up any spills right away. Avoid walking on wet floors. Keep items that you use a lot in easy-to-reach places. If you need to reach something above you, use a strong step stool that has a grab bar. Keep electrical cords out of the way. Do not use floor polish or wax that makes floors slippery. If you must use wax, use non-skid floor wax. Do not have throw rugs and other things on the floor that can make you trip. What can I do with my stairs? Do not leave any items on the stairs. Make sure that there are handrails on both sides of the stairs and use them. Fix handrails that are broken or loose. Make sure that handrails are as long as the stairways. Check any carpeting to make sure that it is firmly attached to the stairs. Fix any carpet that is loose or worn. Avoid having throw rugs at the top or bottom of the stairs. If you do have throw rugs, attach them to the floor with carpet tape. Make sure that you  have a light switch at the top of the stairs and the bottom of the stairs. If you do not have them, ask someone to add them for you. What else can I do to help prevent falls? Wear shoes that: Do not have high heels. Have rubber bottoms. Are comfortable and fit you well. Are closed at the toe. Do not wear sandals. If you use a stepladder: Make sure that it is fully opened. Do not climb a closed stepladder. Make sure that both sides of the stepladder are locked into place. Ask someone to hold it for you, if possible. Clearly mark and make sure that you can see: Any grab bars or handrails. First and last steps. Where the edge of each step is. Use tools that help you move around (mobility aids) if they are needed. These include: Canes. Walkers. Scooters. Crutches. Turn on the lights when you go into a dark area. Replace any light bulbs as soon as they burn out. Set up your furniture so you have a clear path. Avoid moving your furniture around. If any of your floors are uneven, fix them. If there are any pets around you, be aware of where they are.  Review your medicines with your doctor. Some medicines can make you feel dizzy. This can increase your chance of falling. Ask your doctor what other things that you can do to help prevent falls. This information is not intended to replace advice given to you by your health care provider. Make sure you discuss any questions you have with your health care provider. Document Released: 03/16/2009 Document Revised: 10/26/2015 Document Reviewed: 06/24/2014 Elsevier Interactive Patient Education  2017 [employer].

## 2022-12-02 NOTE — Progress Notes (Signed)
  Care Coordination   Note   12/02/2022 Name: Steve [name] MRN: [id number] DOB: 1980/09/30  [name] is a 42 y.o. year old male who sees Larae Grooms, NP for primary care. I reached out to [employer] by phone today to offer care coordination services.  [name] was given information about Care Coordination services today including:   The Care Coordination services include support from the care team which includes your Nurse Coordinator, Clinical Social Worker, or Pharmacist.  The Care Coordination team is here to help remove barriers to the health concerns and goals most important to you. Care Coordination services are voluntary, and the patient may decline or stop services at any time by request to their care team member.   Care Coordination Consent Status: Patient agreed to services and verbal consent obtained.   Follow up plan:  Telephone appointment with care coordination team member scheduled for:  12/03/2022  Encounter Outcome:  Pt. Scheduled from referral   Burman Nieves, [facility] Care Coordination Care Guide Direct Dial: [phone]

## 2022-12-02 NOTE — Progress Notes (Signed)
Subjective:   Steve [name] is a 42 y.o. male who presents for Medicare Annual/Subsequent preventive examination.  Visit Complete: Virtual  I connected with  [name] on 12/02/22 by a audio enabled telemedicine application and verified that I am speaking with the correct person using two identifiers.  Patient Location: Home  Provider Location: Office/Clinic  I discussed the limitations of evaluation and management by telemedicine. The patient expressed understanding and agreed to proceed.  Review of Systems     Cardiac Risk Factors include: hypertension;male gender;smoking/ tobacco exposure;obesity (BMI >30kg/m2)     Objective:    Today's Vitals   12/02/22 0929 12/02/22 0941  Weight:  236 lb (107 kg)  Height:  6\' 1"  (1.854 m)  PainSc: 0-No pain    Body mass index is 31.14 kg/m.     12/02/2022    9:33 AM 11/29/2021   11:16 AM 11/27/2020   10:32 AM 11/15/2019    9:47 AM 09/09/2019    9:46 AM 09/05/2016    9:26 AM 11/20/2015    8:09 PM  Advanced Directives  Does Patient Have a Medical Advance Directive? No No No Yes No No No  Type of Print production planner of Healthcare Power of Attorney in Chart?    No - copy requested     Would patient like information on creating a medical advance directive? No - Patient declined No - Patient declined         Current Medications (verified) Outpatient Encounter Medications as of 12/02/2022  Medication Sig   dapagliflozin propanediol (FARXIGA) 10 MG TABS tablet Take 1 tablet (10 mg total) by mouth daily before breakfast.   dexlansoprazole (DEXILANT) 60 MG capsule TAKE 1 CAPSULE BY MOUTH EVERY DAY   ondansetron (ZOFRAN ODT) 4 MG disintegrating tablet Take 1 tablet (4 mg total) by mouth every 8 (eight) hours as needed for nausea or vomiting.   rosuvastatin (CRESTOR) 5 MG tablet Take 1 tablet (5 mg total) by mouth daily.   tirzepatide [facility]) 2.5 MG/0.5ML Pen Inject 2.5 mg into the skin once a  week. (Patient not taking: Reported on 12/02/2022)   No facility-administered encounter medications on file as of 12/02/2022.    Allergies (verified) Patient has no known allergies.   History: Past Medical History:  Diagnosis Date   Diabetes mellitus without complication (HCC)    GERD (gastroesophageal reflux disease)    History of gastritis 09/2016   Hypertension    Past Surgical History:  Procedure Laterality Date   COLONOSCOPY WITH PROPOFOL N/A 11/15/2019   Procedure: COLONOSCOPY WITH PROPOFOL WITH POLYPECTOMY, BIOPSIES;  Surgeon: Midge Minium, MD;  Location: [facility];  Service: Endoscopy;  Laterality: N/A;   ESOPHAGOGASTRODUODENOSCOPY (EGD) WITH PROPOFOL N/A 11/15/2019   Procedure: ESOPHAGOGASTRODUODENOSCOPY (EGD) WITH PROPOFOL WITH POLYPECTOMY, BIOPSIES;  Surgeon: Midge Minium, MD;  Location: [facility];  Service: Endoscopy;  Laterality: N/A;   TOOTH EXTRACTION     Family History  Problem Relation Age of Onset   Diabetes Brother    Hypertension Brother    Diabetes Maternal Aunt    Hypertension Maternal Aunt    Cancer Maternal Aunt        pancreas   Diabetes Maternal Uncle    Hypertension Maternal Uncle    Cancer Maternal Uncle        bone   Diabetes Maternal Grandmother    Cancer Maternal Grandmother    Diabetes Maternal Grandfather  Cancer Maternal Uncle        lung   COPD Neg Hx    Heart disease Neg Hx    Stroke Neg Hx    Social History   Socioeconomic History   Marital status: Single    Spouse name: Not on file   Number of children: Not on file   Years of education: Not on file   Highest education level: Not on file  Occupational History   Not on file  Tobacco Use   Smoking status: Every Day    Packs/day: 0.25    Years: 23.00    Additional pack years: 0.00    Total pack years: 5.75    Types: Cigarettes   Smokeless tobacco: Never   Tobacco comments:    since age 22  Vaping Use   Vaping Use: Every day  Substance and Sexual  Activity   Alcohol use: No   Drug use: Yes    Types: Marijuana   Sexual activity: Yes  Other Topics Concern   Not on file  Social History Narrative   Not on file   Social Determinants of Health   Financial Resource Strain: Medium Risk (12/02/2022)   Overall Financial Resource Strain (CARDIA)    Difficulty of Paying Living Expenses: Somewhat hard  Food Insecurity: No Food Insecurity (12/02/2022)   Hunger Vital Sign    Worried About Running Out of Food in the Last Year: Never true    Ran Out of Food in the Last Year: Never true  Transportation Needs: No Transportation Needs (12/02/2022)   PRAPARE - Administrator, Civil Service (Medical): No    Lack of Transportation (Non-Medical): No  Physical Activity: Sufficiently Active (12/02/2022)   Exercise Vital Sign    Days of Exercise per Week: 3 days    Minutes of Exercise per Session: 60 min  Stress: No Stress Concern Present (12/02/2022)   [employer] of Occupational Health - Occupational Stress Questionnaire    Feeling of Stress : Not at all  Social Connections: Socially Isolated (12/02/2022)   Social Connection and Isolation Panel [NHANES]    Frequency of Communication with Friends and Family: Once a week    Frequency of Social Gatherings with Friends and Family: Never    Attends Religious Services: Never    Database administrator or Organizations: Yes    Attends Banker Meetings: Never    Marital Status: Never married    Tobacco Counseling Ready to quit: Not Answered Counseling given: Not Answered Tobacco comments: since age 37   Clinical Intake:  Pre-visit preparation completed: Yes  Pain : No/denies pain Pain Score: 0-No pain     Nutritional Risks: None Diabetes: Yes CBG done?: No Did pt. bring in CBG monitor from home?: No  How often do you need to have someone help you when you read instructions, pamphlets, or other written materials from your doctor or pharmacy?: 1 - Never  Interpreter  Needed?: No  Information entered by :: Kennedy Bucker, LPN   Activities of Daily Living    12/02/2022    9:34 AM  In your present state of health, do you have any difficulty performing the following activities:  Hearing? 0  Vision? 0  Difficulty concentrating or making decisions? 0  Walking or climbing stairs? 0  Dressing or bathing? 0  Doing errands, shopping? 1  Preparing Food and eating ? N  Using the Toilet? N  In the past six months, have you accidently leaked  urine? N  Do you have problems with loss of bowel control? N  Managing your Medications? N  Managing your Finances? N  Housekeeping or managing your Housekeeping? N    Patient Care Team: Larae Grooms, NP as PCP - General Tiburcio Pea, [facility], [facility] (Inactive) (Pharmacist)  Indicate any recent Medical Services you may have received from other than [facility] providers in the past year (date may be approximate).     Assessment:   This is a routine wellness examination for Jachai.  Hearing/Vision screen Hearing Screening - Comments:: No aids Vision Screening - Comments:: Wears glasses- Dr.Woodard   Dietary issues and exercise activities discussed:     Goals Addressed             This Visit's Progress    DIET - EAT MORE FRUITS AND VEGETABLES         Depression Screen    12/02/2022    9:31 AM 11/18/2022    9:36 AM 03/19/2022   10:26 AM 02/14/2022    4:33 PM 11/29/2021   11:23 AM 11/27/2020   10:33 AM 11/09/2020   10:16 AM  PHQ 2/9 Scores  PHQ - 2 Score 2 1 0 1 1 0 1  PHQ- 9 Score 3 6 0 5 1  6     Fall Risk    12/02/2022    9:34 AM 11/18/2022    9:35 AM 03/19/2022   10:25 AM 02/14/2022    4:33 PM 11/29/2021   11:19 AM  Fall Risk   Falls in the past year? 0 0 0 0 0  Number falls in past yr: 0 0 0 0 0  Injury with Fall? 0 0 0 0 0  Risk for fall due to : No Fall Risks No Fall Risks No Fall Risks No Fall Risks   Follow up Falls prevention discussed;Falls evaluation completed Falls evaluation completed Falls  evaluation completed Falls evaluation completed Falls evaluation completed;Education provided;Falls prevention discussed    MEDICARE RISK AT HOME:  Medicare Risk at Home - 12/02/22 0935     Any stairs in or around the home? No    If so, are there any without handrails? No    Home free of loose throw rugs in walkways, pet beds, electrical cords, etc? Yes    Adequate lighting in your home to reduce risk of falls? Yes    Life alert? No    Use of a cane, walker or w/c? No    Grab bars in the bathroom? No    Shower chair or bench in shower? No    Elevated toilet seat or a handicapped toilet? No             TIMED UP AND GO:  Was the test performed?  No    Cognitive Function:        11/29/2021   11:20 AM 11/27/2020   10:36 AM  6CIT Screen  What Year? 0 points 0 points  What month? 0 points 0 points  What time? 0 points 0 points  Count back from 20 0 points 0 points  Months in reverse 0 points 0 points  Repeat phrase 0 points 10 points  Total Score 0 points 10 points    Immunizations  There is no immunization history on file for this patient.  TDAP status: Due, Education has been provided regarding the importance of this vaccine. Advised may receive this vaccine at local pharmacy or Health Dept. Aware to provide a copy of the vaccination record  if obtained from local pharmacy or Health Dept. Verbalized acceptance and understanding.  Flu Vaccine status: Declined, Education has been provided regarding the importance of this vaccine but patient still declined. Advised may receive this vaccine at local pharmacy or Health Dept. Aware to provide a copy of the vaccination record if obtained from local pharmacy or Health Dept. Verbalized acceptance and understanding.  Pneumococcal vaccine status: Declined,  Education has been provided regarding the importance of this vaccine but patient still declined. Advised may receive this vaccine at local pharmacy or Health Dept. Aware to  provide a copy of the vaccination record if obtained from local pharmacy or Health Dept. Verbalized acceptance and understanding.   Covid-19 vaccine status: Declined, Education has been provided regarding the importance of this vaccine but patient still declined. Advised may receive this vaccine at local pharmacy or Health Dept.or vaccine clinic. Aware to provide a copy of the vaccination record if obtained from local pharmacy or Health Dept. Verbalized acceptance and understanding.  Qualifies for Shingles Vaccine? Yes   Zostavax completed No   Shingrix Completed?: No.    Education has been provided regarding the importance of this vaccine. Patient has been advised to call insurance company to determine out of pocket expense if they have not yet received this vaccine. Advised may also receive vaccine at local pharmacy or Health Dept. Verbalized acceptance and understanding.  Screening Tests Health Maintenance  Topic Date Due   DTaP/Tdap/Td (1 - Tdap) Never done   OPHTHALMOLOGY EXAM  11/29/2022   COVID-19 Vaccine (1) 12/04/2022 (Originally 08/24/1981)   INFLUENZA VACCINE  01/02/2023   FOOT EXAM  02/15/2023   HEMOGLOBIN A1C  05/20/2023   Diabetic kidney evaluation - eGFR measurement  11/18/2023   Diabetic kidney evaluation - Urine ACR  11/18/2023   Medicare Annual Wellness (AWV)  12/02/2023   Colonoscopy  11/14/2024   Hepatitis C Screening  Completed   HIV Screening  Completed   HPV VACCINES  Aged Out    Health Maintenance  Health Maintenance Due  Topic Date Due   DTaP/Tdap/Td (1 - Tdap) Never done   OPHTHALMOLOGY EXAM  11/29/2022    Colorectal cancer screening: Type of screening: Colonoscopy. Completed 11/15/19. Repeat every 5 years  Lung Cancer Screening: (Low Dose CT Chest recommended if Age 80-80 years, 20 pack-year currently smoking OR have quit w/in 15years.) does not qualify.     Additional Screening:  Hepatitis C Screening: does qualify; Completed 09/12/20  Vision  Screening: Recommended annual ophthalmology exams for early detection of glaucoma and other disorders of the eye. Is the patient up to date with their annual eye exam?  Yes  Who is the provider or what is the name of the office in which the patient attends annual eye exams? Dr.Woodard If pt is not established with a provider, would they like to be referred to a provider to establish care? No .   Dental Screening: Recommended annual dental exams for proper oral hygiene  Diabetic Foot Exam: Diabetic Foot Exam: Completed 02/14/22  Community Resource Referral / Chronic Care Management: CRR required this visit?  Yes   CCM required this visit?  No     Plan:     I have personally reviewed and noted the following in the patient's chart:   Medical and social history Use of alcohol, tobacco or illicit drugs  Current medications and supplements including opioid prescriptions. Patient is not currently taking opioid prescriptions. Functional ability and status Nutritional status Physical activity Advanced directives List of  other physicians Hospitalizations, surgeries, and ER visits in previous 12 months Vitals Screenings to include cognitive, depression, and falls Referrals and appointments  In addition, I have reviewed and discussed with patient certain preventive protocols, quality metrics, and best practice recommendations. A written personalized care plan for preventive services as well as general preventive health recommendations were provided to patient.     Hal Hope, LPN   06/08/1094   After Visit Summary: (MyChart) Due to this being a telephonic visit, the after visit summary with patients personalized plan was offered to patient via MyChart   Nurse Notes: none

## 2022-12-03 ENCOUNTER — Telehealth: Payer: Self-pay

## 2022-12-03 ENCOUNTER — Ambulatory Visit: Payer: Self-pay

## 2022-12-03 NOTE — Telephone Encounter (Signed)
   Telephone encounter was:  Successful.  12/03/2022 Name: MADHAV LEVENS MRN: [id number] DOB: [birth year]  Steve [name] is a 42 y.o. year old male who is a primary care patient of Larae Grooms, NP . The community resource team was consulted for assistance with Food Insecurity and Financial Difficulties related to utilities.  Care guide performed the following interventions: Spoke with patient about, he stated that he has received housing resources from Child psychotherapist [employer] and needed utility and food resources. Verified email address to send information. Letter saved in Epic.  Follow Up Plan:  No further follow up planned at this time. The patient has been provided with needed resources.  Deaven Barron [facility]  [facility] Population Health Community Resource Care Guide   [email or web address]  ?? [phone]   Website: [email or web address]

## 2022-12-03 NOTE — Patient Outreach (Signed)
  Care Coordination   Initial Visit Note   12/03/2022 Name: Steve [name] MRN: [id number] DOB: [birth year]  Steve [name] is a 42 y.o. year old male who sees Steve Grooms, NP for primary care. I spoke with  Steve [name] by phone today.  What matters to the patients health and wellness today?  Patient lives in a home that is being sold and needs to move in about 60 days.  The owner is working with him.  Patient has not started his housing search.      Goals Addressed             This Visit's Progress    Housing       Care Coordination Interventions: Patient would like to obtain housing within the next 60 days Patient will utilize [email or web address] and online search to locate affordable housing Patient will consider FNS application in the future         SDOH assessments and interventions completed:  YES   SDOH Interventions Today    Flowsheet Row Most Recent Value  SDOH Interventions   Housing Interventions Other (Comment)  [Wants housing resources]        Care Coordination Interventions:  Yes, provided  Interventions Today    Flowsheet Row Most Recent Value  General Interventions   General Interventions Discussed/Reviewed General Interventions Discussed, Science writer declines community resources for financial assistance and has some savings to move.  Patient has SSA and can afford $700-900 rent.]        Follow up plan: No further intervention required.   Encounter Outcome:  Pt. Visit Completed

## 2022-12-03 NOTE — Patient Instructions (Signed)
Visit Information  Thank you for taking time to visit with me today. Please don't hesitate to contact me if I can be of assistance to you.   Following are the goals we discussed today:   Goals Addressed             This Visit's Progress    Housing       Care Coordination Interventions: Patient would like to obtain housing within the next 60 days Patient will utilize [email or web address] and online search to locate affordable housing Patient will consider FNS application in the future          If you are experiencing a Mental Health or Behavioral Health Crisis or need someone to talk to, please call 911  Patient verbalizes understanding of instructions and care plan provided today and agrees to view in MyChart. Active MyChart status and patient understanding of how to access instructions and care plan via MyChart confirmed with patient.     No further follow up required:    Lysle Morales, BSW Social Worker [facility] Care Management  [phone]

## 2022-12-09 ENCOUNTER — Ambulatory Visit: Payer: 59 | Admitting: Family Medicine

## 2022-12-25 ENCOUNTER — Telehealth: Payer: Self-pay | Admitting: Family Medicine

## 2022-12-25 ENCOUNTER — Emergency Department
Admission: EM | Admit: 2022-12-25 | Discharge: 2022-12-25 | Disposition: A | Discharge status: Home / Self Care | Payer: 59 | Attending: Emergency Medicine | Admitting: Emergency Medicine

## 2022-12-25 ENCOUNTER — Emergency Department: Payer: 59

## 2022-12-25 ENCOUNTER — Other Ambulatory Visit: Payer: Self-pay

## 2022-12-25 DIAGNOSIS — E119 Type 2 diabetes mellitus without complications: Secondary | ICD-10-CM | POA: Insufficient documentation

## 2022-12-25 DIAGNOSIS — I10 Essential (primary) hypertension: Secondary | ICD-10-CM | POA: Diagnosis not present

## 2022-12-25 DIAGNOSIS — N492 Inflammatory disorders of scrotum: Secondary | ICD-10-CM | POA: Insufficient documentation

## 2022-12-25 DIAGNOSIS — R1030 Lower abdominal pain, unspecified: Secondary | ICD-10-CM | POA: Diagnosis present

## 2022-12-25 DIAGNOSIS — L02214 Cutaneous abscess of groin: Secondary | ICD-10-CM | POA: Diagnosis not present

## 2022-12-25 LAB — COMPREHENSIVE METABOLIC PANEL
ALT: 15 U/L (ref 0–44)
AST: 14 U/L — ABNORMAL LOW (ref 15–41)
Albumin: 3.9 g/dL (ref 3.5–5.0)
Alkaline Phosphatase: 67 U/L (ref 38–126)
Anion gap: 9 (ref 5–15)
BUN: 8 mg/dL (ref 6–20)
CO2: 25 mmol/L (ref 22–32)
Calcium: 9 mg/dL (ref 8.9–10.3)
Chloride: 97 mmol/L — ABNORMAL LOW (ref 98–111)
Creatinine, Ser: 0.9 mg/dL (ref 0.61–1.24)
GFR, Estimated: >60 mL/min (ref >60)
Glucose, Bld: 289 mg/dL — ABNORMAL HIGH (ref 70–99)
Potassium: 4 mmol/L (ref 3.5–5.1)
Sodium: 131 mmol/L — ABNORMAL LOW (ref 135–145)
Total Bilirubin: 2 mg/dL — ABNORMAL HIGH (ref 0.3–1.2)
Total Protein: 7.4 g/dL (ref 6.5–8.1)

## 2022-12-25 LAB — CBC WITH DIFFERENTIAL/PLATELET
Abs Immature Granulocytes: 0.03 10*3/uL (ref 0.00–0.07)
Basophils Absolute: 0 10*3/uL (ref 0.0–0.1)
Basophils Relative: 0 %
Eosinophils Absolute: 0 10*3/uL (ref 0.0–0.5)
Eosinophils Relative: 0 %
HCT: 41 % (ref 39.0–52.0)
Hemoglobin: 13.7 g/dL (ref 13.0–17.0)
Immature Granulocytes: 0 %
Lymphocytes Relative: 7 %
Lymphs Abs: 0.6 10*3/uL — ABNORMAL LOW (ref 0.7–4.0)
MCH: 27.9 pg (ref 26.0–34.0)
MCHC: 33.4 g/dL (ref 30.0–36.0)
MCV: 83.5 fL (ref 80.0–100.0)
Monocytes Absolute: 1.1 10*3/uL — ABNORMAL HIGH (ref 0.1–1.0)
Monocytes Relative: 12 %
Neutro Abs: 7.7 10*3/uL (ref 1.7–7.7)
Neutrophils Relative %: 81 %
Platelets: 160 10*3/uL (ref 150–400)
RBC: 4.91 MIL/uL (ref 4.22–5.81)
RDW: 11.9 % (ref 11.5–15.5)
WBC: 9.5 10*3/uL (ref 4.0–10.5)
nRBC: 0 % (ref 0.0–0.2)

## 2022-12-25 MED ORDER — HYDROCODONE-ACETAMINOPHEN 5-325 MG PO TABS: 1.0000 | ORAL_TABLET | Freq: Four times a day (QID) | ORAL | 0 refills | Status: DC | PRN

## 2022-12-25 MED ORDER — IOHEXOL 300 MG/ML  SOLN
100.0000 mL | Freq: Once | INTRAMUSCULAR | Status: AC | PRN
  Administered 2022-12-25: 100 mL via INTRAVENOUS

## 2022-12-25 MED ORDER — SODIUM CHLORIDE 0.9 % IV SOLN
1.0000 g | Freq: Once | INTRAVENOUS | Status: AC
  Administered 2022-12-25: 1 g via INTRAVENOUS
  Filled 2022-12-25: qty 10

## 2022-12-25 MED ORDER — SULFAMETHOXAZOLE-TRIMETHOPRIM 800-160 MG PO TABS: 1.0000 | ORAL_TABLET | Freq: Two times a day (BID) | ORAL | 0 refills | Status: DC

## 2022-12-25 NOTE — Telephone Encounter (Cosign Needed)
Patient needed Rx sent to different pharmacy. [facility] in [place] out of pain meds. They are aware to cancel that prescription. New Rx sent to [facility].

## 2022-12-25 NOTE — ED Notes (Signed)
Patient Alert and oriented to baseline. Stable and ambulatory to baseline. Patient verbalized understanding of the discharge instructions.  Patient belongings were taken by the patient.   

## 2022-12-25 NOTE — ED Triage Notes (Signed)
Pt to ED for boil to R groin boil/abscess since 2 days ago. Pt tried to squeeze it last night. Did not take BP meds today.

## 2022-12-28 ENCOUNTER — Emergency Department: Payer: 59

## 2022-12-28 ENCOUNTER — Inpatient Hospital Stay
Admission: EM | Admit: 2022-12-28 | Discharge: 2023-01-06 | DRG: 853 | Disposition: A | Discharge status: Home Health | Payer: 59 | Attending: Internal Medicine | Admitting: Internal Medicine

## 2022-12-28 ENCOUNTER — Other Ambulatory Visit: Payer: Self-pay

## 2022-12-28 DIAGNOSIS — Z7985 Long-term (current) use of injectable non-insulin antidiabetic drugs: Secondary | ICD-10-CM

## 2022-12-28 DIAGNOSIS — L03315 Cellulitis of perineum: Secondary | ICD-10-CM | POA: Diagnosis present

## 2022-12-28 DIAGNOSIS — M726 Necrotizing fasciitis: Secondary | ICD-10-CM | POA: Diagnosis not present

## 2022-12-28 DIAGNOSIS — Z808 Family history of malignant neoplasm of other organs or systems: Secondary | ICD-10-CM

## 2022-12-28 DIAGNOSIS — I10 Essential (primary) hypertension: Secondary | ICD-10-CM | POA: Diagnosis present

## 2022-12-28 DIAGNOSIS — Z5986 Financial insecurity: Secondary | ICD-10-CM

## 2022-12-28 DIAGNOSIS — Z833 Family history of diabetes mellitus: Secondary | ICD-10-CM

## 2022-12-28 DIAGNOSIS — B9620 Unspecified Escherichia coli [E. coli] as the cause of diseases classified elsewhere: Secondary | ICD-10-CM | POA: Diagnosis present

## 2022-12-28 DIAGNOSIS — Z79899 Other long term (current) drug therapy: Secondary | ICD-10-CM | POA: Diagnosis not present

## 2022-12-28 DIAGNOSIS — E785 Hyperlipidemia, unspecified: Secondary | ICD-10-CM | POA: Diagnosis not present

## 2022-12-28 DIAGNOSIS — E1165 Type 2 diabetes mellitus with hyperglycemia: Secondary | ICD-10-CM | POA: Diagnosis not present

## 2022-12-28 DIAGNOSIS — N493 Fournier gangrene: Secondary | ICD-10-CM | POA: Diagnosis present

## 2022-12-28 DIAGNOSIS — E871 Hypo-osmolality and hyponatremia: Secondary | ICD-10-CM | POA: Diagnosis present

## 2022-12-28 DIAGNOSIS — Z801 Family history of malignant neoplasm of trachea, bronchus and lung: Secondary | ICD-10-CM | POA: Diagnosis not present

## 2022-12-28 DIAGNOSIS — E1152 Type 2 diabetes mellitus with diabetic peripheral angiopathy with gangrene: Secondary | ICD-10-CM | POA: Diagnosis present

## 2022-12-28 DIAGNOSIS — K219 Gastro-esophageal reflux disease without esophagitis: Secondary | ICD-10-CM | POA: Diagnosis present

## 2022-12-28 DIAGNOSIS — A419 Sepsis, unspecified organism: Principal | ICD-10-CM | POA: Diagnosis present

## 2022-12-28 DIAGNOSIS — Z1624 Resistance to multiple antibiotics: Secondary | ICD-10-CM | POA: Diagnosis not present

## 2022-12-28 DIAGNOSIS — E876 Hypokalemia: Secondary | ICD-10-CM | POA: Diagnosis not present

## 2022-12-28 DIAGNOSIS — Z8249 Family history of ischemic heart disease and other diseases of the circulatory system: Secondary | ICD-10-CM | POA: Diagnosis not present

## 2022-12-28 DIAGNOSIS — L0390 Cellulitis, unspecified: Secondary | ICD-10-CM | POA: Diagnosis not present

## 2022-12-28 DIAGNOSIS — L02215 Cutaneous abscess of perineum: Secondary | ICD-10-CM | POA: Diagnosis present

## 2022-12-28 DIAGNOSIS — K5900 Constipation, unspecified: Secondary | ICD-10-CM | POA: Diagnosis present

## 2022-12-28 DIAGNOSIS — E7030 Albinism, unspecified: Secondary | ICD-10-CM | POA: Diagnosis not present

## 2022-12-28 DIAGNOSIS — F17210 Nicotine dependence, cigarettes, uncomplicated: Secondary | ICD-10-CM | POA: Diagnosis not present

## 2022-12-28 DIAGNOSIS — Z794 Long term (current) use of insulin: Secondary | ICD-10-CM | POA: Diagnosis not present

## 2022-12-28 LAB — CBC WITH DIFFERENTIAL/PLATELET
Abs Immature Granulocytes: 0.07 10*3/uL (ref 0.00–0.07)
Basophils Absolute: 0 10*3/uL (ref 0.0–0.1)
Basophils Relative: 1 %
Eosinophils Absolute: 0 10*3/uL (ref 0.0–0.5)
Eosinophils Relative: 1 %
HCT: 38.2 % — ABNORMAL LOW (ref 39.0–52.0)
Hemoglobin: 13.3 g/dL (ref 13.0–17.0)
Immature Granulocytes: 2 %
Lymphocytes Relative: 2 %
Lymphs Abs: 0.1 10*3/uL — ABNORMAL LOW (ref 0.7–4.0)
MCH: 27.5 pg (ref 26.0–34.0)
MCHC: 34.8 g/dL (ref 30.0–36.0)
MCV: 78.9 fL — ABNORMAL LOW (ref 80.0–100.0)
Monocytes Absolute: 0.4 10*3/uL (ref 0.1–1.0)
Monocytes Relative: 9 %
Neutro Abs: 3.6 10*3/uL (ref 1.7–7.7)
Neutrophils Relative %: 85 %
Platelets: 116 10*3/uL — ABNORMAL LOW (ref 150–400)
RBC: 4.84 MIL/uL (ref 4.22–5.81)
RDW: 11.6 % (ref 11.5–15.5)
WBC: 4.2 10*3/uL (ref 4.0–10.5)
nRBC: 0 % (ref 0.0–0.2)

## 2022-12-28 LAB — COMPREHENSIVE METABOLIC PANEL
ALT: 124 U/L — ABNORMAL HIGH (ref 0–44)
ALT: 121 U/L — ABNORMAL HIGH (ref 0–44)
AST: 97 U/L — ABNORMAL HIGH (ref 15–41)
AST: 94 U/L — ABNORMAL HIGH (ref 15–41)
Albumin: 2.8 g/dL — ABNORMAL LOW (ref 3.5–5.0)
Albumin: 2.8 g/dL — ABNORMAL LOW (ref 3.5–5.0)
Alkaline Phosphatase: 233 U/L — ABNORMAL HIGH (ref 38–126)
Alkaline Phosphatase: 232 U/L — ABNORMAL HIGH (ref 38–126)
Anion gap: 11 (ref 5–15)
Anion gap: 10 (ref 5–15)
BUN: 14 mg/dL (ref 6–20)
BUN: 15 mg/dL (ref 6–20)
CO2: 23 mmol/L (ref 22–32)
CO2: 22 mmol/L (ref 22–32)
Calcium: 8.1 mg/dL — ABNORMAL LOW (ref 8.9–10.3)
Calcium: 8 mg/dL — ABNORMAL LOW (ref 8.9–10.3)
Chloride: 84 mmol/L — ABNORMAL LOW (ref 98–111)
Chloride: 85 mmol/L — ABNORMAL LOW (ref 98–111)
Creatinine, Ser: 1.01 mg/dL (ref 0.61–1.24)
Creatinine, Ser: 0.96 mg/dL (ref 0.61–1.24)
GFR, Estimated: >60 mL/min (ref >60)
GFR, Estimated: >60 mL/min (ref >60)
Glucose, Bld: 318 mg/dL — ABNORMAL HIGH (ref 70–99)
Glucose, Bld: 310 mg/dL — ABNORMAL HIGH (ref 70–99)
Potassium: 3.7 mmol/L (ref 3.5–5.1)
Potassium: 3.8 mmol/L (ref 3.5–5.1)
Sodium: 118 mmol/L — CL (ref 135–145)
Sodium: 117 mmol/L — CL (ref 135–145)
Total Bilirubin: 3 mg/dL — ABNORMAL HIGH (ref 0.3–1.2)
Total Bilirubin: 2.9 mg/dL — ABNORMAL HIGH (ref 0.3–1.2)
Total Protein: 6.7 g/dL (ref 6.5–8.1)
Total Protein: 6.6 g/dL (ref 6.5–8.1)

## 2022-12-28 LAB — LACTIC ACID, PLASMA: Lactic Acid, Venous: 1.8 mmol/L (ref 0.5–1.9)

## 2022-12-28 MED ORDER — PIPERACILLIN-TAZOBACTAM 3.375 G IVPB 30 MIN
3.3750 g | Freq: Once | INTRAVENOUS | Status: AC
  Administered 2022-12-28: 3.375 g via INTRAVENOUS
  Filled 2022-12-28: qty 50

## 2022-12-28 MED ORDER — VANCOMYCIN HCL IN DEXTROSE 1-5 GM/200ML-% IV SOLN
1000.0000 mg | Freq: Once | INTRAVENOUS | Status: AC
  Administered 2022-12-28: 1000 mg via INTRAVENOUS
  Filled 2022-12-28: qty 200

## 2022-12-28 MED ORDER — ONDANSETRON HCL 4 MG/2ML IJ SOLN
4.0000 mg | Freq: Once | INTRAMUSCULAR | Status: AC
  Administered 2022-12-28: 4 mg via INTRAVENOUS
  Filled 2022-12-28: qty 2

## 2022-12-28 MED ORDER — IOHEXOL 300 MG/ML  SOLN: 75.0000 mL | Freq: Once | INTRAMUSCULAR | Status: DC | PRN

## 2022-12-28 MED ORDER — IOHEXOL 300 MG/ML  SOLN
100.0000 mL | Freq: Once | INTRAMUSCULAR | Status: AC | PRN
  Administered 2022-12-28: 100 mL via INTRAVENOUS

## 2022-12-28 MED ORDER — SODIUM CHLORIDE 0.9 % IV BOLUS
1000.0000 mL | Freq: Once | INTRAVENOUS | Status: AC
  Administered 2022-12-28: 1000 mL via INTRAVENOUS

## 2022-12-28 MED ORDER — MORPHINE SULFATE (PF) 4 MG/ML IV SOLN
4.0000 mg | Freq: Once | INTRAVENOUS | Status: AC
  Administered 2022-12-28: 4 mg via INTRAVENOUS
  Filled 2022-12-28: qty 1

## 2022-12-28 MED ORDER — PROPOFOL 10 MG/ML IV BOLUS
INTRAVENOUS | Status: AC
  Filled 2022-12-28: qty 20

## 2022-12-28 NOTE — ED Triage Notes (Signed)
Pt to ed from home via POV for abscess on his gentials. Pt was seen 3 days ago for same. They placed him on abx and pt states "Those meds aint helping me. I need this to be gone now". Pt is obviously uncomfortable in triage. Pt is caox4, and in a wheel chair in triage. Pt is taking pain pills as well.

## 2022-12-28 NOTE — ED Provider Notes (Signed)
[facility] Provider Note    Event Date/Time   First MD Initiated Contact with Patient 12/28/22 2043     (approximate)   History   Abscess (genitals)   HPI  Steve [name] is a 42 y.o. male history of diabetes, hypertension presents emergency department with worsening abscess on his genitals.  States that he has been on Septra for several days.  States he is becoming more uncomfortable.  States that he just push a pin into it to alleviate the swelling he would feel much better.  Denies fever or chills this time.      Physical Exam   Triage Vital Signs: ED Triage Vitals  Encounter Vitals Group     BP 12/28/22 2038 128/75     Systolic BP Percentile --      Diastolic BP Percentile --      Pulse Rate 12/28/22 2038 100     Resp 12/28/22 2038 16     Temp 12/28/22 2038 98.8 F (37.1 C)     Temp Source 12/28/22 2038 Oral     SpO2 12/28/22 2038 99 %     Weight 12/28/22 2039 235 lb 14.3 oz (107 kg)     Height 12/28/22 2039 6\' 1"  (1.854 m)     Head Circumference --      Peak Flow --      Pain Score 12/28/22 2039 10     Pain Loc --      Pain Education --      Exclude from Growth Chart --     Most recent vital signs: Vitals:   12/28/22 2038 12/28/22 2150  BP: 128/75 139/70  Pulse: 100 93  Resp: 16 16  Temp: 98.8 F (37.1 C)   SpO2: 99% 94%     General: Awake, no distress.   CV:  Good peripheral perfusion. regular rate and  rhythm Resp:  Normal effort.  Abd:  No distention.   Other:  Scrotum is bright red swollen, swelling noted into the perineum, area is very tender to palpation, no gangrene is noted at this time   ED Results / Procedures / Treatments   Labs (all labs ordered are listed, but only abnormal results are displayed) Labs Reviewed  COMPREHENSIVE METABOLIC PANEL - Abnormal; Notable for the following components:      Result Value   Sodium 118 (*)    Chloride 84 (*)    Glucose, Bld 318 (*)    Calcium 8.1 (*)    Albumin  2.8 (*)    AST 97 (*)    ALT 124 (*)    Alkaline Phosphatase 233 (*)    Total Bilirubin 3.0 (*)    All other components within normal limits  CBC WITH DIFFERENTIAL/PLATELET - Abnormal; Notable for the following components:   HCT 38.2 (*)    MCV 78.9 (*)    Platelets 116 (*)    Lymphs Abs 0.1 (*)    All other components within normal limits  LACTIC ACID, PLASMA  LACTIC ACID, PLASMA  COMPREHENSIVE METABOLIC PANEL     EKG     RADIOLOGY     PROCEDURES:   Procedures   MEDICATIONS ORDERED IN ED: Medications  piperacillin-tazobactam (ZOSYN) IVPB 3.375 g (3.375 g Intravenous New Bag/Given 12/28/22 2212)  vancomycin (VANCOCIN) IVPB 1000 mg/200 mL premix (has no administration in time range)  morphine (PF) 4 MG/ML injection 4 mg (4 mg Intravenous Given 12/28/22 2133)  ondansetron (ZOFRAN) injection 4 mg (4 mg  Intravenous Given 12/28/22 2135)  iohexol (OMNIPAQUE) 300 MG/ML solution 100 mL (100 mLs Intravenous Contrast Given 12/28/22 2224)     IMPRESSION / MDM / ASSESSMENT AND PLAN / ED COURSE  I reviewed the triage vital signs and the nursing notes.                              Differential diagnosis includes, but is not limited to, Fournier's disease, Fournier's gangrene, cellulitis  Patient's presentation is most consistent with acute presentation with potential threat to life or bodily function.   Labs ordered, spoke to charge nurse and physicians on Main.  Patient be pushed to the main due to the seriousness of the infection Charge nurse notified me that he is going to room 8 IV was initiated by nursing staff, morphine 4 mg IV, Zofran 4 mg IV for pain   Patient moved to major side in stable condition, discussed with Dr. Derrill Kay   FINAL CLINICAL IMPRESSION(S) / ED DIAGNOSES   Final diagnoses:  Cellulitis of perineum     Rx / DC Orders   ED Discharge Orders     None        Note:  This document was prepared using Dragon voice recognition software and may  include unintentional dictation errors.    Faythe Ghee, PA-C 12/28/22 2229    Phineas Semen, MD 12/29/22 [phone]

## 2022-12-28 NOTE — ED Notes (Signed)
Transfer of care report given over telephone to [facility].  Pt Dx, Hx, condition, lines, labs, and procedure discussed.  All questions asked were answered.    RN came from OR to transport patient to OR.

## 2022-12-29 ENCOUNTER — Encounter
Admission: EM | Disposition: A | Discharge status: Home Health | Payer: Self-pay | Source: Home / Self Care | Attending: Internal Medicine

## 2022-12-29 ENCOUNTER — Other Ambulatory Visit: Payer: Self-pay

## 2022-12-29 ENCOUNTER — Emergency Department: Payer: Self-pay | Admitting: Certified Registered"

## 2022-12-29 ENCOUNTER — Emergency Department: Payer: 59 | Admitting: Certified Registered"

## 2022-12-29 ENCOUNTER — Encounter: Payer: Self-pay | Admitting: Anesthesiology

## 2022-12-29 DIAGNOSIS — Z833 Family history of diabetes mellitus: Secondary | ICD-10-CM | POA: Diagnosis not present

## 2022-12-29 DIAGNOSIS — M726 Necrotizing fasciitis: Secondary | ICD-10-CM | POA: Diagnosis present

## 2022-12-29 DIAGNOSIS — L03315 Cellulitis of perineum: Secondary | ICD-10-CM | POA: Diagnosis present

## 2022-12-29 DIAGNOSIS — E7030 Albinism, unspecified: Secondary | ICD-10-CM | POA: Diagnosis present

## 2022-12-29 DIAGNOSIS — Z79899 Other long term (current) drug therapy: Secondary | ICD-10-CM | POA: Diagnosis not present

## 2022-12-29 DIAGNOSIS — K219 Gastro-esophageal reflux disease without esophagitis: Secondary | ICD-10-CM | POA: Insufficient documentation

## 2022-12-29 DIAGNOSIS — A419 Sepsis, unspecified organism: Secondary | ICD-10-CM | POA: Diagnosis present

## 2022-12-29 DIAGNOSIS — K5900 Constipation, unspecified: Secondary | ICD-10-CM | POA: Diagnosis present

## 2022-12-29 DIAGNOSIS — L0390 Cellulitis, unspecified: Secondary | ICD-10-CM | POA: Diagnosis not present

## 2022-12-29 DIAGNOSIS — N493 Fournier gangrene: Secondary | ICD-10-CM | POA: Diagnosis present

## 2022-12-29 DIAGNOSIS — Z8249 Family history of ischemic heart disease and other diseases of the circulatory system: Secondary | ICD-10-CM | POA: Diagnosis not present

## 2022-12-29 DIAGNOSIS — Z801 Family history of malignant neoplasm of trachea, bronchus and lung: Secondary | ICD-10-CM | POA: Diagnosis not present

## 2022-12-29 DIAGNOSIS — E785 Hyperlipidemia, unspecified: Secondary | ICD-10-CM | POA: Diagnosis present

## 2022-12-29 DIAGNOSIS — E1152 Type 2 diabetes mellitus with diabetic peripheral angiopathy with gangrene: Secondary | ICD-10-CM | POA: Diagnosis present

## 2022-12-29 DIAGNOSIS — E871 Hypo-osmolality and hyponatremia: Secondary | ICD-10-CM

## 2022-12-29 DIAGNOSIS — I10 Essential (primary) hypertension: Secondary | ICD-10-CM | POA: Insufficient documentation

## 2022-12-29 DIAGNOSIS — B9620 Unspecified Escherichia coli [E. coli] as the cause of diseases classified elsewhere: Secondary | ICD-10-CM | POA: Diagnosis present

## 2022-12-29 DIAGNOSIS — Z7985 Long-term (current) use of injectable non-insulin antidiabetic drugs: Secondary | ICD-10-CM | POA: Diagnosis not present

## 2022-12-29 DIAGNOSIS — L02215 Cutaneous abscess of perineum: Secondary | ICD-10-CM | POA: Diagnosis present

## 2022-12-29 DIAGNOSIS — E1165 Type 2 diabetes mellitus with hyperglycemia: Secondary | ICD-10-CM

## 2022-12-29 DIAGNOSIS — Z1624 Resistance to multiple antibiotics: Secondary | ICD-10-CM | POA: Diagnosis present

## 2022-12-29 DIAGNOSIS — E876 Hypokalemia: Secondary | ICD-10-CM | POA: Diagnosis present

## 2022-12-29 DIAGNOSIS — Z808 Family history of malignant neoplasm of other organs or systems: Secondary | ICD-10-CM | POA: Diagnosis not present

## 2022-12-29 DIAGNOSIS — Z794 Long term (current) use of insulin: Secondary | ICD-10-CM | POA: Diagnosis not present

## 2022-12-29 DIAGNOSIS — Z5986 Financial insecurity: Secondary | ICD-10-CM | POA: Diagnosis not present

## 2022-12-29 DIAGNOSIS — F17210 Nicotine dependence, cigarettes, uncomplicated: Secondary | ICD-10-CM | POA: Diagnosis present

## 2022-12-29 HISTORY — PX: INCISION AND DRAINAGE ABSCESS: SHX5864

## 2022-12-29 LAB — GLUCOSE, CAPILLARY
Glucose-Capillary: 295 mg/dL — ABNORMAL HIGH (ref 70–99)
Glucose-Capillary: 344 mg/dL — ABNORMAL HIGH (ref 70–99)
Glucose-Capillary: 261 mg/dL — ABNORMAL HIGH (ref 70–99)
Glucose-Capillary: 170 mg/dL — ABNORMAL HIGH (ref 70–99)
Glucose-Capillary: 117 mg/dL — ABNORMAL HIGH (ref 70–99)
Glucose-Capillary: 163 mg/dL — ABNORMAL HIGH (ref 70–99)
Glucose-Capillary: 161 mg/dL — ABNORMAL HIGH (ref 70–99)

## 2022-12-29 LAB — BASIC METABOLIC PANEL
Anion gap: 10 (ref 5–15)
BUN: 17 mg/dL (ref 6–20)
CO2: 23 mmol/L (ref 22–32)
Calcium: 7.9 mg/dL — ABNORMAL LOW (ref 8.9–10.3)
Chloride: 88 mmol/L — ABNORMAL LOW (ref 98–111)
Creatinine, Ser: 1.23 mg/dL (ref 0.61–1.24)
GFR, Estimated: >60 mL/min (ref >60)
Glucose, Bld: 261 mg/dL — ABNORMAL HIGH (ref 70–99)
Potassium: 3.6 mmol/L (ref 3.5–5.1)
Sodium: 121 mmol/L — ABNORMAL LOW (ref 135–145)

## 2022-12-29 LAB — CBC
HCT: 34.7 % — ABNORMAL LOW (ref 39.0–52.0)
Hemoglobin: 12.1 g/dL — ABNORMAL LOW (ref 13.0–17.0)
MCH: 27.8 pg (ref 26.0–34.0)
MCHC: 34.9 g/dL (ref 30.0–36.0)
MCV: 79.8 fL — ABNORMAL LOW (ref 80.0–100.0)
Platelets: 110 10*3/uL — ABNORMAL LOW (ref 150–400)
RBC: 4.35 MIL/uL (ref 4.22–5.81)
RDW: 11.9 % (ref 11.5–15.5)
WBC: 4.5 10*3/uL (ref 4.0–10.5)
nRBC: 0 % (ref 0.0–0.2)

## 2022-12-29 LAB — SODIUM
Sodium: 122 mmol/L — ABNORMAL LOW (ref 135–145)
Sodium: 123 mmol/L — ABNORMAL LOW (ref 135–145)
Sodium: 124 mmol/L — ABNORMAL LOW (ref 135–145)
Sodium: 123 mmol/L — ABNORMAL LOW (ref 135–145)

## 2022-12-29 LAB — OSMOLALITY: Osmolality: 267 mOsm/kg — ABNORMAL LOW (ref 275–295)

## 2022-12-29 LAB — NA AND K (SODIUM & POTASSIUM), RAND UR
Potassium Urine: 26 mmol/L
Sodium, Ur: <10 mmol/L

## 2022-12-29 LAB — HIV ANTIBODY (ROUTINE TESTING W REFLEX): HIV Screen 4th Generation wRfx: NONREACTIVE

## 2022-12-29 LAB — OSMOLALITY, URINE: Osmolality, Ur: 488 mOsm/kg (ref 300–900)

## 2022-12-29 LAB — CORTISOL-AM, BLOOD: Cortisol - AM: 31.3 ug/dL — ABNORMAL HIGH (ref 6.7–22.6)

## 2022-12-29 LAB — MRSA NEXT GEN BY PCR, NASAL: MRSA by PCR Next Gen: NOT DETECTED

## 2022-12-29 LAB — PROCALCITONIN: Procalcitonin: 22.65 ng/mL

## 2022-12-29 LAB — PROTIME-INR
INR: 1 (ref 0.8–1.2)
Prothrombin Time: 13.5 seconds (ref 11.4–15.2)

## 2022-12-29 LAB — TSH: TSH: 1.859 u[IU]/mL (ref 0.350–4.500)

## 2022-12-29 SURGERY — INCISION AND DRAINAGE, ABSCESS
Anesthesia: General

## 2022-12-29 MED ORDER — ONDANSETRON HCL 4 MG/2ML IJ SOLN
INTRAMUSCULAR | Status: DC | PRN
  Administered 2022-12-29: 4 mg via INTRAVENOUS

## 2022-12-29 MED ORDER — INSULIN GLARGINE-YFGN 100 UNIT/ML ~~LOC~~ SOLN
20.0000 [IU] | Freq: Every day | SUBCUTANEOUS | Status: DC
  Administered 2022-12-29 – 2022-12-31 (×3): 20 [IU] via SUBCUTANEOUS
  Filled 2022-12-29 (×3): qty 0.2

## 2022-12-29 MED ORDER — DEXMEDETOMIDINE HCL IN NACL 80 MCG/20ML IV SOLN
INTRAVENOUS | Status: DC | PRN
  Administered 2022-12-29: 12 ug via INTRAVENOUS
  Administered 2022-12-29 (×2): 8 ug via INTRAVENOUS
  Administered 2022-12-29: 12 ug via INTRAVENOUS

## 2022-12-29 MED ORDER — VANCOMYCIN HCL IN DEXTROSE 1-5 GM/200ML-% IV SOLN: 1000.0000 mg | Freq: Once | INTRAVENOUS | Status: DC

## 2022-12-29 MED ORDER — SUCCINYLCHOLINE CHLORIDE 200 MG/10ML IV SOSY
PREFILLED_SYRINGE | INTRAVENOUS | Status: DC | PRN
  Administered 2022-12-29: 120 mg via INTRAVENOUS

## 2022-12-29 MED ORDER — SODIUM CHLORIDE 0.9 % IV SOLN: INTRAVENOUS | Status: DC

## 2022-12-29 MED ORDER — CHLORHEXIDINE GLUCONATE CLOTH 2 % EX PADS
6.0000 | MEDICATED_PAD | Freq: Every day | CUTANEOUS | Status: DC
  Administered 2022-12-29 – 2022-12-31 (×3): 6 via TOPICAL

## 2022-12-29 MED ORDER — VANCOMYCIN HCL 1250 MG/250ML IV SOLN
1250.0000 mg | Freq: Three times a day (TID) | INTRAVENOUS | Status: DC
  Administered 2022-12-29: 1250 mg via INTRAVENOUS
  Filled 2022-12-29 (×2): qty 250

## 2022-12-29 MED ORDER — INSULIN ASPART 100 UNIT/ML IJ SOLN
8.0000 [IU] | Freq: Three times a day (TID) | INTRAMUSCULAR | Status: DC
  Administered 2022-12-29: 8 [IU] via SUBCUTANEOUS
  Filled 2022-12-29: qty 1

## 2022-12-29 MED ORDER — PIPERACILLIN-TAZOBACTAM 3.375 G IVPB
3.3750 g | Freq: Three times a day (TID) | INTRAVENOUS | Status: DC
  Administered 2022-12-29 – 2023-01-03 (×16): 3.375 g via INTRAVENOUS
  Filled 2022-12-29 (×16): qty 50

## 2022-12-29 MED ORDER — ACETAMINOPHEN 325 MG PO TABS
650.0000 mg | ORAL_TABLET | Freq: Four times a day (QID) | ORAL | Status: DC | PRN
  Administered 2022-12-29 (×2): 650 mg via ORAL
  Filled 2022-12-29 (×2): qty 2

## 2022-12-29 MED ORDER — PROPOFOL 10 MG/ML IV BOLUS
INTRAVENOUS | Status: AC
  Filled 2022-12-29: qty 20

## 2022-12-29 MED ORDER — PIPERACILLIN-TAZOBACTAM 3.375 G IVPB 30 MIN
3.3750 g | Freq: Four times a day (QID) | INTRAVENOUS | Status: DC
Start: 2022-12-29 — End: 2022-12-29

## 2022-12-29 MED ORDER — LIDOCAINE HCL (CARDIAC) PF 100 MG/5ML IV SOSY
PREFILLED_SYRINGE | INTRAVENOUS | Status: DC | PRN
  Administered 2022-12-29: 100 mg via INTRAVENOUS

## 2022-12-29 MED ORDER — FENTANYL CITRATE (PF) 100 MCG/2ML IJ SOLN: 25.0000 ug | INTRAMUSCULAR | Status: DC | PRN

## 2022-12-29 MED ORDER — MIDAZOLAM HCL 2 MG/2ML IJ SOLN
INTRAMUSCULAR | Status: AC
  Filled 2022-12-29: qty 2

## 2022-12-29 MED ORDER — VANCOMYCIN HCL 1500 MG/300ML IV SOLN
1500.0000 mg | Freq: Two times a day (BID) | INTRAVENOUS | Status: DC
  Filled 2022-12-29: qty 300

## 2022-12-29 MED ORDER — INSULIN ASPART 100 UNIT/ML IJ SOLN: 0.0000 [IU] | Freq: Three times a day (TID) | INTRAMUSCULAR | Status: DC

## 2022-12-29 MED ORDER — ONDANSETRON HCL 4 MG/2ML IJ SOLN: 4.0000 mg | Freq: Four times a day (QID) | INTRAMUSCULAR | Status: DC | PRN

## 2022-12-29 MED ORDER — LINEZOLID 600 MG/300ML IV SOLN
600.0000 mg | Freq: Two times a day (BID) | INTRAVENOUS | Status: DC
  Administered 2022-12-29 – 2023-01-02 (×9): 600 mg via INTRAVENOUS
  Filled 2022-12-29 (×9): qty 300

## 2022-12-29 MED ORDER — MAGNESIUM HYDROXIDE 400 MG/5ML PO SUSP: 30.0000 mL | Freq: Every day | ORAL | Status: DC | PRN

## 2022-12-29 MED ORDER — KETOROLAC TROMETHAMINE 15 MG/ML IJ SOLN
15.0000 mg | Freq: Four times a day (QID) | INTRAMUSCULAR | Status: AC | PRN
  Administered 2022-12-29 – 2023-01-03 (×16): 15 mg via INTRAVENOUS
  Filled 2022-12-29 (×16): qty 1

## 2022-12-29 MED ORDER — PROPOFOL 10 MG/ML IV BOLUS
INTRAVENOUS | Status: DC | PRN
  Administered 2022-12-29: 50 mg via INTRAVENOUS
  Administered 2022-12-29: 150 mg via INTRAVENOUS

## 2022-12-29 MED ORDER — ACETAMINOPHEN 650 MG RE SUPP: 650.0000 mg | Freq: Four times a day (QID) | RECTAL | Status: DC | PRN

## 2022-12-29 MED ORDER — FENTANYL CITRATE (PF) 100 MCG/2ML IJ SOLN
INTRAMUSCULAR | Status: AC
  Filled 2022-12-29: qty 2

## 2022-12-29 MED ORDER — DROPERIDOL 2.5 MG/ML IJ SOLN: 0.6250 mg | Freq: Once | INTRAMUSCULAR | Status: DC | PRN

## 2022-12-29 MED ORDER — INSULIN ASPART 100 UNIT/ML IJ SOLN
0.0000 [IU] | Freq: Three times a day (TID) | INTRAMUSCULAR | Status: DC
  Administered 2022-12-29: 15 [IU] via SUBCUTANEOUS
  Administered 2022-12-29: 3 [IU] via SUBCUTANEOUS
  Administered 2022-12-29: 11 [IU] via SUBCUTANEOUS
  Administered 2022-12-29: 4 [IU] via SUBCUTANEOUS
  Administered 2022-12-30: 7 [IU] via SUBCUTANEOUS
  Filled 2022-12-29 (×5): qty 1

## 2022-12-29 MED ORDER — ACETAMINOPHEN 10 MG/ML IV SOLN
INTRAVENOUS | Status: DC | PRN
  Administered 2022-12-29: 1000 mg via INTRAVENOUS

## 2022-12-29 MED ORDER — ACETAMINOPHEN 10 MG/ML IV SOLN
INTRAVENOUS | Status: AC
  Filled 2022-12-29: qty 100

## 2022-12-29 MED ORDER — ROCURONIUM BROMIDE 100 MG/10ML IV SOLN
INTRAVENOUS | Status: DC | PRN
  Administered 2022-12-29: 20 mg via INTRAVENOUS
  Administered 2022-12-29: 30 mg via INTRAVENOUS
  Administered 2022-12-29: 20 mg via INTRAVENOUS

## 2022-12-29 MED ORDER — ORAL CARE MOUTH RINSE: 15.0000 mL | OROMUCOSAL | Status: DC | PRN

## 2022-12-29 MED ORDER — TRAZODONE HCL 50 MG PO TABS
25.0000 mg | ORAL_TABLET | Freq: Every evening | ORAL | Status: DC | PRN
  Filled 2022-12-29: qty 1

## 2022-12-29 MED ORDER — ONDANSETRON HCL 4 MG PO TABS: 4.0000 mg | ORAL_TABLET | Freq: Four times a day (QID) | ORAL | Status: DC | PRN

## 2022-12-29 MED ORDER — SODIUM CHLORIDE 0.9 % IV SOLN: INTRAVENOUS | Status: DC | PRN

## 2022-12-29 MED ORDER — FENTANYL CITRATE PF 50 MCG/ML IJ SOSY
25.0000 ug | PREFILLED_SYRINGE | INTRAMUSCULAR | Status: DC | PRN
  Administered 2022-12-31 – 2023-01-05 (×6): 25 ug via INTRAVENOUS
  Filled 2022-12-29 (×7): qty 1

## 2022-12-29 MED ORDER — ENOXAPARIN SODIUM 60 MG/0.6ML IJ SOSY
50.0000 mg | PREFILLED_SYRINGE | INTRAMUSCULAR | Status: DC
  Administered 2022-12-29 – 2023-01-05 (×8): 50 mg via SUBCUTANEOUS
  Filled 2022-12-29 (×8): qty 0.6

## 2022-12-29 MED ORDER — FENTANYL CITRATE (PF) 100 MCG/2ML IJ SOLN
INTRAMUSCULAR | Status: DC | PRN
  Administered 2022-12-29: 100 ug via INTRAVENOUS

## 2022-12-29 MED ORDER — SUGAMMADEX SODIUM 200 MG/2ML IV SOLN
INTRAVENOUS | Status: DC | PRN
  Administered 2022-12-29: 200 mg via INTRAVENOUS

## 2022-12-29 SURGICAL SUPPLY — 43 items
ADH SKN CLS APL DERMABOND .7 (GAUZE/BANDAGES/DRESSINGS) ×1
APL PRP STRL LF DISP 70% ISPRP (MISCELLANEOUS) ×1
BAG DRN RND TRDRP ANRFLXCHMBR (UROLOGICAL SUPPLIES) ×1
BAG URINE DRAIN 2000ML AR STRL (UROLOGICAL SUPPLIES) IMPLANT
BNDG CMPR 75X21 PLY HI ABS (MISCELLANEOUS) ×1
CATH FOLEY 2WAY 5CC 16FR (CATHETERS) ×1
CATH URTH 16FR FL 2W BLN LF (CATHETERS) IMPLANT
CHLORAPREP W/TINT 26 (MISCELLANEOUS) ×1 IMPLANT
DERMABOND ADVANCED .7 DNX12 (GAUZE/BANDAGES/DRESSINGS) ×1 IMPLANT
DRAIN PENROSE 12X.25 LTX STRL (MISCELLANEOUS) ×1 IMPLANT
DRAPE LAPAROTOMY 77X122 PED (DRAPES) ×1 IMPLANT
DRSG GAUZE FLUFF 36X18 (GAUZE/BANDAGES/DRESSINGS) ×1 IMPLANT
ELECT REM PT RETURN 9FT ADLT (ELECTROSURGICAL) ×1
ELECTRODE REM PT RTRN 9FT ADLT (ELECTROSURGICAL) ×1 IMPLANT
GAUZE 4X4 16PLY ~~LOC~~+RFID DBL (SPONGE) ×1 IMPLANT
GAUZE SPONGE 4X4 12PLY STRL (GAUZE/BANDAGES/DRESSINGS) ×1 IMPLANT
GAUZE STRETCH 2X75IN STRL (MISCELLANEOUS) ×1 IMPLANT
GLOVE BIO SURGEON STRL SZ 6.5 (GLOVE) ×1 IMPLANT
GLOVE BIO SURGEON STRL SZ7 (GLOVE) ×1 IMPLANT
GOWN STRL REUS W/ TWL LRG LVL3 (GOWN DISPOSABLE) ×2 IMPLANT
GOWN STRL REUS W/TWL LRG LVL3 (GOWN DISPOSABLE) ×2
KIT TURNOVER KIT A (KITS) ×1 IMPLANT
LABEL OR SOLS (LABEL) ×1 IMPLANT
MANIFOLD NEPTUNE II (INSTRUMENTS) ×1 IMPLANT
NDL HYPO 25X1 1.5 SAFETY (NEEDLE) ×1 IMPLANT
NEEDLE HYPO 25X1 1.5 SAFETY (NEEDLE) ×1 IMPLANT
NS IRRIG 500ML POUR BTL (IV SOLUTION) ×1 IMPLANT
PACK BASIN MINOR ARMC (MISCELLANEOUS) ×1 IMPLANT
SOL PREP PVP 2OZ (MISCELLANEOUS) ×1
SOLUTION PREP PVP 2OZ (MISCELLANEOUS) ×1 IMPLANT
SPONGE T-LAP 18X18 ~~LOC~~+RFID (SPONGE) IMPLANT
SUPPORETR ATHLETIC LG (MISCELLANEOUS) ×1 IMPLANT
SUPPORTER ATHLETIC LG (MISCELLANEOUS) ×1
SURGILUBE 2OZ TUBE FLIPTOP (MISCELLANEOUS) IMPLANT
SUT ETHILON 3-0 FS-10 30 BLK (SUTURE) ×1
SUT VIC AB 3-0 SH 27 (SUTURE) ×2
SUT VIC AB 3-0 SH 27X BRD (SUTURE) ×2 IMPLANT
SUT VIC AB 4-0 SH 27 (SUTURE) ×1
SUT VIC AB 4-0 SH 27XANBCTRL (SUTURE) ×1 IMPLANT
SUTURE EHLN 3-0 FS-10 30 BLK (SUTURE) ×1 IMPLANT
SYR 10ML LL (SYRINGE) ×1 IMPLANT
TRAP FLUID SMOKE EVACUATOR (MISCELLANEOUS) ×1 IMPLANT
WATER STERILE IRR 500ML POUR (IV SOLUTION) ×1 IMPLANT

## 2022-12-29 NOTE — ED Provider Notes (Signed)
CT pelvis concerning for gas in the scrotum. Discussed with Dr. Alvester Morin with urology who evaluaed the patient and will plan on taking to the OR.   Phineas Semen, MD 12/29/22 [phone]

## 2022-12-29 NOTE — Assessment & Plan Note (Signed)
-   We will continue PPI therapy 

## 2022-12-29 NOTE — Progress Notes (Signed)
[facility]  - [place] [facility]   PATIENT NAME: Steve [name]    MR#:  [id number]  DATE OF BIRTH:  1980/06/09  SUBJECTIVE:  no family at bedside. Patient came in with redness and pain over the perineal/scrotal area. Had a phone call few days ago. Was taken to the OR for necrotizing fasciitis/fournier's gangrene Fever today Toerating some po diet   VITALS:  Blood pressure 104/63, pulse 86, temperature 99.2 F (37.3 C), temperature source Oral, resp. rate (!) 21, height 6\' 1"  (1.854 m), weight 107 kg, SpO2 96%.  PHYSICAL EXAMINATION:   GENERAL:  42 y.o.-year-old patient with no acute distress.  LUNGS: Normal breath sounds bilaterally, no wheezing CARDIOVASCULAR: S1, S2 normal. No murmur   ABDOMEN: Soft, nontender, nondistended. Bowel sounds present. Packing + in the perineal area  FOLEY+ EXTREMITIES: No  edema b/l.    NEUROLOGIC: nonfocal  patient is alert and awake  LABORATORY PANEL:  CBC Recent Labs  Lab 12/29/22 0605  WBC 4.5  HGB 12.1*  HCT 34.7*  PLT 110*    Chemistries  Recent Labs  Lab 12/28/22 2206 12/29/22 0605 12/29/22 0855  NA 117* 121* 122*  K 3.8 3.6  --   CL 85* 88*  --   CO2 22 23  --   GLUCOSE 310* 261*  --   BUN 15 17  --   CREATININE 0.96 1.23  --   CALCIUM 8.0* 7.9*  --   AST 94*  --   --   ALT 121*  --   --   ALKPHOS 232*  --   --   BILITOT 2.9*  --   --    Cardiac Enzymes No results for input(s): "TROPONINI" in the last 168 hours. RADIOLOGY:  CT PELVIS W CONTRAST  Result Date: 12/28/2022 CLINICAL DATA:  Abscess on genitals. EXAM: CT PELVIS WITH CONTRAST TECHNIQUE: Multidetector CT imaging of the pelvis was performed using the standard protocol following the bolus administration of intravenous contrast. RADIATION DOSE REDUCTION: This exam was performed according to the departmental dose-optimization program which includes automated exposure control, adjustment of the mA and/or kV according to patient size and/or  use of iterative reconstruction technique. CONTRAST:  OMNIPAQUE IOHEXOL 300 MG/ML  SOLN COMPARISON:  CT pelvis 12/25/2022 FINDINGS: Urinary Tract:  No abnormality visualized. Bowel:  Unremarkable visualized pelvic bowel loops. Vascular/Lymphatic: Reactive inguinal lymph nodes. No significant vascular abnormality seen. Reproductive: Redemonstrated soft tissue stranding on the right greater than left perineum. This extends into the scrotum where there is skin thickening greater on the right. There is new soft tissue gas within the posterior scrotum. Right-greater-than-left small hydroceles. No organized abscess. Other:  No free intraperitoneal fluid or air. Musculoskeletal: No acute osseous abnormality. IMPRESSION: Constellation of findings are concerning for necrotizing infection the scrotum. No organized abscess. Critical Value/emergent results were called by telephone at the time of interpretation on 12/28/2022 at 10:48 pm to provider [facility] , who verbally acknowledged these results. Electronically Signed   By: Minerva Fester M.D.   On: 12/28/2022 22:57    Assessment and Plan  Steve [name] is a 42 y.o. Caucasian male with medical history significant for type 2 diabetes mellitus, GERD, essential hypertension and gastritis, presented to the emergency room with acute onset of worsening abscess on his genitals for which he has been seen in the ER couple of days ago and placed on Septra for so far without improvement.  It has become more uncomfortable and more swollen.  He stated that he pushed the hair pin into it to alleviate the swelling and increased purulent drainage so that he will feel better   CT pelvis with contrast showed Constellation of findings are concerning for necrotizing infection the scrotum. No organized abscess.   Fournier's gangrene of scrotum Sepsis POA - s/p emergent post debridement of his fournier's gangrene. -  IV Zosyn and vancomycin--change to IV linezolid +  Zosyn --ID consult in am - BC were not sent from ER - Pain management will be provided. - Urology input with dr Alvester Morin appreciated. May go to OR for wound check tomorrow. NPO after midnight    Severe Hyponatremia --117--118--121--122 - cont hydration with IV normal saline for now and obtain hyponatremia workup. --consider Nephrology input if sodium does not improve --mentation stable  - if worsening levels he may need 3% saline infusion.   Type 2 diabetes mellitus with hyperglycemia, without long-term current use of insulin (HCC) - The patient will be placed on supplement coverage with resistant subcutaneous NovoLog. - semglee +aspart   Dyslipidemia -  continue statin therapy.   Essential hypertension - apparently diet managed and his BP has been fairly controlled.   GERD without esophagitis - continue PPI therapy.    Procedures: I and D perineal area Family communication :pt says family is informed Consults :Urology CODE STATUS:  DVT Prophylaxis : Level of care: Stepdown Status is: Inpatient Remains inpatient appropriate because: sepsis    TOTAL TIME TAKING CARE OF THIS PATIENT: 35 minutes.  >50% time spent on counselling and coordination of care  Note: This dictation was prepared with Dragon dictation along with smaller phrase technology. Any transcriptional errors that result from this process are unintentional.  [name] M.D    [facility]   CC: Primary care physician; Care, [facility]

## 2022-12-29 NOTE — Assessment & Plan Note (Signed)
-   This is manifested by mild tachycardia and tachypnea. - Management as above. - He will be hydrated with IV normal saline.

## 2022-12-29 NOTE — Op Note (Signed)
Operative Note  Preoperative diagnosis:  1.  Fournier's gangrene  Postoperative diagnosis: 1.  Fournier's gangrene of the perineum  Procedure(s): 1.  Excisional debridement of perineum 10 cm x 10 cm  Surgeon: Modena Slater, MD  Consultants: Henrene Dodge  Anesthesia: General  Complications: None immediate  EBL: 40 cc  Specimens: 1.  None  Drains/Catheters: 1.  Foley catheter  Intraoperative findings: Fournier's gangrene of the perineum.  Extensive necrotic tissue subcutaneously.  Indication: 42 year old male presented with perineal and scrotal pain and swelling found to have Fournier's on CT.  Presents for the previously mentioned operation.  Description of procedure:  The patient was identified and consent was obtained.  The patient was taken to the operating room and placed in the supine position.  The patient was placed under general anesthesia.  Perioperative antibiotics were administered.  The patient was placed in dorsal lithotomy.  Patient was prepped and draped in a standard sterile fashion and a timeout was performed.  A 16 French Foley catheter was placed with no resistance and return of clear yellow urine.  He used Bovie electrocautery to make an incision in the peritoneum over the area of fluctuance/crepitus.  The subcutaneous tissue was necrotic.  I extended the incision up to the scrotal junction and down to approximately 5 cm above the anus.  Sharp scissors were used to debride the necrotic tissue.  I excised the diseased skin that was involved bilaterally.  There is no significant pus.  Total area of excision was 10 cm x 10 cm.  All necrotic tissue was debrided.  The scrotum did not appear to be involved.  Testicles appeared viable.  Tissue surrounding the testicles was viable.  There was no crepitus or fluctuance within the scrotum.  I consulted Dr. Aleen Campi who was in agreement that no further debridement inferiorly was necessary.  Adequate hemostasis was obtained.  I  packed the wound with Dakin soaked Kerlix.  This concluded the operation.  Patient tolerated the procedure well was stable postoperatively.  Plan: Continue IV antibiotics.  He will likely need a second look on Monday.

## 2022-12-29 NOTE — Plan of Care (Signed)
  Problem: Education: Goal: Knowledge of General Education information will improve Description: Including pain rating scale, medication(s)/side effects and non-pharmacologic comfort measures Outcome: Progressing   Problem: Health Behavior/Discharge Planning: Goal: Ability to manage health-related needs will improve Outcome: Progressing   Problem: Clinical Measurements: Goal: Ability to maintain clinical measurements within normal limits will improve Outcome: Progressing Goal: Will remain free from infection Outcome: Progressing Goal: Diagnostic test results will improve Outcome: Progressing Goal: Respiratory complications will improve Outcome: Progressing Goal: Cardiovascular complication will be avoided Outcome: Progressing   Problem: Clinical Measurements: Goal: Diagnostic test results will improve Outcome: Progressing

## 2022-12-29 NOTE — Consult Note (Addendum)
H&P Physician requesting consult: Rockney Ghee  Chief Complaint: Concern for Fournier's gangrene  History of Present Illness: 42 year old male with diabetes presenting with severe scrotal pain and discomfort.  He has been on Septra for few days.  Has been becoming more uncomfortable.  Presented to the emergency department where he had a CT scan performed that revealed concern for Fournier's.  Has significant hyponatremia.  No leukocytosis.  Glucose 310.  Past Medical History:  Diagnosis Date   Diabetes mellitus without complication (HCC)    GERD (gastroesophageal reflux disease)    History of gastritis 09/2016   Hypertension    Past Surgical History:  Procedure Laterality Date   COLONOSCOPY WITH PROPOFOL N/A 11/15/2019   Procedure: COLONOSCOPY WITH PROPOFOL WITH POLYPECTOMY, BIOPSIES;  Surgeon: Midge Minium, MD;  Location: [facility];  Service: Endoscopy;  Laterality: N/A;   ESOPHAGOGASTRODUODENOSCOPY (EGD) WITH PROPOFOL N/A 11/15/2019   Procedure: ESOPHAGOGASTRODUODENOSCOPY (EGD) WITH PROPOFOL WITH POLYPECTOMY, BIOPSIES;  Surgeon: Midge Minium, MD;  Location: [facility];  Service: Endoscopy;  Laterality: N/A;   TOOTH EXTRACTION      Home Medications:  (Not in a hospital admission)  Allergies: No Known Allergies  Family History  Problem Relation Age of Onset   Diabetes Brother    Hypertension Brother    Diabetes Maternal Aunt    Hypertension Maternal Aunt    Cancer Maternal Aunt        pancreas   Diabetes Maternal Uncle    Hypertension Maternal Uncle    Cancer Maternal Uncle        bone   Diabetes Maternal Grandmother    Cancer Maternal Grandmother    Diabetes Maternal Grandfather    Cancer Maternal Uncle        lung   COPD Neg Hx    Heart disease Neg Hx    Stroke Neg Hx    Social History:  reports that he has been smoking cigarettes. He has a 5.8 pack-year smoking history. He has never used smokeless tobacco. He reports current drug use. Drug:  Marijuana. He reports that he does not drink alcohol.  ROS: A complete review of systems was performed.  All systems are negative except for pertinent findings as noted. ROS   Physical Exam:  Vital signs in last 24 hours: Temp:  [98.8 F (37.1 C)] 98.8 F (37.1 C) (07/27 2038) Pulse Rate:  [89-100] 93 (07/27 2313) Resp:  [16] 16 (07/27 2313) BP: (128-146)/(70-102) 136/102 (07/27 2313) SpO2:  [94 %-99 %] 96 % (07/27 2313) Weight:  [107 kg] 107 kg (07/27 2039) General:  Alert and oriented, No acute distress HEENT: Normocephalic, atraumatic Neck: No JVD or lymphadenopathy Cardiovascular: Regular rate and rhythm Lungs: Regular rate and effort Abdomen: Soft, nontender, nondistended, no abdominal masses Back: No CVA tenderness Genitourinary: Normal phallus.  Difficult scrotal examination secondary to discomfort.  However, the scrotum overall appeared normal.  He had a large area of fluctuance and tenderness on the left side of perineum beneath the scrotum consistent with large abscess.  No necrotic changes Extremities: No edema Neurologic: Grossly intact  Laboratory Data:  Results for orders placed or performed during the hospital encounter of 12/28/22 (from the past 24 hour(s))  Comprehensive metabolic panel     Status: Abnormal   Collection Time: 12/28/22  9:18 PM  Result Value Ref Range   Sodium 118 (LL) 135 - 145 mmol/L   Potassium 3.7 3.5 - 5.1 mmol/L   Chloride 84 (L) 98 - 111 mmol/L   CO2  23 22 - 32 mmol/L   Glucose, Bld 318 (H) 70 - 99 mg/dL   BUN 14 6 - 20 mg/dL   Creatinine, Ser 5.95 0.61 - 1.24 mg/dL   Calcium 8.1 (L) 8.9 - 10.3 mg/dL   Total Protein 6.7 6.5 - 8.1 g/dL   Albumin 2.8 (L) 3.5 - 5.0 g/dL   AST 97 (H) 15 - 41 U/L   ALT 124 (H) 0 - 44 U/L   Alkaline Phosphatase 233 (H) 38 - 126 U/L   Total Bilirubin 3.0 (H) 0.3 - 1.2 mg/dL   GFR, Estimated >63 >87 mL/min   Anion gap 11 5 - 15  Lactic acid, plasma     Status: None   Collection Time: 12/28/22  9:18 PM   Result Value Ref Range   Lactic Acid, Venous 1.8 0.5 - 1.9 mmol/L  CBC with Differential     Status: Abnormal   Collection Time: 12/28/22  9:18 PM  Result Value Ref Range   WBC 4.2 4.0 - 10.5 K/uL   RBC 4.84 4.22 - 5.81 MIL/uL   Hemoglobin 13.3 13.0 - 17.0 g/dL   HCT 56.4 (L) 33.2 - 95.1 %   MCV 78.9 (L) 80.0 - 100.0 fL   MCH 27.5 26.0 - 34.0 pg   MCHC 34.8 30.0 - 36.0 g/dL   RDW 88.4 16.6 - 06.3 %   Platelets 116 (L) 150 - 400 K/uL   nRBC 0.0 0.0 - 0.2 %   Neutrophils Relative % 85 %   Neutro Abs 3.6 1.7 - 7.7 K/uL   Lymphocytes Relative 2 %   Lymphs Abs 0.1 (L) 0.7 - 4.0 K/uL   Monocytes Relative 9 %   Monocytes Absolute 0.4 0.1 - 1.0 K/uL   Eosinophils Relative 1 %   Eosinophils Absolute 0.0 0.0 - 0.5 K/uL   Basophils Relative 1 %   Basophils Absolute 0.0 0.0 - 0.1 K/uL   Immature Granulocytes 2 %   Abs Immature Granulocytes 0.07 0.00 - 0.07 K/uL  Comprehensive metabolic panel     Status: Abnormal   Collection Time: 12/28/22 10:06 PM  Result Value Ref Range   Sodium 117 (LL) 135 - 145 mmol/L   Potassium 3.8 3.5 - 5.1 mmol/L   Chloride 85 (L) 98 - 111 mmol/L   CO2 22 22 - 32 mmol/L   Glucose, Bld 310 (H) 70 - 99 mg/dL   BUN 15 6 - 20 mg/dL   Creatinine, Ser 0.16 0.61 - 1.24 mg/dL   Calcium 8.0 (L) 8.9 - 10.3 mg/dL   Total Protein 6.6 6.5 - 8.1 g/dL   Albumin 2.8 (L) 3.5 - 5.0 g/dL   AST 94 (H) 15 - 41 U/L   ALT 121 (H) 0 - 44 U/L   Alkaline Phosphatase 232 (H) 38 - 126 U/L   Total Bilirubin 2.9 (H) 0.3 - 1.2 mg/dL   GFR, Estimated >01 >09 mL/min   Anion gap 10 5 - 15   No results found for this or any previous visit (from the past 240 hour(s)). Creatinine: Recent Labs    12/25/22 0944 12/28/22 2118 12/28/22 2206  CREATININE 0.90 1.01 0.96   CT scan personally reviewed.  On my review looks like there may be more involved in the perineum rather than scrotum.  Impression/Assessment:  Fournier's gangrene versus scrotal/perineal abscess  Plan:  Plan for  incision and drainage with possible debridement of scrotum/perineum.  Risk benefits discussed including but not limited to bleeding, infection, injury to surrounding  structures, need for additional procedures, need for an open wound, possible extensive debridement.  He expressed understanding.  The patient has signs of possible Fournier's gangrene on CT.  This is a surgical emergency.  He needs surgery ASAP to prevent further advancement and spread.  He does have hyponatremia but delaying surgery poses high risk of needing further debridement than he may currently need and this is also life-threatening.  Ray Church, III 12/29/2022, 12:00 AM

## 2022-12-29 NOTE — Progress Notes (Signed)
Pharmacy Antibiotic Note  SLAYTER DATA is a 42 y.o. male admitted on 12/28/2022 with Fournier's gangrene of scrotum (post-op).  Pharmacy has been consulted for Vancomycin dosing for 7 days.  Plan: Pt given Vancomycin 1000 mg once. Vancomycin 1250 mg IV Q 8 hrs. Goal AUC 400-550. Expected AUC: 489.8 SCr used: 0.96  Pharmacy will continue to follow and will adjust abx dosing whenever warranted.  Temp (24hrs), Avg:98.4 F (36.9 C), Min:97.8 F (36.6 C), Max:98.8 F (37.1 C)   Recent Labs  Lab 12/25/22 0944 12/28/22 2118 12/28/22 2206  WBC 9.5 4.2  --   CREATININE 0.90 1.01 0.96  LATICACIDVEN  --  1.8  --     Estimated Creatinine Clearance: 129.9 mL/min (by C-G formula based on SCr of 0.96 mg/dL).    No Known Allergies  Antimicrobials this admission: 7/27 Zosyn >>  7/27 Vancomycin >>   Microbiology results: No lab cx currently ordered or pending at this time.  Thank you for allowing pharmacy to be a part of this patient's care.  Otelia Sergeant, PharmD, [facility] 12/29/2022 3:05 AM

## 2022-12-29 NOTE — Assessment & Plan Note (Addendum)
-   The patient will be placed on supplement coverage with resistant subcutaneous NovoLog. - We will continue his [place].

## 2022-12-29 NOTE — ED Provider Notes (Signed)
[facility] Provider Note    Event Date/Time   First MD Initiated Contact with Patient 12/25/22 [id number]     (approximate)   History   Abscess   HPI  Steve [name] is a 42 y.o. male  with history of Type 2 diabetes, hypertension and as listed in EMR presents to the emergency department for treatment and evaluation of pain to groin and perineal area x 2 days. No known fever. He tried to squeeze an area that felt like a boil, but did not get anything out of it.      Physical Exam   Triage Vital Signs: ED Triage Vitals  Encounter Vitals Group     BP 12/25/22 0842 (!) 162/98     Systolic BP Percentile --      Diastolic BP Percentile --      Pulse Rate 12/25/22 0842 88     Resp 12/25/22 0842 16     Temp 12/25/22 0842 97.7 F (36.5 C)     Temp Source 12/25/22 0842 Oral     SpO2 12/25/22 0842 100 %     Weight 12/25/22 0843 237 lb (107.5 kg)     Height 12/25/22 0843 6\' 1"  (1.854 m)     Head Circumference --      Peak Flow --      Pain Score 12/25/22 0842 10     Pain Loc --      Pain Education --      Exclude from Growth Chart --     Most recent vital signs: Vitals:   12/25/22 0842 12/25/22 1341  BP: (!) 162/98 (!) 155/88  Pulse: 88 81  Resp: 16 16  Temp: 97.7 F (36.5 C) 97.8 F (36.6 C)  SpO2: 100% 99%    General: Awake, no distress.  CV:  Good peripheral perfusion.  Resp:  Normal effort.  Abd:  No distention.  Other:  Erythema to right testicle and perineal area without focal area of fluctuance.   ED Results / Procedures / Treatments   Labs (all labs ordered are listed, but only abnormal results are displayed) Labs Reviewed  COMPREHENSIVE METABOLIC PANEL - Abnormal; Notable for the following components:      Result Value   Sodium 131 (*)    Chloride 97 (*)    Glucose, Bld 289 (*)    AST 14 (*)    Total Bilirubin 2.0 (*)    All other components within normal limits  CBC WITH DIFFERENTIAL/PLATELET - Abnormal; Notable for  the following components:   Lymphs Abs 0.6 (*)    Monocytes Absolute 1.1 (*)    All other components within normal limits     EKG     RADIOLOGY  Image and radiology report reviewed and interpreted by me. Radiology report consistent with the same.  CT pelvis with contrast shows no focal area of fluid collection or gas.  PROCEDURES:  Critical Care performed: No  Procedures   MEDICATIONS ORDERED IN ED:  Medications  iohexol (OMNIPAQUE) 300 MG/ML solution 100 mL (100 mLs Intravenous Contrast Given 12/25/22 1041)  cefTRIAXone (ROCEPHIN) 1 g in sodium chloride 0.9 % 100 mL IVPB (0 g Intravenous Stopped 12/25/22 1341)     IMPRESSION / MDM / ASSESSMENT AND PLAN / ED COURSE   I have reviewed the triage note.  Differential diagnosis includes, but is not limited to, cellulitis, Fournier's gangrene  Patient's presentation is most consistent with acute presentation with potential threat to life or  bodily function.  42 year old male presents to the emergency department for concern of boil in scrotal area. See HPI.  Vital signs without evidence of sepsis. Plan will be to get a CT pelvis with contrast to rule out Fournier's and give him 1g of Rocephin.  No focal area of fluid for I&D according to CT. Plan will be to have him start Bactrim and close follow up with PCP. Strict return precautions discussed as well.       FINAL CLINICAL IMPRESSION(S) / ED DIAGNOSES   Final diagnoses:  Cellulitis, scrotum     Rx / DC Orders   ED Discharge Orders          Ordered    sulfamethoxazole-trimethoprim (BACTRIM DS) 800-160 MG tablet  2 times daily        12/25/22 1246    HYDROcodone-acetaminophen (NORCO/VICODIN) 5-325 MG tablet  Every 6 hours PRN,   Status:  Discontinued        12/25/22 1246             Note:  This document was prepared using Dragon voice recognition software and may include unintentional dictation errors.   Chinita Pester, FNP 12/29/22 0957     Merwyn Katos, MD 01/03/23 [phone]

## 2022-12-29 NOTE — Assessment & Plan Note (Addendum)
-   The patient's status post debridement of his Fournier's gangrene. - She is seen postoperatively and is being admitted to a stepdown unit bed. - Will continue antibiotic therapy with IV Zosyn and vancomycin. - We will follow blood cultures. - Pain management will be provided. - Urology follow-up consultation will be obtained.

## 2022-12-29 NOTE — Progress Notes (Signed)
PHARMACIST - PHYSICIAN COMMUNICATION  CONCERNING:  Enoxaparin (Lovenox) for DVT Prophylaxis    RECOMMENDATION: Patient was prescribed enoxaprin 40mg  q24 hours for VTE prophylaxis.   Filed Weights   12/28/22 2039  Weight: 107 kg (235 lb 14.3 oz)    Body mass index is 31.12 kg/m.  Estimated Creatinine Clearance: 129.9 mL/min (by C-G formula based on SCr of 0.96 mg/dL).   Based on [facility] policy patient is candidate for enoxaparin 0.5mg /kg TBW SQ every 24 hours based on BMI being >30.  DESCRIPTION: Pharmacy has adjusted enoxaparin dose per [facility] policy.  Patient is now receiving enoxaparin 0.5 mg/kg every 24 hours   Otelia Sergeant, PharmD, [facility] 12/29/2022 3:10 AM

## 2022-12-29 NOTE — Assessment & Plan Note (Signed)
-   We will continue statin therapy. 

## 2022-12-29 NOTE — Progress Notes (Signed)
1600 Report received. Patient sleeping. 1700 Patient introduced to nurse. Ask about surgery tomorrow. Patient states he doesn't remember the doctor explaining anything or even visiting. Patient claims he was asleep. Permit for surgery not signed.

## 2022-12-29 NOTE — H&P (Addendum)
[place]   PATIENT NAME: Steve [name]    MR#:  [id number]  DATE OF BIRTH:  [birth year]  DATE OF ADMISSION:  12/28/2022  PRIMARY CARE PHYSICIAN: Care, [facility]   Patient is coming from: Home  REQUESTING/REFERRING PHYSICIAN: Crista Elliot, MD  CHIEF COMPLAINT:   Chief Complaint  Patient presents with   Abscess    genitals    HISTORY OF PRESENT ILLNESS:  Steve [name] is a 42 y.o. Caucasian male with medical history significant for type 2 diabetes mellitus, GERD, essential hypertension and gastritis, presented to the emergency room with acute onset of worsening abscess on his genitals for which he has been seen in the ER couple of days ago and placed on Septra for so far without improvement.  It has become more uncomfortable and more swollen.  He stated that he pushed the air pin into it to alleviate the swelling and increased purulent drainage so that he will feel better.  He denies any fever or chills.  No nausea or vomiting or abdominal pain.  No chest pain or palpitations.  No cough or wheezing or dyspnea.  No dysuria, oliguria or hematuria, urgency or frequency or flank pain.  Blood glucose levels have been elevated.  He had a pelvic CT scan revealing suspicious foreign years gangrene.  Use hyperglycemic as well is hyponatremic.  He was taken to the OR by Dr. Alvester Morin and underwent debridement.  ED Course: When he came to the ER, was's were within normal and later BP was 146/79, heart rate 93 and respiratory rate 22.  Labs revealed a sodium of 118 around was 117 and a chloride of 84 that later on was 85 with blood glucose of 318 and later 310, calcium of 8.1 and later 8, alk phos of 233 and later 232 with albumin of 2.8 and later the same, AST 97 and later 94, ALT 124 and later 121 and total bili of 3 and later 2.9.  Lactic acid was 1.8 and CBC showed microcytosis and was otherwise unremarkable. EKG as reviewed by me : None yet. Imaging: Pelvic CT with  contrast revealed the following: Constellation of findings are concerning for necrotizing infection the scrotum. No organized abscess.  The patient was given 4 mg of IV morphine sulfate and 4 mg of IV Zofran as well as IV Zosyn and vancomycin.  He underwent debridement of his foreign years gangrene as mentioned above by Dr. Alvester Morin.  We were asked to consult and admit the patient postoperatively.  He will be admitted to a stepdown unit bed from the PACU for further evaluation and management.  PAST MEDICAL HISTORY:   Past Medical History:  Diagnosis Date   Diabetes mellitus without complication (HCC)    GERD (gastroesophageal reflux disease)    History of gastritis 09/2016   Hypertension     PAST SURGICAL HISTORY:   Past Surgical History:  Procedure Laterality Date   COLONOSCOPY WITH PROPOFOL N/A 11/15/2019   Procedure: COLONOSCOPY WITH PROPOFOL WITH POLYPECTOMY, BIOPSIES;  Surgeon: Midge Minium, MD;  Location: [facility];  Service: Endoscopy;  Laterality: N/A;   ESOPHAGOGASTRODUODENOSCOPY (EGD) WITH PROPOFOL N/A 11/15/2019   Procedure: ESOPHAGOGASTRODUODENOSCOPY (EGD) WITH PROPOFOL WITH POLYPECTOMY, BIOPSIES;  Surgeon: Midge Minium, MD;  Location: [facility];  Service: Endoscopy;  Laterality: N/A;   TOOTH EXTRACTION      SOCIAL HISTORY:   Social History   Tobacco Use   Smoking status: Every Day  Current packs/day: 0.25    Average packs/day: 0.3 packs/day for 23.0 years (5.8 ttl pk-yrs)    Types: Cigarettes   Smokeless tobacco: Never   Tobacco comments:    since age 34  Substance Use Topics   Alcohol use: No    FAMILY HISTORY:   Family History  Problem Relation Age of Onset   Diabetes Brother    Hypertension Brother    Diabetes Maternal Aunt    Hypertension Maternal Aunt    Cancer Maternal Aunt        pancreas   Diabetes Maternal Uncle    Hypertension Maternal Uncle    Cancer Maternal Uncle        bone   Diabetes Maternal Grandmother    Cancer  Maternal Grandmother    Diabetes Maternal Grandfather    Cancer Maternal Uncle        lung   COPD Neg Hx    Heart disease Neg Hx    Stroke Neg Hx     DRUG ALLERGIES:  No Known Allergies  REVIEW OF SYSTEMS:   ROS As per history of present illness. All pertinent systems were reviewed above. Constitutional, HEENT, cardiovascular, respiratory, GI, GU, musculoskeletal, neuro, psychiatric, endocrine, integumentary and hematologic systems were reviewed and are otherwise negative/unremarkable except for positive findings mentioned above in the HPI.   MEDICATIONS AT HOME:   Prior to Admission medications   Medication Sig Start Date End Date Taking? Authorizing Provider  dapagliflozin propanediol (FARXIGA) 10 MG TABS tablet Take 1 tablet (10 mg total) by mouth daily before breakfast. 11/18/22  Yes Pearley, Sherran Needs, NP  dexlansoprazole (DEXILANT) 60 MG capsule TAKE 1 CAPSULE BY MOUTH EVERY DAY 10/22/22  Yes Larae Grooms, NP  sulfamethoxazole-trimethoprim (BACTRIM DS) 800-160 MG tablet Take 1 tablet by mouth 2 (two) times daily. 12/25/22  Yes Triplett, Cari B, FNP  ondansetron (ZOFRAN ODT) 4 MG disintegrating tablet Take 1 tablet (4 mg total) by mouth every 8 (eight) hours as needed for nausea or vomiting. 11/18/22   Pearley, Sherran Needs, NP  rosuvastatin (CRESTOR) 5 MG tablet Take 1 tablet (5 mg total) by mouth daily. Patient not taking: Reported on 12/28/2022 11/18/22   Weber Cooks, NP  tirzepatide [facility]) 2.5 MG/0.5ML Pen Inject 2.5 mg into the skin once a week. 11/18/22   Pearley, Sherran Needs, NP      VITAL SIGNS:  Blood pressure 124/79, pulse 76, temperature 99.1 F (37.3 C), temperature source Oral, resp. rate 19, height 6\' 1"  (1.854 m), weight 107 kg, SpO2 97%.  PHYSICAL EXAMINATION:  Physical Exam  GENERAL:  42 y.o.-year-old male patient lying in the bed with no acute distress.  EYES: Pupils equal, round, reactive to light and accommodation. No  scleral icterus. Extraocular muscles intact.  HEENT: Head atraumatic, normocephalic. Oropharynx and nasopharynx clear.  NECK:  Supple, no jugular venous distention. No thyroid enlargement, no tenderness.  LUNGS: Normal breath sounds bilaterally, no wheezing, rales,rhonchi or crepitation. No use of accessory muscles of respiration.  CARDIOVASCULAR: Regular rate and rhythm, S1, S2 normal. No murmurs, rubs, or gallops.  ABDOMEN: Soft, nondistended, nontender. Bowel sounds present. No organomegaly or mass. GU: Scrotum and perineal area were just dressed postoperatively with scrotal lift. EXTREMITIES: No pedal edema, cyanosis, or clubbing.  NEUROLOGIC: Cranial nerves II through XII are intact. Muscle strength 5/5 in all extremities. Sensation intact. Gait not checked.  PSYCHIATRIC: The patient is alert and oriented x 3.  Normal affect and good eye contact. SKIN: No obvious rash, lesion,  or ulcer.   LABORATORY PANEL:   CBC Recent Labs  Lab 12/29/22 0605  WBC 4.5  HGB 12.1*  HCT 34.7*  PLT 110*   ------------------------------------------------------------------------------------------------------------------  Chemistries  Recent Labs  Lab 12/28/22 2206 12/29/22 0605 12/29/22 0855 12/29/22 1800  NA 117* 121*   < > 124*  K 3.8 3.6  --   --   CL 85* 88*  --   --   CO2 22 23  --   --   GLUCOSE 310* 261*  --   --   BUN 15 17  --   --   CREATININE 0.96 1.23  --   --   CALCIUM 8.0* 7.9*  --   --   AST 94*  --   --   --   ALT 121*  --   --   --   ALKPHOS 232*  --   --   --   BILITOT 2.9*  --   --   --    < > = values in this interval not displayed.   ------------------------------------------------------------------------------------------------------------------  Cardiac Enzymes No results for input(s): "TROPONINI" in the last 168 hours. ------------------------------------------------------------------------------------------------------------------  RADIOLOGY:  CT PELVIS W  CONTRAST  Result Date: 12/28/2022 CLINICAL DATA:  Abscess on genitals. EXAM: CT PELVIS WITH CONTRAST TECHNIQUE: Multidetector CT imaging of the pelvis was performed using the standard protocol following the bolus administration of intravenous contrast. RADIATION DOSE REDUCTION: This exam was performed according to the departmental dose-optimization program which includes automated exposure control, adjustment of the mA and/or kV according to patient size and/or use of iterative reconstruction technique. CONTRAST:  OMNIPAQUE IOHEXOL 300 MG/ML  SOLN COMPARISON:  CT pelvis 12/25/2022 FINDINGS: Urinary Tract:  No abnormality visualized. Bowel:  Unremarkable visualized pelvic bowel loops. Vascular/Lymphatic: Reactive inguinal lymph nodes. No significant vascular abnormality seen. Reproductive: Redemonstrated soft tissue stranding on the right greater than left perineum. This extends into the scrotum where there is skin thickening greater on the right. There is new soft tissue gas within the posterior scrotum. Right-greater-than-left small hydroceles. No organized abscess. Other:  No free intraperitoneal fluid or air. Musculoskeletal: No acute osseous abnormality. IMPRESSION: Constellation of findings are concerning for necrotizing infection the scrotum. No organized abscess. Critical Value/emergent results were called by telephone at the time of interpretation on 12/28/2022 at 10:48 pm to provider [facility] , who verbally acknowledged these results. Electronically Signed   By: Minerva Fester M.D.   On: 12/28/2022 22:57      IMPRESSION AND PLAN:  Assessment and Plan: * Fournier's gangrene of scrotum - The patient's status post debridement of his Fournier's gangrene. - She is seen postoperatively and is being admitted to a stepdown unit bed. - Will continue antibiotic therapy with IV Zosyn and vancomycin. - We will follow blood cultures. - Pain management will be provided. - Urology follow-up  consultation will be obtained.  Sepsis due to cellulitis [facility]) - This is manifested by mild tachycardia and tachypnea. - Management as above. - He will be hydrated with IV normal saline.   Hyponatremia - This has not been improving and has is being placed in a stepdown unit bed. - We will start hydration with IV normal saline for now and obtain hyponatremia workup. - We will follow serial BMPs. - With worsening levels he may need 3% saline infusion.  Type 2 diabetes mellitus with hyperglycemia, without long-term current use of insulin (HCC) - The patient will be placed on supplement coverage with resistant subcutaneous  NovoLog. - We will continue his [place].  Dyslipidemia - We will continue statin therapy.  Essential hypertension - This is apparently diet managed and his BP has been fairly controlled.  GERD without esophagitis - We will continue PPI therapy.   DVT prophylaxis: Lovenox.  Advanced Care Planning:  Code Status: full code.  Family Communication:  The plan of care was discussed in details with the patient (and family). I answered all questions. The patient agreed to proceed with the above mentioned plan. Further management will depend upon hospital course. Disposition Plan: Back to previous home environment Consults called: Urology. All the records are reviewed and case discussed with ED provider.  Status is: Inpatient  At the time of the admission, it appears that the appropriate admission status for this patient is inpatient.  This is judged to be reasonable and necessary in order to provide the required intensity of service to ensure the patient's safety given the presenting symptoms, physical exam findings and initial radiographic and laboratory data in the context of comorbid conditions.  The patient requires inpatient status due to high intensity of service, high risk of further deterioration and high frequency of surveillance required.  I certify that at the  time of admission, it is my clinical judgment that the patient will require inpatient hospital care extending more than 2 midnights.                            Dispo: The patient is from: Home              Anticipated d/c is to: Home              Patient currently is not medically stable to d/c.              Difficult to place patient: No  Hannah Beat M.D on 12/29/2022 at 7:46 PM  [facility]   From 7 PM-7 AM, contact night-coverage [email or web address]  CC: Primary care physician; Care, [employer]

## 2022-12-29 NOTE — Anesthesia Procedure Notes (Signed)
Procedure Name: Intubation Date/Time: 12/29/2022 12:42 AM  Performed by: Jaye Beagle, CRNAPre-anesthesia Checklist: Patient identified, Emergency Drugs available, Suction available and Patient being monitored Patient Re-evaluated:Patient Re-evaluated prior to induction Oxygen Delivery Method: Circle system utilized Preoxygenation: Pre-oxygenation with 100% oxygen Induction Type: IV induction and Rapid sequence Laryngoscope Size: McGraph and 4 Grade View: Grade I Tube type: Oral Tube size: 7.5 mm Number of attempts: 1 Airway Equipment and Method: Stylet and Oral airway Placement Confirmation: ETT inserted through vocal cords under direct vision, positive ETCO2 and breath sounds checked- equal and bilateral Secured at: 22 cm Tube secured with: Tape Dental Injury: Teeth and Oropharynx as per pre-operative assessment

## 2022-12-29 NOTE — Progress Notes (Signed)
Urology Inpatient Progress Report  Cellulitis of perineum [L03.315] Fournier's gangrene of scrotum [N49.3]  Procedure(s): INCISION AND DEBRIDEMENT  Day of Surgery   Intv/Subj: Febrile today but feeling much better.  Principal Problem:   Fournier's gangrene of scrotum Active Problems:   Type 2 diabetes mellitus with hyperglycemia, without long-term current use of insulin (HCC)   Hyponatremia   Dyslipidemia   GERD without esophagitis   Essential hypertension   Sepsis due to cellulitis (HCC)  Current Facility-Administered Medications  Medication Dose Route Frequency Provider Last Rate Last Admin   0.9 %  sodium chloride infusion   Intravenous Continuous [name], [name], MD 125 mL/hr at 12/29/22 1300 Infusion Verify at 12/29/22 1300   acetaminophen (TYLENOL) tablet 650 mg  650 mg Oral Q6H PRN [name], [name], MD   650 mg at 12/29/22 1214   Or   acetaminophen (TYLENOL) suppository 650 mg  650 mg Rectal Q6H PRN [name], [name], MD       Chlorhexidine Gluconate Cloth 2 % PADS 6 each  6 each Topical Daily [name], MD   6 each at 12/29/22 1203   enoxaparin (LOVENOX) injection 50 mg  50 mg Subcutaneous Q24H [name], [name], MD       fentaNYL (SUBLIMAZE) injection 25 mcg  25 mcg Intravenous Q4H PRN [name], [name], MD       insulin aspart (novoLOG) injection 0-20 Units  0-20 Units Subcutaneous TID AC & HS [name], [name], MD   3 Units at 12/29/22 1202   insulin aspart (novoLOG) injection 8 Units  8 Units Subcutaneous TID WC [name], MD   8 Units at 12/29/22 1203   insulin glargine-yfgn (SEMGLEE) injection 20 Units  20 Units Subcutaneous Daily [name], MD   20 Units at 12/29/22 1202   ketorolac (TORADOL) 15 MG/ML injection 15 mg  15 mg Intravenous Q6H PRN [name], [name], MD   15 mg at 12/29/22 1209   magnesium hydroxide (MILK OF MAGNESIA) suspension 30 mL  30 mL Oral Daily PRN [name], [name], MD       ondansetron [facility]) tablet 4 mg  4 mg Oral Q6H PRN [name], [name], MD       Or   ondansetron  [facility]) injection 4 mg  4 mg Intravenous Q6H PRN [name], [name], MD       Oral care mouth rinse  15 mL Mouth Rinse PRN [name], [name], MD       piperacillin-tazobactam (ZOSYN) IVPB 3.375 g  3.375 g Intravenous Q8H Otelia Sergeant, [name] 12.5 mL/hr at 12/29/22 1304 3.375 g at 12/29/22 1304   traZODone (DESYREL) tablet 25 mg  25 mg Oral QHS PRN [name], [name], MD       vancomycin (VANCOREADY) IVPB 1500 mg/300 mL  1,500 mg Intravenous Q12H Tressie Ellis, [name]         Objective: Vital: Vitals:   12/29/22 1100 12/29/22 1200 12/29/22 1205 12/29/22 1300  BP:   (!) 153/77 104/63  Pulse: 91 90 98 86  Resp: (!) 21 (!) 21 (!) 24 (!) 21  Temp:   (!) 102.6 F (39.2 C) 99.2 F (37.3 C)  TempSrc:   Oral Oral  SpO2: 95% 96% 93% 96%  Weight:      Height:       I/Os: I/O last 3 completed shifts: In: 1139.2 [I.V.:889.2; IV Piggyback:250] Out: 800 [Urine:800]  Physical Exam:  General: Patient is in no apparent distress Lungs: Normal respiratory effort,  chest expands symmetrically. GI: The abdomen is soft and nontender without mass. GU: scrotum without erythema. Wound inspected and no obvious necrotic areas. Wound appears clean and viable Foley: dark yellow urine Ext: lower extremities symmetric  Lab Results: Recent Labs    12/28/22 2118 12/29/22 0605  WBC 4.2 4.5  HGB 13.3 12.1*  HCT 38.2* 34.7*   Recent Labs    12/28/22 2118 12/28/22 2206 12/29/22 0605 12/29/22 0855  NA 118* 117* 121* 122*  K 3.7 3.8 3.6  --   CL 84* 85* 88*  --   CO2 23 22 23   --   GLUCOSE 318* 310* 261*  --   BUN 14 15 17   --   CREATININE 1.01 0.96 1.23  --   CALCIUM 8.1* 8.0* 7.9*  --    Recent Labs    12/29/22 0605  INR 1.0   No results for input(s): "LABURIN" in the last 72 hours. Results for orders placed or performed during the hospital encounter of 12/28/22  MRSA Next Gen by PCR, Nasal     Status: None   Collection Time: 12/29/22  5:55 AM   Specimen: Nasal Mucosa; Nasal Swab  Result Value Ref  Range Status   MRSA by PCR Next Gen NOT DETECTED NOT DETECTED Final    Comment: (NOTE) The GeneXpert MRSA Assay (FDA approved for NASAL specimens only), is one component of a comprehensive MRSA colonization surveillance program. It is not intended to diagnose MRSA infection nor to guide or monitor treatment for MRSA infections. Test performance is not FDA approved in patients less than 41 years old. Performed at [facility], [address] [place], [place] [address]     Studies/Results: CT PELVIS W CONTRAST  Result Date: 12/28/2022 CLINICAL DATA:  Abscess on genitals. EXAM: CT PELVIS WITH CONTRAST TECHNIQUE: Multidetector CT imaging of the pelvis was performed using the standard protocol following the bolus administration of intravenous contrast. RADIATION DOSE REDUCTION: This exam was performed according to the departmental dose-optimization program which includes automated exposure control, adjustment of the mA and/or kV according to patient size and/or use of iterative reconstruction technique. CONTRAST:  OMNIPAQUE IOHEXOL 300 MG/ML  SOLN COMPARISON:  CT pelvis 12/25/2022 FINDINGS: Urinary Tract:  No abnormality visualized. Bowel:  Unremarkable visualized pelvic bowel loops. Vascular/Lymphatic: Reactive inguinal lymph nodes. No significant vascular abnormality seen. Reproductive: Redemonstrated soft tissue stranding on the right greater than left perineum. This extends into the scrotum where there is skin thickening greater on the right. There is new soft tissue gas within the posterior scrotum. Right-greater-than-left small hydroceles. No organized abscess. Other:  No free intraperitoneal fluid or air. Musculoskeletal: No acute osseous abnormality. IMPRESSION: Constellation of findings are concerning for necrotizing infection the scrotum. No organized abscess. Critical Value/emergent results were called by telephone at the time of interpretation on 12/28/2022 at 10:48 pm to  provider [facility] , who verbally acknowledged these results. Electronically Signed   By: Minerva Fester M.D.   On: 12/28/2022 22:57    Assessment: Fornier gangrene Procedure(s): INCISION AND DEBRIDEMENT, Day of Surgery  doing well.  Plan: Continue antibiotics and will make npo at [facility] for possible second look pending exam tomorrow.   Modena Slater, MD Urology 12/29/2022, 1:13 PM

## 2022-12-29 NOTE — Transfer of Care (Signed)
Immediate Anesthesia Transfer of Care Note  Patient: Steve [name]  Procedure(s) Performed: INCISION AND DEBRIDEMENT  Patient Location: PACU  Anesthesia Type:General  Level of Consciousness: drowsy  Airway & Oxygen Therapy: Patient Spontanous Breathing and Patient connected to face mask oxygen  Post-op Assessment: Report given to RN  Post vital signs: stable  Last Vitals:  Vitals Value Taken Time  BP 104/55 12/29/22 0207  Temp    Pulse 84 12/29/22 0208  Resp 22 12/29/22 0208  SpO2 98 % 12/29/22 0208  Vitals shown include unfiled device data.  Last Pain:  Vitals:   12/28/22 2039  TempSrc:   PainSc: 10-Worst pain ever         Complications: No notable events documented.

## 2022-12-29 NOTE — Assessment & Plan Note (Addendum)
-   This has not been improving and has is being placed in a stepdown unit bed. - We will start hydration with IV normal saline for now and obtain hyponatremia workup. - We will follow serial BMPs. - With worsening levels he may need 3% saline infusion.

## 2022-12-29 NOTE — Plan of Care (Signed)
Continuing with plan of care. 

## 2022-12-29 NOTE — Assessment & Plan Note (Signed)
-   This is apparently diet managed and his BP has been fairly controlled.

## 2022-12-29 NOTE — Anesthesia Postprocedure Evaluation (Signed)
Anesthesia Post Note  Patient: Steve [name]  Procedure(s) Performed: INCISION AND DEBRIDEMENT  Patient location during evaluation: SICU Anesthesia Type: General Level of consciousness: awake and alert Pain management: pain level controlled Vital Signs Assessment: post-procedure vital signs reviewed and stable Respiratory status: spontaneous breathing Cardiovascular status: stable Postop Assessment: no apparent nausea or vomiting Anesthetic complications: no  No notable events documented.   Last Vitals:  Vitals:   12/29/22 0700 12/29/22 0800  BP: 115/72 117/70  Pulse: 87 86  Resp: 20 19  Temp:  (!) 38 C  SpO2: 93% 95%    Last Pain:  Vitals:   12/29/22 0800  TempSrc: Oral  PainSc: 0-No pain                 Stephanie Coup

## 2022-12-29 NOTE — Anesthesia Preprocedure Evaluation (Signed)
Anesthesia Evaluation  Patient identified by MRN, date of birth, ID band Patient awake    Reviewed: Allergy & Precautions, H&P , NPO status , Patient's Chart, lab work & pertinent test results, reviewed documented beta blocker date and time   History of Anesthesia Complications Negative for: history of anesthetic complications  Airway Mallampati: III  TM Distance: >3 FB Neck ROM: full    Dental  (+) Dental Advidsory Given, Caps, Missing, Poor Dentition   Pulmonary neg shortness of breath, Continuous Positive Airway Pressure Ventilation , neg COPD, neg recent URI, Current Smoker and Patient abstained from smoking.   Pulmonary exam normal breath sounds clear to auscultation       Cardiovascular Exercise Tolerance: Good hypertension, (-) angina (-) Past MI and (-) Cardiac Stents Normal cardiovascular exam(-) dysrhythmias (-) Valvular Problems/Murmurs Rhythm:regular Rate:Normal     Neuro/Psych negative neurological ROS  negative psych ROS   GI/Hepatic Neg liver ROS,GERD  ,,  Endo/Other  diabetes, Poorly Controlled    Renal/GU negative Renal ROS  negative genitourinary   Musculoskeletal   Abdominal   Peds  Hematology negative hematology ROS (+)   Anesthesia Other Findings Past Medical History: No date: Diabetes mellitus without complication (HCC) No date: GERD (gastroesophageal reflux disease) 09/2016: History of gastritis No date: Hypertension   Reproductive/Obstetrics negative OB ROS                             Anesthesia Physical Anesthesia Plan  ASA: 3 and emergent  Anesthesia Plan: General   Post-op Pain Management:    Induction: Intravenous, Rapid sequence and Cricoid pressure planned  PONV Risk Score and Plan: 1 and Ondansetron, Dexamethasone, Midazolam and Treatment may vary due to age or medical condition  Airway Management Planned: Oral ETT  Additional Equipment:    Intra-op Plan:   Post-operative Plan: Extubation in OR  Informed Consent: I have reviewed the patients History and Physical, chart, labs and discussed the procedure including the risks, benefits and alternatives for the proposed anesthesia with the patient or authorized representative who has indicated his/her understanding and acceptance.     Dental Advisory Given  Plan Discussed with: Anesthesiologist, CRNA and Surgeon  Anesthesia Plan Comments:        Anesthesia Quick Evaluation

## 2022-12-29 NOTE — TOC Progression Note (Signed)
Transition of Care [facility]) - Progression Note    Patient Details  Name: Steve [name] MRN: [id number] Date of Birth: 04-23-1981  Transition of Care [facility]) CM/SW Contact  Colette Ribas, [place] Phone Number: 12/29/2022, 4:37 PM  Clinical Narrative:     CSW met with patient bedside to explain reccommendations. He is ok with HH, he does not have a preference based on his ins I contact [employer]--they declined due to no availability and I left a message for [facility].      Expected Discharge Plan and Services                                               Social Determinants of Health (SDOH) Interventions SDOH Screenings   Food Insecurity: No Food Insecurity (12/29/2022)  Housing: Low Risk  (12/29/2022)  Recent Concern: Housing - Medium Risk (12/03/2022)  Transportation Needs: No Transportation Needs (12/29/2022)  Utilities: At Risk (12/29/2022)  Alcohol Screen: Low Risk  (12/02/2022)  Depression (PHQ2-9): Low Risk  (12/02/2022)  Recent Concern: Depression (PHQ2-9) - Medium Risk (11/18/2022)  Financial Resource Strain: Medium Risk (12/02/2022)  Physical Activity: Sufficiently Active (12/02/2022)  Social Connections: Socially Isolated (12/02/2022)  Stress: No Stress Concern Present (12/02/2022)  Tobacco Use: High Risk (12/29/2022)    Readmission Risk Interventions     No data to display

## 2022-12-29 NOTE — Progress Notes (Signed)
Pharmacy Antibiotic Note  Steve [name] is a 42 y.o. male with PMH including DM, GERD, HTN, gastritis admitted on 12/28/2022 with Fournier's gangrene of scrotum. Now status post excisional debridement of perineum on 7/28. Pharmacy has been consulted for vancomycin dosing. Patient is also ordered Zosyn.  Plan:  Adjust vancomycin to 1.5 g IV q12h --Calculated AUC: 495.8, Cmin: 14 --Scr 1.23, IBW, Vd 0.72 (BMI 31.1) --Daily Scr per protocol --Levels at steady state or as clinically indicated  Pharmacy will continue to follow and will adjust abx dosing whenever warranted.  Temp (24hrs), Avg:98.7 F (37.1 C), Min:97.8 F (36.6 C), Max:100.4 F (38 C)   Recent Labs  Lab 12/25/22 0944 12/28/22 2118 12/28/22 2206 12/29/22 0605  WBC 9.5 4.2  --  4.5  CREATININE 0.90 1.01 0.96 1.23  LATICACIDVEN  --  1.8  --   --     Estimated Creatinine Clearance: 101.4 mL/min (by C-G formula based on SCr of 1.23 mg/dL).    No Known Allergies  Antimicrobials this admission: Zosyn 7/27 >>  Vancomycin 7/27 >>   Microbiology results: No lab cx currently ordered or pending at this time.  Thank you for allowing pharmacy to be a part of this patient's care.  Tressie Ellis 12/29/2022 10:16 AM

## 2022-12-29 NOTE — Progress Notes (Signed)
Temperature is 102.5, tylenol prn administered, Dr. Allena Katz notified, will continue to monitor.

## 2022-12-30 ENCOUNTER — Encounter: Payer: Self-pay | Admitting: Urology

## 2022-12-30 DIAGNOSIS — L0390 Cellulitis, unspecified: Secondary | ICD-10-CM | POA: Diagnosis not present

## 2022-12-30 DIAGNOSIS — Z794 Long term (current) use of insulin: Secondary | ICD-10-CM

## 2022-12-30 DIAGNOSIS — E1165 Type 2 diabetes mellitus with hyperglycemia: Secondary | ICD-10-CM | POA: Diagnosis not present

## 2022-12-30 DIAGNOSIS — N493 Fournier gangrene: Secondary | ICD-10-CM | POA: Diagnosis not present

## 2022-12-30 DIAGNOSIS — E871 Hypo-osmolality and hyponatremia: Secondary | ICD-10-CM | POA: Diagnosis not present

## 2022-12-30 LAB — AEROBIC CULTURE W GRAM STAIN (SUPERFICIAL SPECIMEN)
Gram Stain: NONE SEEN
Gram Stain: NONE SEEN

## 2022-12-30 LAB — GLUCOSE, CAPILLARY
Glucose-Capillary: 212 mg/dL — ABNORMAL HIGH (ref 70–99)
Glucose-Capillary: 213 mg/dL — ABNORMAL HIGH (ref 70–99)
Glucose-Capillary: 250 mg/dL — ABNORMAL HIGH (ref 70–99)
Glucose-Capillary: 261 mg/dL — ABNORMAL HIGH (ref 70–99)
Glucose-Capillary: 264 mg/dL — ABNORMAL HIGH (ref 70–99)
Glucose-Capillary: 284 mg/dL — ABNORMAL HIGH (ref 70–99)

## 2022-12-30 MED ORDER — INSULIN STARTER KIT- PEN NEEDLES (ENGLISH)
1.0000 | Freq: Once | Status: AC
  Administered 2022-12-30: 1
  Filled 2022-12-30 (×2): qty 1

## 2022-12-30 MED ORDER — SODIUM CHLORIDE 0.9 % IV BOLUS
250.0000 mL | Freq: Once | INTRAVENOUS | Status: AC
  Administered 2022-12-30: 250 mL via INTRAVENOUS

## 2022-12-30 MED ORDER — INSULIN ASPART 100 UNIT/ML IJ SOLN
0.0000 [IU] | INTRAMUSCULAR | Status: DC
  Administered 2022-12-30: 3 [IU] via SUBCUTANEOUS
  Administered 2022-12-30: 5 [IU] via SUBCUTANEOUS
  Administered 2022-12-30: 3 [IU] via SUBCUTANEOUS
  Administered 2022-12-31 (×2): 5 [IU] via SUBCUTANEOUS
  Administered 2022-12-31 – 2023-01-01 (×5): 3 [IU] via SUBCUTANEOUS
  Administered 2023-01-01: 2 [IU] via SUBCUTANEOUS
  Administered 2023-01-01 (×2): 3 [IU] via SUBCUTANEOUS
  Administered 2023-01-01 (×2): 5 [IU] via SUBCUTANEOUS
  Administered 2023-01-01: 3 [IU] via SUBCUTANEOUS
  Administered 2023-01-02: 1 [IU] via SUBCUTANEOUS
  Administered 2023-01-02: 3 [IU] via SUBCUTANEOUS
  Administered 2023-01-02 – 2023-01-03 (×5): 2 [IU] via SUBCUTANEOUS
  Administered 2023-01-03: 1 [IU] via SUBCUTANEOUS
  Administered 2023-01-03: 2 [IU] via SUBCUTANEOUS
  Administered 2023-01-03 (×2): 1 [IU] via SUBCUTANEOUS
  Administered 2023-01-03: 2 [IU] via SUBCUTANEOUS
  Administered 2023-01-04: 1 [IU] via SUBCUTANEOUS
  Administered 2023-01-04 (×2): 2 [IU] via SUBCUTANEOUS
  Administered 2023-01-04: 3 [IU] via SUBCUTANEOUS
  Administered 2023-01-04: 2 [IU] via SUBCUTANEOUS
  Administered 2023-01-05 (×5): 1 [IU] via SUBCUTANEOUS
  Administered 2023-01-05: 2 [IU] via SUBCUTANEOUS
  Administered 2023-01-06: 1 [IU] via SUBCUTANEOUS
  Administered 2023-01-06: 2 [IU] via SUBCUTANEOUS
  Administered 2023-01-06: 1 [IU] via SUBCUTANEOUS
  Filled 2022-12-30 (×42): qty 1

## 2022-12-30 MED ORDER — LIVING WELL WITH DIABETES BOOK
Freq: Once | Status: AC
  Administered 2022-12-30: 1
  Filled 2022-12-30: qty 1

## 2022-12-30 NOTE — Consult Note (Signed)
NAME: Steve [name]  DOB: 1981/04/13  MRN: [id number]  Date/Time: 12/30/2022 10:53 AM  REQUESTING PROVIDER: Dr.Patel Subjective:  REASON FOR CONSULT: Fourniers gangrene ? Steve [name] is a 42 y.o. male with a history of DM presents to the hospital with worsening painful swelling of the scrotum Patient came to the ED a few days ago on 12/25/2022 with a boil like lesion on the scrotum and was given Bactrim.  He was taking it but it did not get better and so he came back to the ED on 12/28/2022 with worsening swelling and pain in the scrotum. Vitals in the ED BP of 136/102, temperature 98.8, pulse 93, respiratory 16 and sats of 96%. WBC was 4.2, Hb 13.3, platelet 116 and creatinine of 0.96 No blood culture was sent Patient went to the OR on 12/21/2022 by Dr. Alvester Morin and had underwent debridement of the infected tissue.  No culture was sent .  Patient has been started on broad-spectrum antibiotic linezolid and Zosyn and I am asked to see the patient for the same. 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 He says his blood sugar has not been under good control His last A1c in the hospital was 12 He lives on his own    Past Medical History:  Diagnosis Date   Diabetes mellitus without complication (HCC)    GERD (gastroesophageal reflux disease)    History of gastritis 09/2016   Hypertension     Past Surgical History:  Procedure Laterality Date   COLONOSCOPY WITH PROPOFOL N/A 11/15/2019   Procedure: COLONOSCOPY WITH PROPOFOL WITH POLYPECTOMY, BIOPSIES;  Surgeon: Midge Minium, MD;  Location: [facility];  Service: Endoscopy;  Laterality: N/A;   ESOPHAGOGASTRODUODENOSCOPY (EGD) WITH PROPOFOL N/A 11/15/2019   Procedure: ESOPHAGOGASTRODUODENOSCOPY (EGD) WITH PROPOFOL WITH POLYPECTOMY, BIOPSIES;  Surgeon: Midge Minium, MD;  Location: [facility];  Service:  Endoscopy;  Laterality: N/A;   INCISION AND DRAINAGE ABSCESS N/A 12/29/2022   Procedure: INCISION AND DEBRIDEMENT;  Surgeon: Crista Elliot, MD;  Location: [facility];  Service: Urology;  Laterality: N/A;   TOOTH EXTRACTION      Social History   Socioeconomic History   Marital status: Single    Spouse name: Not on file   Number of children: Not on file   Years of education: Not on file   Highest education level: Not on file  Occupational History   Not on file  Tobacco Use   Smoking status: Every Day    Current packs/day: 0.25    Average packs/day: 0.3 packs/day for 23.0 years (5.8 ttl pk-yrs)    Types: Cigarettes   Smokeless tobacco: Never   Tobacco comments:    since age 71  Vaping Use   Vaping status: Every Day  Substance and Sexual Activity   Alcohol use: No   Drug use: Yes    Types: Marijuana   Sexual activity: Yes  Other Topics Concern   Not on file  Social History Narrative   Not on file   Social Determinants of Health   Financial Resource Strain: Medium Risk (12/02/2022)   Overall Financial Resource Strain (CARDIA)    Difficulty of Paying Living Expenses: Somewhat hard  Food Insecurity: No Food Insecurity (12/29/2022)   Hunger Vital Sign    Worried About Running Out of Food in the Last Year: Never true    Ran  Out of Food in the Last Year: Never true  Transportation Needs: No Transportation Needs (12/29/2022)   PRAPARE - Administrator, Civil Service (Medical): No    Lack of Transportation (Non-Medical): No  Physical Activity: Sufficiently Active (12/02/2022)   Exercise Vital Sign    Days of Exercise per Week: 3 days    Minutes of Exercise per Session: 60 min  Stress: No Stress Concern Present (12/02/2022)   [employer] of Occupational Health - Occupational Stress Questionnaire    Feeling of Stress : Not at all  Social Connections: Socially Isolated (12/02/2022)   Social Connection and Isolation Panel [NHANES]    Frequency of Communication with  Friends and Family: Once a week    Frequency of Social Gatherings with Friends and Family: Never    Attends Religious Services: Never    Database administrator or Organizations: Yes    Attends Banker Meetings: Never    Marital Status: Never married  Intimate Partner Violence: Not At Risk (12/29/2022)   Humiliation, Afraid, Rape, and Kick questionnaire    Fear of Current or Ex-Partner: No    Emotionally Abused: No    Physically Abused: No    Sexually Abused: No    Family History  Problem Relation Age of Onset   Diabetes Brother    Hypertension Brother    Diabetes Maternal Aunt    Hypertension Maternal Aunt    Cancer Maternal Aunt        pancreas   Diabetes Maternal Uncle    Hypertension Maternal Uncle    Cancer Maternal Uncle        bone   Diabetes Maternal Grandmother    Cancer Maternal Grandmother    Diabetes Maternal Grandfather    Cancer Maternal Uncle        lung   COPD Neg Hx    Heart disease Neg Hx    Stroke Neg Hx    No Known Allergies I? Current Facility-Administered Medications  Medication Dose Route Frequency Provider Last Rate Last Admin   0.9 %  sodium chloride infusion   Intravenous Continuous [name], MD 75 mL/hr at 12/30/22 1040 75 mL/hr at 12/30/22 1040   acetaminophen (TYLENOL) tablet 650 mg  650 mg Oral Q6H PRN [name], [name], MD   650 mg at 12/29/22 2045   Or   acetaminophen (TYLENOL) suppository 650 mg  650 mg Rectal Q6H PRN [name], [name], MD       Chlorhexidine Gluconate Cloth 2 % PADS 6 each  6 each Topical Daily [name], MD   6 each at 12/30/22 1042   enoxaparin (LOVENOX) injection 50 mg  50 mg Subcutaneous Q24H [name], [name], MD   50 mg at 12/29/22 2157   fentaNYL (SUBLIMAZE) injection 25 mcg  25 mcg Intravenous Q4H PRN [name], [name], MD       insulin aspart (novoLOG) injection 0-20 Units  0-20 Units Subcutaneous TID [facility] [name], [name], MD   7 Units at 12/30/22 0807   insulin aspart (novoLOG) injection 8 Units  8 Units  Subcutaneous TID WC [name], MD   8 Units at 12/29/22 1203   insulin glargine-yfgn (SEMGLEE) injection 20 Units  20 Units Subcutaneous Daily [name], MD   20 Units at 12/30/22 1042   ketorolac (TORADOL) 15 MG/ML injection 15 mg  15 mg Intravenous Q6H PRN [name], [name], MD   15 mg at 12/30/22 0821   linezolid (ZYVOX) IVPB  600 mg  600 mg Intravenous Q12H Jaynie Bream, [name] 300 mL/hr at 12/30/22 1042 600 mg at 12/30/22 1042   magnesium hydroxide (MILK OF MAGNESIA) suspension 30 mL  30 mL Oral Daily PRN [name], [name], MD       ondansetron [facility]) tablet 4 mg  4 mg Oral Q6H PRN [name], [name], MD       Or   ondansetron [facility]) injection 4 mg  4 mg Intravenous Q6H PRN [name], [name], MD       Oral care mouth rinse  15 mL Mouth Rinse PRN [name], [name], MD       piperacillin-tazobactam (ZOSYN) IVPB 3.375 g  3.375 g Intravenous Q8H Otelia Sergeant, [name] 12.5 mL/hr at 12/30/22 2595 3.375 g at 12/30/22 6387   traZODone (DESYREL) tablet 25 mg  25 mg Oral QHS PRN [name], [name], MD         Abtx:  Anti-infectives (From admission, onward)    Start     Dose/Rate Route Frequency Ordered Stop   12/29/22 1600  vancomycin (VANCOREADY) IVPB 1500 mg/300 mL  Status:  Discontinued        1,500 mg 150 mL/hr over 120 Minutes Intravenous Every 12 hours 12/29/22 1014 12/29/22 1329   12/29/22 1430  linezolid (ZYVOX) IVPB 600 mg        600 mg 300 mL/hr over 60 Minutes Intravenous Every 12 hours 12/29/22 1329     12/29/22 0600  piperacillin-tazobactam (ZOSYN) IVPB 3.375 g  Status:  Discontinued        3.375 g 100 mL/hr over 30 Minutes Intravenous Every 6 hours 12/29/22 0254 12/29/22 0257   12/29/22 0600  piperacillin-tazobactam (ZOSYN) IVPB 3.375 g        3.375 g 12.5 mL/hr over 240 Minutes Intravenous Every 8 hours 12/29/22 0257     12/29/22 0400  vancomycin (VANCOREADY) IVPB 1250 mg/250 mL  Status:  Discontinued        1,250 mg 166.7 mL/hr over 90 Minutes Intravenous Every 8 hours 12/29/22 0304 12/29/22 1014    12/29/22 0300  vancomycin (VANCOCIN) IVPB 1000 mg/200 mL premix  Status:  Discontinued        1,000 mg 200 mL/hr over 60 Minutes Intravenous  Once 12/29/22 0254 12/29/22 0304   12/28/22 2215  piperacillin-tazobactam (ZOSYN) IVPB 3.375 g        3.375 g 100 mL/hr over 30 Minutes Intravenous  Once 12/28/22 2204 12/28/22 2343   12/28/22 2215  vancomycin (VANCOCIN) IVPB 1000 mg/200 mL premix        1,000 mg 200 mL/hr over 60 Minutes Intravenous  Once 12/28/22 2204 12/29/22 0257       REVIEW OF SYSTEMS:  Const: negative fever, negative chills, negative weight loss Eyes: 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  Objective:  VITALS:  BP 136/78   Pulse 72   Temp 98.3 F (36.8 C)   Resp 19   Ht 6\' 1"  (1.854 m)   Wt 107 kg   SpO2 97%  BMI 31.12 kg/m  LDA Foley Central line Other drainage tubes PHYSICAL EXAM:  General: Alert, cooperative, no distress, albinism Head: Normocephalic, without obvious abnormality, atraumatic. Eyes: Conjunctivae clear, anicteric sclerae. Pupils are equal ENT Nares normal. No drainage or sinus tenderness. Lips, mucosa, and tongue normal. No Thrush Neck: Supple, symmetrical, no adenopathy, thyroid: non tender no carotid bruit and no JVD. Back: No CVA tenderness. Lungs: Clear to auscultation bilaterally. No Wheezing or Rhonchi. No rales. Heart: Regular rate and rhythm, no murmur,  rub or gallop. Abdomen: Soft, non-tender,not distended. Bowel sounds normal. No masses Extremities: atraumatic, no cyanosis. No edema. No clubbing Skin: No rashes or lesions. Or bruising Lymph: Cervical, supraclavicular normal. Neurologic: Grossly non-focal Exam nation of the scrotum.  Patient has dressing in his did not examine Pictures reviewed        Pertinent Labs Lab Results CBC    Component Value Date/Time   WBC 4.5 12/29/2022 0605   RBC 4.35 12/29/2022 0605   HGB 12.1 (L) 12/29/2022 0605   HGB 14.4 03/15/2020 0934   HCT 34.7 (L) 12/29/2022 0605   HCT 42.6 03/15/2020 0934   PLT 110 (L) 12/29/2022 0605   PLT 131 (L) 03/15/2020 0934   MCV 79.8 (L) 12/29/2022 0605   MCV 83 03/15/2020 0934   MCH 27.8 12/29/2022 0605   MCHC 34.9 12/29/2022 0605   RDW 11.9 12/29/2022 0605   RDW 11.9 03/15/2020 0934   LYMPHSABS 0.1 (L) 12/28/2022 2118   LYMPHSABS 1.2 03/15/2020 0934   MONOABS 0.4 12/28/2022 2118   EOSABS 0.0 12/28/2022 2118   EOSABS 0.1 03/15/2020 0934   BASOSABS 0.0 12/28/2022 2118   BASOSABS 0.0 03/15/2020 0934       Latest Ref Rng & Units 12/30/2022    8:54 AM 12/30/2022    4:21 AM 12/30/2022   12:54 AM  CMP  Sodium 135 - 145 mmol/L 126  127  121       Microbiology: Recent Results (from the past 240 hour(s))  MRSA Next Gen by PCR, Nasal     Status: None   Collection Time: 12/29/22  5:55 AM   Specimen: Nasal Mucosa; Nasal Swab  Result Value Ref Range Status   MRSA by PCR Next Gen NOT DETECTED NOT DETECTED Final    Comment: (NOTE) The GeneXpert MRSA Assay (FDA approved for NASAL specimens only), is one component of a comprehensive MRSA colonization surveillance program. It is not intended to diagnose MRSA infection nor to guide or monitor treatment for MRSA infections. Test performance is not FDA approved in patients less than 10 years old. Performed at [facility], [address] [place], [place] [address]     IMAGING RESULTS: CT  pelvis Soft tissue stranding on the right greater than the left perineum This extends into the scrotum where there is skin thickening greater on the right There is new soft tissue gas within the posterior scrotum  I have personally reviewed the films ? Impression/Recommendation Fournier's gangrene of the scrotum Extensive debridement has been done Patient is currently on Zosyn and linezolid No surgical cultures were sent Requested urology team to do bedside culture  Diabetes mellitus poorly controlled On insulin currently  Albinism ? ___________________________________________________ Discussed with patient, requesting provider and the care team Note:  This document was prepared using Dragon voice recognition software and may include unintentional dictation errors.

## 2022-12-30 NOTE — Progress Notes (Signed)
Called to the bedside, Patient complaining of feeling "wet",upon assessing his surgical site, dressing was completely soiled, draining serosanguineous drainage, No odor noted. Doreen Salvage charge nurse at bedside as well, placing wet to dry, Saline gauze fluff packed into incision, Abd pads placed on top, with tape.Will continue to monitor.

## 2022-12-30 NOTE — Progress Notes (Addendum)
Urology Inpatient Progress Note  Subjective: No acute events overnight.  He is afebrile, VSS.  Last fever yesterday at 1205.  He has been n.p.o. at midnight in advance of possible second look today. Sodium level improving, now 126.  No cultures available, on antibiotics as below. Foley catheter in place draining clear, orange urine. He states he lives alone, but has loved ones who would be able to perform dressing changes at home upon discharge.  Anti-infectives: Anti-infectives (From admission, onward)    Start     Dose/Rate Route Frequency Ordered Stop   12/29/22 1600  vancomycin (VANCOREADY) IVPB 1500 mg/300 mL  Status:  Discontinued        1,500 mg 150 mL/hr over 120 Minutes Intravenous Every 12 hours 12/29/22 1014 12/29/22 1329   12/29/22 1430  linezolid (ZYVOX) IVPB 600 mg        600 mg 300 mL/hr over 60 Minutes Intravenous Every 12 hours 12/29/22 1329     12/29/22 0600  piperacillin-tazobactam (ZOSYN) IVPB 3.375 g  Status:  Discontinued        3.375 g 100 mL/hr over 30 Minutes Intravenous Every 6 hours 12/29/22 0254 12/29/22 0257   12/29/22 0600  piperacillin-tazobactam (ZOSYN) IVPB 3.375 g        3.375 g 12.5 mL/hr over 240 Minutes Intravenous Every 8 hours 12/29/22 0257     12/29/22 0400  vancomycin (VANCOREADY) IVPB 1250 mg/250 mL  Status:  Discontinued        1,250 mg 166.7 mL/hr over 90 Minutes Intravenous Every 8 hours 12/29/22 0304 12/29/22 1014   12/29/22 0300  vancomycin (VANCOCIN) IVPB 1000 mg/200 mL premix  Status:  Discontinued        1,000 mg 200 mL/hr over 60 Minutes Intravenous  Once 12/29/22 0254 12/29/22 0304   12/28/22 2215  piperacillin-tazobactam (ZOSYN) IVPB 3.375 g        3.375 g 100 mL/hr over 30 Minutes Intravenous  Once 12/28/22 2204 12/28/22 2343   12/28/22 2215  vancomycin (VANCOCIN) IVPB 1000 mg/200 mL premix        1,000 mg 200 mL/hr over 60 Minutes Intravenous  Once 12/28/22 2204 12/29/22 0257       Current Facility-Administered Medications   Medication Dose Route Frequency Provider Last Rate Last Admin   0.9 %  sodium chloride infusion   Intravenous Continuous [name], MD 150 mL/hr at 12/30/22 0518 Rate Change at 12/30/22 0518   acetaminophen (TYLENOL) tablet 650 mg  650 mg Oral Q6H PRN [name], [name], MD   650 mg at 12/29/22 2045   Or   acetaminophen (TYLENOL) suppository 650 mg  650 mg Rectal Q6H PRN [name], [name], MD       Chlorhexidine Gluconate Cloth 2 % PADS 6 each  6 each Topical Daily [name], MD   6 each at 12/29/22 1203   enoxaparin (LOVENOX) injection 50 mg  50 mg Subcutaneous Q24H [name], [name], MD   50 mg at 12/29/22 2157   fentaNYL (SUBLIMAZE) injection 25 mcg  25 mcg Intravenous Q4H PRN [name], [name], MD       insulin aspart (novoLOG) injection 0-20 Units  0-20 Units Subcutaneous TID AC & HS [name], [name], MD   7 Units at 12/30/22 0807   insulin aspart (novoLOG) injection 8 Units  8 Units Subcutaneous TID WC [name], MD   8 Units at 12/29/22 1203   insulin glargine-yfgn (SEMGLEE) injection 20 Units  20 Units Subcutaneous Daily [name], MD  20 Units at 12/29/22 1202   ketorolac (TORADOL) 15 MG/ML injection 15 mg  15 mg Intravenous Q6H PRN [name], [name], MD   15 mg at 12/30/22 5956   linezolid (ZYVOX) IVPB 600 mg  600 mg Intravenous Q12H Jaynie Bream, [name] 300 mL/hr at 12/29/22 2156 600 mg at 12/29/22 2156   magnesium hydroxide (MILK OF MAGNESIA) suspension 30 mL  30 mL Oral Daily PRN [name], [name], MD       ondansetron [facility]) tablet 4 mg  4 mg Oral Q6H PRN [name], [name], MD       Or   ondansetron [facility]) injection 4 mg  4 mg Intravenous Q6H PRN [name], [name], MD       Oral care mouth rinse  15 mL Mouth Rinse PRN [name], [name], MD       piperacillin-tazobactam (ZOSYN) IVPB 3.375 g  3.375 g Intravenous Q8H Otelia Sergeant, [name] 12.5 mL/hr at 12/30/22 0637 3.375 g at 12/30/22 3875   traZODone (DESYREL) tablet 25 mg  25 mg Oral QHS PRN [name], [name], MD       Objective: Vital signs in last 24 hours: Temp:   [98.2 F (36.8 C)-102.6 F (39.2 C)] 98.3 F (36.8 C) (07/29 0800) Pulse Rate:  [72-98] 72 (07/29 0900) Resp:  [13-24] 19 (07/29 0600) BP: (104-153)/(52-90) 136/78 (07/29 0900) SpO2:  [93 %-99 %] 97 % (07/29 0900)  Intake/Output from previous day: 07/28 0701 - 07/29 0700 In: 2285.1 [I.V.:1953.8; IV Piggyback:331.3] Out: 2150 [Urine:2150] Intake/Output this shift: No intake/output data recorded.  Physical Exam Vitals and nursing note reviewed.  Constitutional:      General: He is not in acute distress.    Appearance: He is not ill-appearing, toxic-appearing or diaphoretic.  HENT:     Head: Normocephalic and atraumatic.  Pulmonary:     Effort: Pulmonary effort is normal. No respiratory distress.  Genitourinary:    Comments: Bilateral exposed but viable appearing testicles, posterior hemiscrotum surgically absent.  Perineal wound examined thoroughly.  There are some dusky borders at the inferior margin and right lateral margin, see photos in chart.  No malodor or crepitus noted today. Skin:    General: Skin is warm and dry.  Neurological:     Mental Status: He is alert and oriented to person, place, and time.  Psychiatric:        Mood and Affect: Mood normal.        Behavior: Behavior normal.    Lab Results:  Recent Labs    12/28/22 2118 12/29/22 0605  WBC 4.2 4.5  HGB 13.3 12.1*  HCT 38.2* 34.7*  PLT 116* 110*   BMET Recent Labs    12/28/22 2206 12/29/22 0605 12/29/22 0855 12/30/22 0421 12/30/22 0854  NA 117* 121*   < > 127* 126*  K 3.8 3.6  --   --   --   CL 85* 88*  --   --   --   CO2 22 23  --   --   --   GLUCOSE 310* 261*  --   --   --   BUN 15 17  --   --   --   CREATININE 0.96 1.23  --   --   --   CALCIUM 8.0* 7.9*  --   --   --    < > = values in this interval not displayed.   PT/INR Recent Labs    12/29/22 0605  LABPROT 13.5  INR 1.0   Studies/Results: CT PELVIS W CONTRAST  Result Date: 12/28/2022 CLINICAL DATA:  Abscess on genitals.  EXAM: CT PELVIS WITH CONTRAST TECHNIQUE: Multidetector CT imaging of the pelvis was performed using the standard protocol following the bolus administration of intravenous contrast. RADIATION DOSE REDUCTION: This exam was performed according to the departmental dose-optimization program which includes automated exposure control, adjustment of the mA and/or kV according to patient size and/or use of iterative reconstruction technique. CONTRAST:  OMNIPAQUE IOHEXOL 300 MG/ML  SOLN COMPARISON:  CT pelvis 12/25/2022 FINDINGS: Urinary Tract:  No abnormality visualized. Bowel:  Unremarkable visualized pelvic bowel loops. Vascular/Lymphatic: Reactive inguinal lymph nodes. No significant vascular abnormality seen. Reproductive: Redemonstrated soft tissue stranding on the right greater than left perineum. This extends into the scrotum where there is skin thickening greater on the right. There is new soft tissue gas within the posterior scrotum. Right-greater-than-left small hydroceles. No organized abscess. Other:  No free intraperitoneal fluid or air. Musculoskeletal: No acute osseous abnormality. IMPRESSION: Constellation of findings are concerning for necrotizing infection the scrotum. No organized abscess. Critical Value/emergent results were called by telephone at the time of interpretation on 12/28/2022 at 10:48 pm to provider [facility] , who verbally acknowledged these results. Electronically Signed   By: Minerva Fester M.D.   On: 12/28/2022 22:57    Assessment & Plan: 42 year old diabetic male on [place] admitted with Fournier's gangrene and severe hyponatremia s/p perineal debridement with Dr. Alvester Morin yesterday.  He is clinically improving today on empiric antibiotics.  Patient was premedicated in advance of my arrival today.  I removed his surgical dressing in its entirety and thoroughly inspected the wound bed.  There were some dusky borders at the inferior and right lateral margins.  No purulence,  crepitus, or malodor noted.  Wound photos taken, see media tab.  I also obtained a wound swab for culture per ID request.  I replaced his dressing with 4 x 4 gauze pads moistened with sterile saline and covered the wound with an ABD pad and replace his mesh underwear.  Recommendations: -Please keep n.p.o. in advance of possible second look with Dr. Apolinar Junes today; TBD per review of photos -Pain control per primary team -Antibiotics and wound culture per ID -Will continue to follow with daily dressing changes for now; will anticipate training loved ones on dressing exchanges to be performed after discharge.  Carman Ching, PA-C 12/30/2022

## 2022-12-30 NOTE — TOC Progression Note (Signed)
Transition of Care [facility]) - Progression Note    Patient Details  Name: Steve [name] MRN: [id number] Date of Birth: 03-Jun-1981  Transition of Care [facility]) CM/SW Contact  Kreg Shropshire, RN Phone Number: 12/30/2022, 11:49 AM  Clinical Narrative:     Cm received consult that pt will need [facility] RN for wound care. Pt has no preference on [facility] company. Cm confirmed with referral sent to [facility] from [facility]. Jeralene Peters of [facility] stated they can take pt for services. Cm will continue to follow for needs.       Expected Discharge Plan and Services                                     [facility] Agency: [facility] Date [facility] Agency Contacted: 12/30/22 Time HH Agency Contacted: 1147 Representative spoke with at [facility] Agency: Jeralene Peters of [facility]   Social Determinants of Health (SDOH) Interventions SDOH Screenings   Food Insecurity: No Food Insecurity (12/29/2022)  Housing: Low Risk  (12/29/2022)  Recent Concern: Housing - Medium Risk (12/03/2022)  Transportation Needs: No Transportation Needs (12/29/2022)  Utilities: At Risk (12/29/2022)  Alcohol Screen: Low Risk  (12/02/2022)  Depression (PHQ2-9): Low Risk  (12/02/2022)  Recent Concern: Depression (PHQ2-9) - Medium Risk (11/18/2022)  Financial Resource Strain: Medium Risk (12/02/2022)  Physical Activity: Sufficiently Active (12/02/2022)  Social Connections: Socially Isolated (12/02/2022)  Stress: No Stress Concern Present (12/02/2022)  Tobacco Use: High Risk (12/29/2022)    Readmission Risk Interventions     No data to display

## 2022-12-30 NOTE — Progress Notes (Signed)
Classed down to floor care. No surgery today. Aerobic an anaerobic cultures of scrotal area sent to lab. Patient had multiple male visitors that spoiled patient. Patient appears not to be serious about diabetes. Diabetic coordinator to visit tomorrow to educate patient about insulin etc. Patient refused to talk with her today.

## 2022-12-30 NOTE — Progress Notes (Signed)
[facility]  - [place] [facility]   PATIENT NAME: Steve [name]    MR#:  [id number]  DATE OF BIRTH:  1980/09/22  SUBJECTIVE:  no family at bedside. Patient came in with redness and pain over the perineal/scrotal area. Develoed Fournier's gangrene. Was taken to the OR for necrotizing fasciitis/fournier's gangrene  No fever today Tolerating some po diet   VITALS:  Blood pressure 136/78, pulse 72, temperature 98.3 F (36.8 C), resp. rate 19, height 6\' 1"  (1.854 m), weight 107 kg, SpO2 97%.  PHYSICAL EXAMINATION:   GENERAL:  42 y.o.-year-old patient with no acute distress.  LUNGS: Normal breath sounds bilaterally, no wheezing CARDIOVASCULAR: S1, S2 normal. No murmur   ABDOMEN: Soft, nontender, nondistended. Bowel sounds present. Packing + in the perineal area  FOLEY+ On 12/30/2022  EXTREMITIES: No  edema b/l.    NEUROLOGIC: nonfocal  patient is alert and awake  LABORATORY PANEL:  CBC Recent Labs  Lab 12/29/22 0605  WBC 4.5  HGB 12.1*  HCT 34.7*  PLT 110*    Chemistries  Recent Labs  Lab 12/28/22 2206 12/29/22 0605 12/29/22 0855 12/30/22 0854  NA 117* 121*   < > 126*  K 3.8 3.6  --   --   CL 85* 88*  --   --   CO2 22 23  --   --   GLUCOSE 310* 261*  --   --   BUN 15 17  --   --   CREATININE 0.96 1.23  --   --   CALCIUM 8.0* 7.9*  --   --   AST 94*  --   --   --   ALT 121*  --   --   --   ALKPHOS 232*  --   --   --   BILITOT 2.9*  --   --   --    < > = values in this interval not displayed.   Cardiac Enzymes No results for input(s): "TROPONINI" in the last 168 hours. RADIOLOGY:  CT PELVIS W CONTRAST  Result Date: 12/28/2022 CLINICAL DATA:  Abscess on genitals. EXAM: CT PELVIS WITH CONTRAST TECHNIQUE: Multidetector CT imaging of the pelvis was performed using the standard protocol following the bolus administration of intravenous contrast. RADIATION DOSE REDUCTION: This exam was performed according to the departmental dose-optimization  program which includes automated exposure control, adjustment of the mA and/or kV according to patient size and/or use of iterative reconstruction technique. CONTRAST:  OMNIPAQUE IOHEXOL 300 MG/ML  SOLN COMPARISON:  CT pelvis 12/25/2022 FINDINGS: Urinary Tract:  No abnormality visualized. Bowel:  Unremarkable visualized pelvic bowel loops. Vascular/Lymphatic: Reactive inguinal lymph nodes. No significant vascular abnormality seen. Reproductive: Redemonstrated soft tissue stranding on the right greater than left perineum. This extends into the scrotum where there is skin thickening greater on the right. There is new soft tissue gas within the posterior scrotum. Right-greater-than-left small hydroceles. No organized abscess. Other:  No free intraperitoneal fluid or air. Musculoskeletal: No acute osseous abnormality. IMPRESSION: Constellation of findings are concerning for necrotizing infection the scrotum. No organized abscess. Critical Value/emergent results were called by telephone at the time of interpretation on 12/28/2022 at 10:48 pm to provider [place] , who verbally acknowledged these results. Electronically Signed   By: Minerva Fester M.D.   On: 12/28/2022 22:57    Assessment and Plan  Steve [name] is a 42 y.o. Caucasian male with medical history significant for type 2 diabetes mellitus, GERD, essential hypertension  and gastritis, presented to the emergency room with acute onset of worsening abscess on his genitals for which he has been seen in the ER couple of days ago and placed on Septra for so far without improvement.  It has become more uncomfortable and more swollen.  He stated that he pushed the hair pin into it to alleviate the swelling and increased purulent drainage so that he will feel better   CT pelvis with contrast showed Constellation of findings are concerning for necrotizing infection the scrotum. No organized abscess.   Fournier's gangrene of scrotum Sepsis POA -  s/p emergent post debridement of his fournier's gangrene. -  IV Zosyn and vancomycin--change to IV linezolid + Zosyn --ID consult in am - BC were not sent from ER - Pain management will be provided. - Urology input with dr Alvester Morin appreciated.  --7/29--dressing change done by Urology. No need for debridement today. NPO tonite . Send WC --ID consultation with Dr Montez Hageman    Severe Hyponatremia --117--118--121--122--127 - cont hydration with IV normal saline for now and obtain hyponatremia workup. --consider Nephrology input if sodium does not improve --mentation stable    Type 2 diabetes mellitus with hyperglycemia, without long-term current use of insulin (HCC) - The patient will be placed on supplement coverage with resistant subcutaneous NovoLog. - semglee +aspart   Dyslipidemia -  continue statin therapy.   Essential hypertension - apparently diet managed and his BP has been fairly controlled.   GERD without esophagitis - continue PPI therapy.  Transfer out of ICU  Procedures: I and D perineal area Family communication :pt says family is informed Consults :Urology CODE STATUS:  DVT Prophylaxis : Level of care: Med-Surg Status is: Inpatient Remains inpatient appropriate because: sepsis    TOTAL TIME TAKING CARE OF THIS PATIENT: 35 minutes.  >50% time spent on counselling and coordination of care  Note: This dictation was prepared with Dragon dictation along with smaller phrase technology. Any transcriptional errors that result from this process are unintentional.  [name] M.D    [facility]   CC: Primary care physician; Care, [facility]

## 2022-12-30 NOTE — Inpatient Diabetes Management (Addendum)
Inpatient Diabetes Program Recommendations  AACE/ADA: New Consensus Statement on Inpatient Glycemic Control (2015)  Target Ranges:  Prepandial:   less than 140 mg/dL      Peak postprandial:   less than 180 mg/dL (1-2 hours)      Critically ill patients:  140 - 180 mg/dL   Lab Results  Component Value Date   GLUCAP 213 (H) 12/30/2022   HGBA1C 13.5 (H) 12/29/2022    Latest Reference Range & Units 12/29/22 07:42 12/29/22 11:35 12/29/22 15:44 12/29/22 20:25 12/29/22 22:09 12/30/22 07:48  Glucose-Capillary 70 - 99 mg/dL 161 (H) 096 (H) 045 (H) 163 (H) 161 (H) 213 (H)  (H): Data is abnormally high  Review of Glycemic Control  Diabetes history: DM2 Outpatient Diabetes medications:Farxiga 10 mg started 11/18/22 [name] listed as note taking) Current orders for Inpatient glycemic control: Semglee 20 units every day, Novolog 8 units meal coverage, Novolog 0-20 units qid  Inpatient Diabetes Program Recommendations:   Noted [name] listed as started 11/18/22 so will need to be discontinued @ discharge home. Please consider: -Decrease Novolog correction to 0-9 units q 4 hrs. While NPO and meal coverage on hold  Spoke with patient @ bedside regarding A1c of 10.0 (average blood glucose 341 over the past 2-3 months). Discussed patient will need to be on insulin @ this time and patient states willingness to comply and take as prescribe. Patient requests to learn the insulin pen in the morning due to he is hungry and doesn't feel well. Nurses, please allow patient to give his insulin injections while in the hospital in preparation for discharge.  Thank you, Steve [name] Sharmin Foulk, RN, MSN, CDE  Diabetes Coordinator Inpatient Glycemic Control Team Team Pager [phone] (8am-5pm) 12/30/2022 11:12 AM

## 2022-12-30 NOTE — Progress Notes (Signed)
At beginning of shift patient requested lemon lime soda, I said I would bring him a diet one, He stated that he wanted a regular soda, I reminded him that he is diabetic and that his current issue is from uncontrolled diabetes and that he needs to truly cut out sugar as much as possible. He stated "I know, but my sugar was 117!" I agreed with him about the cbg level, but still declined to get him a regular soda. He agreed to water.

## 2022-12-30 NOTE — Progress Notes (Signed)
Reached out to [facility] via Chat inquiring about Patients sodium level, 121, now 127. Orders for a Bolus with an increase of continuous Normal Saline to 150 ml. Will continue to monitor.

## 2022-12-31 ENCOUNTER — Other Ambulatory Visit (HOSPITAL_COMMUNITY): Payer: Self-pay

## 2022-12-31 DIAGNOSIS — E871 Hypo-osmolality and hyponatremia: Secondary | ICD-10-CM | POA: Diagnosis not present

## 2022-12-31 DIAGNOSIS — N493 Fournier gangrene: Secondary | ICD-10-CM | POA: Diagnosis not present

## 2022-12-31 DIAGNOSIS — E1165 Type 2 diabetes mellitus with hyperglycemia: Secondary | ICD-10-CM | POA: Diagnosis not present

## 2022-12-31 DIAGNOSIS — Z794 Long term (current) use of insulin: Secondary | ICD-10-CM | POA: Diagnosis not present

## 2022-12-31 LAB — GLUCOSE, CAPILLARY
Glucose-Capillary: 248 mg/dL — ABNORMAL HIGH (ref 70–99)
Glucose-Capillary: 209 mg/dL — ABNORMAL HIGH (ref 70–99)
Glucose-Capillary: 234 mg/dL — ABNORMAL HIGH (ref 70–99)
Glucose-Capillary: 268 mg/dL — ABNORMAL HIGH (ref 70–99)
Glucose-Capillary: 246 mg/dL — ABNORMAL HIGH (ref 70–99)

## 2022-12-31 MED ORDER — HYDROCODONE-ACETAMINOPHEN 5-325 MG PO TABS
1.0000 | ORAL_TABLET | Freq: Four times a day (QID) | ORAL | Status: DC | PRN
  Administered 2022-12-31 – 2023-01-03 (×4): 2 via ORAL
  Administered 2023-01-04: 1 via ORAL
  Administered 2023-01-04 – 2023-01-05 (×2): 2 via ORAL
  Administered 2023-01-06: 1 via ORAL
  Administered 2023-01-06: 2 via ORAL
  Filled 2022-12-31: qty 1
  Filled 2022-12-31 (×4): qty 2
  Filled 2022-12-31: qty 1
  Filled 2022-12-31 (×3): qty 2

## 2022-12-31 MED ORDER — POLYETHYLENE GLYCOL 3350 17 G PO PACK
17.0000 g | PACK | Freq: Every day | ORAL | Status: DC
  Administered 2022-12-31 – 2023-01-06 (×4): 17 g via ORAL
  Filled 2022-12-31 (×7): qty 1

## 2022-12-31 MED ORDER — ROSUVASTATIN CALCIUM 10 MG PO TABS
5.0000 mg | ORAL_TABLET | Freq: Every day | ORAL | Status: DC
  Administered 2022-12-31 – 2023-01-06 (×7): 5 mg via ORAL
  Filled 2022-12-31 (×8): qty 1

## 2022-12-31 MED ORDER — INSULIN GLARGINE-YFGN 100 UNIT/ML ~~LOC~~ SOLN
25.0000 [IU] | Freq: Every day | SUBCUTANEOUS | Status: DC
  Administered 2023-01-01 – 2023-01-06 (×6): 25 [IU] via SUBCUTANEOUS
  Filled 2022-12-31 (×6): qty 0.25

## 2022-12-31 NOTE — Progress Notes (Signed)
Urology Inpatient Progress Note  Subjective: No acute events overnight. He is afebrile, VSS. Wound culture pending with rare Gram negative rods, on antibiotics as below. Foley catheter in place draining clear, yellow urine. No acute concerns today.  Anti-infectives: Anti-infectives (From admission, onward)    Start     Dose/Rate Route Frequency Ordered Stop   12/29/22 1600  vancomycin (VANCOREADY) IVPB 1500 mg/300 mL  Status:  Discontinued        1,500 mg 150 mL/hr over 120 Minutes Intravenous Every 12 hours 12/29/22 1014 12/29/22 1329   12/29/22 1430  linezolid (ZYVOX) IVPB 600 mg        600 mg 300 mL/hr over 60 Minutes Intravenous Every 12 hours 12/29/22 1329     12/29/22 0600  piperacillin-tazobactam (ZOSYN) IVPB 3.375 g  Status:  Discontinued        3.375 g 100 mL/hr over 30 Minutes Intravenous Every 6 hours 12/29/22 0254 12/29/22 0257   12/29/22 0600  piperacillin-tazobactam (ZOSYN) IVPB 3.375 g        3.375 g 12.5 mL/hr over 240 Minutes Intravenous Every 8 hours 12/29/22 0257     12/29/22 0400  vancomycin (VANCOREADY) IVPB 1250 mg/250 mL  Status:  Discontinued        1,250 mg 166.7 mL/hr over 90 Minutes Intravenous Every 8 hours 12/29/22 0304 12/29/22 1014   12/29/22 0300  vancomycin (VANCOCIN) IVPB 1000 mg/200 mL premix  Status:  Discontinued        1,000 mg 200 mL/hr over 60 Minutes Intravenous  Once 12/29/22 0254 12/29/22 0304   12/28/22 2215  piperacillin-tazobactam (ZOSYN) IVPB 3.375 g        3.375 g 100 mL/hr over 30 Minutes Intravenous  Once 12/28/22 2204 12/28/22 2343   12/28/22 2215  vancomycin (VANCOCIN) IVPB 1000 mg/200 mL premix        1,000 mg 200 mL/hr over 60 Minutes Intravenous  Once 12/28/22 2204 12/29/22 0257       Current Facility-Administered Medications  Medication Dose Route Frequency Provider Last Rate Last Admin   acetaminophen (TYLENOL) tablet 650 mg  650 mg Oral Q6H PRN [name], [name], MD   650 mg at 12/29/22 2045   Or   acetaminophen  (TYLENOL) suppository 650 mg  650 mg Rectal Q6H PRN [name], [name], MD       Chlorhexidine Gluconate Cloth 2 % PADS 6 each  6 each Topical Daily [name], MD   6 each at 12/31/22 1030   enoxaparin (LOVENOX) injection 50 mg  50 mg Subcutaneous Q24H [name], [name], MD   50 mg at 12/30/22 2301   fentaNYL (SUBLIMAZE) injection 25 mcg  25 mcg Intravenous Q4H PRN [name], [name], MD   25 mcg at 12/31/22 1146   HYDROcodone-acetaminophen (NORCO/VICODIN) 5-325 MG per tablet 1-2 tablet  1-2 tablet Oral Q6H PRN [name], MD   2 tablet at 12/31/22 1236   insulin aspart (novoLOG) injection 0-9 Units  0-9 Units Subcutaneous Q4H [name], [name]   3 Units at 12/31/22 1147   [START ON 01/01/2023] insulin glargine-yfgn (SEMGLEE) injection 25 Units  25 Units Subcutaneous Daily [name], MD       ketorolac (TORADOL) 15 MG/ML injection 15 mg  15 mg Intravenous Q6H PRN [name], [name], MD   15 mg at 12/31/22 0744   linezolid (ZYVOX) IVPB 600 mg  600 mg Intravenous Q12H Cordella Register A, [name] 300 mL/hr at 12/31/22 0958 600 mg at 12/31/22 0958   magnesium  hydroxide (MILK OF MAGNESIA) suspension 30 mL  30 mL Oral Daily PRN [name], [name], MD       ondansetron [facility]) tablet 4 mg  4 mg Oral Q6H PRN [name], [name], MD       Or   ondansetron [facility]) injection 4 mg  4 mg Intravenous Q6H PRN [name], [name], MD       Oral care mouth rinse  15 mL Mouth Rinse PRN [name], [name], MD       piperacillin-tazobactam (ZOSYN) IVPB 3.375 g  3.375 g Intravenous Q8H Otelia Sergeant, [name] 12.5 mL/hr at 12/31/22 0531 3.375 g at 12/31/22 0531   rosuvastatin (CRESTOR) tablet 5 mg  5 mg Oral Daily [name], MD   5 mg at 12/31/22 1148   traZODone (DESYREL) tablet 25 mg  25 mg Oral QHS PRN [name], [name], MD       Objective: Vital signs in last 24 hours: Temp:  [98 F (36.7 C)-98.9 F (37.2 C)] 98.4 F (36.9 C) (07/30 0902) Pulse Rate:  [59-73] 62 (07/30 0902) Resp:  [12-20] 18 (07/30 0902) BP: (129-147)/(68-85) 141/85 (07/30 0902) SpO2:   [97 %-100 %] 99 % (07/30 0902)  Intake/Output from previous day: 07/29 0701 - 07/30 0700 In: -  Out: 1450 [Urine:1450] Intake/Output this shift: Total I/O In: -  Out: 700 [Urine:700]  Physical Exam Vitals and nursing note reviewed.  Constitutional:      General: He is not in acute distress.    Appearance: He is not ill-appearing, toxic-appearing or diaphoretic.  HENT:     Head: Normocephalic and atraumatic.  Pulmonary:     Effort: Pulmonary effort is normal. No respiratory distress.  Genitourinary:    Comments: Bilateral viable appearing testicles. Posterior hemiscrotum surgically absent. Dusky portion along right lateral surgical margin is resolving consistent with bruising. Fibrinous exudate along the lateral and inferior margins appears stable, scant bleeding with dissection at the bedside. Skin:    General: Skin is warm and dry.  Neurological:     Mental Status: He is alert and oriented to person, place, and time.  Psychiatric:        Mood and Affect: Mood normal.        Behavior: Behavior normal.    Lab Results:  Recent Labs    12/28/22 2118 12/29/22 0605  WBC 4.2 4.5  HGB 13.3 12.1*  HCT 38.2* 34.7*  PLT 116* 110*   BMET Recent Labs    12/28/22 2206 12/29/22 0605 12/29/22 0855 12/31/22 0451 12/31/22 0859  NA 117* 121*   < > 127* 127*  K 3.8 3.6  --   --   --   CL 85* 88*  --   --   --   CO2 22 23  --   --   --   GLUCOSE 310* 261*  --   --   --   BUN 15 17  --   --   --   CREATININE 0.96 1.23  --   --   --   CALCIUM 8.0* 7.9*  --   --   --    < > = values in this interval not displayed.   PT/INR Recent Labs    12/29/22 0605  LABPROT 13.5  INR 1.0   Assessment & Plan: 42 year old diabetic male on [place] admitted with Fournier's gangrene and severe hyponatremia s/p perineal debridement with Dr. Alvester Morin 2 days ago.  He continues to clinically improve on empiric antibiotics.  Dr.  Apolinar Junes removed his Foley catheter at the bedside today, patient  tolerated well.  Dr. Apolinar Junes, Michiel Cowboy, and I then removed his dressing in its entirety and thoroughly inspected the wound bed, which appeared viable.  Dr. Apolinar Junes proceeded with some light dissection of fibrinous exudate along the wound margins, with healthy appearing bleeding tissue immediately underneath.  Patient tolerated well.  Upon completion, we replaced his dressing with moist 4 x 4 gauze pads, covered him with an ABD pad, and replaced his mesh underwear.  He will ultimately require wound closure, but unfortunately we do not have this capability with our plastics team here.  Will reach out to colleagues at [facility] to discuss possible transfer once medically stabilized from the infection standpoint.  Recommendations: -Advance diet, no plans for second look in the OR -Daily wet-to-dry dressing changes, urology to perform -Continue antibiotics per ID -Urology to continue dispo planning; anticipate at least outpatient referral to tertiary care center  [facility], PA-C 12/31/2022

## 2022-12-31 NOTE — TOC Benefit Eligibility Note (Signed)
Pharmacy Patient Advocate Encounter  Insurance verification completed.    The patient is insured through  [employer] Part D     Ran test claim for Levemir Flexpen/Vial. Currently a quantity of 66mL/10mL is a 30 day supply and the co-pay is $0.00 .   Ran test claim for Lantus Solostar/Vial. Currently a quantity of 53mL/10mL is a 30 day supply and the co-pay is $0.00 .   Ran test claim for [employer]. Currently a quantity of 82mL/4.5mL is a 30 day supply and the co-pay is $0.00 .   Ran test claim for Tresiba 100/200. Currently a quantity of 57mL/9mL is a 30 day supply and the co-pay is $0.00 .   Ran test claim for Humulin-R. Currently a quantity of 10mL is a 30 day supply and the co-pay is $0.00 .   This test claim was processed through [facility]- copay amounts may vary at other pharmacies due to pharmacy/plan contracts, or as the patient moves through the different stages of their insurance plan.

## 2022-12-31 NOTE — TOC Benefit Eligibility Note (Signed)
Pharmacy Patient Advocate Encounter  Insurance verification completed.    The patient is insured through  [employer] Part D    Ran test claim for [employer] 3 Reader. Currently a quantity of 1 is a 90 day supply and the co-pay is $0.00 .   Ran test claim for [employer]. Currently a quantity of 2 is a 28 day supply and the co-pay is $0.00 .   This test claim was processed through [facility]- copay amounts may vary at other pharmacies due to pharmacy/plan contracts, or as the patient moves through the different stages of their insurance plan.

## 2022-12-31 NOTE — Inpatient Diabetes Management (Addendum)
Inpatient Diabetes Program Recommendations  AACE/ADA: New Consensus Statement on Inpatient Glycemic Control (2015)  Target Ranges:  Prepandial:   less than 140 mg/dL      Peak postprandial:   less than 180 mg/dL (1-2 hours)      Critically ill patients:  140 - 180 mg/dL   Lab Results  Component Value Date   GLUCAP 248 (H) 12/31/2022   HGBA1C 13.5 (H) 12/29/2022    Latest Reference Range & Units 12/30/22 07:48 12/30/22 12:17 12/30/22 16:24 12/30/22 20:29 12/30/22 21:35 12/30/22 23:55 12/31/22 05:29  Glucose-Capillary 70 - 99 mg/dL 841 (H) Novolog 7 units 212 (H) Novolog 3 units 250 (H) Novolog 3 units 264 (H) Novolog 5 units 284 (H) 261 (H) Novolog 5 units 248 (H) Novolog 3 units  (H): Data is abnormally high  Review of Glycemic Control  Diabetes history: DM2 Outpatient Diabetes medications:Farxiga 10 mg started 11/18/22 [name] listed as note taking) Current orders for Inpatient glycemic control: Semglee 20 units every day, Novolog 0-9 units q4hrs  Inpatient Diabetes Program Recommendations:   Noted pt. Is NPO due to procedure Please consider: -Increase Semglee to 25 units every day -Increase Novolog correction to 0-15 units q 4 hrs. While NPO if CBGs remain >180. -Add back Novolog meal coverage when eating  Educated patient and significant other on insulin pen use at home. Reviewed contents of insulin flexpen starter kit. Reviewed all steps if insulin pen including attachment of needle, 2-unit air shot, dialing up dose, giving injection, removing needle, disposal of sharps, storage of unused insulin, disposal of insulin etc. Patient and significant other able to provide successful return demonstration. Also reviewed troubleshooting with insulin pen. MD to give patient Rxs for insulin pens and insulin pen needles.  Also reviewed difference in short acting and long acting insulin and hypoglycemia protocol. Patient verbalized understanding.  Thank you, Steve [name] Shital Crayton, RN, MSN,  CDE  Diabetes Coordinator Inpatient Glycemic Control Team Team Pager [phone] (8am-5pm) 12/31/2022 8:55 AM

## 2022-12-31 NOTE — Progress Notes (Addendum)
[facility]  - [place] at [facility]   PATIENT NAME: Steve [name]    MR#:  [id number]  DATE OF BIRTH:  [birth year]  SUBJECTIVE:  Family friend at bedside. No fever today Sugars coming down. Pt now on insulin Awaiting dressing change by Dr Apolinar Junes today. NPO for now   VITALS:  Blood pressure (!) 141/85, pulse 62, temperature 98.4 F (36.9 C), temperature source Oral, resp. rate 18, height 6\' 1"  (1.854 m), weight 107 kg, SpO2 99%.  PHYSICAL EXAMINATION:   GENERAL:  42 y.o.-year-old patient with no acute distress.  LUNGS: Normal breath sounds bilaterally, no wheezing CARDIOVASCULAR: S1, S2 normal. No murmur   ABDOMEN: Soft, nontender, nondistended. Bowel sounds present. Packing + in the perineal area  FOLEY+ On 12/30/2022  On 12/31/22   EXTREMITIES: No  edema b/l.    NEUROLOGIC: nonfocal  patient is alert and awake  LABORATORY PANEL:  CBC Recent Labs  Lab 12/29/22 0605  WBC 4.5  HGB 12.1*  HCT 34.7*  PLT 110*    Chemistries  Recent Labs  Lab 12/28/22 2206 12/29/22 0605 12/29/22 0855 12/31/22 0859  NA 117* 121*   < > 127*  K 3.8 3.6  --   --   CL 85* 88*  --   --   CO2 22 23  --   --   GLUCOSE 310* 261*  --   --   BUN 15 17  --   --   CREATININE 0.96 1.23  --   --   CALCIUM 8.0* 7.9*  --   --   AST 94*  --   --   --   ALT 121*  --   --   --   ALKPHOS 232*  --   --   --   BILITOT 2.9*  --   --   --    < > = values in this interval not displayed.   Cardiac Enzymes No results for input(s): "TROPONINI" in the last 168 hours. RADIOLOGY:  No results found.  Assessment and Plan  Steve [name] is a 42 y.o. Caucasian male with medical history significant for type 2 diabetes mellitus, GERD, essential hypertension and gastritis, presented to the emergency room with acute onset of worsening abscess on his genitals for which he has been seen in the ER couple of days ago and placed on Septra for so far without improvement.  It has become more  uncomfortable and more swollen.  He stated that he pushed the hair pin into it to alleviate the swelling and increased purulent drainage so that he will feel better   CT pelvis with contrast showed Constellation of findings are concerning for necrotizing infection the scrotum. No organized abscess.   Fournier's gangrene of scrotum Sepsis POA - s/p emergent post debridement of his fournier's gangrene. -  IV Zosyn and vancomycin--change to IV linezolid + Zosyn --ID consult in am - BC were not sent from ER - Pain management will be provided. - Urology input with dr Alvester Morin appreciated.  --7/29--dressing change done by Urology. No need for debridement today. NPO tonite . Send WC --ID consultation with Dr Montez Hageman --WC GPR,GNR --7/30--awaiting wound check by Urology today    Severe Hyponatremia Hyperglycemia --117--118--121--122--127--125 - cont hydration with IV normal saline for now and obtain hyponatremia workup. --consider Nephrology input if sodium does not improve --mentation stable    Type 2 diabetes mellitus with hyperglycemia, without long-term current use of insulin (HCC), uncontrolled - The patient  will be placed on supplement coverage with resistant subcutaneous NovoLog. - semglee + SSI _-DM coordinator input noted  Dyslipidemia -  continue statin therapy.   Essential hypertension - apparently diet managed and his BP has been fairly controlled.   GERD without esophagitis - continue PPI therapy.    Procedures: I and D perineal area 12/29/22 Family communication :family friend at bedside Consults :Urology CODE STATUS:  DVT Prophylaxis : Level of care: Med-Surg Status is: Inpatient Remains inpatient appropriate because: sepsis    TOTAL TIME TAKING CARE OF THIS PATIENT: 35 minutes.  >50% time spent on counselling and coordination of care  Note: This dictation was prepared with Dragon dictation along with smaller phrase technology. Any transcriptional errors that result  from this process are unintentional.  [name] M.D    [facility]   CC: Primary care physician; Care, [facility]

## 2022-12-31 NOTE — Progress Notes (Signed)
   Date of Admission:  12/28/2022     ID: Steve [name] is a 42 y.o. male  Principal Problem:   Fournier's gangrene of scrotum Active Problems:   Type 2 diabetes mellitus with hyperglycemia, without long-term current use of insulin (HCC)   Hyponatremia   Dyslipidemia   GERD without esophagitis   Essential hypertension   Sepsis due to cellulitis (HCC)    Subjective: Feeling better Out of ICU Family at bed side  Medications:   Chlorhexidine Gluconate Cloth  6 each Topical Daily   enoxaparin (LOVENOX) injection  50 mg Subcutaneous Q24H   insulin aspart  0-9 Units Subcutaneous Q4H   [START ON 01/01/2023] insulin glargine-yfgn  25 Units Subcutaneous Daily   polyethylene glycol  17 g Oral Daily   rosuvastatin  5 mg Oral Daily    Objective: Vital signs in last 24 hours: Patient Vitals for the past 24 hrs:  BP Temp Temp src Pulse Resp SpO2  12/31/22 0902 (!) 141/85 98.4 F (36.9 C) Oral 62 18 99 %  12/31/22 0406 (!) 146/74 98 F (36.7 C) Oral 60 20 99 %  12/31/22 0358 129/68 98.5 F (36.9 C) Oral (!) 59 16 98 %  12/30/22 2134 131/80 98.9 F (37.2 C) Oral 69 16 99 %  12/30/22 2100 136/80 -- -- 66 17 97 %  12/30/22 2000 (!) 147/77 98.2 F (36.8 C) Oral -- -- --  12/30/22 1900 -- -- -- 73 18 98 %  12/30/22 1800 -- -- -- 71 15 100 %  12/30/22 1758 -- -- -- 71 19 100 %  12/30/22 1700 -- -- -- 69 13 99 %      PHYSICAL EXAM:  General: Alert, cooperative, no distress, appears stated age.  Lungs: Clear to auscultation bilaterally. No Wheezing or Rhonchi. No rales. Heart: Regular rate and rhythm, no murmur, rub or gallop. Abdomen: Soft, non-tender,not distended. Bowel sounds normal. No masses Extremities: atraumatic, no cyanosis. No edema. No clubbing Skin: No rashes or lesions. Or bruising Lymph: Cervical, supraclavicular normal. Neurologic: Grossly non-focal  Lab Results    Latest Ref Rng & Units 12/29/2022    6:05 AM 12/28/2022    9:18 PM 12/25/2022    9:44 AM  CBC   WBC 4.0 - 10.5 K/uL 4.5  4.2  9.5   Hemoglobin 13.0 - 17.0 g/dL 57.3  22.0  25.4   Hematocrit 39.0 - 52.0 % 34.7  38.2  41.0   Platelets 150 - 400 K/uL 110  116  160        Latest Ref Rng & Units 12/31/2022    8:59 AM 12/31/2022    4:51 AM 12/31/2022   12:56 AM  CMP  Sodium 135 - 145 mmol/L 127  127  126       Microbiology: Scrotum wound culture pending     Assessment/Plan: Fournier's gangrene of the scrotum Extensive debridement has been done Patient is currently on Zosyn and linezolid continue Cultures sent yesterday    Diabetes mellitus poorly controlled On insulin currently   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

## 2023-01-01 ENCOUNTER — Telehealth (HOSPITAL_COMMUNITY): Payer: Self-pay | Admitting: Pharmacy Technician

## 2023-01-01 DIAGNOSIS — Z794 Long term (current) use of insulin: Secondary | ICD-10-CM | POA: Diagnosis not present

## 2023-01-01 DIAGNOSIS — N493 Fournier gangrene: Secondary | ICD-10-CM | POA: Diagnosis not present

## 2023-01-01 DIAGNOSIS — E1165 Type 2 diabetes mellitus with hyperglycemia: Secondary | ICD-10-CM | POA: Diagnosis not present

## 2023-01-01 LAB — GLUCOSE, CAPILLARY
Glucose-Capillary: 163 mg/dL — ABNORMAL HIGH (ref 70–99)
Glucose-Capillary: 204 mg/dL — ABNORMAL HIGH (ref 70–99)
Glucose-Capillary: 221 mg/dL — ABNORMAL HIGH (ref 70–99)
Glucose-Capillary: 232 mg/dL — ABNORMAL HIGH (ref 70–99)
Glucose-Capillary: 244 mg/dL — ABNORMAL HIGH (ref 70–99)
Glucose-Capillary: 266 mg/dL — ABNORMAL HIGH (ref 70–99)
Glucose-Capillary: 296 mg/dL — ABNORMAL HIGH (ref 70–99)

## 2023-01-01 MED ORDER — SODIUM CHLORIDE 0.9 % IV SOLN: INTRAVENOUS | Status: AC

## 2023-01-01 MED ORDER — POTASSIUM CHLORIDE CRYS ER 20 MEQ PO TBCR
40.0000 meq | EXTENDED_RELEASE_TABLET | Freq: Once | ORAL | Status: AC
  Administered 2023-01-01: 40 meq via ORAL
  Filled 2023-01-01: qty 2

## 2023-01-01 MED ORDER — FLUCONAZOLE 100 MG PO TABS
400.0000 mg | ORAL_TABLET | Freq: Every day | ORAL | Status: DC
  Administered 2023-01-01 – 2023-01-06 (×6): 400 mg via ORAL
  Filled 2023-01-01 (×6): qty 4

## 2023-01-01 NOTE — Progress Notes (Signed)
Progress Note   Patient: Steve [name]:[id number] DOB: 25-Feb-1981 DOA: 12/28/2022     3 DOS: the patient was seen and examined on 01/01/2023   Brief hospital course:  Steve [name] is a 42 y.o. Caucasian male with medical history significant for type 2 diabetes mellitus, GERD, essential hypertension and gastritis, presented to the emergency room with acute onset of worsening abscess on his genitals for which he has been seen in the ER couple of days ago and placed on Septra for so far without improvement.  It has become more uncomfortable and more swollen.  He stated that he pushed the air pin into it to alleviate the swelling and increased purulent drainage so that he will feel better.  He denies any fever or chills.  No nausea or vomiting or abdominal pain.  No chest pain or palpitations.  No cough or wheezing or dyspnea.  No dysuria, oliguria or hematuria, urgency or frequency or flank pain.  Blood glucose levels have been elevated.  He had a pelvic CT scan revealing suspicious foreign years gangrene.  Use hyperglycemic as well is hyponatremic.  He was taken to the OR by Dr. Alvester Morin and underwent debridement.   ED Course: When he came to the ER, was's were within normal and later BP was 146/79, heart rate 93 and respiratory rate 22.  Labs revealed a sodium of 118 around was 117 and a chloride of 84 that later on was 85 with blood glucose of 318 and later 310, calcium of 8.1 and later 8, alk phos of 233 and later 232 with albumin of 2.8 and later the same, AST 97 and later 94, ALT 124 and later 121 and total bili of 3 and later 2.9.  Lactic acid was 1.8 and CBC showed microcytosis and was otherwise unremarkable. EKG as reviewed by me : None yet. Imaging: Pelvic CT with contrast revealed the following: Constellation of findings are concerning for necrotizing infection the scrotum. No organized abscess.   The patient was given 4 mg of IV morphine sulfate and 4 mg of IV Zofran as well as IV  Zosyn and vancomycin.  He underwent debridement of his Fournier's gangrene as mentioned above by Dr. Alvester Morin.  We were asked to consult and admit the patient postoperatively.  He will be admitted to a stepdown unit bed from the PACU for further evaluation and management.    Assessment and Plan: Sepsis POA  Fournier's gangrene of scrotum - s/p emergent post debridement of his fournier's gangrene. -  IV Zosyn and vancomycin--change to IV linezolid + Zosyn --Appreciate ID consult - BC were not sent from ER - Pain management  - Urology input with dr Alvester Morin appreciated.  --7/29--dressing change done by Urology. No need for debridement today.  --WC E coli on IV Zosyn --7/30--awaiting wound check by Urology today     Severe Hyponatremia Hyperglycemia --117--118--121--122--127--125 -- 128 -- Most likely secondary to hypoglycemia.  Improved but not back to normal values - cont hydration with IV normal saline for now and obtain hyponatremia workup. --consider Nephrology input if sodium does not improve --mentation stable    Type 2 diabetes mellitus with hyperglycemia, without long-term current use of insulin (HCC), uncontrolled - Hemoglobin A1c is 13.5 --Continue Semglee 25 units and sliding scale coverage --Patient will need insulin on discharge and will need to be educated on how to administer insulin. _-DM coordinator input noted   Dyslipidemia -  continue statin therapy.   Essential hypertension - apparently diet  managed and his BP has been fairly controlled.   GERD without esophagitis - continue PPI therapy.   Constipation Continue MiraLAX   Hypokalemia Supplement potassium         Subjective: Complains of pain in his scrotal area.  Physical Exam: Vitals:   12/31/22 1713 12/31/22 1953 01/01/23 0319 01/01/23 0748  BP: 135/71 135/70 (!) 143/85 (!) 156/94  Pulse: 64 62 63 (!) 57  Resp: 18 18 20 16   Temp: 97.8 F (36.6 C) 97.8 F (36.6 C) 98.2 F (36.8 C) 98.2 F  (36.8 C)  TempSrc:   Oral Oral  SpO2: 99% 98% 98% 98%  Weight:      Height:       GENERAL:  42 y.o.-year-old patient with no acute distress.  LUNGS: Normal breath sounds bilaterally, no wheezing CARDIOVASCULAR: S1, S2 normal. No murmur   ABDOMEN: Soft, nontender, nondistended. Bowel sounds present. Packing + in the perineal area  FOLEY+ On 12/30/2022   On 12/31/22    EXTREMITIES: No  edema b/l.    NEUROLOGIC: nonfocal  patient is alert and awake    Data Reviewed: Labs reviewed.  Sodium 128, potassium 3.2 There are no new results to review at this time.  Family Communication: Plan of care discussed with patient  Disposition: Status is: Inpatient Remains inpatient appropriate because: On IV antibiotics and receiving wound care  Planned Discharge Destination: Home    Time spent: 35 minutes  Author: Lucile Shutters, MD 01/01/2023 2:39 PM  For on call review [email or web address].

## 2023-01-01 NOTE — Telephone Encounter (Signed)
Pharmacy Patient Advocate Encounter  Received notification from [facility] that Prior Authorization for FreeStyle Libre 3 Reader device & FreeStyle Libre 3 Sensor has been APPROVED from 12/31/2022 to 06/03/2023. Ran test claim, Copay is $0.00  PA #/Case ID/Reference #: [id number] Key: [facility]

## 2023-01-01 NOTE — Consult Note (Signed)
   [facility] Inpatient Consult   01/01/2023  Steve [name] 11-22-1980 [id number]  Primary Care Provider:  Larae Grooms, NP  Patient is currently active with Care Management for chronic disease management services.  Patient has been engaged by a social work for [employer] known as [employer].  Our community based plan of care has focused on disease management and community resource support.   Patient will receive a post hospital call and will be evaluated for assessments and disease process education.   Plan: Discharge disposition pending. Inpatient Transition Of Care [TOC] team member to make aware that Care Management following.  Of note, Care Management services does not replace or interfere with any services that are needed or arranged by inpatient [facility] care management team.   For additional questions or referrals please contact:    Elliot Cousin, RN, [facility] Liaison [place]   Population Health Office Hours MTWF  8:00 am-6:00 pm Off on Thursday [phone] mobile [phone] [Office toll free line] Office Hours are M-F 8:30 - 5 pm 24 hour nurse advise line [phone] Concierge  [email or web address]

## 2023-01-01 NOTE — Progress Notes (Signed)
Date of Admission:  12/28/2022     ID: Steve [name] is a 42 y.o. male  Principal Problem:   Fournier's gangrene of scrotum Active Problems:   Type 2 diabetes mellitus with hyperglycemia, without long-term current use of insulin (HCC)   Hyponatremia   Dyslipidemia   GERD without esophagitis   Essential hypertension   Sepsis due to cellulitis (HCC)    Subjective: Feeling better Dressing changed   Medications:   enoxaparin (LOVENOX) injection  50 mg Subcutaneous Q24H   insulin aspart  0-9 Units Subcutaneous Q4H   insulin glargine-yfgn  25 Units Subcutaneous Daily   polyethylene glycol  17 g Oral Daily   rosuvastatin  5 mg Oral Daily    Objective: Vital signs in last 24 hours: Patient Vitals for the past 24 hrs:  BP Temp Temp src Pulse Resp SpO2  01/01/23 0748 (!) 156/94 98.2 F (36.8 C) Oral (!) 57 16 98 %  01/01/23 0319 (!) 143/85 98.2 F (36.8 C) Oral 63 20 98 %  12/31/22 1953 135/70 97.8 F (36.6 C) -- 62 18 98 %  12/31/22 1713 135/71 97.8 F (36.6 C) -- 64 18 99 %      PHYSICAL EXAM:  General: Alert, cooperative, no distress, appears stated age.  Lungs: Clear to auscultation bilaterally. No Wheezing or Rhonchi. No rales. Heart: Regular rate and rhythm, no murmur, rub or gallop. Abdomen: Soft, non-tender,not distended. Bowel sounds normal. No masses Extremities: atraumatic, no cyanosis. No edema. No clubbing Skin: No rashes or lesions. Or bruising Lymph: Cervical, supraclavicular normal. Neurologic: Grossly non-focal Scrotal wound reviewed Healthy tissue Both testes exposed   Lab Results    Latest Ref Rng & Units 12/29/2022    6:05 AM 12/28/2022    9:18 PM 12/25/2022    9:44 AM  CBC  WBC 4.0 - 10.5 K/uL 4.5  4.2  9.5   Hemoglobin 13.0 - 17.0 g/dL 40.9  81.1  91.4   Hematocrit 39.0 - 52.0 % 34.7  38.2  41.0   Platelets 150 - 400 K/uL 110  116  160        Latest Ref Rng & Units 01/01/2023    4:24 AM 12/31/2022    8:59 AM 12/31/2022    4:51 AM   CMP  Glucose 70 - 99 mg/dL 782     BUN 6 - 20 mg/dL 9     Creatinine 9.56 - 1.24 mg/dL 2.13     Sodium 086 - 578 mmol/L 128  127  127   Potassium 3.5 - 5.1 mmol/L 3.2     Chloride 98 - 111 mmol/L 97     CO2 22 - 32 mmol/L 26     Calcium 8.9 - 10.3 mg/dL 8.2         Microbiology: Scrotum wound culture ecoli-  Yeast Not finalized      Assessment/Plan: Fournier's gangrene of the scrotum Extensive debridement has been done Patient is currently on Zosyn and linezolid - can DC linezolid So far ecoli in culture resistant to oral antibiotics So will need IV antibiotics on discharge  Yeast in wound- start fluconazole Pt needs to be referred to plastic surgeon - appt need to be given to him before he is discharged Also he/family  will have to be taught how to dress the wound   Diabetes mellitus poorly controlled On insulin currently   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

## 2023-01-01 NOTE — TOC Progression Note (Signed)
Transition of Care [facility]) - Progression Note    Patient Details  Name: Steve [name] MRN: [id number] Date of Birth: [birth year]  Transition of Care [facility]) CM/SW Contact  Truddie Hidden, RN Phone Number: 01/01/2023, 10:37 AM  Clinical Narrative:    TOC assessing for ongoing needs and discharge planning.        Expected Discharge Plan and Services                                     [facility] Agency: [facility] Date [facility] Agency Contacted: 12/30/22 Time HH Agency Contacted: 1147 Representative spoke with at [facility] Agency: Jeralene Peters of [facility]   Social Determinants of Health (SDOH) Interventions SDOH Screenings   Food Insecurity: No Food Insecurity (12/29/2022)  Housing: Low Risk  (12/29/2022)  Recent Concern: Housing - Medium Risk (12/03/2022)  Transportation Needs: No Transportation Needs (12/29/2022)  Utilities: At Risk (12/29/2022)  Alcohol Screen: Low Risk  (12/02/2022)  Depression (PHQ2-9): Low Risk  (12/02/2022)  Recent Concern: Depression (PHQ2-9) - Medium Risk (11/18/2022)  Financial Resource Strain: Medium Risk (12/02/2022)  Physical Activity: Sufficiently Active (12/02/2022)  Social Connections: Socially Isolated (12/02/2022)  Stress: No Stress Concern Present (12/02/2022)  Tobacco Use: High Risk (12/29/2022)    Readmission Risk Interventions     No data to display

## 2023-01-01 NOTE — Progress Notes (Signed)
Urology Consult Follow Up  Subjective: No acute events overnight.  He has not had a bowel movement in over a week and he is concerned about possible contamination when he has a bowel movement.  Nursing has started MiraLAX as he has not had BM in over a week.  While he is in the hospital nursing staff will assist him with bowel movements.  I advised him that when he is at home to lift his wound dressings and anterior fashion to assist in it not being contaminated with fecal matter when having bowel movements.  VSS afebrile  Wound cultures still pending.     Anti-infectives: Anti-infectives (From admission, onward)    Start     Dose/Rate Route Frequency Ordered Stop   12/29/22 1600  vancomycin (VANCOREADY) IVPB 1500 mg/300 mL  Status:  Discontinued        1,500 mg 150 mL/hr over 120 Minutes Intravenous Every 12 hours 12/29/22 1014 12/29/22 1329   12/29/22 1430  linezolid (ZYVOX) IVPB 600 mg        600 mg 300 mL/hr over 60 Minutes Intravenous Every 12 hours 12/29/22 1329     12/29/22 0600  piperacillin-tazobactam (ZOSYN) IVPB 3.375 g  Status:  Discontinued        3.375 g 100 mL/hr over 30 Minutes Intravenous Every 6 hours 12/29/22 0254 12/29/22 0257   12/29/22 0600  piperacillin-tazobactam (ZOSYN) IVPB 3.375 g        3.375 g 12.5 mL/hr over 240 Minutes Intravenous Every 8 hours 12/29/22 0257     12/29/22 0400  vancomycin (VANCOREADY) IVPB 1250 mg/250 mL  Status:  Discontinued        1,250 mg 166.7 mL/hr over 90 Minutes Intravenous Every 8 hours 12/29/22 0304 12/29/22 1014   12/29/22 0300  vancomycin (VANCOCIN) IVPB 1000 mg/200 mL premix  Status:  Discontinued        1,000 mg 200 mL/hr over 60 Minutes Intravenous  Once 12/29/22 0254 12/29/22 0304   12/28/22 2215  piperacillin-tazobactam (ZOSYN) IVPB 3.375 g        3.375 g 100 mL/hr over 30 Minutes Intravenous  Once 12/28/22 2204 12/28/22 2343   12/28/22 2215  vancomycin (VANCOCIN) IVPB 1000 mg/200 mL premix        1,000 mg 200 mL/hr  over 60 Minutes Intravenous  Once 12/28/22 2204 12/29/22 0257       Current Facility-Administered Medications  Medication Dose Route Frequency Provider Last Rate Last Admin   0.9 %  sodium chloride infusion   Intravenous Continuous Agbata, Tochukwu, MD       acetaminophen (TYLENOL) tablet 650 mg  650 mg Oral Q6H PRN [name], [name], MD   650 mg at 12/29/22 2045   Or   acetaminophen (TYLENOL) suppository 650 mg  650 mg Rectal Q6H PRN [name], [name], MD       enoxaparin (LOVENOX) injection 50 mg  50 mg Subcutaneous Q24H [name], [name], MD   50 mg at 12/31/22 2058   fentaNYL (SUBLIMAZE) injection 25 mcg  25 mcg Intravenous Q4H PRN [name], [name], MD   25 mcg at 01/01/23 0759   HYDROcodone-acetaminophen (NORCO/VICODIN) 5-325 MG per tablet 1-2 tablet  1-2 tablet Oral Q6H PRN [name], MD   2 tablet at 12/31/22 1842   insulin aspart (novoLOG) injection 0-9 Units  0-9 Units Subcutaneous Q4H [name], [name]   3 Units at 01/01/23 0800   insulin glargine-yfgn (SEMGLEE) injection 25 Units  25 Units Subcutaneous Daily Allena Katz,  Jearl Klinefelter, MD       ketorolac (TORADOL) 15 MG/ML injection 15 mg  15 mg Intravenous Q6H PRN [name], [name], MD   15 mg at 01/01/23 1191   linezolid (ZYVOX) IVPB 600 mg  600 mg Intravenous Q12H Jaynie Bream, [name] 300 mL/hr at 12/31/22 2057 600 mg at 12/31/22 2057   magnesium hydroxide (MILK OF MAGNESIA) suspension 30 mL  30 mL Oral Daily PRN [name], [name], MD       ondansetron [facility]) tablet 4 mg  4 mg Oral Q6H PRN [name], [name], MD       Or   ondansetron [facility]) injection 4 mg  4 mg Intravenous Q6H PRN [name], [name], MD       Oral care mouth rinse  15 mL Mouth Rinse PRN [name], [name], MD       piperacillin-tazobactam (ZOSYN) IVPB 3.375 g  3.375 g Intravenous Q8H Otelia Sergeant, [name] 12.5 mL/hr at 01/01/23 0633 3.375 g at 01/01/23 4782   polyethylene glycol (MIRALAX / GLYCOLAX) packet 17 g  17 g Oral Daily [name], MD   17 g at 12/31/22 1341   potassium chloride SA (KLOR-CON M) CR  tablet 40 mEq  40 mEq Oral Once Agbata, Tochukwu, MD       rosuvastatin (CRESTOR) tablet 5 mg  5 mg Oral Daily [name], MD   5 mg at 12/31/22 1148   traZODone (DESYREL) tablet 25 mg  25 mg Oral QHS PRN [name], [name], MD         Objective: Vital signs in last 24 hours: Temp:  [97.8 F (36.6 C)-98.2 F (36.8 C)] 98.2 F (36.8 C) (07/31 0748) Pulse Rate:  [57-64] 57 (07/31 0748) Resp:  [16-20] 16 (07/31 0748) BP: (135-156)/(70-94) 156/94 (07/31 0748) SpO2:  [98 %-99 %] 98 % (07/31 0748)  Intake/Output from previous day: 07/30 0701 - 07/31 0700 In: 240 [P.O.:240] Out: 1050 [Urine:1050] Intake/Output this shift: No intake/output data recorded.   Physical Exam Constitutional:      General: He is not in acute distress.    Appearance: Normal appearance. He is not ill-appearing, toxic-appearing or diaphoretic.  HENT:     Head: Normocephalic and atraumatic.     Nose: Nose normal.     Mouth/Throat:     Mouth: Mucous membranes are moist.  Eyes:     Extraocular Movements: Extraocular movements intact.     Conjunctiva/sclera: Conjunctivae normal.     Pupils: Pupils are equal, round, and reactive to light.  Pulmonary:     Effort: Pulmonary effort is normal.  Abdominal:     General: Abdomen is flat.  Genitourinary:    Penis: Normal.      Comments: Exposed testicle are viable, posterior scrotum absent, healthy granulation tissue was seen in the wound bed.   Scant bleeding when dressings removed.  No purulent drainage on the dressing or from the wound.  No erythema, crepitus noted to the remaining scrotum. Musculoskeletal:        General: Normal range of motion.     Cervical back: Neck supple.  Skin:    General: Skin is warm.  Neurological:     General: No focal deficit present.     Mental Status: He is alert and oriented to person, place, and time.  Psychiatric:        Mood and Affect: Mood normal.        Behavior: Behavior normal.        Thought Content:  Thought content  normal.        Judgment: Judgment normal.     Lab Results:  No results for input(s): "WBC", "HGB", "HCT", "PLT" in the last 72 hours. BMET Recent Labs    12/31/22 0859 01/01/23 0424  NA 127* 128*  K  --  3.2*  CL  --  97*  CO2  --  26  GLUCOSE  --  215*  BUN  --  9  CREATININE  --  0.75  CALCIUM  --  8.2*   PT/INR No results for input(s): "LABPROT", "INR" in the last 72 hours. ABG No results for input(s): "PHART", "HCO3" in the last 72 hours.  Invalid input(s): "PCO2", "PO2"  Studies/Results: No results found.   Assessment: 42 year old uncontrolled diabetic male on [place] who was admitted with Fournier's gangrene and severe hyponatremia who underwent perineal debridement with Dr. Alvester Morin 3 days ago.  He continues to clinically improve on empiric antibiotics.  Wound cultures are still pending.  His wound dressings were exchanged at bedside.  The dressings from yesterday did not have any purulent or infected material on them.  The wound bed was found to have good healthy granulation tissue and his testicles are viable bilaterally.   No need for further debridement.  His dressings were replaced with moist 4 x 4 gauze pads and then covered with ABD pad and we replaced his mesh underwear.  Nursing staff assisted with the dressing change this morning and feel comfortable with dressing exchanges.    We will reach out to plastics at [facility] to discuss possible transfer once he is medically stabilized from an infectious standpoint.   Plan: -continue daily wet-to-dry dressing changes, urology to perform  -continue antibiotics per ID and follow cultures  -Urology to continue discharge planning; anticipate at least outpatient referral to tertiary care center      LOS: 3 days    [facility] 01/01/2023

## 2023-01-02 ENCOUNTER — Other Ambulatory Visit: Payer: Self-pay

## 2023-01-02 DIAGNOSIS — N493 Fournier gangrene: Secondary | ICD-10-CM | POA: Diagnosis not present

## 2023-01-02 LAB — GLUCOSE, CAPILLARY
Glucose-Capillary: 126 mg/dL — ABNORMAL HIGH (ref 70–99)
Glucose-Capillary: 159 mg/dL — ABNORMAL HIGH (ref 70–99)
Glucose-Capillary: 159 mg/dL — ABNORMAL HIGH (ref 70–99)
Glucose-Capillary: 161 mg/dL — ABNORMAL HIGH (ref 70–99)
Glucose-Capillary: 163 mg/dL — ABNORMAL HIGH (ref 70–99)
Glucose-Capillary: 213 mg/dL — ABNORMAL HIGH (ref 70–99)

## 2023-01-02 LAB — BASIC METABOLIC PANEL
Anion gap: 8 (ref 5–15)
BUN: <5 mg/dL — ABNORMAL LOW (ref 6–20)
CO2: 23 mmol/L (ref 22–32)
Calcium: 8.5 mg/dL — ABNORMAL LOW (ref 8.9–10.3)
Chloride: 99 mmol/L (ref 98–111)
Creatinine, Ser: 0.54 mg/dL — ABNORMAL LOW (ref 0.61–1.24)
GFR, Estimated: >60 mL/min (ref >60)
Glucose, Bld: 214 mg/dL — ABNORMAL HIGH (ref 70–99)
Potassium: 3.5 mmol/L (ref 3.5–5.1)
Sodium: 130 mmol/L — ABNORMAL LOW (ref 135–145)

## 2023-01-02 NOTE — Progress Notes (Addendum)
Date of Admission:  12/28/2022     ID: Steve [name] is a 42 y.o. male  Principal Problem:   Fournier's gangrene of scrotum Active Problems:   Type 2 diabetes mellitus with hyperglycemia, without long-term current use of insulin (HCC)   Hyponatremia   Dyslipidemia   GERD without esophagitis   Essential hypertension   Sepsis due to cellulitis (HCC)    Subjective: Feeling better  Medications:   enoxaparin (LOVENOX) injection  50 mg Subcutaneous Q24H   fluconazole  400 mg Oral Daily   insulin aspart  0-9 Units Subcutaneous Q4H   insulin glargine-yfgn  25 Units Subcutaneous Daily   polyethylene glycol  17 g Oral Daily   rosuvastatin  5 mg Oral Daily    Objective: Vital signs in last 24 hours: Patient Vitals for the past 24 hrs:  BP Temp Temp src Pulse Resp SpO2  01/02/23 0749 (!) 158/90 97.6 F (36.4 C) Oral (!) 57 18 99 %  01/02/23 0348 (!) 158/77 98.2 F (36.8 C) Oral 60 20 99 %  01/01/23 1900 (!) 156/90 98.8 F (37.1 C) Oral 64 18 99 %  01/01/23 1638 (!) 154/81 97.9 F (36.6 C) Oral 66 18 99 %      PHYSICAL EXAM:  General: Alert, cooperative, no distress, appears stated age.  Lungs: Clear to auscultation bilaterally. No Wheezing or Rhonchi. No rales. Heart: Regular rate and rhythm, no murmur, rub or gallop. Abdomen: Soft, non-tender,not distended. Bowel sounds normal. No masses Extremities: atraumatic, no cyanosis. No edema. No clubbing Skin: No rashes or lesions. Or bruising Lymph: Cervical, supraclavicular normal. Neurologic: Grossly non-focal Scrotal wound reviewed        Lab Results    Latest Ref Rng & Units 01/02/2023    4:44 AM 12/29/2022    6:05 AM 12/28/2022    9:18 PM  CBC  WBC 4.0 - 10.5 K/uL 6.0  4.5  4.2   Hemoglobin 13.0 - 17.0 g/dL 16.1  09.6  04.5   Hematocrit 39.0 - 52.0 % 31.4  34.7  38.2   Platelets 150 - 400 K/uL 237  110  116        Latest Ref Rng & Units 01/02/2023   10:42 AM 01/01/2023    4:24 AM 12/31/2022    8:59 AM   CMP  Glucose 70 - 99 mg/dL 409  811    BUN 6 - 20 mg/dL <5  9    Creatinine 9.14 - 1.24 mg/dL 7.82  9.56    Sodium 213 - 145 mmol/L 130  128  127   Potassium 3.5 - 5.1 mmol/L 3.5  3.2    Chloride 98 - 111 mmol/L 99  97    CO2 22 - 32 mmol/L 23  26    Calcium 8.9 - 10.3 mg/dL 8.5  8.2        Microbiology: Scrotum wound culture ecoli-  Yeast Not finalized      Assessment/Plan: Fournier's gangrene of the scrotum Extensive debridement has been done Patient is currently on Zosyn and linezolid -  So far ecoli in culture resistant to oral antibiotics Yeast in wound- started fluconazole can DC linezolid Pt needs to be referred to plastic surgeon - appt need to be given to him before he is discharged Also he/family  will have to be taught how to dress the wound-pt is very concerned about the dressing changes at home and is rightly worried that it cannot be done by any  non medical person  Diabetes mellitus poorly controlled On insulin currently  So will need IV antibiotics on discharge- HE will do ceftriaxone 2 grams IV every day+ flagyl 500mg  PO BID and Fluconazole 400mg  every day for 2 weeks  Or it can be ertapenem 1 gram IV every day + PO fluconazole ( will check with home infusion nurse regarding deductible/insurance charges) Discussed the management with the patient in detail Discussed with care team

## 2023-01-02 NOTE — Care Management Important Message (Signed)
Important Message  Patient Details  Name: Steve [name] MRN: [id number] Date of Birth: 1980/12/02   Medicare Important Message Given:  Yes     Olegario Messier A Baylen Buckner 01/02/2023, 12:05 PM

## 2023-01-02 NOTE — Progress Notes (Addendum)
Progress Note   Patient: Steve [name] XBJ:[id number] DOB: 08/24/1980 DOA: 12/28/2022     4 DOS: the patient was seen and examined on 01/02/2023   Subjective:  Patient seen in his bedside this morning Denies nausea vomiting abdominal pain Case discussed with urology and infectious disease  Brief hospital course:   Steve [name] is a 42 y.o. Caucasian male with medical history significant for type 2 diabetes mellitus, GERD, essential hypertension and gastritis, presented to the emergency room with acute onset of worsening abscess on his genitals for which he has been seen in the ER couple of days ago and placed on Septra for so far without improvement.  It has become more uncomfortable and more swollen.  He stated that he pushed the air pin into it to alleviate the swelling and increased purulent drainage so that he will feel better.  He denies any fever or chills.  No nausea or vomiting or abdominal pain.  No chest pain or palpitations.  No cough or wheezing or dyspnea.  No dysuria, oliguria or hematuria, urgency or frequency or flank pain.  Blood glucose levels have been elevated.  He had a pelvic CT scan revealing suspicious foreign years gangrene.  Use hyperglycemic as well is hyponatremic.  He was taken to the OR by Dr. Alvester Morin and underwent debridement.   ED Course: When he came to the ER, was's were within normal and later BP was 146/79, heart rate 93 and respiratory rate 22.  Labs revealed a sodium of 118 around was 117 and a chloride of 84 that later on was 85 with blood glucose of 318 and later 310, calcium of 8.1 and later 8, alk phos of 233 and later 232 with albumin of 2.8 and later the same, AST 97 and later 94, ALT 124 and later 121 and total bili of 3 and later 2.9.  Lactic acid was 1.8 and CBC showed microcytosis and was otherwise unremarkable. EKG as reviewed by me : None yet. Imaging: Pelvic CT with contrast revealed the following: Constellation of findings are concerning for  necrotizing infection the scrotum. No organized abscess.   The patient was given 4 mg of IV morphine sulfate and 4 mg of IV Zofran as well as IV Zosyn and vancomycin.  He underwent debridement of his Fournier's gangrene as mentioned above by Dr. Alvester Morin.  We were asked to consult and admit the patient postoperatively.  He will be admitted to a stepdown unit bed from the PACU for further evaluation and management.       Assessment and Plan: Sepsis POA  Fournier's gangrene of scrotum - s/p emergent post debridement of his fournier's gangrene. - Continue current antibiotics linezolid + Zosyn Case discussed with infectious disease Patient likely will need IV antibiotics with PICC line placement Continue to follow-up on culture results Continue current pain management Discussed with urologist today planning on wound dressing change tomorrow      Severe Hyponatremia Hyperglycemia --117--118--121--122--127--125 -- 128 -- Most likely secondary to hypoglycemia.  Improved but not back to normal values - cont hydration with IV normal saline for now and obtain hyponatremia workup. --consider Nephrology input if sodium does not improve --mentation stable    Type 2 diabetes mellitus with hyperglycemia, without long-term current use of insulin (HCC), uncontrolled - Hemoglobin A1c is 13.5 --Continue Semglee 25 units and sliding scale coverage -- Continue sliding scale insulin therapy Monitor glucose closely   Dyslipidemia - Continue current statin therapy   Essential hypertension - apparently  diet managed and his BP has been fairly controlled.   GERD without esophagitis - Continue PPI     Constipation Continue MiraLAX     Hypokalemia Supplement potassium       FOLEY+ On 12/30/2022   On 12/31/22    EXTREMITIES: No  edema b/l.    NEUROLOGIC: nonfocal  patient is alert and awake       Data Reviewed: I have reviewed patient's lab results including culture urology documentation,  infectious disease documentation as well as vitals   Family Communication: Plan of care discussed with patient   Disposition: Status is: Inpatient Remains inpatient appropriate because: On IV antibiotics and receiving wound care  Planned Discharge Destination: Home     Physical examination:  GENERAL:  42 y.o.-year-old patient with no acute distress.  LUNGS: Normal breath sounds bilaterally, no wheezing CARDIOVASCULAR: S1, S2 normal. No murmur   ABDOMEN: Soft, nontender, nondistended. Bowel sounds present. Packing + in the perineal area    Vitals:   01/01/23 1900 01/02/23 0348 01/02/23 0749 01/02/23 1542  BP: (!) 156/90 (!) 158/77 (!) 158/90 (!) 163/80  Pulse: 64 60 (!) 57 61  Resp: 18 20 18 18   Temp: 98.8 F (37.1 C) 98.2 F (36.8 C) 97.6 F (36.4 C) 98.3 F (36.8 C)  TempSrc: Oral Oral Oral Oral  SpO2: 99% 99% 99% 99%  Weight:      Height:         Author: [name], MD 01/02/2023 5:17 PM  For on call review [email or web address].

## 2023-01-02 NOTE — Consult Note (Signed)
WOC Nurse Consult Note: Reason for Consult: scrotal wound This wound is a surgical wound that is being managed by urology. Verified with urologist orders for saline moist to moist gauze dressings, updated orders  Wound type:surgical  Pressure Injury POA: NA Dressing procedure/placement/frequency: Saline moist gauze dressings BID, ABD pads, secure with mesh/stretch underwear.   Re consult if needed, will not follow at this time. Thanks  Lenell Mcconnell [employer], RN,CWOCN, CNS, CWON-AP [phone])

## 2023-01-02 NOTE — Progress Notes (Signed)
Urology Inpatient Progress Note  Subjective: No acute events overnight.  He remains afebrile, VSS. WBC count 6.0.  Sodium 130.  Wound cultures growing rare MDR E. Coli and rare Candida albicans, on antibiotics as below per ID. He continues to spontaneously void. He clarifies today that he was not taking [place] leading up to his current admission.  He is curious about dispo planning.  Anti-infectives: Anti-infectives (From admission, onward)    Start     Dose/Rate Route Frequency Ordered Stop   01/01/23 1815  fluconazole (DIFLUCAN) tablet 400 mg        400 mg Oral Daily 01/01/23 1715     12/29/22 1600  vancomycin (VANCOREADY) IVPB 1500 mg/300 mL  Status:  Discontinued        1,500 mg 150 mL/hr over 120 Minutes Intravenous Every 12 hours 12/29/22 1014 12/29/22 1329   12/29/22 1430  linezolid (ZYVOX) IVPB 600 mg        600 mg 300 mL/hr over 60 Minutes Intravenous Every 12 hours 12/29/22 1329     12/29/22 0600  piperacillin-tazobactam (ZOSYN) IVPB 3.375 g  Status:  Discontinued        3.375 g 100 mL/hr over 30 Minutes Intravenous Every 6 hours 12/29/22 0254 12/29/22 0257   12/29/22 0600  piperacillin-tazobactam (ZOSYN) IVPB 3.375 g        3.375 g 12.5 mL/hr over 240 Minutes Intravenous Every 8 hours 12/29/22 0257     12/29/22 0400  vancomycin (VANCOREADY) IVPB 1250 mg/250 mL  Status:  Discontinued        1,250 mg 166.7 mL/hr over 90 Minutes Intravenous Every 8 hours 12/29/22 0304 12/29/22 1014   12/29/22 0300  vancomycin (VANCOCIN) IVPB 1000 mg/200 mL premix  Status:  Discontinued        1,000 mg 200 mL/hr over 60 Minutes Intravenous  Once 12/29/22 0254 12/29/22 0304   12/28/22 2215  piperacillin-tazobactam (ZOSYN) IVPB 3.375 g        3.375 g 100 mL/hr over 30 Minutes Intravenous  Once 12/28/22 2204 12/28/22 2343   12/28/22 2215  vancomycin (VANCOCIN) IVPB 1000 mg/200 mL premix        1,000 mg 200 mL/hr over 60 Minutes Intravenous  Once 12/28/22 2204 12/29/22 0257       Current  Facility-Administered Medications  Medication Dose Route Frequency Provider Last Rate Last Admin   acetaminophen (TYLENOL) tablet 650 mg  650 mg Oral Q6H PRN [name], [name], MD   650 mg at 12/29/22 2045   Or   acetaminophen (TYLENOL) suppository 650 mg  650 mg Rectal Q6H PRN [name], [name], MD       enoxaparin (LOVENOX) injection 50 mg  50 mg Subcutaneous Q24H [name], [name], MD   50 mg at 01/01/23 2140   fentaNYL (SUBLIMAZE) injection 25 mcg  25 mcg Intravenous Q4H PRN [name], [name], MD   25 mcg at 01/01/23 0759   fluconazole (DIFLUCAN) tablet 400 mg  400 mg Oral Daily [name], MD   400 mg at 01/02/23 0857   HYDROcodone-acetaminophen (NORCO/VICODIN) 5-325 MG per tablet 1-2 tablet  1-2 tablet Oral Q6H PRN [name], MD   2 tablet at 01/01/23 1125   insulin aspart (novoLOG) injection 0-9 Units  0-9 Units Subcutaneous Q4H [name], [name]   3 Units at 01/02/23 1155   insulin glargine-yfgn (SEMGLEE) injection 25 Units  25 Units Subcutaneous Daily [name], MD   25 Units at 01/02/23 0856   ketorolac (TORADOL) 15  MG/ML injection 15 mg  15 mg Intravenous Q6H PRN [name], [name], MD   15 mg at 01/02/23 1155   linezolid (ZYVOX) IVPB 600 mg  600 mg Intravenous Q12H Jaynie Bream, [name] 300 mL/hr at 01/02/23 0905 600 mg at 01/02/23 0905   magnesium hydroxide (MILK OF MAGNESIA) suspension 30 mL  30 mL Oral Daily PRN [name], [name], MD       ondansetron [facility]) tablet 4 mg  4 mg Oral Q6H PRN [name], [name], MD       Or   ondansetron [facility]) injection 4 mg  4 mg Intravenous Q6H PRN [name], [name], MD       Oral care mouth rinse  15 mL Mouth Rinse PRN [name], [name], MD       piperacillin-tazobactam (ZOSYN) IVPB 3.375 g  3.375 g Intravenous Q8H Otelia Sergeant, [name] 12.5 mL/hr at 01/02/23 0544 3.375 g at 01/02/23 0544   polyethylene glycol (MIRALAX / GLYCOLAX) packet 17 g  17 g Oral Daily [name], MD   17 g at 01/02/23 0856   rosuvastatin (CRESTOR) tablet 5 mg  5 mg Oral Daily [name], MD   5  mg at 01/02/23 0857   traZODone (DESYREL) tablet 25 mg  25 mg Oral QHS PRN [name], [name], MD       Objective: Vital signs in last 24 hours: Temp:  [97.6 F (36.4 C)-98.8 F (37.1 C)] 97.6 F (36.4 C) (08/01 0749) Pulse Rate:  [57-66] 57 (08/01 0749) Resp:  [18-20] 18 (08/01 0749) BP: (154-158)/(77-90) 158/90 (08/01 0749) SpO2:  [99 %] 99 % (08/01 0749)  Intake/Output from previous day: 07/31 0701 - 08/01 0700 In: 0  Out: 2200 [Urine:2200] Intake/Output this shift: Total I/O In: -  Out: 500 [Urine:500]  Physical Exam Vitals and nursing note reviewed.  Constitutional:      General: He is not in acute distress.    Appearance: He is not ill-appearing, toxic-appearing or diaphoretic.  HENT:     Head: Normocephalic and atraumatic.  Pulmonary:     Effort: Pulmonary effort is normal. No respiratory distress.  Genitourinary:    Comments: Posterior hemiscrotum surgically absent.  Bilateral testes are viable appearing.  Perineal wound appears healthy with granulation tissue throughout the wound bed.  Oozing bleeding with removal of wet-to-dry gauze.  New photos taken, see media tab. Skin:    General: Skin is warm and dry.  Neurological:     Mental Status: He is alert and oriented to person, place, and time.  Psychiatric:        Mood and Affect: Mood normal.        Behavior: Behavior normal.    Lab Results:  Recent Labs    01/02/23 0444  WBC 6.0  HGB 10.6*  HCT 31.4*  PLT 237   BMET Recent Labs    01/01/23 0424 01/02/23 1042  NA 128* 130*  K 3.2* 3.5  CL 97* 99  CO2 26 23  GLUCOSE 215* 214*  BUN 9 <5*  CREATININE 0.75 0.54*  CALCIUM 8.2* 8.5*   Assessment & Plan: 42 year old diabetic male admitted with Fournier's gangrene and severe hyponatremia, now POD4 from perineal debridement with Dr. Alvester Morin.  He was premedicated today prior to dressing exchange.  Michiel Cowboy and I saw him at the bedside to perform daily wet-to-dry dressing changes with sterile 4 x 4  gauze pads and sterile saline.  He continues to tolerate these reasonably well, though given the extent  of his wound will continue to recommend premedication for them.  Wound photos updated today, see media tab.  No new significant findings today.  We continue to work on dispo planning for him, options include discharge with outpatient follow-up with plastics team versus patient transfer for closure.  Recommendations: -Daily wet-to-dry dressing changes, urology to perform, premedication needed -Continue anti-infectives per ID -Urology to continue dispo planning  Carman Ching, PA-C 01/02/2023

## 2023-01-02 NOTE — Inpatient Diabetes Management (Addendum)
Inpatient Diabetes Program Recommendations  AACE/ADA: New Consensus Statement on Inpatient Glycemic Control (2015)  Target Ranges:  Prepandial:   less than 140 mg/dL      Peak postprandial:   less than 180 mg/dL (1-2 hours)      Critically ill patients:  140 - 180 mg/dL    Latest Reference Range & Units 12/29/22 06:05  Hemoglobin A1C 4.8 - 5.6 % 13.5 (H)  341 mg/dl  (H): Data is abnormally high  Latest Reference Range & Units 01/01/23 00:39 01/01/23 03:17 01/01/23 07:39 01/01/23 12:44 01/01/23 16:35 01/01/23 19:25  Glucose-Capillary 70 - 99 mg/dL 604 (H)  5 units Novolog 232 (H)  3 units Novolog  204 (H)  3 units Novolog 244 (H)  3 units Novolog  25 units Semglee 296 (H)  5 units Novolog 221 (H)  3 units Novolog  (H): Data is abnormally high  Latest Reference Range & Units 01/01/23 23:46 01/02/23 03:45 01/02/23 07:49  Glucose-Capillary 70 - 99 mg/dL 540 (H)  2 units Novolog  159 (H)  2 units Novolog  126 (H)  1 unit Novolog  25 units Semglee  (H): Data is abnormally high  Admit with:  Sepsis POA Fournier's gangrene of scrotum  History: DM2  Home DM Meds: Farxiga 10 mg Daily       Mounjaro 2.5 mg Qweek??  Current Orders: Semglee 25 units Daily      Novolog Sensitive Correction Scale/ SSI (0-9 units) Q4 hours    MD- Noted Farxiga listed as started 11/18/22 so will need to be discontinued @ discharge home given admission with scrotal gangrene  If afternoon CBGs remain elevated today, please consider starting Novolog Meal Coverage: Novolog 3 units TID with meals HOLD if pt NPO HOLD if pt eats <50% meals  May also consider changing the frequency of the Novolog SSI to TID AC + HS    Addendum 11:45am--Met w/ pt at bedside.  He told me he feels comfortable using insulin pens and appreciated the review with the Diabetes RN yest.  Gave pt 3 samples of the Freestyle Libre 3 CGM sensors.  App has already been set up on phone per DM RN yesterday and per pt.  Did  not place sensor on pt yet as he does not know when he will be discharged yet.  Asked pt to go the [facility] [place] [facility] website and watch the video on application/start of the sensor.       --Will follow patient during hospitalization--  Ambrose Finland RN, MSN, CDCES Diabetes Coordinator Inpatient Glycemic Control Team Team Pager: [phone] (8a-5p)

## 2023-01-03 ENCOUNTER — Other Ambulatory Visit: Payer: Self-pay | Admitting: Physician Assistant

## 2023-01-03 ENCOUNTER — Other Ambulatory Visit (HOSPITAL_COMMUNITY): Payer: Self-pay

## 2023-01-03 DIAGNOSIS — E1165 Type 2 diabetes mellitus with hyperglycemia: Secondary | ICD-10-CM | POA: Diagnosis not present

## 2023-01-03 DIAGNOSIS — N493 Fournier gangrene: Secondary | ICD-10-CM

## 2023-01-03 DIAGNOSIS — Z794 Long term (current) use of insulin: Secondary | ICD-10-CM | POA: Diagnosis not present

## 2023-01-03 LAB — GLUCOSE, CAPILLARY
Glucose-Capillary: 131 mg/dL — ABNORMAL HIGH (ref 70–99)
Glucose-Capillary: 134 mg/dL — ABNORMAL HIGH (ref 70–99)
Glucose-Capillary: 143 mg/dL — ABNORMAL HIGH (ref 70–99)
Glucose-Capillary: 168 mg/dL — ABNORMAL HIGH (ref 70–99)
Glucose-Capillary: 170 mg/dL — ABNORMAL HIGH (ref 70–99)
Glucose-Capillary: 173 mg/dL — ABNORMAL HIGH (ref 70–99)

## 2023-01-03 MED ORDER — POTASSIUM CHLORIDE CRYS ER 20 MEQ PO TBCR
40.0000 meq | EXTENDED_RELEASE_TABLET | ORAL | Status: AC
  Administered 2023-01-03 (×2): 40 meq via ORAL
  Filled 2023-01-03 (×2): qty 2

## 2023-01-03 MED ORDER — CHLORHEXIDINE GLUCONATE CLOTH 2 % EX PADS
6.0000 | MEDICATED_PAD | Freq: Every day | CUTANEOUS | Status: DC
  Administered 2023-01-03 – 2023-01-06 (×4): 6 via TOPICAL

## 2023-01-03 MED ORDER — AMLODIPINE BESYLATE 5 MG PO TABS
5.0000 mg | ORAL_TABLET | Freq: Every day | ORAL | Status: DC
  Administered 2023-01-03 – 2023-01-06 (×4): 5 mg via ORAL
  Filled 2023-01-03 (×4): qty 1

## 2023-01-03 MED ORDER — METRONIDAZOLE 500 MG PO TABS
500.0000 mg | ORAL_TABLET | Freq: Two times a day (BID) | ORAL | Status: DC
  Administered 2023-01-03 – 2023-01-06 (×7): 500 mg via ORAL
  Filled 2023-01-03 (×8): qty 1

## 2023-01-03 MED ORDER — SODIUM CHLORIDE 0.9% FLUSH
10.0000 mL | Freq: Two times a day (BID) | INTRAVENOUS | Status: DC
  Administered 2023-01-03 – 2023-01-06 (×7): 10 mL

## 2023-01-03 MED ORDER — SODIUM CHLORIDE 0.9 % IV SOLN
2.0000 g | INTRAVENOUS | Status: DC
  Administered 2023-01-03 – 2023-01-05 (×3): 2 g via INTRAVENOUS
  Filled 2023-01-03 (×4): qty 20

## 2023-01-03 MED ORDER — SODIUM CHLORIDE 0.9% FLUSH: 10.0000 mL | INTRAVENOUS | Status: DC | PRN

## 2023-01-03 NOTE — Progress Notes (Signed)
Peripherally Inserted Central Catheter Placement  The IV Nurse has discussed with the patient and/or persons authorized to consent for the patient, the purpose of this procedure and the potential benefits and risks involved with this procedure.  The benefits include less needle sticks, lab draws from the catheter, and the patient may be discharged home with the catheter. Risks include, but not limited to, infection, bleeding, blood clot (thrombus formation), and puncture of an artery; nerve damage and irregular heartbeat and possibility to perform a PICC exchange if needed/ordered by physician.  Alternatives to this procedure were also discussed.  Bard Power PICC patient education guide, fact sheet on infection prevention and patient information card has been provided to patient /or left at bedside.    PICC Placement Documentation  PICC Single Lumen 01/03/23 Right Brachial 39 cm 0 cm (Active)  Indication for Insertion or Continuance of Line Prolonged intravenous therapies 01/03/23 0850  Exposed Catheter (cm) 0 cm 01/03/23 0850  Site Assessment Clean, Dry, Intact 01/03/23 0850  Line Status Flushed;Blood return noted;Saline locked 01/03/23 0850  Dressing Type Transparent 01/03/23 0850  Dressing Status Antimicrobial disc in place 01/03/23 0850  Safety Lock Not Applicable 01/03/23 0850  Line Care Connections checked and tightened 01/03/23 0850  Line Adjustment (NICU/IV Team Only) No 01/03/23 0850  Dressing Intervention New dressing 01/03/23 0850  Dressing Change Due 01/10/23 01/03/23 0850       Audrie Gallus 01/03/2023, 9:18 AM

## 2023-01-03 NOTE — Progress Notes (Signed)
Offered to give pt bath, pt refused and stated with take bath during the day shift. Also, pt had not have BM since 12/26/22, offered PRN for constipation, pt refused.

## 2023-01-03 NOTE — Discharge Instructions (Addendum)
How to Perform Once Daily Wet-to-Dry Dressing Changes:  Materials needed:  -Pain medication as needed -Sterile 4x4 inch gauze pads -Sterile saline -ABD pad -Mesh or clean brief-style underwear  Process: If needed, premedicate with oral pain medication at least 30 minutes prior to changing your dressing. Remove yesterday's wound dressing. You may moisten the gauze to ease removal if needed (e.g. by getting them wet in the shower). Make sure you remove ALL of the packing material and can see the base of the entire wound before proceeding to step 2. Moisten the sterile gauze pads with sterile saline, then wring/squeeze them out--they should be damp, not saturated or dripping. Insert the moistened gauze pads into the wound bed to fill the wound cavity and keep it open. Make sure to lay some of this gauze between the sides of your testicles and the adjacent scrotal skin--you have a great flap that will allow this on the right side, but may not be able to do this on the left. That is okay. Once the wound cavity is packed, lay one last moistened gauze pad across your testicles to keep them moist. Cover the wound with a dry ABD pad to keep everything in place. Replace your underwear to secure the bandage.  Please call [facility] with any questions or concerns, [phone].

## 2023-01-03 NOTE — Treatment Plan (Signed)
Diagnosis: Fourniers gangrene Baseline Creatinine <1    No Known Allergies  OPAT Orders Ceftriaxone 2 grams IV every 24 hrs  + Fluconazole 400mg  once a day + Flagyl 500mg  PO BID  Duration: 2 weeks End Date: 01/17/23  [facility] Care Per Protocol:  Labs weekly while on IV antibiotics: _X_ CBC with differential  _X_ CMP   _X_ Please pull PIC at completion of IV antibiotics   Fax weekly lab results  promptly to [phone]  Clinic Follow Up Appt:01/16/23 at 10.30AM   Call [phone] with any questions

## 2023-01-03 NOTE — Consult Note (Signed)
PHARMACY CONSULT NOTE FOR:  OUTPATIENT  PARENTERAL ANTIBIOTIC THERAPY (OPAT)  Indication: Fourniers gangrene  Regimen:  Ceftriaxone 2 grams IV q24h Fluconazole 400 mg PO daily Metronidazole 500 mg PO BID  End date: 01/17/23  IV antibiotic discharge orders are pended. To discharging provider:  please sign these orders via discharge navigator,  Select New Orders & click on the button choice - Manage This Unsigned Work.     Thank you for allowing pharmacy to be a part of this patient's care.  Tressie Ellis 01/03/2023, 10:31 AM

## 2023-01-03 NOTE — TOC Progression Note (Addendum)
Transition of Care [facility]) - Progression Note    Patient Details  Name: Steve [name] MRN: [id number] Date of Birth: Oct 26, 1980  Transition of Care [facility]) CM/SW Contact  Truddie Hidden, RN Phone Number: 01/03/2023, 10:51 AM  Clinical Narrative:    Attempt to reach Jeri Modena from [employer]. No answer. Message sent regarding patients discharge plan.   Spoke with Elson Clan from [facility] to confirm acceptance. Jerilynn Som stated patient's are not being accepted in the [facility] are due to staffing. He will have Ladona Ridgel contact this [name].   11:38am Retrieved a call from [employer]. Ladona Ridgel stated she works for [employer]. She conferred that patient can not be accepted.   1:00pm Referral sent to [facility] from [place]. Pam from [employer] informed HH still being secured.         Expected Discharge Plan and Services                                     [facility] Agency: [facility] Date [facility] Agency Contacted: 12/30/22 Time HH Agency Contacted: 1147 Representative spoke with at [facility] Agency: Jeralene Peters of [facility]   Social Determinants of Health (SDOH) Interventions SDOH Screenings   Food Insecurity: No Food Insecurity (12/29/2022)  Housing: Low Risk  (12/29/2022)  Recent Concern: Housing - Medium Risk (12/03/2022)  Transportation Needs: No Transportation Needs (12/29/2022)  Utilities: At Risk (12/29/2022)  Alcohol Screen: Low Risk  (12/02/2022)  Depression (PHQ2-9): Low Risk  (12/02/2022)  Recent Concern: Depression (PHQ2-9) - Medium Risk (11/18/2022)  Financial Resource Strain: Medium Risk (12/02/2022)  Physical Activity: Sufficiently Active (12/02/2022)  Social Connections: Socially Isolated (12/02/2022)  Stress: No Stress Concern Present (12/02/2022)  Tobacco Use: High Risk (12/29/2022)    Readmission Risk Interventions     No data to display

## 2023-01-03 NOTE — Progress Notes (Signed)
Date of Admission:  12/28/2022     ID: Steve [name] is a 42 y.o. male  Principal Problem:   Fournier's gangrene of scrotum Active Problems:   Type 2 diabetes mellitus with hyperglycemia, without long-term current use of insulin (HCC)   Hyponatremia   Dyslipidemia   GERD without esophagitis   Essential hypertension   Sepsis due to cellulitis [facility])    Subjective: Says he is okay Was taught to do IV antibiotics at home and he will do it himself  he says  Medications:   Chlorhexidine Gluconate Cloth  6 each Topical Daily   enoxaparin (LOVENOX) injection  50 mg Subcutaneous Q24H   fluconazole  400 mg Oral Daily   insulin aspart  0-9 Units Subcutaneous Q4H   insulin glargine-yfgn  25 Units Subcutaneous Daily   metroNIDAZOLE  500 mg Oral Q12H   polyethylene glycol  17 g Oral Daily   potassium chloride  40 mEq Oral Q4H   rosuvastatin  5 mg Oral Daily   sodium chloride flush  10-40 mL Intracatheter Q12H    Objective: Vital signs in last 24 hours: Patient Vitals for the past 24 hrs:  BP Temp Temp src Pulse Resp SpO2  01/03/23 0430 (!) 158/89 98.3 F (36.8 C) Oral 63 -- 99 %  01/02/23 2132 (!) 161/73 -- -- 65 -- --  01/02/23 2003 (!) 177/87 98.6 F (37 C) Oral 65 19 98 %  01/02/23 1542 (!) 163/80 98.3 F (36.8 C) Oral 61 18 99 %      PHYSICAL EXAM:  General: Alert, cooperative, no distress, appears stated age.  Lungs: Clear to auscultation bilaterally. No Wheezing or Rhonchi. No rales. Heart: Regular rate and rhythm, no murmur, rub or gallop. Abdomen: Soft, non-tender,not distended. Bowel sounds normal. No masses Extremities: atraumatic, no cyanosis. No edema. No clubbing Skin: No rashes or lesions. Or bruising Lymph: Cervical, supraclavicular normal. Neurologic: Grossly non-focal Scrotum dressing not removed Rt PICC     Lab Results    Latest Ref Rng & Units 01/03/2023    4:43 AM 01/02/2023    4:44 AM 12/29/2022    6:05 AM  CBC  WBC 4.0 - 10.5 K/uL 6.8  6.0   4.5   Hemoglobin 13.0 - 17.0 g/dL 16.1  09.6  04.5   Hematocrit 39.0 - 52.0 % 30.2  31.4  34.7   Platelets 150 - 400 K/uL 305  237  110        Latest Ref Rng & Units 01/03/2023    4:43 AM 01/02/2023   10:42 AM 01/01/2023    4:24 AM  CMP  Glucose 70 - 99 mg/dL 409  811  914   BUN 6 - 20 mg/dL 5  <5  9   Creatinine 7.82 - 1.24 mg/dL 9.56  2.13  0.86   Sodium 135 - 145 mmol/L 133  130  128   Potassium 3.5 - 5.1 mmol/L 3.4  3.5  3.2   Chloride 98 - 111 mmol/L 100  99  97   CO2 22 - 32 mmol/L 27  23  26    Calcium 8.9 - 10.3 mg/dL 8.8  8.5  8.2       Microbiology: Scrotum wound culture ecoli-  Candida albicans Not finalized      Assessment/Plan: Fournier's gangrene of the scrotum Extensive debridement has been done Patient is currently on Zosyn will change to ceftriaxone + flagyl-  Candida albicans in  wound- on  fluconazole  Pt needs to  be referred to plastic surgeon     Diabetes mellitus poorly controlled On insulin currently  So will need IV antibiotics on discharge- HE will do ceftriaxone 2 grams IV every day+ flagyl 500mg  PO BID and Fluconazole 400mg  every day for 2 weeks  Discussed the management with the patient in detail Discussed with care team OPAT orders placed Will follow him as OP

## 2023-01-03 NOTE — Progress Notes (Addendum)
Progress Note   Patient: Steve [name] OVF:[id number] DOB: 09-26-1980 DOA: 12/28/2022     5 DOS: the patient was seen and examined on 01/03/2023   Subjective:  Patient seen in his bedside this morning Denies nausea vomiting abdominal pain Case discussed with urology and infectious disease   Brief hospital course:   Steve [name] is a 42 y.o. Caucasian male with medical history significant for type 2 diabetes mellitus, GERD, essential hypertension and gastritis, presented to the emergency room with acute onset of worsening abscess on his genitals for which he has been seen in the ER couple of days ago and placed on Septra for so far without improvement.  It has become more uncomfortable and more swollen.  He stated that he pushed the air pin into it to alleviate the swelling and increased purulent drainage so that he will feel better.  He denies any fever or chills.  No nausea or vomiting or abdominal pain.  No chest pain or palpitations.  No cough or wheezing or dyspnea.  No dysuria, oliguria or hematuria, urgency or frequency or flank pain.  Blood glucose levels have been elevated.  He had a pelvic CT scan revealing suspicious foreign years gangrene.  Use hyperglycemic as well is hyponatremic.  He was taken to the OR by Dr. Alvester Morin and underwent debridement.   ED Course: When he came to the ER, was's were within normal and later BP was 146/79, heart rate 93 and respiratory rate 22.  Labs revealed a sodium of 118 around was 117 and a chloride of 84 that later on was 85 with blood glucose of 318 and later 310, calcium of 8.1 and later 8, alk phos of 233 and later 232 with albumin of 2.8 and later the same, AST 97 and later 94, ALT 124 and later 121 and total bili of 3 and later 2.9.  Lactic acid was 1.8 and CBC showed microcytosis and was otherwise unremarkable. EKG as reviewed by me : None yet. Imaging: Pelvic CT with contrast revealed the following: Constellation of findings are concerning for  necrotizing infection the scrotum. No organized abscess.   The patient was given 4 mg of IV morphine sulfate and 4 mg of IV Zofran as well as IV Zosyn and vancomycin.  He underwent debridement of his Fournier's gangrene as mentioned above by Dr. Alvester Morin.  We were asked to consult and admit the patient postoperatively.  He will be admitted to a stepdown unit bed from the PACU for further evaluation and management.       Assessment and Plan: Sepsis POA  Fournier's gangrene of scrotum - s/p emergent post debridement of his fournier's gangrene. - Antibiotics change to ceftriaxone, fluconazole and metronidazole Discussed with infectious disease, transition of care manager, urology team Patient received PICC line placement today Infectious disease has recommended the following antibiotics for discharge Ceftriaxone 2 grams IV every 24 hrs  + Fluconazole 400mg  once a day + Flagyl 500mg  PO BID   Duration: 2 weeks End Date: 01/17/23 Continue to follow-up on culture results Continue current pain management     Severe Hyponatremia-improved Hyperglycemia Presented with sodium 117 -- Most likely secondary to hypoglycemia.  Improved but not back to normal values - cont hydration with IV normal saline for now and obtain hyponatremia workup. --consider Nephrology input if sodium does not improve --mentation stable    Type 2 diabetes mellitus with hyperglycemia, without long-term current use of insulin (HCC), uncontrolled - Hemoglobin A1c is 13.5 -- Continue  Semglee 25 units and sliding scale coverage -- Continue sliding scale insulin therapy Monitor glucose closely   Dyslipidemia Continue current statin therapy   Essential hypertension - apparently diet managed and his BP has been fairly controlled. However BP noted to be elevated today I have ordered 5 mg of amlodipine We will continue to monitor blood pressure closely   GERD without esophagitis Continue PPI therapy      Constipation Continue MiraLAX     Hypokalemia-improved Continue to monitor potassium level   EXTREMITIES: No  edema b/l.    NEUROLOGIC: nonfocal  patient is alert and awake       Data Reviewed: I have reviewed patient's lab results including culture urology documentation, infectious disease documentation as well as vitals   Family Communication: Plan of care discussed with patient   Disposition: Status is: Inpatient Remains inpatient appropriate because: On IV antibiotics and receiving wound care  Planned Discharge Destination: Home        Physical examination:  GENERAL:  43 y.o.-year-old patient with no acute distress.  LUNGS: Normal breath sounds bilaterally, no wheezing CARDIOVASCULAR: S1, S2 normal. No murmur   ABDOMEN: Soft, nontender, nondistended. Bowel sounds present. Packing + in the perineal area     Vitals:   01/02/23 2003 01/02/23 2132 01/03/23 0430 01/03/23 1521  BP: (!) 177/87 (!) 161/73 (!) 158/89 (!) 152/83  Pulse: 65 65 63 (!) 59  Resp: 19   18  Temp: 98.6 F (37 C)  98.3 F (36.8 C) 98 F (36.7 C)  TempSrc: Oral  Oral   SpO2: 98%  99% 100%  Weight:      Height:         Author: [name], MD 01/03/2023 5:05 PM  For on call review [email or web address].

## 2023-01-03 NOTE — TOC Benefit Eligibility Note (Signed)
Pharmacy Patient Advocate Encounter  Insurance verification completed.    The patient is insured through [facility] Medicare part D  Ran test claim for fluconazole (Diflucan) 200 mg tablets and the current 14 day co-pay is $0.00.   This test claim was processed through [facility]- copay amounts may vary at other pharmacies due to pharmacy/plan contracts, or as the patient moves through the different stages of their insurance plan.    Roland Earl, CPHT Pharmacy Patient Advocate Specialist [facility] Patient Advocate Team Direct Number: [phone]  Fax: [phone]

## 2023-01-03 NOTE — Plan of Care (Signed)

## 2023-01-03 NOTE — Progress Notes (Signed)
Urology Inpatient Progress Note  Subjective: No acute events overnight.  PICC line placed today due to MDR infection with plans for OPAT per ID. WBC count 6.8.  On antibiotics as below. He continues to void spontaneously. Steve [name] and I spoke with [facility] PA Steve [name]; they are willing to see the patient on an outpatient basis for wound closure. Patient confirms that he will have assistance with dressing changes at home by his child's mother, who is an LPN at our hospital, and his cousin, who is an NT also at our hospital.  Anti-infectives: Anti-infectives (From admission, onward)    Start     Dose/Rate Route Frequency Ordered Stop   01/03/23 1200  cefTRIAXone (ROCEPHIN) 2 g in sodium chloride 0.9 % 100 mL IVPB        2 g 200 mL/hr over 30 Minutes Intravenous Every 24 hours 01/03/23 0946     01/03/23 1045  metroNIDAZOLE (FLAGYL) tablet 500 mg        500 mg Oral Every 12 hours 01/03/23 0946     01/01/23 1815  fluconazole (DIFLUCAN) tablet 400 mg        400 mg Oral Daily 01/01/23 1715     12/29/22 1600  vancomycin (VANCOREADY) IVPB 1500 mg/300 mL  Status:  Discontinued        1,500 mg 150 mL/hr over 120 Minutes Intravenous Every 12 hours 12/29/22 1014 12/29/22 1329   12/29/22 1430  linezolid (ZYVOX) IVPB 600 mg  Status:  Discontinued        600 mg 300 mL/hr over 60 Minutes Intravenous Every 12 hours 12/29/22 1329 01/02/23 1327   12/29/22 0600  piperacillin-tazobactam (ZOSYN) IVPB 3.375 g  Status:  Discontinued        3.375 g 100 mL/hr over 30 Minutes Intravenous Every 6 hours 12/29/22 0254 12/29/22 0257   12/29/22 0600  piperacillin-tazobactam (ZOSYN) IVPB 3.375 g  Status:  Discontinued        3.375 g 12.5 mL/hr over 240 Minutes Intravenous Every 8 hours 12/29/22 0257 01/03/23 0946   12/29/22 0400  vancomycin (VANCOREADY) IVPB 1250 mg/250 mL  Status:  Discontinued        1,250 mg 166.7 mL/hr over 90 Minutes Intravenous Every 8 hours 12/29/22 0304  12/29/22 1014   12/29/22 0300  vancomycin (VANCOCIN) IVPB 1000 mg/200 mL premix  Status:  Discontinued        1,000 mg 200 mL/hr over 60 Minutes Intravenous  Once 12/29/22 0254 12/29/22 0304   12/28/22 2215  piperacillin-tazobactam (ZOSYN) IVPB 3.375 g        3.375 g 100 mL/hr over 30 Minutes Intravenous  Once 12/28/22 2204 12/28/22 2343   12/28/22 2215  vancomycin (VANCOCIN) IVPB 1000 mg/200 mL premix        1,000 mg 200 mL/hr over 60 Minutes Intravenous  Once 12/28/22 2204 12/29/22 0257       Current Facility-Administered Medications  Medication Dose Route Frequency Provider Last Rate Last Admin   acetaminophen (TYLENOL) tablet 650 mg  650 mg Oral Q6H PRN [name], [name], MD   650 mg at 12/29/22 2045   Or   acetaminophen (TYLENOL) suppository 650 mg  650 mg Rectal Q6H PRN [name], [name], MD       cefTRIAXone (ROCEPHIN) 2 g in sodium chloride 0.9 % 100 mL IVPB  2 g Intravenous Q24H [name], [name], MD       Chlorhexidine Gluconate Cloth 2 % PADS 6 each  6 each Topical  Daily [name], MD       enoxaparin (LOVENOX) injection 50 mg  50 mg Subcutaneous Q24H [name], [name], MD   50 mg at 01/02/23 2125   fentaNYL (SUBLIMAZE) injection 25 mcg  25 mcg Intravenous Q4H PRN [name], [name], MD   25 mcg at 01/03/23 1327   fluconazole (DIFLUCAN) tablet 400 mg  400 mg Oral Daily [name], MD   400 mg at 01/03/23 0943   HYDROcodone-acetaminophen (NORCO/VICODIN) 5-325 MG per tablet 1-2 tablet  1-2 tablet Oral Q6H PRN [name], MD   2 tablet at 01/01/23 1125   insulin aspart (novoLOG) injection 0-9 Units  0-9 Units Subcutaneous Q4H [name], [name]   2 Units at 01/03/23 1332   insulin glargine-yfgn (SEMGLEE) injection 25 Units  25 Units Subcutaneous Daily [name], MD   25 Units at 01/03/23 0941   magnesium hydroxide (MILK OF MAGNESIA) suspension 30 mL  30 mL Oral Daily PRN [name], [name], MD       metroNIDAZOLE (FLAGYL) tablet 500 mg  500 mg Oral Q12H [name], MD        ondansetron [facility]) tablet 4 mg  4 mg Oral Q6H PRN [name], [name], MD       Or   ondansetron [facility]) injection 4 mg  4 mg Intravenous Q6H PRN [name], [name], MD       Oral care mouth rinse  15 mL Mouth Rinse PRN [name], [name], MD       polyethylene glycol (MIRALAX / GLYCOLAX) packet 17 g  17 g Oral Daily [name], MD   17 g at 01/02/23 0856   rosuvastatin (CRESTOR) tablet 5 mg  5 mg Oral Daily [name], MD   5 mg at 01/03/23 0943   sodium chloride flush (NS) 0.9 % injection 10-40 mL  10-40 mL Intracatheter Q12H [name], MD   10 mL at 01/03/23 1333   sodium chloride flush (NS) 0.9 % injection 10-40 mL  10-40 mL Intracatheter PRN [name], MD       traZODone (DESYREL) tablet 25 mg  25 mg Oral QHS PRN [name], [name], MD       Objective: Vital signs in last 24 hours: Temp:  [98.3 F (36.8 C)-98.6 F (37 C)] 98.3 F (36.8 C) (08/02 0430) Pulse Rate:  [61-65] 63 (08/02 0430) Resp:  [18-19] 19 (08/01 2003) BP: (158-177)/(73-89) 158/89 (08/02 0430) SpO2:  [98 %-99 %] 99 % (08/02 0430)  Intake/Output from previous day: 08/01 0701 - 08/02 0700 In: 840 [P.O.:840] Out: 1500 [Urine:1500] Intake/Output this shift: Total I/O In: 120 [P.O.:120] Out: -   Physical Exam Vitals and nursing note reviewed.  Constitutional:      General: He is not in acute distress.    Appearance: He is not ill-appearing, toxic-appearing or diaphoretic.  HENT:     Head: Normocephalic and atraumatic.  Pulmonary:     Effort: Pulmonary effort is normal. No respiratory distress.  Genitourinary:    Comments: Posterior hemiscrotum surgically absent.  Bilateral testes remain viable in appearance.  Yesterday's wet-to-dry dressing was removed in its entirety to reveal decreased oozing bleeding from the wound bed compared to yesterday.  Wound bed continues to appear healthy with granulation tissue throughout.  There was an adherent blood clot at the base of the wound bed inferior to the right testicle,  however this was easily irrigated/wiped away with sterile gauze. Skin:    General: Skin is warm  and dry.  Neurological:     Mental Status: He is alert and oriented to person, place, and time.  Psychiatric:        Mood and Affect: Mood normal.        Behavior: Behavior normal.    Lab Results:  Recent Labs    01/02/23 0444 01/03/23 0443  WBC 6.0 6.8  HGB 10.6* 10.4*  HCT 31.4* 30.2*  PLT 237 305   BMET Recent Labs    01/02/23 1042 01/03/23 0443  NA 130* 133*  K 3.5 3.4*  CL 99 100  CO2 23 27  GLUCOSE 214* 175*  BUN <5* 5*  CREATININE 0.54* 0.65  CALCIUM 8.5* 8.8*   Assessment & Plan: 42 year old diabetic male admitted with Fournier's gangrene and severe hyponatremia, now POD 5 from perineal debridement with Dr. Alvester Morin.  Michiel Cowboy and I exchanged his wet-to-dry dressing at the bedside again today.  He tolerated well with fentanyl.  Given impending discharge, recommend transitioning to p.o. premedication.  We had a lengthy conversation with the patient at the bedside about performing wet-to-dry dressing changes upon discharge.  He states he feels comfortable doing this with assistance from his loved ones, who work in [employer].  I will provide written instructions on how to perform these at home.  I placed an outpatient referral to [facility] today.  Recommendations:  -Continue abx, pain control per primary and ID teams -Recommend PO premedication for dressing changes -Continue once daily wet-to-dry dressing changes with nursing starting tomorrow (4x4 sterile gauze pads moistened with sterile saline to pack the wound and cover the exposed testes, covered with a dry ABD pad, and secured with mesh underwear) -I will put detailed instructions on at-home daily dressing changes in his discharge summary -Outpatient follow up with [facility] for wound closure; referral placed today  Carman Ching,  PA-C 01/03/2023

## 2023-01-04 DIAGNOSIS — N493 Fournier gangrene: Secondary | ICD-10-CM | POA: Diagnosis not present

## 2023-01-04 LAB — GLUCOSE, CAPILLARY
Glucose-Capillary: 117 mg/dL — ABNORMAL HIGH (ref 70–99)
Glucose-Capillary: 145 mg/dL — ABNORMAL HIGH (ref 70–99)
Glucose-Capillary: 154 mg/dL — ABNORMAL HIGH (ref 70–99)
Glucose-Capillary: 177 mg/dL — ABNORMAL HIGH (ref 70–99)
Glucose-Capillary: 200 mg/dL — ABNORMAL HIGH (ref 70–99)
Glucose-Capillary: 339 mg/dL — ABNORMAL HIGH (ref 70–99)

## 2023-01-04 NOTE — Progress Notes (Addendum)
Progress Note   Patient: Steve [name] NGE:[id number] DOB: 03-14-1981 DOA: 12/28/2022     6 DOS: the patient was seen and examined on 01/04/2023     Subjective:  Patient seen and examined Currently cleared for discharge however awaiting home health I have discussed with transition of care manager and according to her home health will be available on Tuesday and so patient will be discharged on Monday after dressing change   Brief hospital course:   Steve [name] is a 42 y.o. Caucasian male with medical history significant for type 2 diabetes mellitus, GERD, essential hypertension and gastritis, presented to the emergency room with acute onset of worsening abscess on his genitals for which he has been seen in the ER couple of days ago and placed on Septra for so far without improvement.  It has become more uncomfortable and more swollen.  He stated that he pushed the air pin into it to alleviate the swelling and increased purulent drainage so that he will feel better.  He denies any fever or chills.  No nausea or vomiting or abdominal pain.  No chest pain or palpitations.  No cough or wheezing or dyspnea.  No dysuria, oliguria or hematuria, urgency or frequency or flank pain.  Blood glucose levels have been elevated.  He had a pelvic CT scan revealing suspicious foreign years gangrene.  Use hyperglycemic as well is hyponatremic.  He was taken to the OR by Dr. Alvester Morin and underwent debridement.   ED Course: When he came to the ER, was's were within normal and later BP was 146/79, heart rate 93 and respiratory rate 22.  Labs revealed a sodium of 118 around was 117 and a chloride of 84 that later on was 85 with blood glucose of 318 and later 310, calcium of 8.1 and later 8, alk phos of 233 and later 232 with albumin of 2.8 and later the same, AST 97 and later 94, ALT 124 and later 121 and total bili of 3 and later 2.9.  Lactic acid was 1.8 and CBC showed microcytosis and was otherwise  unremarkable. EKG as reviewed by me : None yet. Imaging: Pelvic CT with contrast revealed the following: Constellation of findings are concerning for necrotizing infection the scrotum. No organized abscess.   The patient was given 4 mg of IV morphine sulfate and 4 mg of IV Zofran as well as IV Zosyn and vancomycin.  He underwent debridement of his Fournier's gangrene as mentioned above by Dr. Alvester Morin.  We were asked to consult and admit the patient postoperatively.  He will be admitted to a stepdown unit bed from the PACU for further evaluation and management.       Assessment and Plan: Sepsis POA  Fournier's gangrene of scrotum - s/p emergent post debridement of his fournier's gangrene. - Antibiotics change to ceftriaxone, fluconazole and metronidazole Discussed with infectious disease, transition of care manager, urology team Continue current antibiotics Patient has PICC line placed Currently cleared for discharge however awaiting home health I have discussed with transition of care manager and according to her home health will be available on Tuesday and so patient will be discharged on Monday after dressing change Infectious disease has recommended the following antibiotics for discharge Ceftriaxone 2 grams IV every 24 hrs  + Fluconazole 400mg  once a day + Flagyl 500mg  PO BID   Duration: 2 weeks End Date: 01/17/23 Continue to follow-up on culture results Continue current pain management      Severe Hyponatremia-improved  Hyperglycemia Presented with sodium 117 -- Most likely secondary to hypoglycemia.  Improved but not back to normal values - Continue to monitor sodium closely   Type 2 diabetes mellitus with hyperglycemia, without long-term current use of insulin (HCC), uncontrolled - Hemoglobin A1c is 13.5 -- Continue Semglee 25 units and sliding scale coverage -- Continue current insulin therapy monitor glucose levels closely    Dyslipidemia Continue current statin  therapy   Essential hypertension - apparently diet managed and his BP has been fairly controlled. However BP noted to be elevated today I have ordered 5 mg of amlodipine We will continue to monitor blood pressure closely   GERD without esophagitis Continue PPI therapy     Constipation Continue MiraLAX     Hypokalemia-improved Continue to monitor potassium level   EXTREMITIES: No  edema b/l.    NEUROLOGIC: nonfocal  patient is alert and awake       Data Reviewed: I have reviewed patient's CBC, BMP, nursing documentation transition of care manager documentation   Discussed with transition of care management   Family Communication: Plan of care discussed with patient   Disposition: Status is: Inpatient Remains inpatient appropriate because: On IV antibiotics and receiving wound care  Planned Discharge Destination: Home   Physical examination:  GENERAL:  42 y.o.-year-old patient with no acute distress.  LUNGS: Normal breath sounds bilaterally, no wheezing CARDIOVASCULAR: S1, S2 normal. No murmur   ABDOMEN: Soft, nontender, nondistended. Bowel sounds present. Packing + in the perineal area    Vitals:   01/03/23 1521 01/03/23 2011 01/04/23 0435 01/04/23 0825  BP: (!) 152/83 (!) 164/85 (!) 143/76 133/70  Pulse: (!) 59 (!) 55 (!) 58 (!) 59  Resp: 18 18 18 16   Temp: 98 F (36.7 C) 98.2 F (36.8 C) 98.1 F (36.7 C) 98.1 F (36.7 C)  TempSrc:  Oral Oral   SpO2: 100% 97% 100% 99%  Weight:      Height:         Author: [name], MD 01/04/2023 4:32 PM  For on call review [email or web address].

## 2023-01-05 DIAGNOSIS — N493 Fournier gangrene: Secondary | ICD-10-CM | POA: Diagnosis not present

## 2023-01-05 LAB — BASIC METABOLIC PANEL WITH GFR
Anion gap: 5 (ref 5–15)
BUN: 5 mg/dL — ABNORMAL LOW (ref 6–20)
CO2: 24 mmol/L (ref 22–32)
Calcium: 8.8 mg/dL — ABNORMAL LOW (ref 8.9–10.3)
Chloride: 103 mmol/L (ref 98–111)
Creatinine, Ser: 0.65 mg/dL (ref 0.61–1.24)
GFR, Estimated: >60 mL/min (ref >60)
Glucose, Bld: 131 mg/dL — ABNORMAL HIGH (ref 70–99)
Potassium: 4.7 mmol/L (ref 3.5–5.1)
Sodium: 132 mmol/L — ABNORMAL LOW (ref 135–145)

## 2023-01-05 LAB — CBC WITH DIFFERENTIAL/PLATELET
Abs Immature Granulocytes: 0.1 10*3/uL — ABNORMAL HIGH (ref 0.00–0.07)
Basophils Absolute: 0 10*3/uL (ref 0.0–0.1)
Basophils Relative: 1 %
Eosinophils Absolute: 0.1 10*3/uL (ref 0.0–0.5)
Eosinophils Relative: 1 %
HCT: 32.3 % — ABNORMAL LOW (ref 39.0–52.0)
Hemoglobin: 10.8 g/dL — ABNORMAL LOW (ref 13.0–17.0)
Immature Granulocytes: 1 %
Lymphocytes Relative: 23 %
Lymphs Abs: 1.7 10*3/uL (ref 0.7–4.0)
MCH: 27.8 pg (ref 26.0–34.0)
MCHC: 33.4 g/dL (ref 30.0–36.0)
MCV: 83.2 fL (ref 80.0–100.0)
Monocytes Absolute: 0.9 10*3/uL (ref 0.1–1.0)
Monocytes Relative: 12 %
Neutro Abs: 4.6 10*3/uL (ref 1.7–7.7)
Neutrophils Relative %: 62 %
Platelets: 411 10*3/uL — ABNORMAL HIGH (ref 150–400)
RBC: 3.88 MIL/uL — ABNORMAL LOW (ref 4.22–5.81)
RDW: 12.9 % (ref 11.5–15.5)
WBC: 7.4 10*3/uL (ref 4.0–10.5)
nRBC: 0 % (ref 0.0–0.2)

## 2023-01-05 LAB — GLUCOSE, CAPILLARY
Glucose-Capillary: 141 mg/dL — ABNORMAL HIGH (ref 70–99)
Glucose-Capillary: 133 mg/dL — ABNORMAL HIGH (ref 70–99)
Glucose-Capillary: 157 mg/dL — ABNORMAL HIGH (ref 70–99)
Glucose-Capillary: 149 mg/dL — ABNORMAL HIGH (ref 70–99)
Glucose-Capillary: 149 mg/dL — ABNORMAL HIGH (ref 70–99)
Glucose-Capillary: 149 mg/dL — ABNORMAL HIGH (ref 70–99)

## 2023-01-05 NOTE — Progress Notes (Addendum)
Progress Note   Patient: Steve [name] GMW:[id number] DOB: [birth year] DOA: 12/28/2022     7 DOS: the patient was seen and examined on 01/05/2023       Subjective:  Patient seen and examined at bedside this morning Currently medically stable for discharge however pending home health arrangement on Tuesday Denies nausea vomiting abdominal pain perineal pain or cough   Brief hospital course:   Steve [name] is a 42 y.o. Caucasian male with medical history significant for type 2 diabetes mellitus, GERD, essential hypertension and gastritis, presented to the emergency room with acute onset of worsening abscess on his genitals for which he has been seen in the ER couple of days ago and placed on Septra for so far without improvement.  It has become more uncomfortable and more swollen.  He stated that he pushed the air pin into it to alleviate the swelling and increased purulent drainage so that he will feel better.  He denies any fever or chills.  No nausea or vomiting or abdominal pain.  No chest pain or palpitations.  No cough or wheezing or dyspnea.  No dysuria, oliguria or hematuria, urgency or frequency or flank pain.  Blood glucose levels have been elevated.  He had a pelvic CT scan revealing suspicious foreign years gangrene.  Use hyperglycemic as well is hyponatremic.  He was taken to the OR by Dr. Alvester Morin and underwent debridement.   ED Course: When he came to the ER, was's were within normal and later BP was 146/79, heart rate 93 and respiratory rate 22.  Labs revealed a sodium of 118 around was 117 and a chloride of 84 that later on was 85 with blood glucose of 318 and later 310, calcium of 8.1 and later 8, alk phos of 233 and later 232 with albumin of 2.8 and later the same, AST 97 and later 94, ALT 124 and later 121 and total bili of 3 and later 2.9.  Lactic acid was 1.8 and CBC showed microcytosis and was otherwise unremarkable. EKG as reviewed by me : None yet. Imaging: Pelvic CT with  contrast revealed the following: Constellation of findings are concerning for necrotizing infection the scrotum. No organized abscess.   The patient was given 4 mg of IV morphine sulfate and 4 mg of IV Zofran as well as IV Zosyn and vancomycin.  He underwent debridement of his Fournier's gangrene as mentioned above by Dr. Alvester Morin.  We were asked to consult and admit the patient postoperatively.  He will be admitted to a stepdown unit bed from the PACU for further evaluation and management.       Assessment and Plan: Sepsis POA  Fournier's gangrene of scrotum - s/p emergent post debridement of his fournier's gangrene. - Antibiotics change to ceftriaxone, fluconazole and metronidazole Discussed with infectious disease, transition of care manager, urology team  Patient has PICC line placed Currently cleared for discharge however awaiting home health Continue current antibiotics as recommended by infectious disease Infectious disease has recommended the following antibiotics for discharge Continue ceftriaxone 2 grams IV every 24 hrs  Continue fluconazole 400mg  once a day Continue Flagyl 500mg  PO BID   Duration: 2 weeks End Date: 01/17/23 Continue to follow-up on culture results Continue current pain management      Severe Hyponatremia-improved Hyperglycemia Presented with sodium 117 -- Most likely secondary to hypoglycemia.  Improved but not back to normal values Monitor sodium closely   Type 2 diabetes mellitus with hyperglycemia, without long-term current use  of insulin (HCC), uncontrolled - Hemoglobin A1c is 13.5 -- Continue Semglee 25 units and sliding scale coverage -- Continue current insulin therapy monitor glucose levels closely     Dyslipidemia Continue statin therapy   Essential hypertension - apparently diet managed and his BP has been fairly controlled. However BP noted to be elevated today I have ordered 5 mg of amlodipine We will continue to monitor blood  pressure closely   GERD without esophagitis Continue PPI     Constipation Continue MiraLAX     Hypokalemia-improved Continue to monitor potassium level   EXTREMITIES: No  edema b/l.    NEUROLOGIC: nonfocal  patient is alert and awake       Data Reviewed: I have reviewed patient's CBC BMP nursing documentation and transition of care manager documentation   Family Communication: Plan of care discussed with patient   Disposition: Status is: Inpatient Remains inpatient appropriate because: On IV antibiotics and receiving wound care  Planned Discharge Destination: Home         GENERAL: Middle-age male laying in bed in no acute distress LUNGS: Normal breath sounds bilaterally, no wheezing CARDIOVASCULAR: S1, S2 normal. No murmur   ABDOMEN: Soft, nontender, nondistended. Bowel sounds present. Packing + in the perineal area  Vitals:   01/04/23 0825 01/04/23 1908 01/05/23 0343 01/05/23 0800  BP: 133/70 (!) 168/84 (!) 163/87 (!) 163/80  Pulse: (!) 59 (!) 58 (!) 55 61  Resp: 16 16 16 17   Temp: 98.1 F (36.7 C) 98.4 F (36.9 C) 98.1 F (36.7 C) 98.4 F (36.9 C)  TempSrc:  Oral Oral Oral  SpO2: 99% 99% 98% 98%  Weight:      Height:         Author: [name], MD 01/05/2023 4:20 PM  For on call review [email or web address].

## 2023-01-05 NOTE — Plan of Care (Signed)

## 2023-01-05 NOTE — TOC Progression Note (Signed)
Transition of Care [facility]) - Progression Note    Patient Details  Name: Steve [name] MRN: [id number] Date of Birth: Jun 10, 1980  Transition of Care [facility]) CM/SW Contact  Susa Simmonds, [place] Phone Number: 01/05/2023, 9:08 AM  Clinical Narrative:   Elnita Maxwell with [facility] can accepted patient for home health RN with the start of care 01/07/23.          Expected Discharge Plan and Services                                     [facility] Agency: [facility] Date [facility] Agency Contacted: 12/30/22 Time HH Agency Contacted: 1147 Representative spoke with at [facility] Agency: Jeralene Peters of [facility]   Social Determinants of Health (SDOH) Interventions SDOH Screenings   Food Insecurity: No Food Insecurity (12/29/2022)  Housing: Low Risk  (12/29/2022)  Recent Concern: Housing - Medium Risk (12/03/2022)  Transportation Needs: No Transportation Needs (12/29/2022)  Utilities: At Risk (12/29/2022)  Alcohol Screen: Low Risk  (12/02/2022)  Depression (PHQ2-9): Low Risk  (12/02/2022)  Recent Concern: Depression (PHQ2-9) - Medium Risk (11/18/2022)  Financial Resource Strain: Medium Risk (12/02/2022)  Physical Activity: Sufficiently Active (12/02/2022)  Social Connections: Socially Isolated (12/02/2022)  Stress: No Stress Concern Present (12/02/2022)  Tobacco Use: High Risk (12/29/2022)    Readmission Risk Interventions     No data to display

## 2023-01-06 DIAGNOSIS — N493 Fournier gangrene: Secondary | ICD-10-CM | POA: Diagnosis not present

## 2023-01-06 LAB — GLUCOSE, CAPILLARY
Glucose-Capillary: 155 mg/dL — ABNORMAL HIGH (ref 70–99)
Glucose-Capillary: 137 mg/dL — ABNORMAL HIGH (ref 70–99)
Glucose-Capillary: 147 mg/dL — ABNORMAL HIGH (ref 70–99)

## 2023-01-06 MED ORDER — LANCET DEVICE MISC: 1.0000 | Freq: Three times a day (TID) | 0 refills | Status: AC

## 2023-01-06 MED ORDER — TRAZODONE HCL 50 MG PO TABS: 25.0000 mg | ORAL_TABLET | Freq: Every evening | ORAL | 0 refills | Status: DC | PRN

## 2023-01-06 MED ORDER — ACETAMINOPHEN 325 MG PO TABS: 650.0000 mg | ORAL_TABLET | Freq: Four times a day (QID) | ORAL | 0 refills | Status: DC | PRN

## 2023-01-06 MED ORDER — FLUCONAZOLE 200 MG PO TABS: 400.0000 mg | ORAL_TABLET | Freq: Every day | ORAL | 0 refills | Status: AC

## 2023-01-06 MED ORDER — POLYETHYLENE GLYCOL 3350 17 G PO PACK: 17.0000 g | PACK | Freq: Every day | ORAL | 0 refills | Status: DC

## 2023-01-06 MED ORDER — BLOOD GLUCOSE TEST VI STRP: 1.0000 | ORAL_STRIP | Freq: Three times a day (TID) | 0 refills | Status: DC

## 2023-01-06 MED ORDER — TIRZEPATIDE 2.5 MG/0.5ML ~~LOC~~ SOAJ
2.5000 mg | SUBCUTANEOUS | 0 refills | Status: DC
Start: 2023-01-06 — End: 2023-01-30

## 2023-01-06 MED ORDER — CEFTRIAXONE IV (FOR PTA / DISCHARGE USE ONLY): 2.0000 g | INTRAVENOUS | 0 refills | Status: AC

## 2023-01-06 MED ORDER — METRONIDAZOLE 500 MG PO TABS: 500.0000 mg | ORAL_TABLET | Freq: Two times a day (BID) | ORAL | 0 refills | Status: DC

## 2023-01-06 MED ORDER — AMLODIPINE BESYLATE 5 MG PO TABS: 5.0000 mg | ORAL_TABLET | Freq: Every day | ORAL | 2 refills | Status: DC

## 2023-01-06 MED ORDER — BLOOD GLUCOSE MONITORING SUPPL DEVI: 1.0000 | Freq: Three times a day (TID) | 0 refills | Status: DC

## 2023-01-06 MED ORDER — HYDROCODONE-ACETAMINOPHEN 5-325 MG PO TABS: 1.0000 | ORAL_TABLET | Freq: Four times a day (QID) | ORAL | 0 refills | Status: AC | PRN

## 2023-01-06 NOTE — TOC Transition Note (Signed)
Transition of Care [facility]) - CM/SW Discharge Note   Patient Details  Name: Steve [name] MRN: [id number] Date of Birth: Aug 03, 1980  Transition of Care [facility]) CM/SW Contact:  Truddie Hidden, RN Phone Number: 01/06/2023, 11:43 AM   Clinical Narrative:    Patient discharging home.  [name] spoke with Jeri Modena  from [facility]. Patients family has been taught.  Cheryl from [facility] notified.  Spoke with patient regarding discharge home today. His daughter will pick him up. He was advised a representative from [facility] would contact him to schedule his SOC.   TOC signing off.            Patient Goals and CMS Choice      Discharge Placement                         Discharge Plan and Services Additional resources added to the After Visit Summary for                              [facility] Agency: [facility] Date [facility] Agency Contacted: 12/30/22 Time HH Agency Contacted: [phone] Representative spoke with at [facility] Agency: Jeralene Peters of [facility]  Social Determinants of Health (SDOH) Interventions SDOH Screenings   Food Insecurity: No Food Insecurity (12/29/2022)  Housing: Low Risk  (12/29/2022)  Recent Concern: Housing - Medium Risk (12/03/2022)  Transportation Needs: No Transportation Needs (12/29/2022)  Utilities: At Risk (12/29/2022)  Alcohol Screen: Low Risk  (12/02/2022)  Depression (PHQ2-9): Low Risk  (12/02/2022)  Recent Concern: Depression (PHQ2-9) - Medium Risk (11/18/2022)  Financial Resource Strain: Medium Risk (12/02/2022)  Physical Activity: Sufficiently Active (12/02/2022)  Social Connections: Socially Isolated (12/02/2022)  Stress: No Stress Concern Present (12/02/2022)  Tobacco Use: High Risk (12/29/2022)     Readmission Risk Interventions     No data to display

## 2023-01-06 NOTE — Progress Notes (Signed)
Urology Inpatient Progress Note  Subjective: No acute events overnight, remains on abx as below. Discharging today, home health has been arranged and friend has been instructed on dressing changes. Anticipate abx completion 8/16. I spoke with [name], PA-C from plastic surgery; possibility of patient needing to see wound care prior to referral to their clinic.  Anti-infectives: Anti-infectives (From admission, onward)    Start     Dose/Rate Route Frequency Ordered Stop   01/07/23 0000  cefTRIAXone (ROCEPHIN) IVPB        2 g Intravenous Every 24 hours 01/06/23 1151 01/17/23 2359   01/07/23 0000  fluconazole (DIFLUCAN) 200 MG tablet        400 mg Oral Daily 01/06/23 1151 01/17/23 2359   01/06/23 0000  metroNIDAZOLE (FLAGYL) 500 MG tablet        500 mg Oral 2 times daily 01/06/23 1151 01/17/23 2359   01/03/23 1200  cefTRIAXone (ROCEPHIN) 2 g in sodium chloride 0.9 % 100 mL IVPB        2 g 200 mL/hr over 30 Minutes Intravenous Every 24 hours 01/03/23 0946     01/03/23 1045  metroNIDAZOLE (FLAGYL) tablet 500 mg        500 mg Oral Every 12 hours 01/03/23 0946     01/01/23 1815  fluconazole (DIFLUCAN) tablet 400 mg        400 mg Oral Daily 01/01/23 1715     12/29/22 1600  vancomycin (VANCOREADY) IVPB 1500 mg/300 mL  Status:  Discontinued        1,500 mg 150 mL/hr over 120 Minutes Intravenous Every 12 hours 12/29/22 1014 12/29/22 1329   12/29/22 1430  linezolid (ZYVOX) IVPB 600 mg  Status:  Discontinued        600 mg 300 mL/hr over 60 Minutes Intravenous Every 12 hours 12/29/22 1329 01/02/23 1327   12/29/22 0600  piperacillin-tazobactam (ZOSYN) IVPB 3.375 g  Status:  Discontinued        3.375 g 100 mL/hr over 30 Minutes Intravenous Every 6 hours 12/29/22 0254 12/29/22 0257   12/29/22 0600  piperacillin-tazobactam (ZOSYN) IVPB 3.375 g  Status:  Discontinued        3.375 g 12.5 mL/hr over 240 Minutes Intravenous Every 8 hours 12/29/22 0257 01/03/23 0946   12/29/22 0400  vancomycin  (VANCOREADY) IVPB 1250 mg/250 mL  Status:  Discontinued        1,250 mg 166.7 mL/hr over 90 Minutes Intravenous Every 8 hours 12/29/22 0304 12/29/22 1014   12/29/22 0300  vancomycin (VANCOCIN) IVPB 1000 mg/200 mL premix  Status:  Discontinued        1,000 mg 200 mL/hr over 60 Minutes Intravenous  Once 12/29/22 0254 12/29/22 0304   12/28/22 2215  piperacillin-tazobactam (ZOSYN) IVPB 3.375 g        3.375 g 100 mL/hr over 30 Minutes Intravenous  Once 12/28/22 2204 12/28/22 2343   12/28/22 2215  vancomycin (VANCOCIN) IVPB 1000 mg/200 mL premix        1,000 mg 200 mL/hr over 60 Minutes Intravenous  Once 12/28/22 2204 12/29/22 0257       Current Facility-Administered Medications  Medication Dose Route Frequency Provider Last Rate Last Admin   acetaminophen (TYLENOL) tablet 650 mg  650 mg Oral Q6H PRN [name], [name], MD   650 mg at 12/29/22 2045   Or   acetaminophen (TYLENOL) suppository 650 mg  650 mg Rectal Q6H PRN [name], [name], MD       amLODipine (NORVASC) tablet  5 mg  5 mg Oral Daily [name], MD   5 mg at 01/06/23 2355   cefTRIAXone (ROCEPHIN) 2 g in sodium chloride 0.9 % 100 mL IVPB  2 g Intravenous Q24H [name], MD 200 mL/hr at 01/06/23 0113 Infusion Verify at 01/06/23 0113   Chlorhexidine Gluconate Cloth 2 % PADS 6 each  6 each Topical Daily [name], MD   6 each at 01/06/23 0927   enoxaparin (LOVENOX) injection 50 mg  50 mg Subcutaneous Q24H [name], [name], MD   50 mg at 01/05/23 2131   fentaNYL (SUBLIMAZE) injection 25 mcg  25 mcg Intravenous Q4H PRN [name], [name], MD   25 mcg at 01/05/23 1632   fluconazole (DIFLUCAN) tablet 400 mg  400 mg Oral Daily [name], MD   400 mg at 01/06/23 7322   HYDROcodone-acetaminophen (NORCO/VICODIN) 5-325 MG per tablet 1-2 tablet  1-2 tablet Oral Q6H PRN [name], MD   1 tablet at 01/06/23 1207   insulin aspart (novoLOG) injection 0-9 Units  0-9 Units Subcutaneous Q4H [name], [name]   1 Units at 01/06/23  1207   insulin glargine-yfgn [facility]) injection 25 Units  25 Units Subcutaneous Daily [name], MD   25 Units at 01/06/23 0254   magnesium hydroxide (MILK OF MAGNESIA) suspension 30 mL  30 mL Oral Daily PRN [name], [name], MD       metroNIDAZOLE (FLAGYL) tablet 500 mg  500 mg Oral Q12H [name], MD   500 mg at 01/06/23 0922   ondansetron (ZOFRAN) tablet 4 mg  4 mg Oral Q6H PRN [name], [name], MD       Or   ondansetron [facility]) injection 4 mg  4 mg Intravenous Q6H PRN [name], [name], MD       Oral care mouth rinse  15 mL Mouth Rinse PRN [name], [name], MD       polyethylene glycol (MIRALAX / GLYCOLAX) packet 17 g  17 g Oral Daily [name], MD   17 g at 01/06/23 2706   rosuvastatin (CRESTOR) tablet 5 mg  5 mg Oral Daily [name], MD   5 mg at 01/06/23 0926   sodium chloride flush (NS) 0.9 % injection 10-40 mL  10-40 mL Intracatheter Q12H [name], MD   10 mL at 01/06/23 0932   sodium chloride flush (NS) 0.9 % injection 10-40 mL  10-40 mL Intracatheter PRN [name], MD       traZODone (DESYREL) tablet 25 mg  25 mg Oral QHS PRN [name], [name], MD       Objective: Vital signs in last 24 hours: Temp:  [97.8 F (36.6 C)-98.6 F (37 C)] 97.8 F (36.6 C) (08/05 0756) Pulse Rate:  [55-62] 62 (08/05 0756) Resp:  [16-18] 18 (08/05 0756) BP: (139-146)/(69-87) 139/74 (08/05 0756) SpO2:  [98 %] 98 % (08/05 0756)  Intake/Output from previous day: 08/04 0701 - 08/05 0700 In: 800 [P.O.:600; IV Piggyback:200] Out: 1800 [Urine:1800] Intake/Output this shift: Total I/O In: 0  Out: 550 [Urine:550]  Physical Exam Constitutional:      General: He is not in acute distress.    Appearance: He is not ill-appearing, toxic-appearing or diaphoretic.  HENT:     Head: Normocephalic and atraumatic.  Pulmonary:     Effort: Pulmonary effort is normal. No respiratory distress.  Genitourinary:    Comments: Posterior hemiscrotum surgically absent, open perineal wound is stable in size.  Wound bed with  healthy-appearing granulation tissue throughout, slight increase in fibrinous exudate along the right wound bed adjacent to the testicle. Skin:    General: Skin is warm and dry.  Neurological:     Mental Status: He is alert and oriented to person, place, and time.  Psychiatric:        Mood and Affect: Mood normal.        Behavior: Behavior normal.     Lab Results:  Recent Labs    01/04/23 0436 01/05/23 0400  WBC 8.4 7.4  HGB 10.4* 10.8*  HCT 31.8* 32.3*  PLT 345 411*   BMET Recent Labs    01/05/23 0400 01/06/23 0420  NA 132* 133*  K 4.7 4.6  CL 103 100  CO2 24 26  GLUCOSE 131* 151*  BUN 5* 8  CREATININE 0.65 0.73  CALCIUM 8.8* 9.4   Assessment & Plan: 42 year old diabetic male admitted with Fournier's gangrene and severe hyponatremia, now POD 8 from perineal debridement with Dr. Alvester Morin.  I exchanged his wet-to-dry dressing at the bedside again today.  He tolerated well with p.o. premedication.  We reviewed instructions for daily wet-to-dry dressing changes upon discharge and he is in agreement.  Agree with outpatient IV antibiotics per ID.  Plastic surgery referral has been approved, however they may require he be seen by wound first.  Process to be confirmed with plastic surgery MDs, who are in the OR today and unavailable for direct communication.  I will continue to work with plastic surgery to get necessary referrals placed for him to be seen for anticipated wound closure.  Carman Ching, PA-C 01/06/2023

## 2023-01-06 NOTE — Care Management Important Message (Signed)
Important Message  Patient Details  Name: Steve [name] MRN: [id number] Date of Birth: 1980-12-06   Medicare Important Message Given:  Yes     Johnell Comings 01/06/2023, 11:06 AM

## 2023-01-06 NOTE — Discharge Summary (Signed)
Physician Discharge Summary   Patient: Steve [name] MRN: [id number] DOB: 01-15-1981  Admit date:     12/28/2022  Discharge date: 01/06/23  Discharge Physician: [name]   PCP: [facility]     Discharge Diagnoses: Sepsis POA  Fournier's gangrene of scrotum- s/p emergent post debridement of his fournier's gangrene. Severe Hyponatremia-improved Hyperglycemia Type 2 diabetes mellitus with hyperglycemia, without long-term current use of insulin (HCC), uncontrolled Dyslipidemia Essential hypertension GERD without esophagitis Constipation Hypokalemia  Hospital Course: Steve [name] is a 42 y.o. male with medical history significant for type 2 diabetes mellitus, GERD, essential hypertension and gastritis, presented to the emergency room with acute onset of worsening abscess on his genitals for which he has been seen in the ER couple of days ago and placed on Septra for so far without improvement. He had a pelvic CT scan revealing suspicious for gangrene. He was taken to the OR by Dr. Alvester Morin and underwent debridement of his Fournier's gangrene of the scrotum.  Patient was followed up by urologist postoperatively and dressing changes were made.  Patient has also been seen by infectious disease.  Urologist also made an outpatient follow-up appointment with the plastic surgeon, [place] plastic surgery specialist for wound closure.  Infectious disease has recommended the following antibiotics for discharge: Continue ceftriaxone 2 grams IV every 24 hrs  Continue fluconazole 400mg  once a day Continue Flagyl 500mg  PO BID   Duration: 2 weeks End Date: 01/17/23  The scrotal wound is supposed to be dressed as shown below: Continue once daily wet-to-dry dressing changes with nursing  (4x4 sterile gauze pads moistened with sterile saline to pack the wound and cover the exposed testes, covered with a dry ABD pad, and secured with mesh underwear   Consultants: Infectious  disease, urology Procedures performed: Debridement of Fournier's gangrene Disposition: Home health Diet recommendation:  Carb modified diet DISCHARGE MEDICATION: Allergies as of 01/06/2023   No Known Allergies      Medication List     STOP taking these medications    sulfamethoxazole-trimethoprim 800-160 MG tablet Commonly known as: BACTRIM DS       TAKE these medications    acetaminophen 325 MG tablet Commonly known as: TYLENOL Take 2 tablets (650 mg total) by mouth every 6 (six) hours as needed for mild pain (or Fever >/= 101).   amLODipine 5 MG tablet Commonly known as: NORVASC Take 1 tablet (5 mg total) by mouth daily. Start taking on: January 07, 2023   Blood Glucose Monitoring Suppl Devi 1 each by Does not apply route in the morning, at noon, and at bedtime. May substitute to any manufacturer covered by patient's insurance.   BLOOD GLUCOSE TEST STRIPS Strp 1 each by In Vitro route in the morning, at noon, and at bedtime. May substitute to any manufacturer covered by patient's insurance.   cefTRIAXone IVPB Commonly known as: ROCEPHIN Inject 2 g into the vein daily for 10 days. Indication:  Fourniers gangrene First Dose: Yes Last Day of Therapy: 01/17/23 Labs - Once weekly:  CBC/D and CMP Method of administration: IV Push Method of administration may be changed at the discretion of home infusion pharmacist based upon assessment of the patient and/or caregiver's ability to self-administer the medication ordered. Fax weekly lab results promptly to [phone] Call [phone] with any questions Please pull PIC at completion of IV antibiotics Start taking on: January 07, 2023   dexlansoprazole 60 MG capsule Commonly known as: DEXILANT TAKE 1 CAPSULE  BY MOUTH EVERY DAY   fluconazole 200 MG tablet Commonly known as: Diflucan Take 2 tablets (400 mg total) by mouth daily for 10 days. Start taking on: January 07, 2023   HYDROcodone-acetaminophen 5-325 MG  tablet Commonly known as: NORCO/VICODIN Take 1 tablet by mouth every 6 (six) hours as needed for up to 3 days for severe pain.   Lancet Device Misc 1 each by Does not apply route in the morning, at noon, and at bedtime. May substitute to any manufacturer covered by patient's insurance.   metroNIDAZOLE 500 MG tablet Commonly known as: Flagyl Take 1 tablet (500 mg total) by mouth 2 (two) times daily for 11 days.   ondansetron 4 MG disintegrating tablet Commonly known as: Zofran ODT Take 1 tablet (4 mg total) by mouth every 8 (eight) hours as needed for nausea or vomiting.   polyethylene glycol 17 g packet Commonly known as: MIRALAX / GLYCOLAX Take 17 g by mouth daily. Start taking on: January 07, 2023   rosuvastatin 5 MG tablet Commonly known as: Crestor Take 1 tablet (5 mg total) by mouth daily.   tirzepatide 2.5 MG/0.5ML Pen Commonly known as: MOUNJARO Inject 2.5 mg into the skin once a week.   traZODone 50 MG tablet Commonly known as: DESYREL Take 0.5 tablets (25 mg total) by mouth at bedtime as needed for sleep.               Discharge Care Instructions  (From admission, onward)           Start     Ordered   01/06/23 0000  Change dressing on IV access line weekly and PRN  (Home infusion instructions - [facility] )        01/06/23 1151            Discharge Exam: Filed Weights   12/28/22 2039  Weight: 107 kg   GENERAL: Middle-age male laying in bed in no acute distress LUNGS: Normal breath sounds bilaterally, no wheezing CARDIOVASCULAR: S1, S2 normal. No murmur   ABDOMEN: Soft, nontender, nondistended. Bowel sounds present.  Dressing in the scrotal area is clean and dry  Condition at discharge: good   Discharge time spent:  40 minutes.  Signed: [name], MD [facility] 01/06/2023

## 2023-01-07 ENCOUNTER — Ambulatory Visit: Payer: Self-pay

## 2023-01-07 ENCOUNTER — Telehealth: Payer: Self-pay | Admitting: *Deleted

## 2023-01-07 ENCOUNTER — Telehealth: Payer: Self-pay

## 2023-01-07 DIAGNOSIS — Z792 Long term (current) use of antibiotics: Secondary | ICD-10-CM | POA: Diagnosis not present

## 2023-01-07 DIAGNOSIS — E1165 Type 2 diabetes mellitus with hyperglycemia: Secondary | ICD-10-CM | POA: Diagnosis not present

## 2023-01-07 DIAGNOSIS — I700 Atherosclerosis of aorta: Secondary | ICD-10-CM | POA: Diagnosis not present

## 2023-01-07 DIAGNOSIS — A419 Sepsis, unspecified organism: Secondary | ICD-10-CM | POA: Diagnosis not present

## 2023-01-07 DIAGNOSIS — I10 Essential (primary) hypertension: Secondary | ICD-10-CM | POA: Diagnosis not present

## 2023-01-07 DIAGNOSIS — E7030 Albinism, unspecified: Secondary | ICD-10-CM | POA: Diagnosis not present

## 2023-01-07 DIAGNOSIS — E785 Hyperlipidemia, unspecified: Secondary | ICD-10-CM | POA: Diagnosis not present

## 2023-01-07 DIAGNOSIS — Z5181 Encounter for therapeutic drug level monitoring: Secondary | ICD-10-CM | POA: Diagnosis not present

## 2023-01-07 NOTE — Chronic Care Management (AMB) (Signed)
   01/07/2023  KADER DELONE 06/12/1980 [id number]   Reason for Encounter: Patient is not currently enrolled in the CCM program. CCM status changed to previously enrolled  Alto Denver RN, MSN, CCM RN Care Manager  [facility]  Ambulatory Care Management  Direct Number: [phone]

## 2023-01-07 NOTE — Transitions of Care (Post Inpatient/ED Visit) (Signed)
01/07/2023  Name: DIANE BUNDREN MRN: [id number] DOB: 1980/11/22  Today's TOC FU Call Status: Today's TOC FU Call Status:: Successful TOC FU Call Completed TOC FU Call Complete Date: 01/07/23  Transition Care Management Follow-up Telephone Call Date of Discharge: 01/06/23 Discharge Facility: [facility]) Type of Discharge: Inpatient Admission Primary Inpatient Discharge Diagnosis:: Fournier's gangrene of scrotum How have you been since you were released from the hospital?: Better Any questions or concerns?: Yes Patient Questions/Concerns:: Patient wanted to know the name of urologist and the name of plastic surgeon. The nurse at the home wanted to know where to send the labs to. RN looked all the information up and gave the names and numbers. I gave the RN drawing the labs where to send the results to and the number to call if any questions. Patient Questions/Concerns Addressed: Other:  Items Reviewed: Did you receive and understand the discharge instructions provided?: Yes Medications obtained,verified, and reconciled?: Yes (Medications Reviewed) Any new allergies since your discharge?: No Do you have support at home?: Yes People in Home: alone Name of Support/Comfort Primary Source: brenda  Medications Reviewed Today: Medications Reviewed Today     Reviewed by Luella Cook, RN (Case Manager) on 01/07/23 at 1135  Med List Status: <None>   Medication Order Taking? Sig Documenting Provider Last Dose Status Informant  acetaminophen (TYLENOL) 325 MG tablet [id number] Yes Take 2 tablets (650 mg total) by mouth every 6 (six) hours as needed for mild pain (or Fever >/= 101). [name], MD Taking Active   amLODipine (NORVASC) 5 MG tablet [id number] Yes Take 1 tablet (5 mg total) by mouth daily. [name], MD Taking Active   Blood Glucose Monitoring Suppl DEVI [id number] Yes 1 each by Does not apply route in the morning, at noon, and at bedtime.  May substitute to any manufacturer covered by patient's insurance. [name], MD Taking Active   cefTRIAXone (ROCEPHIN) IVPB [id number] Yes Inject 2 g into the vein daily for 10 days. Indication:  Fourniers gangrene First Dose: Yes Last Day of Therapy: 01/17/23 Labs - Once weekly:  CBC/D and CMP Method of administration: IV Push Method of administration may be changed at the discretion of home infusion pharmacist based upon assessment of the patient and/or caregiver's ability to self-administer the medication ordered. Fax weekly lab results promptly to [phone] Call [phone] with any questions Please pull PIC at completion of IV antibiotics Djan, Scarlette Calico, MD Taking Active   dexlansoprazole (DEXILANT) 60 MG capsule [id number] Yes TAKE 1 CAPSULE BY MOUTH EVERY DAY Larae Grooms, NP Taking Active Self           Med Note Rosalio Macadamia   VOJ Dec 28, 2022 11:34 PM) Pt taking prn  fluconazole (DIFLUCAN) 200 MG tablet [id number] Yes Take 2 tablets (400 mg total) by mouth daily for 10 days. [name], MD Taking Active   Glucose Blood (BLOOD GLUCOSE TEST STRIPS) STRP [id number] Yes 1 each by In Vitro route in the morning, at noon, and at bedtime. May substitute to any manufacturer covered by patient's insurance. [name], MD Taking Active   HYDROcodone-acetaminophen (NORCO/VICODIN) 5-325 MG tablet [id number] Yes Take 1 tablet by mouth every 6 (six) hours as needed for up to 3 days for severe pain. [name], MD Taking Active   Lancet Device MISC [id number] Yes 1 each by Does not apply route in the morning, at noon, and at bedtime. May substitute to any  manufacturer covered by [employer]. [name], MD Taking Active   metroNIDAZOLE (FLAGYL) 500 MG tablet [id number] Yes Take 1 tablet (500 mg total) by mouth 2 (two) times daily for 11 days. [name], MD Taking Active   ondansetron (ZOFRAN ODT) 4 MG disintegrating tablet [id number] Yes Take 1 tablet (4 mg total)  by mouth every 8 (eight) hours as needed for nausea or vomiting. Weber Cooks, NP Taking Active Self  polyethylene glycol (MIRALAX / GLYCOLAX) 17 g packet [id number] Yes Take 17 g by mouth daily. [name], MD Taking Active   rosuvastatin (CRESTOR) 5 MG tablet [id number] Yes Take 1 tablet (5 mg total) by mouth daily. Weber Cooks, NP Taking Active Self  tirzepatide [facility]) 2.5 MG/0.5ML Pen [id number] Yes Inject 2.5 mg into the skin once a week. [name], MD Taking Active   traZODone (DESYREL) 50 MG tablet [id number] Yes Take 0.5 tablets (25 mg total) by mouth at bedtime as needed for sleep. [name], MD Taking Active             Home Care and Equipment/Supplies: Were Home Health Services Ordered?: Yes Name of Home Health Agency:: [facility] Has Agency set up a time to come to your home?: Yes First Home Health Visit Date: 01/07/23 Any new equipment or medical supplies ordered?: NA  Functional Questionnaire: Do you need assistance with bathing/showering or dressing?: No Do you need assistance with meal preparation?: Yes Do you need assistance with eating?: No Do you have difficulty maintaining continence: No Do you need assistance with getting out of bed/getting out of a chair/moving?: No Do you have difficulty managing or taking your medications?: No  Follow up appointments reviewed: PCP Follow-up appointment confirmed?: NA Specialist Hospital Follow-up appointment confirmed?: Yes Date of Specialist follow-up appointment?: 01/16/23 Follow-Up Specialty Provider:: Infection control Do you need transportation to your follow-up appointment?: No Do you understand care options if your condition(s) worsen?: Yes-patient verbalized understanding  SDOH Interventions Today    Flowsheet Row Most Recent Value  SDOH Interventions   Food Insecurity Interventions Intervention Not Indicated  Transportation Interventions Intervention Not  Indicated, Patient Resources (Friends/Family)      Interventions Today    Flowsheet Row Most Recent Value  General Interventions   General Interventions Discussed/Reviewed General Interventions Discussed, General Interventions Reviewed, Doctor Visits, Communication with  [RN spoke with nurse drawing labs at patient home. RN gave her information for where to send results and number to call for questions.]  Doctor Visits Discussed/Reviewed Doctor Visits Discussed, Doctor Visits Reviewed  [facility] gave patient name of plastic surgeon PA and Urology PA .]  Pharmacy Interventions   Pharmacy Dicussed/Reviewed Pharmacy Topics Discussed, Pharmacy Topics Reviewed  [RN disussed how to trouble shoot free style]      TOC Interventions Today    Flowsheet Row Most Recent Value  TOC Interventions   TOC Interventions Discussed/Reviewed TOC Interventions Discussed, TOC Interventions Reviewed        Gean Maidens BSN RN [facility] [phone]

## 2023-01-07 NOTE — Telephone Encounter (Signed)
Received call from Ermalene Searing, Rn stating there is drug drug interaction between Fluconazole and trazadone/ tylenol. Trazadone and Tylenol are PRN, but would like to inform provider.  Will route message to Dr. Rivka Safer and Wilford Sports, Pharmacist so they are aware.  [phone] Juanita Laster, RMA

## 2023-01-08 ENCOUNTER — Other Ambulatory Visit: Payer: Self-pay | Admitting: Physician Assistant

## 2023-01-08 DIAGNOSIS — A419 Sepsis, unspecified organism: Secondary | ICD-10-CM | POA: Diagnosis not present

## 2023-01-08 DIAGNOSIS — I700 Atherosclerosis of aorta: Secondary | ICD-10-CM | POA: Diagnosis not present

## 2023-01-08 DIAGNOSIS — E1165 Type 2 diabetes mellitus with hyperglycemia: Secondary | ICD-10-CM | POA: Diagnosis not present

## 2023-01-08 DIAGNOSIS — E7030 Albinism, unspecified: Secondary | ICD-10-CM | POA: Diagnosis not present

## 2023-01-08 DIAGNOSIS — E785 Hyperlipidemia, unspecified: Secondary | ICD-10-CM | POA: Diagnosis not present

## 2023-01-08 DIAGNOSIS — I10 Essential (primary) hypertension: Secondary | ICD-10-CM | POA: Diagnosis not present

## 2023-01-08 DIAGNOSIS — Z792 Long term (current) use of antibiotics: Secondary | ICD-10-CM | POA: Diagnosis not present

## 2023-01-08 DIAGNOSIS — N493 Fournier gangrene: Secondary | ICD-10-CM

## 2023-01-08 DIAGNOSIS — Z5181 Encounter for therapeutic drug level monitoring: Secondary | ICD-10-CM | POA: Diagnosis not present

## 2023-01-08 NOTE — Telephone Encounter (Signed)
He is on a very low dose of trazodone and it is as needed, so I am not worried about the drug interaction with fluconazole. There is no drug interaction with Tylenol. Ok to continue all three medications. Thanks!

## 2023-01-09 DIAGNOSIS — Z792 Long term (current) use of antibiotics: Secondary | ICD-10-CM | POA: Diagnosis not present

## 2023-01-09 DIAGNOSIS — A419 Sepsis, unspecified organism: Secondary | ICD-10-CM | POA: Diagnosis not present

## 2023-01-09 DIAGNOSIS — E7030 Albinism, unspecified: Secondary | ICD-10-CM | POA: Diagnosis not present

## 2023-01-09 DIAGNOSIS — E785 Hyperlipidemia, unspecified: Secondary | ICD-10-CM | POA: Diagnosis not present

## 2023-01-09 DIAGNOSIS — E1165 Type 2 diabetes mellitus with hyperglycemia: Secondary | ICD-10-CM | POA: Diagnosis not present

## 2023-01-09 DIAGNOSIS — Z5181 Encounter for therapeutic drug level monitoring: Secondary | ICD-10-CM | POA: Diagnosis not present

## 2023-01-09 DIAGNOSIS — I700 Atherosclerosis of aorta: Secondary | ICD-10-CM | POA: Diagnosis not present

## 2023-01-09 DIAGNOSIS — I10 Essential (primary) hypertension: Secondary | ICD-10-CM | POA: Diagnosis not present

## 2023-01-11 DIAGNOSIS — A419 Sepsis, unspecified organism: Secondary | ICD-10-CM | POA: Diagnosis not present

## 2023-01-11 DIAGNOSIS — E7030 Albinism, unspecified: Secondary | ICD-10-CM | POA: Diagnosis not present

## 2023-01-11 DIAGNOSIS — E785 Hyperlipidemia, unspecified: Secondary | ICD-10-CM | POA: Diagnosis not present

## 2023-01-11 DIAGNOSIS — E1165 Type 2 diabetes mellitus with hyperglycemia: Secondary | ICD-10-CM | POA: Diagnosis not present

## 2023-01-11 DIAGNOSIS — I700 Atherosclerosis of aorta: Secondary | ICD-10-CM | POA: Diagnosis not present

## 2023-01-11 DIAGNOSIS — I10 Essential (primary) hypertension: Secondary | ICD-10-CM | POA: Diagnosis not present

## 2023-01-11 DIAGNOSIS — Z5181 Encounter for therapeutic drug level monitoring: Secondary | ICD-10-CM | POA: Diagnosis not present

## 2023-01-11 DIAGNOSIS — Z792 Long term (current) use of antibiotics: Secondary | ICD-10-CM | POA: Diagnosis not present

## 2023-01-13 ENCOUNTER — Encounter: Payer: 59 | Attending: Physician Assistant | Admitting: Physician Assistant

## 2023-01-13 DIAGNOSIS — Z792 Long term (current) use of antibiotics: Secondary | ICD-10-CM | POA: Diagnosis not present

## 2023-01-13 DIAGNOSIS — N493 Fournier gangrene: Secondary | ICD-10-CM | POA: Diagnosis present

## 2023-01-13 DIAGNOSIS — T8189XA Other complications of procedures, not elsewhere classified, initial encounter: Secondary | ICD-10-CM | POA: Diagnosis not present

## 2023-01-13 DIAGNOSIS — E7030 Albinism, unspecified: Secondary | ICD-10-CM | POA: Diagnosis not present

## 2023-01-13 DIAGNOSIS — I10 Essential (primary) hypertension: Secondary | ICD-10-CM | POA: Insufficient documentation

## 2023-01-13 DIAGNOSIS — E785 Hyperlipidemia, unspecified: Secondary | ICD-10-CM | POA: Diagnosis not present

## 2023-01-13 DIAGNOSIS — E1165 Type 2 diabetes mellitus with hyperglycemia: Secondary | ICD-10-CM | POA: Diagnosis not present

## 2023-01-13 DIAGNOSIS — E11622 Type 2 diabetes mellitus with other skin ulcer: Secondary | ICD-10-CM | POA: Diagnosis not present

## 2023-01-13 DIAGNOSIS — L98498 Non-pressure chronic ulcer of skin of other sites with other specified severity: Secondary | ICD-10-CM | POA: Insufficient documentation

## 2023-01-13 DIAGNOSIS — F17200 Nicotine dependence, unspecified, uncomplicated: Secondary | ICD-10-CM | POA: Diagnosis not present

## 2023-01-13 DIAGNOSIS — Z794 Long term (current) use of insulin: Secondary | ICD-10-CM | POA: Diagnosis not present

## 2023-01-13 DIAGNOSIS — A419 Sepsis, unspecified organism: Secondary | ICD-10-CM | POA: Diagnosis not present

## 2023-01-13 DIAGNOSIS — Z5181 Encounter for therapeutic drug level monitoring: Secondary | ICD-10-CM | POA: Diagnosis not present

## 2023-01-13 DIAGNOSIS — I700 Atherosclerosis of aorta: Secondary | ICD-10-CM | POA: Diagnosis not present

## 2023-01-14 DIAGNOSIS — Z792 Long term (current) use of antibiotics: Secondary | ICD-10-CM | POA: Diagnosis not present

## 2023-01-14 DIAGNOSIS — A419 Sepsis, unspecified organism: Secondary | ICD-10-CM | POA: Diagnosis not present

## 2023-01-14 DIAGNOSIS — E1165 Type 2 diabetes mellitus with hyperglycemia: Secondary | ICD-10-CM | POA: Diagnosis not present

## 2023-01-14 DIAGNOSIS — E785 Hyperlipidemia, unspecified: Secondary | ICD-10-CM | POA: Diagnosis not present

## 2023-01-14 DIAGNOSIS — Z5181 Encounter for therapeutic drug level monitoring: Secondary | ICD-10-CM | POA: Diagnosis not present

## 2023-01-14 DIAGNOSIS — I10 Essential (primary) hypertension: Secondary | ICD-10-CM | POA: Diagnosis not present

## 2023-01-14 DIAGNOSIS — E7030 Albinism, unspecified: Secondary | ICD-10-CM | POA: Diagnosis not present

## 2023-01-14 DIAGNOSIS — I700 Atherosclerosis of aorta: Secondary | ICD-10-CM | POA: Diagnosis not present

## 2023-01-15 DIAGNOSIS — I700 Atherosclerosis of aorta: Secondary | ICD-10-CM | POA: Diagnosis not present

## 2023-01-15 DIAGNOSIS — Z5181 Encounter for therapeutic drug level monitoring: Secondary | ICD-10-CM | POA: Diagnosis not present

## 2023-01-15 DIAGNOSIS — Z792 Long term (current) use of antibiotics: Secondary | ICD-10-CM | POA: Diagnosis not present

## 2023-01-15 DIAGNOSIS — A419 Sepsis, unspecified organism: Secondary | ICD-10-CM | POA: Diagnosis not present

## 2023-01-15 DIAGNOSIS — I10 Essential (primary) hypertension: Secondary | ICD-10-CM | POA: Diagnosis not present

## 2023-01-15 DIAGNOSIS — E7030 Albinism, unspecified: Secondary | ICD-10-CM | POA: Diagnosis not present

## 2023-01-15 DIAGNOSIS — E1165 Type 2 diabetes mellitus with hyperglycemia: Secondary | ICD-10-CM | POA: Diagnosis not present

## 2023-01-15 DIAGNOSIS — E785 Hyperlipidemia, unspecified: Secondary | ICD-10-CM | POA: Diagnosis not present

## 2023-01-16 ENCOUNTER — Telehealth: Payer: Self-pay

## 2023-01-16 ENCOUNTER — Ambulatory Visit: Payer: 59 | Attending: Infectious Diseases | Admitting: Infectious Diseases

## 2023-01-16 ENCOUNTER — Encounter: Payer: Self-pay | Admitting: Infectious Diseases

## 2023-01-16 VITALS — BP 137/70 | HR 101 | Temp 97.1°F | Ht 73.0 in | Wt 215.0 lb

## 2023-01-16 DIAGNOSIS — N493 Fournier gangrene: Secondary | ICD-10-CM | POA: Insufficient documentation

## 2023-01-16 DIAGNOSIS — Z5986 Financial insecurity: Secondary | ICD-10-CM | POA: Diagnosis not present

## 2023-01-16 DIAGNOSIS — L304 Erythema intertrigo: Secondary | ICD-10-CM | POA: Diagnosis not present

## 2023-01-16 DIAGNOSIS — Z794 Long term (current) use of insulin: Secondary | ICD-10-CM | POA: Insufficient documentation

## 2023-01-16 DIAGNOSIS — E1152 Type 2 diabetes mellitus with diabetic peripheral angiopathy with gangrene: Secondary | ICD-10-CM | POA: Insufficient documentation

## 2023-01-16 DIAGNOSIS — Z833 Family history of diabetes mellitus: Secondary | ICD-10-CM | POA: Diagnosis not present

## 2023-01-16 MED ORDER — METRONIDAZOLE 500 MG PO TABS: 500.0000 mg | ORAL_TABLET | Freq: Two times a day (BID) | ORAL | 0 refills | Status: DC

## 2023-01-16 MED ORDER — CLOTRIMAZOLE 1 % EX OINT: 0.5000 [in_us] | TOPICAL_OINTMENT | Freq: Two times a day (BID) | CUTANEOUS | 0 refills | Status: DC

## 2023-01-16 NOTE — Telephone Encounter (Signed)
Patient's IV abx have been extended to 01/22/23  per Dr. Rivka Safer. Patient can have picc line removed on 01/23/23. Patient will see plastic surgery on 01/23/23. Patient aware of appointment information and verbalized understanding.   I have updated [employer] and our pharmacy team with orders.  Steve [name]

## 2023-01-16 NOTE — Progress Notes (Signed)
NAME: Steve [name]  DOB: August 23, 1980  MRN: [id number]  Date/Time: 01/16/2023 10:47 AM   Subjective:   Follow up fournier's gangrene? Steve [name] is a 42 y.o. male with a history of DM was in [facility] between 12/28/22-01/06/23 for infected scrotal tissue , fournier's gangrene and underwent debridement on 12/29/22 Culture sent on 7/29 was ecoli, candida As the ecoli was resisatnt to oral antibiotics he was sent on IV ceftriaxone+ Po flagyl + pol fluconazole for 2 weeks He was to see plastic surgery as Op but it has not happened yet He is doing better, he does dressing every day and his friend helps with Iv  antibiotic  Past Medical History:  Diagnosis Date   Diabetes mellitus without complication (HCC)    GERD (gastroesophageal reflux disease)    History of gastritis 09/2016   Hypertension     Past Surgical History:  Procedure Laterality Date   COLONOSCOPY WITH PROPOFOL N/A 11/15/2019   Procedure: COLONOSCOPY WITH PROPOFOL WITH POLYPECTOMY, BIOPSIES;  Surgeon: Midge Minium, MD;  Location: [facility];  Service: Endoscopy;  Laterality: N/A;   ESOPHAGOGASTRODUODENOSCOPY (EGD) WITH PROPOFOL N/A 11/15/2019   Procedure: ESOPHAGOGASTRODUODENOSCOPY (EGD) WITH PROPOFOL WITH POLYPECTOMY, BIOPSIES;  Surgeon: Midge Minium, MD;  Location: [facility];  Service: Endoscopy;  Laterality: N/A;   INCISION AND DRAINAGE ABSCESS N/A 12/29/2022   Procedure: INCISION AND DEBRIDEMENT;  Surgeon: Crista Elliot, MD;  Location: [facility];  Service: Urology;  Laterality: N/A;   TOOTH EXTRACTION      Social History   Socioeconomic History   Marital status: Single    Spouse name: Not on file   Number of children: Not on file   Years of education: Not on file   Highest education level: Not on file  Occupational History   Not on file  Tobacco Use   Smoking status: Every Day    Current packs/day: 0.25    Average packs/day: 0.3 packs/day for 23.0 years (5.8 ttl pk-yrs)    Types: Cigarettes    Smokeless tobacco: Never   Tobacco comments:    since age 18  Vaping Use   Vaping status: Every Day  Substance and Sexual Activity   Alcohol use: No   Drug use: Yes    Types: Marijuana   Sexual activity: Yes  Other Topics Concern   Not on file  Social History Narrative   Not on file   Social Determinants of Health   Financial Resource Strain: Medium Risk (12/02/2022)   Overall Financial Resource Strain (CARDIA)    Difficulty of Paying Living Expenses: Somewhat hard  Food Insecurity: No Food Insecurity (01/07/2023)   Hunger Vital Sign    Worried About Running Out of Food in the Last Year: Never true    Ran Out of Food in the Last Year: Never true  Transportation Needs: No Transportation Needs (01/07/2023)   PRAPARE - Administrator, Civil Service (Medical): No    Lack of Transportation (Non-Medical): No  Physical Activity: Sufficiently Active (12/02/2022)   Exercise Vital Sign    Days of Exercise per Week: 3 days    Minutes of Exercise per Session: 60 min  Stress: No Stress Concern Present (12/02/2022)   [employer] of Occupational Health - Occupational Stress Questionnaire    Feeling of Stress : Not at all  Social Connections: Socially Isolated (12/02/2022)   Social Connection and Isolation Panel [NHANES]    Frequency of Communication with Friends and Family: Once a week  Frequency of Social Gatherings with Friends and Family: Never    Attends Religious Services: Never    Database administrator or Organizations: Yes    Attends Banker Meetings: Never    Marital Status: Never married  Intimate Partner Violence: Not At Risk (12/29/2022)   Humiliation, Afraid, Rape, and Kick questionnaire    Fear of Current or Ex-Partner: No    Emotionally Abused: No    Physically Abused: No    Sexually Abused: No    Family History  Problem Relation Age of Onset   Diabetes Brother    Hypertension Brother    Diabetes Maternal Aunt    Hypertension Maternal Aunt     Cancer Maternal Aunt        pancreas   Diabetes Maternal Uncle    Hypertension Maternal Uncle    Cancer Maternal Uncle        bone   Diabetes Maternal Grandmother    Cancer Maternal Grandmother    Diabetes Maternal Grandfather    Cancer Maternal Uncle        lung   COPD Neg Hx    Heart disease Neg Hx    Stroke Neg Hx    No Known Allergies I? Current Outpatient Medications  Medication Sig Dispense Refill   acetaminophen (TYLENOL) 325 MG tablet Take 2 tablets (650 mg total) by mouth every 6 (six) hours as needed for mild pain (or Fever >/= 101). 20 tablet 0   amLODipine (NORVASC) 5 MG tablet Take 1 tablet (5 mg total) by mouth daily. 30 tablet 2   Blood Glucose Monitoring Suppl DEVI 1 each by Does not apply route in the morning, at noon, and at bedtime. May substitute to any manufacturer covered by patient's insurance. 1 each 0   cefTRIAXone (ROCEPHIN) IVPB Inject 2 g into the vein daily for 10 days. Indication:  Fourniers gangrene First Dose: Yes Last Day of Therapy: 01/17/23 Labs - Once weekly:  CBC/D and CMP Method of administration: IV Push Method of administration may be changed at the discretion of home infusion pharmacist based upon assessment of the patient and/or caregiver's ability to self-administer the medication ordered. Fax weekly lab results promptly to [phone] Call [phone] with any questions Please pull PIC at completion of IV antibiotics 11 Units 0   dexlansoprazole (DEXILANT) 60 MG capsule TAKE 1 CAPSULE BY MOUTH EVERY DAY 90 capsule 2   fluconazole (DIFLUCAN) 200 MG tablet Take 2 tablets (400 mg total) by mouth daily for 10 days. 20 tablet 0   Glucose Blood (BLOOD GLUCOSE TEST STRIPS) STRP 1 each by In Vitro route in the morning, at noon, and at bedtime. May substitute to any manufacturer covered by patient's insurance. 100 strip 0   Lancet Device MISC 1 each by Does not apply route in the morning, at noon, and at bedtime. May substitute to any  manufacturer covered by patient's insurance. 1 each 0   metroNIDAZOLE (FLAGYL) 500 MG tablet Take 1 tablet (500 mg total) by mouth 2 (two) times daily for 11 days. 22 tablet 0   ondansetron (ZOFRAN ODT) 4 MG disintegrating tablet Take 1 tablet (4 mg total) by mouth every 8 (eight) hours as needed for nausea or vomiting. 20 tablet 0   polyethylene glycol (MIRALAX / GLYCOLAX) 17 g packet Take 17 g by mouth daily. 14 each 0   rosuvastatin (CRESTOR) 5 MG tablet Take 1 tablet (5 mg total) by mouth daily. 90 tablet 1   tirzepatide [facility])  2.5 MG/0.5ML Pen Inject 2.5 mg into the skin once a week. 2 mL 0   traZODone (DESYREL) 50 MG tablet Take 0.5 tablets (25 mg total) by mouth at bedtime as needed for sleep. 30 tablet 0   No current facility-administered medications for this visit.     Abtx:  Anti-infectives (From admission, onward)    None       REVIEW OF SYSTEMS:  Const: negative fever, negative chills, negative weight loss Eyes: negative diplopia or visual changes, negative eye pain ENT: negative coryza, negative sore throat Resp: negative cough, hemoptysis, dyspnea Cards: negative for chest pain, palpitations, lower extremity edema GU: scrotal wound Skin: negative for rash and pruritus Heme: negative for easy bruising and gum/nose bleeding MS: negative for myalgias, arthralgias, back pain and muscle weakness Neurolo:negative for headaches, dizziness, vertigo, memory problems  Psych: negative for feelings of anxiety, depression  Endocrine: , diabetes Allergy/Immunology- negative for any medication or food allergies ?  Objective:  VITALS:  BP 137/70   Pulse (!) 101   Temp (!) 97.1 F (36.2 C) (Temporal)   Ht 6\' 1"  (1.854 m)   Wt 215 lb (97.5 kg)   BMI 28.37 kg/m   PHYSICAL EXAM:  General: Alert, cooperative, no distress, appears stated age. albinism Head: Normocephalic, without obvious abnormality, atraumatic. Eyes: Conjunctivae clear, anicteric sclerae. Pupils are  equal ENT Nares normal. No drainage or sinus tenderness. Lips, mucosa, and tongue normal. No Thrush Neck: Supple, symmetrical, no adenopathy, thyroid: non tender no carotid bruit and no JVD. Back: No CVA tenderness. Lungs: Clear to auscultation bilaterally. No Wheezing or Rhonchi. No rales. Heart: Regular rate and rhythm, no murmur, rub or gallop. Abdomen: Soft, non-tender,not distended. Bowel sounds normal. No masses GU- scrotal surgical wound - has 90% healthy tissue- some slough  at the edge   Extremities: atraumatic, no cyanosis. No edema. No clubbing Skin: No rashes or lesions. Or bruising Lymph: Cervical, supraclavicular normal. Neurologic: Grossly non-focal Pertinent Labs Dont have any ? Impression/Recommendation ? ?FOUNIER'S GANGRENE OF SCROTUM S/P EXTENSIVE DEBRIDEMENT On CEFTRIAXONE + FLAGYL- continue for another week as he has no oral option for the [facility] and has some minimal slough, till he sees plastics next week. Completed fluconazole- will prescribe  clotrimazole cream for the intertrigo, not on the debrided wound  DM- on insulin   Coordinated care with urology, wound and plastic surgery and made an appt for him next Thursday as he needs skin graft My nurse informed the home infusion company to continue IV   ? ___________________________________________________ Discussed with patient in detail Discussed with his mother Note:  This document was prepared using Dragon voice recognition software and may include unintentional dictation errors.

## 2023-01-16 NOTE — Progress Notes (Signed)
BARTLOMIEJ, BURRS [name] ([id number]) [id number] Page 1 of 7 Visit Report for 01/13/2023 Chief Complaint Document Details Patient Name: Date of Service: Steve [name] [facility] [name] 01/13/2023 2:30 PM Medical Record Number: [id number] Patient Account Number: [id number] Date of Birth/Sex: Treating RN: 12-27-1980 (42 y.o. [name]) Yevonne Pax Primary Care Provider: Larae Grooms Other Clinician: Referring Provider: Treating Provider/Extender: Steele Sizer Weeks in Treatment: 0 Information Obtained from: Patient Chief Complaint Open surgical wound due to Fournier gangrene Electronic Signature(s) Signed: 01/13/2023 3:22:02 PM By: Allen Derry PA-C Entered By: Allen Derry on 01/13/2023 15:22:02 -------------------------------------------------------------------------------- HPI Details Patient Name: Date of Service: Steve [name] 01/13/2023 2:30 PM Medical Record Number: [id number] Patient Account Number: [id number] Date of Birth/Sex: Treating RN: 02-04-1981 (42 y.o. [name] Primary Care Provider: Larae Grooms Other Clinician: Referring Provider: Treating Provider/Extender: Steele Sizer Weeks in Treatment: 0 History of Present Illness HPI Description: 01-13-2023 upon evaluation today patient presents for a significant issue here regarding an issue where he had a Fournier gangrene which subsequently was manage inpatient. He ended up having to have obviously surgical intervention and unfortunately post intervention he does have now a significant wound which is in the perineal area which actually includes a portion of the scrotum as well that has been actually surgically removed and what appeared. Subsequently the testes are actually exposed to a degree and again this seems to be something that is more of a surgical things than just a wound. Thanks to me. I do not see a way that we can just get this to heal by second  intent. I think that he likely needs to see plastic surgery as soon as possible. I discussed that with the patient today and this is what the surgeon and the PA with the surgeon that I been discussing this case with as well has recommended but apparently plastic surgery wanted the patient seen by wound care first before they would proceed with any type of surgical intervention. The patient is currently on a Dexcom although he previously did have a significant elevation in his blood sugars back in July on the 28 2024 his A1c was 13.5. With that being said that is being worked on trying to get this under control. He does have a history again obviously of diabetes mellitus type 2, hypertension, and more recently the Fournier gangrene. The patient's wound actually started January 03, 2023 as a small abscess that then actually exponentially worsened. Electronic Signature(s) Signed: 01/13/2023 5:47:58 PM By: Allen Derry PA-C Entered By: Allen Derry on 01/13/2023 17:47:58 Steve [name] ([id number]) [id number] Page 2 of 7 -------------------------------------------------------------------------------- Physical Exam Details Patient Name: Date of Service: Steve [name] [facility] [name] 01/13/2023 2:30 PM Medical Record Number: [id number] Patient Account Number: [id number] Date of Birth/Sex: Treating RN: [birth year] (42 y.o. [name] Primary Care Provider: Larae Grooms Other Clinician: Referring Provider: Treating Provider/Extender: Steele Sizer Weeks in Treatment: 0 Constitutional patient is hypertensive.. pulse regular and within target range for patient.Marland Kitchen respirations regular, non-labored and within target range for patient.Marland Kitchen temperature within target range for patient.. Well-nourished and well-hydrated in no acute distress. Eyes conjunctiva clear no eyelid edema noted. pupils equal round and reactive to light and accommodation. Ears, Nose,  Mouth, and Throat no gross abnormality of ear auricles or external auditory canals. normal hearing noted during conversation. mucus membranes moist. Respiratory normal breathing without difficulty. Musculoskeletal normal gait and posture. no significant deformity or arthritic changes, no loss or  range of motion, no clubbing. Psychiatric this patient is able to make decisions and demonstrates good insight into disease process. Alert and Oriented x 3. pleasant and cooperative. Notes Upon inspection patient's wound again is showing signs of a significant opening here where a portion of the scrotal sac is actually missing and subsequently the testes are exposed. Subsequently I think that there is some good granulation this appears to be much better from the standpoint of infection but at the same time I think that this is still something that is going require surgical intervention I do not know that this is going to properly heal just by second intent alone. I discussed that with the patient with that being said get into see an actual plastic surgeon is what is going to be the primary goal at this point. I discussed with the patient that we can definitely try local also think that we could try [facility] as there wound care center actually is staffed by plastic surgeons as well all of which 2 are excellent and there on the right. Electronic Signature(s) Signed: 01/13/2023 5:48:55 PM By: Allen Derry PA-C Entered By: Allen Derry on 01/13/2023 17:48:55 -------------------------------------------------------------------------------- Physician Orders Details Patient Name: Date of Service: Steve [name] 01/13/2023 2:30 PM Medical Record Number: [id number] Patient Account Number: [id number] Date of Birth/Sex: Treating RN: 12-28-1980 (42 y.o. [name] Primary Care Provider: Larae Grooms Other Clinician: Referring Provider: Treating Provider/Extender: Steele Sizer Weeks in Treatment: 0 Steve [name], Steve [name] ([id number]) [id number] Page 3 of 7 Verbal / Phone Orders: No Diagnosis Coding ICD-10 Coding Code Description N49.3 Fournier gangrene L98.498 Non-pressure chronic ulcer of skin of other sites with other specified severity E11.622 Type 2 diabetes mellitus with other skin ulcer I10 Essential (primary) hypertension Follow-up Appointments Return Appointment in 1 week. Bathing/ Shower/ Hygiene Clean wound with Normal Saline or wound cleanser. No tub bath. Wound Treatment Wound #1 - Scrotum Cleanser: Normal Saline 1 x Per Day/30 Days Discharge Instructions: Wash your hands with soap and water. Remove old dressing, discard into plastic bag and place into trash. Cleanse the wound with Normal Saline prior to applying a clean dressing using gauze sponges, not tissues or cotton balls. Do not scrub or use excessive force. Pat dry using gauze sponges, not tissue or cotton balls. Prim Dressing: Gauze 1 x Per Day/30 Days ary Discharge Instructions: moistened with saline Secondary Dressing: ABD Pad 5x9 (in/in) 1 x Per Day/30 Days Discharge Instructions: Cover with ABD pad Electronic Signature(s) Signed: 01/14/2023 4:30:30 PM By: Allen Derry PA-C Signed: 01/16/2023 8:03:28 AM By: Yevonne Pax RN Entered By: Yevonne Pax on 01/13/2023 15:30:13 -------------------------------------------------------------------------------- Problem List Details Patient Name: Date of Service: Steve [name] 01/13/2023 2:30 PM Medical Record Number: [id number] Patient Account Number: [id number] Date of Birth/Sex: Treating RN: 12/27/1980 (41 y.o. [name] Primary Care Provider: Larae Grooms Other Clinician: Referring Provider: Treating Provider/Extender: Steele Sizer Weeks in Treatment: 0 Active Problems ICD-10 Encounter Code Description Active Date MDM Diagnosis N49.3  Fournier gangrene 01/13/2023 No Yes L98.498 Non-pressure chronic ulcer of skin of other sites with other specified severity 01/13/2023 No Yes Steve [name], Steve [name] ([id number]) [id number] Page 4 of 7 E11.622 Type 2 diabetes mellitus with other skin ulcer 01/13/2023 No Yes I10 Essential (primary) hypertension 01/13/2023 No Yes Inactive Problems Resolved Problems Electronic Signature(s) Signed: 01/13/2023 3:14:13 PM By: Allen Derry PA-C Entered By: Allen Derry on 01/13/2023 15:14:13 -------------------------------------------------------------------------------- Progress Note Details Patient Name:  Date of Service: Steve [name] [facility] [name] 01/13/2023 2:30 PM Medical Record Number: [id number] Patient Account Number: [id number] Date of Birth/Sex: Treating RN: 1980/11/14 (42 y.o. [name]) Yevonne Pax Primary Care Provider: Larae Grooms Other Clinician: Referring Provider: Treating Provider/Extender: Steele Sizer Weeks in Treatment: 0 Subjective Chief Complaint Information obtained from Patient Open surgical wound due to Fournier gangrene History of Present Illness (HPI) 01-13-2023 upon evaluation today patient presents for a significant issue here regarding an issue where he had a Fournier gangrene which subsequently was manage inpatient. He ended up having to have obviously surgical intervention and unfortunately post intervention he does have now a significant wound which is in the perineal area which actually includes a portion of the scrotum as well that has been actually surgically removed and what appeared. Subsequently the testes are actually exposed to a degree and again this seems to be something that is more of a surgical things than just a wound. Thanks to me. I do not see a way that we can just get this to heal by second intent. I think that he likely needs to see plastic surgery as soon as possible. I discussed that with the patient today and  this is what the surgeon and the PA with the surgeon that I been discussing this case with as well has recommended but apparently plastic surgery wanted the patient seen by wound care first before they would proceed with any type of surgical intervention. The patient is currently on a Dexcom although he previously did have a significant elevation in his blood sugars back in July on the 28 2024 his A1c was 13.5. With that being said that is being worked on trying to get this under control. He does have a history again obviously of diabetes mellitus type 2, hypertension, and more recently the Fournier gangrene. The patient's wound actually started January 03, 2023 as a small abscess that then actually exponentially worsened. Patient History Allergies No Known Allergies Social History Current every day smoker, Marital Status - Single, Alcohol Use - Never, Drug Use - No History, Caffeine Use - Moderate. Medical History Cardiovascular Patient has history of Hypertension Endocrine Patient has history of Type II Diabetes Patient is treated with Insulin. Blood sugar is not tested. Review of Systems (ROS) Integumentary (Skin) Complains or has symptoms of Wounds. Steve [name], Steve [name] ([id number]) [id number] Page 5 of 7 Objective Constitutional patient is hypertensive.. pulse regular and within target range for patient.Marland Kitchen respirations regular, non-labored and within target range for patient.Marland Kitchen temperature within target range for patient.. Well-nourished and well-hydrated in no acute distress. Vitals Time Taken: 2:41 PM, Height: 74 in, Source: Stated, Weight: 230 lbs, Source: Stated, BMI: 29.5, Temperature: 97.7 F, Pulse: 89 bpm, Respiratory Rate: 18 breaths/min, Blood Pressure: 161/106 mmHg. Eyes conjunctiva clear no eyelid edema noted. pupils equal round and reactive to light and accommodation. Ears, Nose, Mouth, and Throat no gross abnormality of ear auricles or external  auditory canals. normal hearing noted during conversation. mucus membranes moist. Respiratory normal breathing without difficulty. Musculoskeletal normal gait and posture. no significant deformity or arthritic changes, no loss or range of motion, no clubbing. Psychiatric this patient is able to make decisions and demonstrates good insight into disease process. Alert and Oriented x 3. pleasant and cooperative. General Notes: Upon inspection patient's wound again is showing signs of a significant opening here where a portion of the scrotal sac is actually missing and subsequently the testes are exposed. Subsequently I think that there is  some good granulation this appears to be much better from the standpoint of infection but at the same time I think that this is still something that is going require surgical intervention I do not know that this is going to properly heal just by second intent alone. I discussed that with the patient with that being said get into see an actual plastic surgeon is what is going to be the primary goal at this point. I discussed with the patient that we can definitely try local also think that we could try [facility] as there wound care center actually is staffed by plastic surgeons as well all of which 2 are excellent and there on the right. Integumentary (Hair, Skin) Wound #1 status is Open. Original cause of wound was Gradually Appeared. The date acquired was: 01/03/2023. The wound is located on the Scrotum. The wound measures 11cm length x 8cm width x 6.5cm depth; 69.115cm^2 area and 449.248cm^3 volume. There is Fat Layer (Subcutaneous Tissue) exposed. There is no tunneling or undermining noted. There is a medium amount of serosanguineous drainage noted. There is large (67-100%) red granulation within the wound bed. There is a small (1-33%) amount of necrotic tissue within the wound bed including Adherent Slough. Assessment Active Problems ICD-10 Fournier  gangrene Non-pressure chronic ulcer of skin of other sites with other specified severity Type 2 diabetes mellitus with other skin ulcer Essential (primary) hypertension Plan Follow-up Appointments: Return Appointment in 1 week. Bathing/ Shower/ Hygiene: Clean wound with Normal Saline or wound cleanser. No tub bath. WOUND #1: - Scrotum Wound Laterality: Cleanser: Normal Saline 1 x Per Day/30 Days Discharge Instructions: Wash your hands with soap and water. Remove old dressing, discard into plastic bag and place into trash. Cleanse the wound with Normal Saline prior to applying a clean dressing using gauze sponges, not tissues or cotton balls. Do not scrub or use excessive force. Pat dry using gauze sponges, not tissue or cotton balls. Prim Dressing: Gauze 1 x Per Day/30 Days ary Discharge Instructions: moistened with saline Secondary Dressing: ABD Pad 5x9 (in/in) 1 x Per Day/30 Days Discharge Instructions: Cover with ABD pad 1. The patient is primarily hoping for plastic surgery to try to get this closed as quickly as possible. Obviously with this being open it does pose a significant risk from the standpoint of developing infection which is one of the primary concerns here. 2. I am going to suggest as well that the patient should continue right now with the wet-to-dry dressings I do not know that this can be much more that we can do this could work significantly better. 3. I am going to recommend that the ABD pad to cover the wet to dry be put in and then this is being held up with his underclothes. Steve [name], Steve [name] ([id number]) [id number] Page 6 of 7 4. We are not going to try to get him into a plastic surgeon as quickly as possible I been in touch with the referring PA from the urologist as well and subsequently their recommendation has been to have the patient seen by plastic surgery along they are in agreement with this plan. We will see patient back for  reevaluation in 1 week here in the clinic. If anything worsens or changes patient will contact our office for additional recommendations. Electronic Signature(s) Signed: 01/13/2023 5:49:57 PM By: Allen Derry PA-C Entered By: Allen Derry on 01/13/2023 17:49:57 -------------------------------------------------------------------------------- ROS/PFSH Details Patient Name: Date of Service: Steve [name], Steve [name] 01/13/2023  2:30 PM Medical Record Number: [id number] Patient Account Number: [id number] Date of Birth/Sex: Treating RN: 1981-05-13 (42 y.o. [name]) Yevonne Pax Primary Care Provider: Larae Grooms Other Clinician: Referring Provider: Treating Provider/Extender: Steele Sizer Weeks in Treatment: 0 Integumentary (Skin) Complaints and Symptoms: Positive for: Wounds Cardiovascular Medical History: Positive for: Hypertension Endocrine Medical History: Positive for: Type II Diabetes Treated with: Insulin Blood sugar tested every day: No Immunizations Pneumococcal Vaccine: Received Pneumococcal Vaccination: No Implantable Devices None Family and Social History Current every day smoker; Marital Status - Single; Alcohol Use: Never; Drug Use: No History; Caffeine Use: Moderate Electronic Signature(s) Signed: 01/14/2023 4:30:30 PM By: Allen Derry PA-C Signed: 01/16/2023 8:03:28 AM By: Yevonne Pax RN Entered By: Yevonne Pax on 01/13/2023 14:44:21 Steve [name] ([id number]) [id number] Page 7 of 7 -------------------------------------------------------------------------------- SuperBill Details Patient Name: Date of Service: Pollyann Savoy 01/13/2023 Medical Record Number: [id number] Patient Account Number: [id number] Date of Birth/Sex: Treating RN: [birth year] (42 y.o. [name]) Yevonne Pax Primary Care Provider: Larae Grooms Other Clinician: Referring Provider: Treating Provider/Extender: Steele Sizer Weeks in Treatment: 0 Diagnosis Coding ICD-10 Codes Code Description N49.3 Fournier gangrene [id number] Non-pressure chronic ulcer of skin of other sites with other specified severity E11.622 Type 2 diabetes mellitus with other skin ulcer I10 Essential (primary) hypertension Facility Procedures : CPT4 Code: [id number] Description: 99213 - WOUND CARE VISIT-LEV 3 EST PT Modifier: Quantity: 1 Physician Procedures : CPT4 Code Description Modifier [id number] - WC PHYS LEVEL 4 - NEW PT ICD-10 Diagnosis Description N49.3 Fournier gangrene L98.498 Non-pressure chronic ulcer of skin of other sites with other specified severity E11.622 Type 2 diabetes mellitus with  other skin ulcer I10 Essential (primary) hypertension Quantity: 1 Electronic Signature(s) Signed: 01/15/2023 3:29:58 PM By: Yevonne Pax RN Signed: 01/15/2023 5:04:56 PM By: Allen Derry PA-C Previous Signature: 01/13/2023 5:50:45 PM Version By: Allen Derry PA-C Entered By: Yevonne Pax on 01/15/2023 15:29:58

## 2023-01-16 NOTE — Patient Instructions (Addendum)
You are here for the scrotal infection- you are currently on IV ceftriaxone. I will continue for a few more days as you dont have a pill option. Follow up 1 week

## 2023-01-16 NOTE — Progress Notes (Signed)
Steve [name] ([id number]) [id number] Page 1 of 9 Visit Report for 01/13/2023 Allergy List Details Patient Name: Date of Service: Steve [name] [facility] [name] 01/13/2023 2:30 PM Medical Record Number: [id number] Patient Account Number: [id number] Date of Birth/Sex: Treating RN: 10/14/1980 (42 y.o. Steve [name]) Steve [name] Primary Care Steve [name]: Steve [name] Other Clinician: Referring Steve [name]: Treating Steve [name]/Extender: Steve [name] Steve [name]: 0 Allergies Active Allergies No Known Allergies Allergy Notes Electronic Signature(s) Signed: 01/16/2023 8:03:28 AM By: Steve Pax RN Entered By: Steve [name] on 01/13/2023 14:42:33 -------------------------------------------------------------------------------- Arrival Information Details Patient Name: Date of Service: Steve [name] 01/13/2023 2:30 PM Medical Record Number: [id number] Patient Account Number: [id number] Date of Birth/Sex: Treating RN: 08/30/1980 (42 y.o. Steve [name]: Steve [name] Other Clinician: Referring Steve [name]: Treating Steve [name]/Extender: Steve [name] Steve [name]: 0 Visit Information Patient Arrived: Ambulatory Arrival Time: 14:40 Accompanied By: self Transfer Assistance: None Patient Identification Verified: Yes Secondary Verification Process Completed: Yes Patient Requires Transmission-Based Precautions: No Patient Has Alerts: No Electronic Signature(s) Signed: 01/16/2023 8:03:28 AM By: Steve Pax RN Entered By: Steve [name] on 01/13/2023 14:40:44 Steve [name] ([id number]) [id number] Page 2 of 9 -------------------------------------------------------------------------------- Clinic Level of Care Assessment Details Patient Name: Date of Service: Steve [name] [facility] [name] 01/13/2023 2:30 PM Medical Record Number: [id number] Patient Account Number: [id number] Date  of Birth/Sex: Treating RN: 06-Jun-1980 (42 y.o. Steve [name]) Steve [name] Primary Care Ruslan Mccabe: Steve [name] Other Clinician: Referring Ketrina Boateng: Treating Steve [name]/Extender: Steve [name] Steve [name]: 0 Clinic Level of Care Assessment Items TOOL 2 Quantity Score X- 1 0 Use when only an EandM is performed on the INITIAL visit ASSESSMENTS - Nursing Assessment / Reassessment X- 1 20 General Physical Exam (combine w/ comprehensive assessment (listed just below) when performed on new pt. evals) X- 1 25 Comprehensive Assessment (HX, ROS, Risk Assessments, Wounds Hx, etc.) ASSESSMENTS - Wound and Skin A ssessment / Reassessment X - Simple Wound Assessment / Reassessment - one wound 1 5 []  - 0 Complex Wound Assessment / Reassessment - multiple wounds []  - 0 Dermatologic / Skin Assessment (not related to wound area) ASSESSMENTS - Ostomy and/or Continence Assessment and Care []  - 0 Incontinence Assessment and Management []  - 0 Ostomy Care Assessment and Management (repouching, etc.) PROCESS - Coordination of Care X - Simple Patient / Family Education for ongoing care 1 15 []  - 0 Complex (extensive) Patient / Family Education for ongoing care []  - 0 Staff obtains Chiropractor, Records, Steve [name] []  - 0 Staff telephones HHA, Nursing Homes / Clarify orders / etc []  - 0 Routine Transfer to another Facility (non-emergent condition) []  - 0 Routine Hospital Admission (non-emergent condition) X- 1 15 New Admissions / Manufacturing engineer / Ordering NPWT Apligraf, etc. , []  - 0 Emergency Hospital Admission (emergent condition) X- 1 10 Simple Discharge Coordination []  - 0 Complex (extensive) Discharge Coordination PROCESS - Special Needs []  - 0 Pediatric / Minor Patient Management []  - 0 Isolation Patient Management []  - 0 Hearing / Language / Visual special needs []  - 0 Assessment of Community assistance (transportation, D/C planning,  etc.) []  - 0 Additional assistance / Altered mentation []  - 0 Support Surface(s) Assessment (bed, cushion, seat, etc.) INTERVENTIONS - Wound Cleansing / Measurement X- 1 5 Wound Imaging (photographs - any number of wounds) Steve [name], Steve [name] ([id number]) [phone].pdf Page 3 of 9 []  - 0 Wound Tracing (instead of photographs) X- 1 5  Simple Wound Measurement - one wound []  - 0 Complex Wound Measurement - multiple wounds X- 1 5 Simple Wound Cleansing - one wound []  - 0 Complex Wound Cleansing - multiple wounds INTERVENTIONS - Wound Dressings X - Small Wound Dressing one or multiple wounds 1 10 []  - 0 Medium Wound Dressing one or multiple wounds []  - 0 Large Wound Dressing one or multiple wounds []  - 0 Application of Medications - injection INTERVENTIONS - Miscellaneous []  - 0 External ear exam []  - 0 Specimen Collection (cultures, biopsies, blood, body fluids, etc.) []  - 0 Specimen(s) / Culture(s) sent or taken to Lab for analysis []  - 0 Patient Transfer (multiple staff / Nurse, adult / Similar devices) []  - 0 Simple Staple / Suture removal (25 or less) []  - 0 Complex Staple / Suture removal (26 or more) []  - 0 Hypo / Hyperglycemic Management (close monitor of Blood Glucose) []  - 0 Ankle / Brachial Index (ABI) - do not check if billed separately Has the patient been seen at the hospital within the last three years: Yes Total Score: 115 Level Of Care: New/Established - Level 3 Electronic Signature(s) Signed: 01/16/2023 8:03:28 AM By: Steve Pax RN Entered By: Steve [name] on 01/15/2023 15:29:40 -------------------------------------------------------------------------------- Encounter Discharge Information Details Patient Name: Date of Service: Steve [name] 01/13/2023 2:30 PM Medical Record Number: [id number] Patient Account Number: [id number] Date of Birth/Sex: Treating RN: 09-Oct-1980 (42 y.o. Steve [name] Primary Care Steve [name]:  Steve [name] Other Clinician: Referring Steve [name]: Treating Steve [name]/Extender: Steve [name] Steve [name]: 0 Encounter Discharge Information Items Discharge Condition: Stable Ambulatory Status: Ambulatory Discharge Destination: Home Transportation: Private Auto Accompanied By: self Schedule Follow-up Appointment: Yes Clinical Summary of Care: Electronic Signature(s) Signed: 01/15/2023 3:30:44 PM By: Steve Pax RN Steve [name], Steve [name] ([id number]) By: Steve Pax RN [phone].pdf Page 4 of 9 Signed: 01/15/2023 3:30:44 PM Entered By: Steve [name] on 01/15/2023 15:30:44 -------------------------------------------------------------------------------- Lower Extremity Assessment Details Patient Name: Date of Service: Steve [name] [facility] [name] 01/13/2023 2:30 PM Medical Record Number: [id number] Patient Account Number: [id number] Date of Birth/Sex: Treating RN: 10-30-1980 (42 y.o. Steve [name]) Steve [name] Primary Care Greg Cratty: Steve [name] Other Clinician: Referring Daena Alper: Treating Somaly Marteney/Extender: Steve [name] Steve [name]: 0 Electronic Signature(s) Signed: 01/16/2023 8:03:28 AM By: Steve Pax RN Entered By: Steve [name] on 01/13/2023 14:56:51 -------------------------------------------------------------------------------- Multi Wound Chart Details Patient Name: Date of Service: Steve [name] 01/13/2023 2:30 PM Medical Record Number: [id number] Patient Account Number: [id number] Date of Birth/Sex: Treating RN: 1980-11-18 (42 y.o. Steve [name] Primary Care Darrion Wyszynski: Steve [name] Other Clinician: Referring Brinden Kincheloe: Treating Evola Hollis/Extender: Steve [name] Steve [name]: 0 Vital Signs Height(in): 74 Pulse(bpm): 89 Weight(lbs): 230 Blood Pressure(mmHg): 161/106 Body Mass Index(BMI): 29.5 Temperature(F): 97.7 Respiratory Rate(breaths/min): 18 [1:Photos:]  [N/A:N/A] Scrotum N/A N/A Wound Location: Gradually Appeared N/A N/A Wounding Event: Open Surgical Wound N/A N/A Primary Etiology: Hypertension, Type II Diabetes N/A N/A Comorbid History: 01/03/2023 N/A N/A Date AcquiredWEILAND, Steve [name] ([id number]) [id number] Page 5 of 9 0 N/A N/A Steve of [name]: Open N/A N/A Wound Status: No N/A N/A Wound Recurrence: 11x8x6.5 N/A N/A Measurements L x W x D (cm) 69.115 N/A N/A A (cm) : rea 449.248 N/A N/A Volume (cm) : Full Thickness Without Exposed N/A N/A Classification: Support Structures Medium N/A N/A Exudate Amount: Serosanguineous N/A N/A Exudate Type: red, brown N/A N/A Exudate Color: Large (67-100%) N/A N/A Granulation Amount: Red N/A N/A Granulation Quality:  Small (1-33%) N/A N/A Necrotic Amount: Fat Layer (Subcutaneous Tissue): Yes N/A N/A Exposed Structures: Fascia: No Tendon: No Muscle: No Joint: No Bone: No None N/A N/A Epithelialization: [name] Notes Electronic Signature(s) Signed: 01/16/2023 8:03:28 AM By: Steve Pax RN Entered By: Steve [name] on 01/13/2023 14:57:00 -------------------------------------------------------------------------------- Multi-Disciplinary Care Plan Details Patient Name: Date of Service: Steve [name] 01/13/2023 2:30 PM Medical Record Number: [id number] Patient Account Number: [id number] Date of Birth/Sex: Treating RN: [birth year] (42 y.o. Steve [name] Primary Care Olanna Percifield: Steve [name] Other Clinician: Referring Haelee Bolen: Treating Abad Manard/Extender: Steve [name] Steve [name]: 0 Active Inactive Necrotic Tissue Nursing Diagnoses: Knowledge deficit related to management of necrotic/devitalized tissue Goals: Patient/caregiver will verbalize understanding of reason and process for debridement of necrotic tissue Date Initiated: 01/13/2023 Target Resolution Date: 02/13/2023 Goal Status:  Active Interventions: Assess patient pain level pre-, during and post procedure and prior to discharge [name] Activities: Apply topical anesthetic as ordered : 01/13/2023 Notes: Nutrition Nursing Diagnoses: Potential for alteratiion in Nutrition/Potential for imbalanced nutrition Steve [name], Steve [name] ([id number]) [id number] Page 6 of 9 Goals: Patient/caregiver verbalizes understanding of need to maintain therapeutic glucose control per primary care physician Date Initiated: 01/13/2023 Target Resolution Date: 02/13/2023 Goal Status: Active Interventions: Assess HgA1c results as ordered upon admission and as needed Notes: Wound/Skin Impairment Nursing Diagnoses: Knowledge deficit related to ulceration/compromised skin integrity Goals: Patient/caregiver will verbalize understanding of skin care regimen Date Initiated: 01/13/2023 Target Resolution Date: 02/13/2023 Goal Status: Active Ulcer/skin breakdown will have a volume reduction of 30% by week 4 Date Initiated: 01/13/2023 Target Resolution Date: 02/13/2023 Goal Status: Active Ulcer/skin breakdown will have a volume reduction of 50% by week 8 Date Initiated: 01/13/2023 Target Resolution Date: 03/15/2023 Goal Status: Active Ulcer/skin breakdown will have a volume reduction of 80% by week 12 Date Initiated: 01/13/2023 Target Resolution Date: 04/15/2023 Goal Status: Active Ulcer/skin breakdown will heal within 14 Steve Date Initiated: 01/13/2023 Target Resolution Date: 05/15/2023 Goal Status: Active Interventions: Assess patient/caregiver ability to obtain necessary supplies Assess patient/caregiver ability to perform ulcer/skin care regimen upon admission and as needed Assess ulceration(s) every visit Notes: Electronic Signature(s) Signed: 01/13/2023 3:03:12 PM By: Steve Pax RN Entered By: Steve [name] on 01/13/2023  15:03:12 -------------------------------------------------------------------------------- Pain Assessment Details Patient Name: Date of Service: Steve [name] [facility] [name] 01/13/2023 2:30 PM Medical Record Number: [id number] Patient Account Number: [id number] Date of Birth/Sex: Treating RN: [birth year] (42 y.o. Steve [name] Primary Care Doylene Splinter: Steve [name] Other Clinician: Referring Krishiv Sandler: Treating Franceen Erisman/Extender: Steve [name] Steve [name]: 0 Active Problems Location of Pain Severity and Description of Pain Patient Has Paino Yes Site Locations With Dressing ChangeJALEIL, Steve [name] ([id number]) [id number] Page 7 of 9 Duration of the Pain. Constant / Intermittento Intermittent How Long Does it Lasto Hours: Minutes: 15 Rate the pain. Current Pain Level: 4 Worst Pain Level: 6 Least Pain Level: 0 Tolerable Pain Level: 5 Character of Pain Describe the Pain: Aching Pain Management and Medication Current Pain Management: Medication: Yes Cold Application: No Rest: Yes Massage: No Activity: No [name].E.N.S.: No Heat Application: No Leg drop or elevation: No Is the Current Pain Management Adequate: Inadequate How does your wound impact your activities of daily livingo Sleep: Yes Bathing: No Appetite: No Relationship With Others: No Bladder Continence: No Emotions: No Bowel Continence: No Work: No Toileting: No Drive: No Dressing: No Hobbies: No Electronic Signature(s) Signed: 01/16/2023 8:03:28 AM By: Steve Pax RN Entered By: Steve [name] on 01/13/2023 14:41:39 --------------------------------------------------------------------------------  Patient/Caregiver Education Details Patient Name: Date of Service: Steve [name] 8/12/2024andnbsp2:30 PM Medical Record Number: [id number] Patient Account Number: [id number] Date of Birth/Gender: Treating RN: 1981-03-19 (42 y.o. Steve [name]) Steve [name] Primary  Care Physician: Steve [name] Other Clinician: Referring Physician: Treating Physician/Extender: Steve [name] Steve [name]: 0 Education Assessment Education Provided To: Patient Education Topics Provided Welcome [name]-New Patient Packet: o Handouts: Welcome [name] o Methods: Explain/Verbal Steve [name], Steve [name] ([id number]) [id number] Page 8 of 9 Responses: State content correctly Electronic Signature(s) Signed: 01/16/2023 8:03:28 AM By: Steve Pax RN Entered By: Steve [name] on 01/13/2023 15:03:35 -------------------------------------------------------------------------------- Wound Assessment Details Patient Name: Date of Service: Steve [name] [facility] [name] 01/13/2023 2:30 PM Medical Record Number: [id number] Patient Account Number: [id number] Date of Birth/Sex: Treating RN: 1980/08/12 (42 y.o. Steve [name]) Steve [name] Primary Care Sadhana Frater: Steve [name] Other Clinician: Referring Janett Kamath: Treating Gustavo Dispenza/Extender: Steve [name] Steve [name]: 0 Wound Status Wound Number: 1 Primary Etiology: Open Surgical Wound Wound Location: Scrotum Wound Status: Open Wounding Event: Gradually Appeared Comorbid History: Hypertension, Type II Diabetes Date Acquired: 01/03/2023 Steve Of [name]: 0 Clustered Wound: No Photos Wound Measurements Length: (cm) 11 Width: (cm) 8 Depth: (cm) 6.5 Area: (cm) 69.115 Volume: (cm) 449.248 % Reduction in Area: % Reduction in Volume: Epithelialization: None Tunneling: No Undermining: No Wound Description Classification: Full Thickness Without Exposed Support Structures Exudate Amount: Medium Exudate Type: Serosanguineous Exudate Color: red, brown Foul Odor After Cleansing: No Slough/Fibrino No Wound Bed Granulation Amount: Large (67-100%) Exposed Structure Granulation Quality: Red Fascia Exposed: No Necrotic Amount: Small  (1-33%) Fat Layer (Subcutaneous Tissue) Exposed: Yes Necrotic Quality: Adherent Slough Tendon Exposed: No Muscle Exposed: No Joint Exposed: No Bone Exposed: No Westermeyer, Rodrick [name] ([id number]) [id number].pdf Page 9 of 9 [name] Notes Wound #1 (Scrotum) Cleanser Normal Saline Discharge Instruction: Wash your hands with soap and water. Remove old dressing, discard into plastic bag and place into trash. Cleanse the wound with Normal Saline prior to applying a clean dressing using gauze sponges, not tissues or cotton balls. Do not scrub or use excessive force. Pat dry using gauze sponges, not tissue or cotton balls. Peri-Wound Care Topical Primary Dressing Gauze Discharge Instruction: moistened with saline Secondary Dressing ABD Pad 5x9 (in/in) Discharge Instruction: Cover with ABD pad Secured With Compression Wrap Compression Stockings Add-Ons Electronic Signature(s) Signed: 01/16/2023 8:03:28 AM By: Steve Pax RN Entered By: Steve [name] on 01/13/2023 14:56:39 -------------------------------------------------------------------------------- Vitals Details Patient Name: Date of Service: Steve [name] 01/13/2023 2:30 PM Medical Record Number: [id number] Patient Account Number: [id number] Date of Birth/Sex: Treating RN: 1981-02-04 (42 y.o. Steve [name] Primary Care Taviana Westergren: Steve [name] Other Clinician: Referring Bunyan Brier: Treating Shannon Kirkendall/Extender: Steve [name] Steve [name]: 0 Vital Signs Time Taken: 14:41 Temperature (F): 97.7 Height (in): 74 Pulse (bpm): 89 Source: Stated Respiratory Rate (breaths/min): 18 Weight (lbs): 230 Blood Pressure (mmHg): 161/106 Source: Stated Reference Range: 80 - 120 mg / dl Body Mass Index (BMI): 29.5 Electronic Signature(s) Signed: 01/16/2023 8:03:28 AM By: Steve Pax RN Entered By: Steve [name] on 01/13/2023 14:42:08

## 2023-01-16 NOTE — Progress Notes (Signed)
Steve [name] [name] ([id number]) [id number] Page 1 of 5 Visit Report for 01/13/2023 Abuse Risk Screen Details Patient Name: Date of Service: Steve [name] [facility] [name] 01/13/2023 2:30 PM Medical Record Number: [id number] Patient Account Number: [id number] Date of Birth/Sex: Treating RN: [birth year] (41 y.o. Steve [name]) Steve [name] Primary Care Steve [name]: Steve [name] Other Clinician: Referring Steve [name]: Treating Steve [name]/Extender: Steve [name] in Treatment: 0 Abuse Risk Screen Items Answer ABUSE RISK SCREEN: Has anyone close to you tried to hurt or harm you recentlyo No Do you feel uncomfortable with anyone in your familyo No Has anyone forced you do things that you didnt want to doo No Electronic Signature(s) Signed: 01/16/2023 8:03:28 AM By: Steve Pax RN Entered By: Steve [name] on 01/13/2023 14:44:29 -------------------------------------------------------------------------------- Activities of Daily Living Details Patient Name: Date of Service: Steve [name] [name] 01/13/2023 2:30 PM Medical Record Number: [id number] Patient Account Number: [id number] Date of Birth/Sex: Treating RN: 1981-05-08 (41 y.o. Steve [name] Primary Care Daesia Zylka: Steve [name] Other Clinician: Referring Deloy Archey: Treating Darlyn Repsher/Extender: Steve [name] in Treatment: 0 Activities of Daily Living Items Answer Activities of Daily Living (Please select one for each item) Drive Automobile Not Able [name] Medications ake Completely Able Use [name] elephone Completely Able Care for Appearance Need Assistance Use [name] oilet Need Assistance Bath / Shower Need Assistance Dress Self Need Assistance Feed Self Completely Able Walk Completely Able Get In / Out Bed Need Assistance Housework Not Steve [name], Steve [name] ([id number]) [id number] Page 2 of 5 Prepare Meals Not Able Handle Money Completely  Able Shop for Self Not Able Electronic Signature(s) Signed: 01/16/2023 8:03:28 AM By: Steve Pax RN Entered By: Steve [name] on 01/13/2023 14:45:19 -------------------------------------------------------------------------------- Education Screening Details Patient Name: Date of Service: Steve Proud [name] 01/13/2023 2:30 PM Medical Record Number: [id number] Patient Account Number: [id number] Date of Birth/Sex: Treating RN: [birth year] (41 y.o. Steve [name] Primary Care Giorgi Debruin: Steve [name] Other Clinician: Referring Jovannie Ulibarri: Treating Markevious Ehmke/Extender: Steve [name] in Treatment: 0 Primary Learner Assessed: Patient Learning Preferences/Education Level/Primary Language Learning Preference: Explanation Highest Education Level: High School Preferred Language: English Cognitive Barrier Language Barrier: No Translator Needed: No Memory Deficit: No Emotional Barrier: No Cultural/Religious Beliefs Affecting Medical Care: No Physical Barrier Impaired Vision: Yes Glasses Impaired Hearing: No Decreased Hand dexterity: No Knowledge/Comprehension Knowledge Level: Medium Comprehension Level: High Ability to understand written instructions: High Ability to understand verbal instructions: High Motivation Anxiety Level: Anxious Cooperation: Cooperative Education Importance: Acknowledges Need Interest in Health Problems: Asks Questions Perception: Coherent Willingness to Engage in Self-Management High Activities: Readiness to Engage in Self-Management High Activities: Electronic Signature(s) Signed: 01/16/2023 8:03:28 AM By: Steve Pax RN Entered By: Steve [name] on 01/13/2023 14:45:56 Steve [name] ([id number]) [phone] [id number].pdf Page 3 of 5 -------------------------------------------------------------------------------- Fall Risk Assessment Details Patient Name: Date of Service: Steve [name] [facility] [name]  01/13/2023 2:30 PM Medical Record Number: [id number] Patient Account Number: [id number] Date of Birth/Sex: Treating RN: [birth year] (41 y.o. Steve [name]) Steve [name] Primary Care Tamryn Popko: Steve [name] Other Clinician: Referring Mikel Pyon: Treating Annabelle Rexroad/Extender: Steve [name] in Treatment: 0 Fall Risk Assessment Items Have you had 2 or more falls in the last 12 monthso 0 No Have you had any fall that resulted in injury in the last 12 monthso 0 No FALLS RISK SCREEN History of falling - immediate or within 3 months 0 No Secondary diagnosis (Do you have 2 or more medical diagnoseso) 0 No  Ambulatory aid None/bed rest/wheelchair/nurse 0 Yes Crutches/cane/walker 0 No Furniture 0 No Intravenous therapy Access/Saline/Heparin Lock 0 No Gait/Transferring Normal/ bed rest/ wheelchair 0 Yes Weak (short steps with or without shuffle, stooped but able to lift head while walking, may seek 0 No support from furniture) Impaired (short steps with shuffle, may have difficulty arising from chair, head down, impaired 0 No balance) Mental Status Oriented to own ability 0 Yes Electronic Signature(s) Signed: 01/16/2023 8:03:28 AM By: Steve Pax RN Entered By: Steve [name] on 01/13/2023 14:46:08 -------------------------------------------------------------------------------- Foot Assessment Details Patient Name: Date of Service: Steve Proud [name] 01/13/2023 2:30 PM Medical Record Number: [id number] Patient Account Number: [id number] Date of Birth/Sex: Treating RN: 22-Oct-1980 (41 y.o. Steve [name] Primary Care Louanna Vanliew: Steve [name] Other Clinician: Referring Brit Carbonell: Treating Jaquin Coy/Extender: Steve [name] in Treatment: 0 Foot Assessment Items Site Locations [place] [name] ([id number]) [id number] Page 4 of 5 + = Sensation present, - = Sensation absent, C = Callus, U = Ulcer R = Redness, W =  Warmth, M = Maceration, PU = Pre-ulcerative lesion F = Fissure, S = Swelling, D = Dryness Assessment Right: Left: Other Deformity: No No Prior Foot Ulcer: No No Prior Amputation: No No Charcot Joint: No No Ambulatory Status: Ambulatory Without Help Gait: Unsteady Electronic Signature(s) Signed: 01/16/2023 8:03:28 AM By: Steve Pax RN Entered By: Steve [name] on 01/13/2023 14:46:26 -------------------------------------------------------------------------------- Nutrition Risk Screening Details Patient Name: Date of Service: Steve [name] [facility] [name] 01/13/2023 2:30 PM Medical Record Number: [id number] Patient Account Number: [id number] Date of Birth/Sex: Treating RN: 1980-11-26 (41 y.o. Steve [name]) Steve [name] Primary Care Breniyah Romm: Steve [name] Other Clinician: Referring Emma-Lee Oddo: Treating Javad Salva/Extender: Steve [name] in Treatment: 0 Height (in): 74 Weight (lbs): 230 Body Mass Index (BMI): 29.5 Nutrition Risk Screening Items Score Screening NUTRITION RISK SCREEN: I have an illness or condition that made me change the kind and/or amount of food I eat 0 No I eat fewer than two meals per day 0 No I eat few fruits and vegetables, or milk products 0 No I have three or more drinks of beer, liquor or wine almost every day 0 No I have tooth or mouth problems that make it hard for me to eat 0 No I don'[name] always have enough money to buy the food I need 0 No Steve [name], Steve [name] ([id number]) [id number] [id number] Page 5 of 5 I eat alone most of the time 0 No I take three or more different prescribed or over-the-counter drugs a day 1 Yes Without wanting to, I have lost or gained 10 pounds in the last six months 0 No I am not always physically able to shop, cook and/or feed myself 0 No Nutrition Protocols Good Risk Protocol 0 No interventions needed Moderate Risk Protocol High Risk Proctocol Risk Level: Good Risk Score: 1 Electronic  Signature(s) Signed: 01/16/2023 8:03:28 AM By: Steve Pax RN Entered By: Steve [name] on 01/13/2023 14:46:18

## 2023-01-17 ENCOUNTER — Inpatient Hospital Stay: Payer: 59 | Admitting: Physician Assistant

## 2023-01-17 NOTE — Progress Notes (Deleted)
Acute Office Visit   Patient: Steve [name]   DOB: 25-Jul-1980   42 y.o. Male  MRN: [id number] Visit Date: 01/17/2023  Today's healthcare provider: Oswaldo Conroy Akeia Perot, PA-C  Introduced myself to the patient as a Secondary school teacher and provided education on APPs in clinical practice.    No chief complaint on file.  Subjective    HPI      Transition of Care Hospital Follow up.   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 Severe Hyponatremia-improved Hyperglycemia Type 2 diabetes mellitus with hyperglycemia, without long-term current use of insulin (HCC), uncontrolled Dyslipidemia Essential hypertension GERD without esophagitis Constipation Hypokalemia  Date of interactive Contact within 48 hours of discharge: 01/07/23 Contact was through: phone  Date of 7 day or 14 day face-to-face visit:    within 14 days  Outpatient Encounter Medications as of 01/17/2023  Medication Sig Note   acetaminophen (TYLENOL) 325 MG tablet Take 2 tablets (650 mg total) by mouth every 6 (six) hours as needed for mild pain (or Fever >/= 101).    amLODipine (NORVASC) 5 MG tablet Take 1 tablet (5 mg total) by mouth daily.    Blood Glucose Monitoring Suppl DEVI 1 each by Does not apply route in the morning, at noon, and at bedtime. May substitute to any manufacturer covered by patient's insurance.    cefTRIAXone (ROCEPHIN) IVPB Inject 2 g into the vein daily for 10 days. Indication:  Fourniers gangrene First Dose: Yes Last Day of Therapy: 01/17/23 Labs - Once weekly:  CBC/D and CMP Method of administration: IV Push Method of administration may be changed at the discretion of home infusion pharmacist based upon assessment of the patient and/or caregiver's ability to self-administer the  medication ordered. Fax weekly lab results promptly to [phone] Call [phone] with any questions Please pull PIC at completion of IV antibiotics    Clotrimazole 1 % OINT Apply 0.5 inches topically in the morning and at bedtime. Apply to groin area    dexlansoprazole (DEXILANT) 60 MG capsule TAKE 1 CAPSULE BY MOUTH EVERY DAY 12/28/2022: Pt taking prn   fluconazole (DIFLUCAN) 200 MG tablet Take 2 tablets (400 mg total) by mouth daily for 10 days.    Glucose Blood (BLOOD GLUCOSE TEST STRIPS) STRP 1 each by In Vitro route in the morning, at noon, and at bedtime. May substitute to any manufacturer covered by patient's insurance.    Lancet Device MISC 1 each by Does not apply route in the morning, at noon, and at bedtime. May substitute to any manufacturer covered by patient's insurance.    metroNIDAZOLE (FLAGYL) 500 MG tablet Take 1 tablet (500 mg total) by mouth 2 (two) times daily.    ondansetron (ZOFRAN ODT) 4 MG disintegrating tablet Take 1 tablet (4 mg total) by mouth every 8 (eight) hours as needed for nausea or vomiting.    polyethylene glycol (MIRALAX / GLYCOLAX) 17 g packet Take 17 g by mouth daily.    rosuvastatin (CRESTOR) 5 MG tablet Take 1 tablet (5 mg total) by mouth daily.    tirzepatide [facility]) 2.5 MG/0.5ML Pen Inject 2.5 mg into the skin once a week.    traZODone (DESYREL) 50 MG tablet Take 0.5 tablets (25 mg total) by mouth at bedtime  as needed for sleep.    No facility-administered encounter medications on file as of 01/17/2023.    Diagnostic Tests Reviewed/Disposition: CT pelvis with contrast, [place] EKG site   Consults: Infectious disease, Urology   Discharge Instructions: Reviewed dressing change instructions: "Continue once daily wet-to-dry dressing changes with nursing (4x4 sterile gauze pads moistened with sterile saline to pack the wound and cover the exposed testes, covered with a dry ABD pad, and secured with mesh underwear"  Disease/illness Education:  Home  Health/Community Services Discussions/Referrals: he was discharged to home health and home infusion pharmacist to assist with IV abx therapy   Establishment or re-establishment of referral orders for community resources:  Discussion with other health care providers: none   Assessment and Support of treatment regimen adherence:  Appointments Coordinated with: There   Education for self-management, independent living, and ADLs:    Reviewed most recent notes from wound care (01/13/23) and Infectious disease (01/16/23)  His IV abx have been extended to 01/22/23 and recommended to have PICC line removed on 01/23/23 He is having outpatient plastic surgery coordinated for 01/23/23   Medications: Outpatient Medications Prior to Visit  Medication Sig   acetaminophen (TYLENOL) 325 MG tablet Take 2 tablets (650 mg total) by mouth every 6 (six) hours as needed for mild pain (or Fever >/= 101).   amLODipine (NORVASC) 5 MG tablet Take 1 tablet (5 mg total) by mouth daily.   Blood Glucose Monitoring Suppl DEVI 1 each by Does not apply route in the morning, at noon, and at bedtime. May substitute to any manufacturer covered by patient's insurance.   cefTRIAXone (ROCEPHIN) IVPB Inject 2 g into the vein daily for 10 days. Indication:  Fourniers gangrene First Dose: Yes Last Day of Therapy: 01/17/23 Labs - Once weekly:  CBC/D and CMP Method of administration: IV Push Method of administration may be changed at the discretion of home infusion pharmacist based upon assessment of the patient and/or caregiver's ability to self-administer the medication ordered. Fax weekly lab results promptly to [phone] Call [phone] with any questions Please pull PIC at completion of IV antibiotics   Clotrimazole 1 % OINT Apply 0.5 inches topically in the morning and at bedtime. Apply to groin area   dexlansoprazole (DEXILANT) 60 MG capsule TAKE 1 CAPSULE BY MOUTH EVERY DAY   fluconazole (DIFLUCAN) 200 MG tablet  Take 2 tablets (400 mg total) by mouth daily for 10 days.   Glucose Blood (BLOOD GLUCOSE TEST STRIPS) STRP 1 each by In Vitro route in the morning, at noon, and at bedtime. May substitute to any manufacturer covered by patient's insurance.   Lancet Device MISC 1 each by Does not apply route in the morning, at noon, and at bedtime. May substitute to any manufacturer covered by patient's insurance.   metroNIDAZOLE (FLAGYL) 500 MG tablet Take 1 tablet (500 mg total) by mouth 2 (two) times daily.   ondansetron (ZOFRAN ODT) 4 MG disintegrating tablet Take 1 tablet (4 mg total) by mouth every 8 (eight) hours as needed for nausea or vomiting.   polyethylene glycol (MIRALAX / GLYCOLAX) 17 g packet Take 17 g by mouth daily.   rosuvastatin (CRESTOR) 5 MG tablet Take 1 tablet (5 mg total) by mouth daily.   tirzepatide [facility]) 2.5 MG/0.5ML Pen Inject 2.5 mg into the skin once a week.   traZODone (DESYREL) 50 MG tablet Take 0.5 tablets (25 mg total) by mouth at bedtime as needed for sleep.   No facility-administered medications prior to visit.  Review of Systems  {Insert previous labs (optional):23779} {See past labs  Heme  Chem  Endocrine  Serology  Results Review (optional):1}   Objective    There were no vitals taken for this visit. {Insert last BP/Wt (optional):23777}{See vitals history (optional):1}   Physical Exam    No results found for any visits on 01/17/23.  Assessment & Plan      No follow-ups on file.

## 2023-01-20 DIAGNOSIS — J45909 Unspecified asthma, uncomplicated: Secondary | ICD-10-CM | POA: Diagnosis not present

## 2023-01-20 LAB — LAB REPORT - SCANNED: EGFR: 120

## 2023-01-21 ENCOUNTER — Other Ambulatory Visit: Payer: Self-pay | Admitting: Nurse Practitioner

## 2023-01-21 ENCOUNTER — Other Ambulatory Visit: Payer: Self-pay | Admitting: Physician Assistant

## 2023-01-21 ENCOUNTER — Encounter: Payer: Self-pay | Admitting: Physician Assistant

## 2023-01-21 ENCOUNTER — Encounter: Payer: 59 | Admitting: Physician Assistant

## 2023-01-21 DIAGNOSIS — F17200 Nicotine dependence, unspecified, uncomplicated: Secondary | ICD-10-CM | POA: Diagnosis not present

## 2023-01-21 DIAGNOSIS — I10 Essential (primary) hypertension: Secondary | ICD-10-CM | POA: Diagnosis not present

## 2023-01-21 DIAGNOSIS — E11622 Type 2 diabetes mellitus with other skin ulcer: Secondary | ICD-10-CM | POA: Diagnosis not present

## 2023-01-21 DIAGNOSIS — N493 Fournier gangrene: Secondary | ICD-10-CM | POA: Diagnosis not present

## 2023-01-21 DIAGNOSIS — T8189XA Other complications of procedures, not elsewhere classified, initial encounter: Secondary | ICD-10-CM | POA: Diagnosis not present

## 2023-01-21 DIAGNOSIS — E1165 Type 2 diabetes mellitus with hyperglycemia: Secondary | ICD-10-CM

## 2023-01-21 DIAGNOSIS — L98498 Non-pressure chronic ulcer of skin of other sites with other specified severity: Secondary | ICD-10-CM | POA: Diagnosis not present

## 2023-01-21 DIAGNOSIS — Z794 Long term (current) use of insulin: Secondary | ICD-10-CM | POA: Diagnosis not present

## 2023-01-21 NOTE — Telephone Encounter (Signed)
Pt called saying he was discharged from the hospital with the [facility] sensors and he needs some more.  He has ran out.  He uses [facility]  [phone]

## 2023-01-21 NOTE — Progress Notes (Unsigned)
          Acute Office Visit   Patient: Steve [name]   DOB: November 17, 1980   42 y.o. Male  MRN: [id number] Visit Date: 01/21/2023  Today's healthcare provider: Oswaldo Conroy Ariq Khamis, PA-C  Introduced myself to the patient as a Secondary school teacher and provided education on APPs in clinical practice.    No chief complaint on file.  Subjective    HPI    Medications: Outpatient Medications Prior to Visit  Medication Sig   acetaminophen (TYLENOL) 325 MG tablet Take 2 tablets (650 mg total) by mouth every 6 (six) hours as needed for mild pain (or Fever >/= 101).   amLODipine (NORVASC) 5 MG tablet Take 1 tablet (5 mg total) by mouth daily.   Blood Glucose Monitoring Suppl DEVI 1 each by Does not apply route in the morning, at noon, and at bedtime. May substitute to any manufacturer covered by patient's insurance.   Clotrimazole 1 % OINT Apply 0.5 inches topically in the morning and at bedtime. Apply to groin area   dexlansoprazole (DEXILANT) 60 MG capsule TAKE 1 CAPSULE BY MOUTH EVERY DAY   Glucose Blood (BLOOD GLUCOSE TEST STRIPS) STRP 1 each by In Vitro route in the morning, at noon, and at bedtime. May substitute to any manufacturer covered by patient's insurance.   Lancet Device MISC 1 each by Does not apply route in the morning, at noon, and at bedtime. May substitute to any manufacturer covered by patient's insurance.   metroNIDAZOLE (FLAGYL) 500 MG tablet Take 1 tablet (500 mg total) by mouth 2 (two) times daily.   ondansetron (ZOFRAN ODT) 4 MG disintegrating tablet Take 1 tablet (4 mg total) by mouth every 8 (eight) hours as needed for nausea or vomiting.   polyethylene glycol (MIRALAX / GLYCOLAX) 17 g packet Take 17 g by mouth daily.   rosuvastatin (CRESTOR) 5 MG tablet Take 1 tablet (5 mg total) by mouth daily.   tirzepatide [facility]) 2.5 MG/0.5ML Pen Inject 2.5 mg into the skin once a week.   traZODone (DESYREL) 50 MG tablet Take 0.5 tablets (25 mg total) by mouth at bedtime as needed for sleep.    No facility-administered medications prior to visit.    Review of Systems  {Insert previous labs (optional):23779} {See past labs  Heme  Chem  Endocrine  Serology  Results Review (optional):1}   Objective    There were no vitals taken for this visit. {Insert last BP/Wt (optional):23777}{See vitals history (optional):1}   Physical Exam    No results found for any visits on 01/21/23.  Assessment & Plan      No follow-ups on file.

## 2023-01-21 NOTE — Telephone Encounter (Signed)
Patient is requesting Libre sensors, not on current medication list. Routing for approval.

## 2023-01-21 NOTE — Telephone Encounter (Signed)
Appointment has been made for 8/23 please send courtesy refill foe libre sensors. Please call patient when complete.

## 2023-01-21 NOTE — Telephone Encounter (Signed)
Please call pharmacy and see if he was given full 100 supply as indicated on 01/06/23 - if so, he should have 30 day supply to get to his apt on 8/23.

## 2023-01-21 NOTE — Progress Notes (Addendum)
GAD, EMGE T ([id number]) [id number] Page 1 of 6 Visit Report for 01/21/2023 Chief Complaint Document Details Patient Name: Date of Service: Steve [name] [facility] T. 01/21/2023 12:30 PM Medical Record Number: [id number] Patient Account Number: [id number] Date of Birth/Sex: Treating RN: 08/18/1980 (42 y.o. [name]) Yevonne Pax Primary Care Provider: Larae Grooms Other Clinician: Referring Provider: Treating Provider/Extender: Layne Benton in Treatment: 1 Information Obtained from: Patient Chief Complaint Open surgical wound due to Fournier gangrene Electronic Signature(s) Signed: 01/21/2023 12:53:24 PM By: Allen Derry PA-C Entered By: Allen Derry on 01/21/2023 09:53:24 -------------------------------------------------------------------------------- HPI Details Patient Name: Date of Service: Steve Proud T. 01/21/2023 12:30 PM Medical Record Number: [id number] Patient Account Number: [id number] Date of Birth/Sex: Treating RN: [birth year] (42 y.o. [name] Primary Care Provider: Larae Grooms Other Clinician: Referring Provider: Treating Provider/Extender: Layne Benton in Treatment: 1 History of Present Illness HPI Description: 01-13-2023 upon evaluation today patient presents for a significant issue here regarding an issue where he had a Fournier gangrene which subsequently was manage inpatient. He ended up having to have obviously surgical intervention and unfortunately post intervention he does have now a significant wound which is in the perineal area which actually includes a portion of the scrotum as well that has been actually surgically removed and what appeared. Subsequently the testes are actually exposed to a degree and again this seems to be something that is more of a surgical things than just a wound. Thanks to me. I do not see a way that we can just get this to heal by second intent.  I think that he likely needs to see plastic surgery as soon as possible. I discussed that with the patient today and this is what the surgeon and the PA with the surgeon that I been discussing this case with as well has recommended but apparently plastic surgery wanted the patient seen by wound care first before they would proceed with any type of surgical intervention. The patient is currently on a Dexcom although he previously did have a significant elevation in his blood sugars back in July on the 28 2024 his A1c was 13.5. With that being said that is being worked on trying to get this under control. He does have a history again obviously of diabetes mellitus type 2, hypertension, and more recently the Fournier gangrene. The patient's wound actually started January 03, 2023 as a small abscess that then actually exponentially worsened. 01-21-2023 upon evaluation today patient appears to be doing actually better than what expected is regard to the wound. He actually does have an appointment with a plastic surgeon in coming days with: Plastic surgery. This is good news. Again the obvious goal is to try to see what can be done to get this close much more effectively and appropriately than just by second intent. The good news is he actually appears to have a significant mount of hypergranulation tissue over top of the testes as well as the area in general seems to want a healing. This is just taking a very long time unfortunately. I am also not certain that this is actually appropriately healing and the way that this is happening but again I am not sure what can be done differently hence the reason that we really want to see if there is a plastic surgery option towards getting this closed faster and more appropriately. EULON, LANMAN T ([id number]) [id number] Page 2 of 6 Electronic Signature(s) Signed: 01/21/2023 6:18:37 PM  By: Allen Derry PA-C Entered By: Allen Derry on  01/21/2023 15:18:37 -------------------------------------------------------------------------------- Physical Exam Details Patient Name: Date of Service: Steve [name] 01/21/2023 12:30 PM Medical Record Number: [id number] Patient Account Number: [id number] Date of Birth/Sex: Treating RN: 04-21-1981 (42 y.o. [name] Primary Care Provider: Larae Grooms Other Clinician: Referring Provider: Treating Provider/Extender: Layne Benton in Treatment: 1 Constitutional Well-nourished and well-hydrated in no acute distress. Respiratory normal breathing without difficulty. Psychiatric this patient is able to make decisions and demonstrates good insight into disease process. Alert and Oriented x 3. pleasant and cooperative. Notes Upon inspection patient's wound bed actually showed signs of good granulation epithelization at this point. I actually feel like he is having a lot of granulation tissue filling in here but again I am not certain that this is going to completely heal or heal appropriately. I discussed that with the patient and again I will do everything I can but at the same time I do believe plastic surgery is an appropriate way for him to go to see what they say when he sees them on Thursday. Electronic Signature(s) Signed: 01/21/2023 6:19:15 PM By: Allen Derry PA-C Entered By: Allen Derry on 01/21/2023 15:19:14 -------------------------------------------------------------------------------- Physician Orders Details Patient Name: Date of Service: Steve Proud T. 01/21/2023 12:30 PM Medical Record Number: [id number] Patient Account Number: [id number] Date of Birth/Sex: Treating RN: [birth year] (42 y.o. [name] Primary Care Provider: Larae Grooms Other Clinician: Referring Provider: Treating Provider/Extender: Layne Benton in Treatment: 1 Verbal / Phone Orders: No Diagnosis Coding ICD-10 Coding Code  Description AZEKIEL, PADLEY T ([id number]) [id number] Page 3 of 6 N49.3 Fournier gangrene L98.498 Non-pressure chronic ulcer of skin of other sites with other specified severity E11.622 Type 2 diabetes mellitus with other skin ulcer I10 Essential (primary) hypertension Follow-up Appointments Return Appointment in 1 week. Bathing/ Shower/ Hygiene Clean wound with Normal Saline or wound cleanser. No tub bath. Wound Treatment Wound #1 - Scrotum Cleanser: Normal Saline 1 x Per Day/30 Days Discharge Instructions: Wash your hands with soap and water. Remove old dressing, discard into plastic bag and place into trash. Cleanse the wound with Normal Saline prior to applying a clean dressing using gauze sponges, not tissues or cotton balls. Do not scrub or use excessive force. Pat dry using gauze sponges, not tissue or cotton balls. Prim Dressing: Gauze 1 x Per Day/30 Days ary Discharge Instructions: moistened with saline Secondary Dressing: ABD Pad 5x9 (in/in) 1 x Per Day/30 Days Discharge Instructions: Cover with ABD pad Electronic Signature(s) Signed: 01/21/2023 6:45:54 PM By: Allen Derry PA-C Signed: 01/31/2023 11:59:03 AM By: Yevonne Pax RN Entered By: Yevonne Pax on 01/21/2023 10:08:31 -------------------------------------------------------------------------------- Problem List Details Patient Name: Date of Service: Steve Proud T. 01/21/2023 12:30 PM Medical Record Number: [id number] Patient Account Number: [id number] Date of Birth/Sex: Treating RN: 06-10-1980 (41 y.o. [name] Primary Care Provider: Larae Grooms Other Clinician: Referring Provider: Treating Provider/Extender: Layne Benton in Treatment: 1 Active Problems ICD-10 Encounter Code Description Active Date MDM Diagnosis N49.3 Fournier gangrene 01/13/2023 No Yes L98.498 Non-pressure chronic ulcer of skin of other sites with other specified severity  01/13/2023 No Yes E11.622 Type 2 diabetes mellitus with other skin ulcer 01/13/2023 No Yes I10 Essential (primary) hypertension 01/13/2023 No Yes Fareed, Towry Robbie T ([id number]) [phone].pdf Page 4 of 6 Inactive Problems Resolved Problems Electronic Signature(s) Signed: 01/21/2023 12:53:22 PM By: Allen Derry PA-C Entered By: Allen Derry  on 01/21/2023 09:53:21 -------------------------------------------------------------------------------- Progress Note Details Patient Name: Date of Service: Steve [name] [facility] T. 01/21/2023 12:30 PM Medical Record Number: [id number] Patient Account Number: [id number] Date of Birth/Sex: Treating RN: Mar 25, 1981 (41 y.o. [name]) Yevonne Pax Primary Care Provider: Larae Grooms Other Clinician: Referring Provider: Treating Provider/Extender: Layne Benton in Treatment: 1 Subjective Chief Complaint Information obtained from Patient Open surgical wound due to Fournier gangrene History of Present Illness (HPI) 01-13-2023 upon evaluation today patient presents for a significant issue here regarding an issue where he had a Fournier gangrene which subsequently was manage inpatient. He ended up having to have obviously surgical intervention and unfortunately post intervention he does have now a significant wound which is in the perineal area which actually includes a portion of the scrotum as well that has been actually surgically removed and what appeared. Subsequently the testes are actually exposed to a degree and again this seems to be something that is more of a surgical things than just a wound. Thanks to me. I do not see a way that we can just get this to heal by second intent. I think that he likely needs to see plastic surgery as soon as possible. I discussed that with the patient today and this is what the surgeon and the PA with the surgeon that I been discussing this case with as well has recommended but  apparently plastic surgery wanted the patient seen by wound care first before they would proceed with any type of surgical intervention. The patient is currently on a Dexcom although he previously did have a significant elevation in his blood sugars back in July on the 28 2024 his A1c was 13.5. With that being said that is being worked on trying to get this under control. He does have a history again obviously of diabetes mellitus type 2, hypertension, and more recently the Fournier gangrene. The patient's wound actually started January 03, 2023 as a small abscess that then actually exponentially worsened. 01-21-2023 upon evaluation today patient appears to be doing actually better than what expected is regard to the wound. He actually does have an appointment with a plastic surgeon in coming days with: Plastic surgery. This is good news. Again the obvious goal is to try to see what can be done to get this close much more effectively and appropriately than just by second intent. The good news is he actually appears to have a significant mount of hypergranulation tissue over top of the testes as well as the area in general seems to want a healing. This is just taking a very long time unfortunately. I am also not certain that this is actually appropriately healing and the way that this is happening but again I am not sure what can be done differently hence the reason that we really want to see if there is a plastic surgery option towards getting this closed faster and more appropriately. Objective Constitutional Well-nourished and well-hydrated in no acute distress. Vitals Time Taken: 12:40 PM, Height: 74 in, Weight: 230 lbs, BMI: 29.5, Temperature: 97.5 F, Pulse: 99 bpm, Respiratory Rate: 18 breaths/min, Blood Pressure: 153/91 mmHg. Respiratory normal breathing without difficulty. Psychiatric this patient is able to make decisions and demonstrates good insight into disease process. Alert and Oriented  x 3. pleasant and cooperative. STONEY, REZA T ([id number]) [id number] Page 5 of 6 General Notes: Upon inspection patient's wound bed actually showed signs of good granulation epithelization at this point. I actually feel like he is  having a lot of granulation tissue filling in here but again I am not certain that this is going to completely heal or heal appropriately. I discussed that with the patient and again I will do everything I can but at the same time I do believe plastic surgery is an appropriate way for him to go to see what they say when he sees them on Thursday. Integumentary (Hair, Skin) Wound #1 status is Open. Original cause of wound was Gradually Appeared. The date acquired was: 01/03/2023. The wound has been in treatment 1 weeks. The wound is located on the Scrotum. The wound measures 9.5cm length x 6.5cm width x 2.7cm depth; 48.498cm^2 area and 130.946cm^3 volume. There is Fat Layer (Subcutaneous Tissue) exposed. There is no tunneling or undermining noted. There is a medium amount of serosanguineous drainage noted. There is large (67-100%) red granulation within the wound bed. There is a small (1-33%) amount of necrotic tissue within the wound bed including Adherent Slough. 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 Bathing/ Shower/ Hygiene: Clean wound with Normal Saline or wound cleanser. No tub bath. WOUND #1: - Scrotum Wound Laterality: Cleanser: Normal Saline 1 x Per Day/30 Days Discharge Instructions: Wash your hands with soap and water. Remove old dressing, discard into plastic bag and place into trash. Cleanse the wound with Normal Saline prior to applying a clean dressing using gauze sponges, not tissues or  cotton balls. Do not scrub or use excessive force. Pat dry using gauze sponges, not tissue or cotton balls. Prim Dressing: Gauze 1 x Per Day/30 Days ary Discharge Instructions: moistened with saline Secondary Dressing: ABD Pad 5x9 (in/in) 1 x Per Day/30 Days Discharge Instructions: Cover with ABD pad 1. Based on what I see currently I do believe that the patient should continue to monitor for any signs of infection or worsening. Based on what I see and right now I think that continuing with the Dakin's moistened gauze dressing is going to the way to go. 2. I am also going to recommend that he should continue with the ABD pads to cover. 3. I would also recommend that he try as much as possible to keep the area clean is doing an awesome job and to be honest I think this is healing much faster than what I would have expected but again with the granulation tissue solidifying around the testes and not exactly sure how this is going to play out at the end hence the reason I do want the plastic surgery evaluation. We will see patient back for reevaluation in 1 week here in the clinic. If anything worsens or changes patient will contact our office for additional recommendations. Electronic Signature(s) Signed: 01/21/2023 6:20:02 PM By: Allen Derry PA-C Entered By: Allen Derry on 01/21/2023 15:20:02 -------------------------------------------------------------------------------- SuperBill Details Patient Name: Date of Service: Steve [name] 01/21/2023 Medical Record Number: [id number] Patient Account Number: [id number] ADYAN, HARBESON T ([id number]) [id number] Page 6 of 6 Date of Birth/Sex: Treating RN: 1981/05/12 (41 y.o. [name]) Yevonne Pax Primary Care Provider: Larae Grooms Other Clinician: Referring Provider: Treating Provider/Extender: Layne Benton in Treatment: 1 Diagnosis Coding ICD-10 Codes Code Description N49.3 Fournier  gangrene [phone] Non-pressure chronic ulcer of skin of other sites with other specified severity E11.622 Type 2 diabetes mellitus with other skin ulcer I10  Essential (primary) hypertension Facility Procedures : CPT4 Code: [id number] Description: [phone] - WOUND CARE VISIT-LEV 2 EST PT Modifier: Quantity: 1 Physician Procedures : CPT4 Code Description Modifier [id number] - WC PHYS LEVEL 3 - EST PT ICD-10 Diagnosis Description N49.3 Fournier gangrene L98.498 Non-pressure chronic ulcer of skin of other sites with other specified severity E11.622 Type 2 diabetes mellitus with  other skin ulcer I10 Essential (primary) hypertension Quantity: 1 Electronic Signature(s) Signed: 01/21/2023 6:20:21 PM By: Allen Derry PA-C Entered By: Allen Derry on 01/21/2023 15:20:21

## 2023-01-21 NOTE — Telephone Encounter (Signed)
Patient needs apt as he missed his hospital follow up. We can write a courtesy refill but he needs to be seen.

## 2023-01-21 NOTE — Telephone Encounter (Signed)
Called and spoke with patient's local pharmacy. Pharmacy technician says she currently only has 30 gauge needles and they are requesting 33 gauge needles. So, she is working to find the requested needles and would notify the patient.

## 2023-01-22 ENCOUNTER — Telehealth: Payer: Self-pay | Admitting: Nurse Practitioner

## 2023-01-22 MED ORDER — FREESTYLE LIBRE 14 DAY SENSOR MISC
1.0000 | 0 refills | Status: DC
Start: 2023-01-22 — End: 2023-01-30

## 2023-01-22 NOTE — Addendum Note (Signed)
Addended by: Malen Gauze on: 01/22/2023 09:40 AM   Modules accepted: Orders

## 2023-01-22 NOTE — Telephone Encounter (Addendum)
Patient called and states that he will not use the lancets because it can cause nerve damage to his fingers.Patient stated that he is currently using the [facility] sensor from the hospital. Patient is requesting one sensor to last until his appointment on 8/23. Please send Rx for [place] sensors to  [id number] - [place], [place] - [address] Phone: [phone]  Fax: [phone]

## 2023-01-22 NOTE — Telephone Encounter (Signed)
Steve [name] with [facility] looked through the chart and cannot find anything stating that the pt is legally blind or with visual disturbances.   Steve [name] is needing verbal confirmation, you ask her or [place].   [phone]

## 2023-01-22 NOTE — Telephone Encounter (Signed)
Return call to Marisue Ivan from [facility] to inform her that we do not have a diagnosis of vision problems listed underneath his "Problem List" in his chart. Advised Marisue Ivan she may need to reach out to his Ophthalmologist and see if they have a diagnosis for the patient. Marisue Ivan verbalized understanding.

## 2023-01-23 ENCOUNTER — Ambulatory Visit (INDEPENDENT_AMBULATORY_CARE_PROVIDER_SITE_OTHER): Payer: 59 | Admitting: Plastic Surgery

## 2023-01-23 ENCOUNTER — Encounter: Payer: Self-pay | Admitting: Plastic Surgery

## 2023-01-23 ENCOUNTER — Ambulatory Visit: Payer: 59 | Admitting: Infectious Diseases

## 2023-01-23 ENCOUNTER — Encounter: Payer: Self-pay | Admitting: Infectious Diseases

## 2023-01-23 VITALS — BP 141/92 | HR 86 | Ht 73.0 in | Wt 220.6 lb

## 2023-01-23 VITALS — BP 132/91 | HR 78 | Temp 97.1°F

## 2023-01-23 DIAGNOSIS — N493 Fournier gangrene: Secondary | ICD-10-CM

## 2023-01-23 DIAGNOSIS — Z794 Long term (current) use of insulin: Secondary | ICD-10-CM | POA: Insufficient documentation

## 2023-01-23 DIAGNOSIS — E1152 Type 2 diabetes mellitus with diabetic peripheral angiopathy with gangrene: Secondary | ICD-10-CM | POA: Diagnosis not present

## 2023-01-23 NOTE — Progress Notes (Signed)
NAME: Steve [name]  DOB: March 09, 1981  MRN: [id number]  Date/Time: 01/23/2023 9:16 AM   Subjective:   Follow up fournier's gangrene? Steve [name] is a 42 y.o. male with a history of DM was in [facility] between 12/28/22-01/06/23 for infected scrotal tissue , fournier's gangrene and underwent debridement on 12/29/22 Culture sent on 7/29 was ecoli, candida As the ecoli was resistant  to oral antibiotics he was sent on IV ceftriaxone+ Po flagyl + pol fluconazole for 2 weeks and it was extended by a week  Pt is doing much better He has plastic surgery appt today   Past Medical History:  Diagnosis Date   Diabetes mellitus without complication (HCC)    GERD (gastroesophageal reflux disease)    History of gastritis 09/2016   Hypertension     Past Surgical History:  Procedure Laterality Date   COLONOSCOPY WITH PROPOFOL N/A 11/15/2019   Procedure: COLONOSCOPY WITH PROPOFOL WITH POLYPECTOMY, BIOPSIES;  Surgeon: Midge Minium, MD;  Location: [facility];  Service: Endoscopy;  Laterality: N/A;   ESOPHAGOGASTRODUODENOSCOPY (EGD) WITH PROPOFOL N/A 11/15/2019   Procedure: ESOPHAGOGASTRODUODENOSCOPY (EGD) WITH PROPOFOL WITH POLYPECTOMY, BIOPSIES;  Surgeon: Midge Minium, MD;  Location: [facility];  Service: Endoscopy;  Laterality: N/A;   INCISION AND DRAINAGE ABSCESS N/A 12/29/2022   Procedure: INCISION AND DEBRIDEMENT;  Surgeon: Crista Elliot, MD;  Location: [facility];  Service: Urology;  Laterality: N/A;   TOOTH EXTRACTION      Social History   Socioeconomic History   Marital status: Single    Spouse name: Not on file   Number of children: Not on file   Years of education: Not on file   Highest education level: Not on file  Occupational History   Not on file  Tobacco Use   Smoking status: Every Day    Current packs/day: 0.25    Average packs/day: 0.3 packs/day for 23.0 years (5.8 ttl pk-yrs)    Types: Cigarettes   Smokeless tobacco: Never   Tobacco comments:    since  age 55  Vaping Use   Vaping status: Every Day  Substance and Sexual Activity   Alcohol use: No   Drug use: Yes    Types: Marijuana   Sexual activity: Yes  Other Topics Concern   Not on file  Social History Narrative   Not on file   Social Determinants of Health   Financial Resource Strain: Medium Risk (12/02/2022)   Overall Financial Resource Strain (CARDIA)    Difficulty of Paying Living Expenses: Somewhat hard  Food Insecurity: No Food Insecurity (01/07/2023)   Hunger Vital Sign    Worried About Running Out of Food in the Last Year: Never true    Ran Out of Food in the Last Year: Never true  Transportation Needs: No Transportation Needs (01/07/2023)   PRAPARE - Administrator, Civil Service (Medical): No    Lack of Transportation (Non-Medical): No  Physical Activity: Sufficiently Active (12/02/2022)   Exercise Vital Sign    Days of Exercise per Week: 3 days    Minutes of Exercise per Session: 60 min  Stress: No Stress Concern Present (12/02/2022)   [employer] of Occupational Health - Occupational Stress Questionnaire    Feeling of Stress : Not at all  Social Connections: Socially Isolated (12/02/2022)   Social Connection and Isolation Panel [NHANES]    Frequency of Communication with Friends and Family: Once a week    Frequency of Social Gatherings with Friends and Family:  Never    Attends Religious Services: Never    Active Member of Clubs or Organizations: Yes    Attends Banker Meetings: Never    Marital Status: Never married  Intimate Partner Violence: Not At Risk (12/29/2022)   Humiliation, Afraid, Rape, and Kick questionnaire    Fear of Current or Ex-Partner: No    Emotionally Abused: No    Physically Abused: No    Sexually Abused: No    Family History  Problem Relation Age of Onset   Diabetes Brother    Hypertension Brother    Diabetes Maternal Aunt    Hypertension Maternal Aunt    Cancer Maternal Aunt        pancreas   Diabetes  Maternal Uncle    Hypertension Maternal Uncle    Cancer Maternal Uncle        bone   Diabetes Maternal Grandmother    Cancer Maternal Grandmother    Diabetes Maternal Grandfather    Cancer Maternal Uncle        lung   COPD Neg Hx    Heart disease Neg Hx    Stroke Neg Hx    No Known Allergies I? Current Outpatient Medications  Medication Sig Dispense Refill   acetaminophen (TYLENOL) 325 MG tablet Take 2 tablets (650 mg total) by mouth every 6 (six) hours as needed for mild pain (or Fever >/= 101). 20 tablet 0   amLODipine (NORVASC) 5 MG tablet Take 1 tablet (5 mg total) by mouth daily. 30 tablet 2   Blood Glucose Monitoring Suppl DEVI 1 each by Does not apply route in the morning, at noon, and at bedtime. May substitute to any manufacturer covered by patient's insurance. 1 each 0   Clotrimazole 1 % OINT Apply 0.5 inches topically in the morning and at bedtime. Apply to groin area 30 g 0   Continuous Glucose Sensor (FREESTYLE LIBRE 14 DAY SENSOR) MISC 1 Device by Does not apply route every 14 (fourteen) days. 1 each 0   dexlansoprazole (DEXILANT) 60 MG capsule TAKE 1 CAPSULE BY MOUTH EVERY DAY 90 capsule 2   Glucose Blood (BLOOD GLUCOSE TEST STRIPS) STRP 1 each by In Vitro route in the morning, at noon, and at bedtime. May substitute to any manufacturer covered by patient's insurance. 100 strip 0   Lancet Device MISC 1 each by Does not apply route in the morning, at noon, and at bedtime. May substitute to any manufacturer covered by patient's insurance. 1 each 0   metroNIDAZOLE (FLAGYL) 500 MG tablet Take 1 tablet (500 mg total) by mouth 2 (two) times daily. 14 tablet 0   ondansetron (ZOFRAN ODT) 4 MG disintegrating tablet Take 1 tablet (4 mg total) by mouth every 8 (eight) hours as needed for nausea or vomiting. 20 tablet 0   polyethylene glycol (MIRALAX / GLYCOLAX) 17 g packet Take 17 g by mouth daily. 14 each 0   rosuvastatin (CRESTOR) 5 MG tablet Take 1 tablet (5 mg total) by mouth  daily. 90 tablet 1   tirzepatide (MOUNJARO) 2.5 MG/0.5ML Pen Inject 2.5 mg into the skin once a week. 2 mL 0   traZODone (DESYREL) 50 MG tablet Take 0.5 tablets (25 mg total) by mouth at bedtime as needed for sleep. 30 tablet 0   No current facility-administered medications for this visit.     Abtx:  Anti-infectives (From admission, onward)    None       REVIEW OF SYSTEMS:  Const: negative fever, negative  chills, negative weight loss Eyes: negative diplopia or visual changes, negative eye pain ENT: negative coryza, negative sore throat Resp: negative cough, hemoptysis, dyspnea Cards: negative for chest pain, palpitations, lower extremity edema GU: scrotal wound Skin: negative for rash and pruritus Heme: negative for easy bruising and gum/nose bleeding MS: negative for myalgias, arthralgias, back pain and muscle weakness Neurolo:negative for headaches, dizziness, vertigo, memory problems  Psych: negative for feelings of anxiety, depression  Endocrine: , diabetes Allergy/Immunology- negative for any medication or food allergies ?  Objective:  VITALS:  BP (!) 132/91   Pulse 78   Temp (!) 97.1 F (36.2 C) (Temporal)   SpO2 98%    PHYSICAL EXAM:  General: Alert, cooperative, no distress, appears stated age. albinism Head: Normocephalic, without obvious abnormality, atraumatic. Eyes: Conjunctivae clear, anicteric sclerae. Pupils are equal ENT Nares normal. No drainage or sinus tenderness. Lips, mucosa, and tongue normal. No Thrush Neck: Supple, symmetrical, no adenopathy, thyroid: non tender no carotid bruit and no JVD. Back: No CVA tenderness. Lungs: Clear to auscultation bilaterally. No Wheezing or Rhonchi. No rales. Heart: Regular rate and rhythm, no murmur, rub or gallop. Abdomen: Soft, non-tender,not distended. Bowel sounds normal. No masses GU- scrotal surgical wound - has 90% healthy tissue- some slough  at the edge     Extremities: atraumatic, no cyanosis. No  edema. No clubbing Skin: No rashes or lesions. Or bruising Lymph: Cervical, supraclavicular normal. Neurologic: Grossly non-focal  ? Impression/Recommendation ? ?FOUNIER'S GANGRENE OF SCROTUM S/P EXTENSIVE DEBRIDEMENT On CEFTRIAXONE + FLAGYL- completed 3 weeks-  PICC removed in the office today Intetrigo- much better using clotrimazole  DM- on insulin  Plastic surgery appt today Follow with me PRN? ___________________________________________________ Discussed with patient in detail  Note:  This document was prepared using Dragon voice recognition software and may include unintentional dictation errors.

## 2023-01-23 NOTE — Progress Notes (Signed)
Referring Provider Carman Ching, PA-C [address] [place],  [place] [address]   CC:  Chief Complaint  Patient presents with   Consult      Steve [name] is an 42 y.o. male.  HPI: Steve [name] is a 42 year old male who developed Fournier's gangrene in July 2024.  He was hospitalized for 9 days and underwent debridement at that time.  He has been discharged and is at home now under the care of of the wound care team.  He is doing well and states that his sugars are well-controlled and he is no longer smoking.  He is anxious to have anything done that can speed his wound healing.  No Known Allergies  Outpatient Encounter Medications as of 01/23/2023  Medication Sig Note   acetaminophen (TYLENOL) 325 MG tablet Take 2 tablets (650 mg total) by mouth every 6 (six) hours as needed for mild pain (or Fever >/= 101).    amLODipine (NORVASC) 5 MG tablet Take 1 tablet (5 mg total) by mouth daily.    Blood Glucose Monitoring Suppl DEVI 1 each by Does not apply route in the morning, at noon, and at bedtime. May substitute to any manufacturer covered by patient's insurance.    Clotrimazole 1 % OINT Apply 0.5 inches topically in the morning and at bedtime. Apply to groin area    Continuous Glucose Sensor (FREESTYLE LIBRE 14 DAY SENSOR) MISC 1 Device by Does not apply route every 14 (fourteen) days.    dexlansoprazole (DEXILANT) 60 MG capsule TAKE 1 CAPSULE BY MOUTH EVERY DAY 12/28/2022: Pt taking prn   Glucose Blood (BLOOD GLUCOSE TEST STRIPS) STRP 1 each by In Vitro route in the morning, at noon, and at bedtime. May substitute to any manufacturer covered by patient's insurance.    Lancet Device MISC 1 each by Does not apply route in the morning, at noon, and at bedtime. May substitute to any manufacturer covered by patient's insurance.    polyethylene glycol (MIRALAX / GLYCOLAX) 17 g packet Take 17 g by mouth daily.    rosuvastatin (CRESTOR) 5 MG tablet Take 1 tablet (5 mg total) by  mouth daily.    tirzepatide [facility]) 2.5 MG/0.5ML Pen Inject 2.5 mg into the skin once a week.    traZODone (DESYREL) 50 MG tablet Take 0.5 tablets (25 mg total) by mouth at bedtime as needed for sleep.    [EXPIRED] cefTRIAXone (ROCEPHIN) IVPB Inject 2 g into the vein daily for 10 days. Indication:  Fourniers gangrene First Dose: Yes Last Day of Therapy: 01/17/23 Labs - Once weekly:  CBC/D and CMP Method of administration: IV Push Method of administration may be changed at the discretion of home infusion pharmacist based upon assessment of the patient and/or caregiver's ability to self-administer the medication ordered. Fax weekly lab results promptly to [phone] Call [phone] with any questions Please pull PIC at completion of IV antibiotics    [EXPIRED] fluconazole (DIFLUCAN) 200 MG tablet Take 2 tablets (400 mg total) by mouth daily for 10 days.    metroNIDAZOLE (FLAGYL) 500 MG tablet Take 1 tablet (500 mg total) by mouth 2 (two) times daily.    ondansetron (ZOFRAN ODT) 4 MG disintegrating tablet Take 1 tablet (4 mg total) by mouth every 8 (eight) hours as needed for nausea or vomiting.    No facility-administered encounter medications on file as of 01/23/2023.     Past Medical History:  Diagnosis Date   Diabetes mellitus without complication (HCC)  GERD (gastroesophageal reflux disease)    History of gastritis 09/2016   Hypertension     Past Surgical History:  Procedure Laterality Date   COLONOSCOPY WITH PROPOFOL N/A 11/15/2019   Procedure: COLONOSCOPY WITH PROPOFOL WITH POLYPECTOMY, BIOPSIES;  Surgeon: Midge Minium, MD;  Location: [facility];  Service: Endoscopy;  Laterality: N/A;   ESOPHAGOGASTRODUODENOSCOPY (EGD) WITH PROPOFOL N/A 11/15/2019   Procedure: ESOPHAGOGASTRODUODENOSCOPY (EGD) WITH PROPOFOL WITH POLYPECTOMY, BIOPSIES;  Surgeon: Midge Minium, MD;  Location: [facility];  Service: Endoscopy;  Laterality: N/A;   INCISION AND DRAINAGE ABSCESS  N/A 12/29/2022   Procedure: INCISION AND DEBRIDEMENT;  Surgeon: Crista Elliot, MD;  Location: [facility];  Service: Urology;  Laterality: N/A;   TOOTH EXTRACTION      Family History  Problem Relation Age of Onset   Diabetes Brother    Hypertension Brother    Diabetes Maternal Aunt    Hypertension Maternal Aunt    Cancer Maternal Aunt        pancreas   Diabetes Maternal Uncle    Hypertension Maternal Uncle    Cancer Maternal Uncle        bone   Diabetes Maternal Grandmother    Cancer Maternal Grandmother    Diabetes Maternal Grandfather    Cancer Maternal Uncle        lung   COPD Neg Hx    Heart disease Neg Hx    Stroke Neg Hx     Social History   Social History Narrative   Not on file     Review of Systems General: Denies fevers, chills, weight loss CV: Denies chest pain, shortness of breath, palpitations Skin: Open wounds on the posterior aspect of the scrotum with the lower half of the left testicle exposed and healing wounds in the perineum.  Physical Exam    01/23/2023   10:39 AM 01/16/2023   10:44 AM 01/06/2023    7:56 AM  Vitals with BMI  Height 6\' 1"  6\' 1"    Weight 220 lbs 10 oz 215 lbs   BMI 29.11 28.37   Systolic 141 137 161  Diastolic 92 70 74  Pulse 86 101 62    General:  No acute distress,  Alert and oriented, Non-Toxic, Normal speech and affect Integument as noted the patient has healing surgical wounds of the scrotum and perineum.  All wounds are clean and granulating nicely.   Assessment/Plan Fournier's gangrene: Patient has open wounds which are slowly healing.  He is anxious to have these closed.  There is a portion on the distal perineal wound which I believe is ready for primary closure and I believe that the wounds on the scrotum would heal faster if we applied tissue matrix such as myriad.  I have discussed this with him and he is anxious to proceed.  Will schedule him at the next available slot.  [name] 01/23/2023, 11:08 AM

## 2023-01-23 NOTE — Patient Instructions (Signed)
You have completed ceftriaxone . Picc line removed. Follow up with wound clinic and plastic surgery

## 2023-01-24 ENCOUNTER — Encounter (HOSPITAL_BASED_OUTPATIENT_CLINIC_OR_DEPARTMENT_OTHER): Payer: Self-pay | Admitting: Plastic Surgery

## 2023-01-24 ENCOUNTER — Other Ambulatory Visit: Payer: Self-pay

## 2023-01-24 ENCOUNTER — Telehealth: Payer: Self-pay | Admitting: Nurse Practitioner

## 2023-01-24 ENCOUNTER — Ambulatory Visit: Payer: 59 | Admitting: Physician Assistant

## 2023-01-24 NOTE — Telephone Encounter (Signed)
Called patient about switching appointment to MyChart Video Visit and patient was inquiring about saline solution for his wound.  Wanting to see if there is a prescription for this as he normally has these supplies sent to him.  Please advise.

## 2023-01-24 NOTE — Progress Notes (Deleted)
Acute Office Visit   Patient: Steve [name]   DOB: 1981-02-21   42 y.o. Male  MRN: [id number] Visit Date: 01/24/2023  Today's healthcare provider: Oswaldo Conroy Cesily Cuoco, PA-C  Introduced myself to the patient as a Secondary school teacher and provided education on APPs in clinical practice.    No chief complaint on file.  Subjective    HPI    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 He has a hx of uncontrolled DM - was sent in script for Libre CGM earlier this week  He has been going to apts with Plastic Surgery, ID and Wound care for follow up  Most recently was seen by ID yesterday- had PICC line removed in their office and has completed Ceftriaxone and flagyl regimen  He is using Clotrimazole for intertrigo management  He was also see by Plastics yesterday - they report open wounds which appear to be healing well and are scheduling him for tissue matrix application    Diabetes, Type 2 - Last A1c 13.5 - Medications: Appears to be taking Mounjaro at this time, appears to have been on insulin in hospital - unsure if this has been continued.  - Compliance: poor  - Checking BG at home: *** - Diet: *** - Exercise: *** - Eye exam: *** - Foot exam: *** - Microalbumin: *** - Statin: *** - PNA vaccine: *** - Denies symptoms of hypoglycemia, polyuria, polydipsia, numbness extremities, foot ulcers/trauma      Medications: Outpatient Medications Prior to Visit  Medication Sig   acetaminophen (TYLENOL) 325 MG tablet Take 2 tablets (650 mg total) by mouth every 6 (six) hours as needed for mild pain (or Fever >/= 101).   amLODipine (NORVASC) 5 MG tablet Take 1 tablet (5 mg total) by mouth daily.   Blood Glucose Monitoring Suppl DEVI 1 each by Does not apply route in the morning, at noon, and at bedtime. May substitute to any manufacturer covered by patient's insurance.   Clotrimazole 1 % OINT Apply 0.5 inches topically in the morning and at bedtime.  Apply to groin area   Continuous Glucose Sensor (FREESTYLE LIBRE 14 DAY SENSOR) MISC 1 Device by Does not apply route every 14 (fourteen) days.   dexlansoprazole (DEXILANT) 60 MG capsule TAKE 1 CAPSULE BY MOUTH EVERY DAY   Glucose Blood (BLOOD GLUCOSE TEST STRIPS) STRP 1 each by In Vitro route in the morning, at noon, and at bedtime. May substitute to any manufacturer covered by patient's insurance.   Lancet Device MISC 1 each by Does not apply route in the morning, at noon, and at bedtime. May substitute to any manufacturer covered by patient's insurance.   metroNIDAZOLE (FLAGYL) 500 MG tablet Take 1 tablet (500 mg total) by mouth 2 (two) times daily.   ondansetron (ZOFRAN ODT) 4 MG disintegrating tablet Take 1 tablet (4 mg total) by mouth every 8 (eight) hours as needed for nausea or vomiting.   polyethylene glycol (MIRALAX / GLYCOLAX) 17 g packet Take 17 g by mouth daily.   rosuvastatin (CRESTOR) 5 MG tablet Take 1 tablet (5 mg total) by mouth daily.   tirzepatide [facility]) 2.5 MG/0.5ML Pen Inject 2.5 mg into the skin once a week.   traZODone (DESYREL) 50 MG tablet Take 0.5 tablets (25 mg total) by mouth at bedtime as needed for sleep.   No facility-administered medications prior to visit.    Review of Systems  {Insert previous labs (  optional):23779} {See past labs  Heme  Chem  Endocrine  Serology  Results Review (optional):1}   Objective    There were no vitals taken for this visit. {Insert last BP/Wt (optional):23777}{See vitals history (optional):1}   Physical Exam    No results found for any visits on 01/24/23.  Assessment & Plan      No follow-ups on file.

## 2023-01-24 NOTE — Telephone Encounter (Signed)
Pt has an appointment on 01/30/2023 @ 8:40 am with Denny Peon, called patient and informed him to reach out to his surgeon or wound care.  Pt verbalized understanding.

## 2023-01-25 DIAGNOSIS — E1165 Type 2 diabetes mellitus with hyperglycemia: Secondary | ICD-10-CM | POA: Diagnosis not present

## 2023-01-25 DIAGNOSIS — E7030 Albinism, unspecified: Secondary | ICD-10-CM | POA: Diagnosis not present

## 2023-01-25 DIAGNOSIS — Z5181 Encounter for therapeutic drug level monitoring: Secondary | ICD-10-CM | POA: Diagnosis not present

## 2023-01-25 DIAGNOSIS — I10 Essential (primary) hypertension: Secondary | ICD-10-CM | POA: Diagnosis not present

## 2023-01-25 DIAGNOSIS — E785 Hyperlipidemia, unspecified: Secondary | ICD-10-CM | POA: Diagnosis not present

## 2023-01-25 DIAGNOSIS — A419 Sepsis, unspecified organism: Secondary | ICD-10-CM | POA: Diagnosis not present

## 2023-01-25 DIAGNOSIS — Z792 Long term (current) use of antibiotics: Secondary | ICD-10-CM | POA: Diagnosis not present

## 2023-01-25 DIAGNOSIS — I700 Atherosclerosis of aorta: Secondary | ICD-10-CM | POA: Diagnosis not present

## 2023-01-27 ENCOUNTER — Ambulatory Visit: Payer: 59 | Admitting: Physician Assistant

## 2023-01-27 ENCOUNTER — Encounter: Payer: Self-pay | Admitting: Physician Assistant

## 2023-01-27 VITALS — BP 159/101 | HR 96 | Ht 73.0 in | Wt 224.4 lb

## 2023-01-27 DIAGNOSIS — N493 Fournier gangrene: Secondary | ICD-10-CM

## 2023-01-27 MED ORDER — ONDANSETRON 4 MG PO TBDP: 4.0000 mg | ORAL_TABLET | Freq: Three times a day (TID) | ORAL | 0 refills | Status: DC | PRN

## 2023-01-27 MED ORDER — OXYCODONE HCL 5 MG PO TABS: 5.0000 mg | ORAL_TABLET | Freq: Three times a day (TID) | ORAL | 0 refills | Status: AC | PRN

## 2023-01-27 NOTE — H&P (View-Only) (Signed)
 Patient ID: Steve [name], male    DOB: 02-21-1981, 42 y.o.   MRN: [id number]  Chief Complaint  Patient presents with   Pre-op Exam      ICD-10-CM   1. Fournier's gangrene of scrotum  N49.3        History of Present Illness: Steve [name] is a 42 y.o.  male  with a history of Fournier's gangrene.  He presents for preoperative evaluation for upcoming procedure, debridement with primary closure and/or application of tissue matrix and possible incisional VAC, scheduled for 01/31/2023 with Dr.  Ladona Ridgel .  The patient has not had problems with anesthesia.  Previous surgery without complication.  He tells me that his A1c was quite elevated when he went to the ED in July, but since then he has been actively working towards better controlling his blood sugars.  He has been free of any nicotine containing products x 2 months.  He is on [facility] for diabetes, last dose was 01/19/2023.  Holding until after surgery.  Uses a CGM.  Denies any personal history of severe cardiac or pulmonary disease, cancer, or need for anticoagulation.  Denies any family history of blood clots or clotting disorder.  He goes to wound care clinic for his scrotal wounds and also has HHN.  Summary of Previous Visit: Patient was seen for consult Dr. Ladona Ridgel on 01/23/2023.  At that time, open wounds on the posterior aspect of the scrotum with lower half of left testicle exposed and healing wounds in the perineum appreciated on exam.  Discussed combination of primary closure as well as application of myriad for wounds that will not be closed and instead need to further heal via secondary intent.  Patient was understanding and agreeable to plan.  Reported that he is no longer smoking tobacco and that his blood sugars are well-controlled.  BP was elevated here in clinic today, but states that he has not taken his BP medication for the past couple of days because he was unaware that he had a refill.  Denies any cardiac  symptoms.  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  PMH Significant for: HTN, HLD, GERD, T2DM, and Fournier's gangrene s/p debridement 12/2022.   Past Medical History: Allergies: No Known Allergies  Current Medications:  Current Outpatient Medications:    acetaminophen (TYLENOL) 325 MG tablet, Take 2 tablets (650 mg total) by mouth every 6 (six) hours as needed for mild pain (or Fever >/= 101)., Disp: 20 tablet, Rfl: 0   amLODipine (NORVASC) 5 MG tablet, Take 1 tablet (5 mg total) by mouth daily., Disp: 30 tablet, Rfl: 2   Blood Glucose Monitoring Suppl DEVI, 1 each by Does not apply route in the morning, at noon, and at bedtime. May substitute to any manufacturer covered by patient's insurance., Disp: 1 each, Rfl: 0   Clotrimazole 1 % OINT, Apply 0.5 inches topically in the morning and at bedtime. Apply to groin area, Disp: 30 g, Rfl: 0   Continuous Glucose Sensor (FREESTYLE LIBRE 14 DAY SENSOR) MISC, 1 Device by Does not apply route every 14 (fourteen) days., Disp: 1 each, Rfl: 0   Glucose Blood (BLOOD GLUCOSE TEST STRIPS) STRP, 1 each by In Vitro route in the morning, at noon, and at bedtime. May substitute to any manufacturer covered by patient's insurance., Disp: 100 strip, Rfl: 0   Lancet Device MISC, 1 each by Does not apply route in the morning, at noon, and  at bedtime. May substitute to any manufacturer covered by patient's insurance., Disp: 1 each, Rfl: 0   tirzepatide (MOUNJARO) 2.5 MG/0.5ML Pen, Inject 2.5 mg into the skin once a week., Disp: 2 mL, Rfl: 0   dexlansoprazole (DEXILANT) 60 MG capsule, TAKE 1 CAPSULE BY MOUTH EVERY DAY (Patient not taking: Reported on 01/27/2023), Disp: 90 capsule, Rfl: 2   metroNIDAZOLE (FLAGYL) 500 MG tablet, Take 1 tablet (500 mg total) by mouth 2 (two) times daily. (Patient not taking: Reported on 01/27/2023), Disp: 14 tablet, Rfl: 0   ondansetron (ZOFRAN ODT) 4 MG disintegrating tablet, Take 1 tablet (4 mg total) by  mouth every 8 (eight) hours as needed for nausea or vomiting. (Patient not taking: Reported on 01/27/2023), Disp: 20 tablet, Rfl: 0   polyethylene glycol (MIRALAX / GLYCOLAX) 17 g packet, Take 17 g by mouth daily. (Patient not taking: Reported on 01/27/2023), Disp: 14 each, Rfl: 0   rosuvastatin (CRESTOR) 5 MG tablet, Take 1 tablet (5 mg total) by mouth daily. (Patient not taking: Reported on 01/27/2023), Disp: 90 tablet, Rfl: 1   traZODone (DESYREL) 50 MG tablet, Take 0.5 tablets (25 mg total) by mouth at bedtime as needed for sleep. (Patient not taking: Reported on 01/27/2023), Disp: 30 tablet, Rfl: 0  Past Medical Problems: Past Medical History:  Diagnosis Date   Diabetes mellitus without complication (HCC)    GERD (gastroesophageal reflux disease)    History of gastritis 09/2016   Hypertension     Past Surgical History: Past Surgical History:  Procedure Laterality Date   COLONOSCOPY WITH PROPOFOL N/A 11/15/2019   Procedure: COLONOSCOPY WITH PROPOFOL WITH POLYPECTOMY, BIOPSIES;  Surgeon: Midge Minium, MD;  Location: [facility];  Service: Endoscopy;  Laterality: N/A;   ESOPHAGOGASTRODUODENOSCOPY (EGD) WITH PROPOFOL N/A 11/15/2019   Procedure: ESOPHAGOGASTRODUODENOSCOPY (EGD) WITH PROPOFOL WITH POLYPECTOMY, BIOPSIES;  Surgeon: Midge Minium, MD;  Location: [facility];  Service: Endoscopy;  Laterality: N/A;   INCISION AND DRAINAGE ABSCESS N/A 12/29/2022   Procedure: INCISION AND DEBRIDEMENT;  Surgeon: Crista Elliot, MD;  Location: [facility];  Service: Urology;  Laterality: N/A;   TOOTH EXTRACTION      Social History: Social History   Socioeconomic History   Marital status: Single    Spouse name: Not on file   Number of children: Not on file   Years of education: Not on file   Highest education level: Not on file  Occupational History   Not on file  Tobacco Use   Smoking status: Former    Current packs/day: 0.25    Average packs/day: 0.3 packs/day for 23.0 years  (5.8 ttl pk-yrs)    Types: Cigarettes   Smokeless tobacco: Never   Tobacco comments:    since age 43; pt reports he quit 2 months ago (BMP, 01/27/23)  Vaping Use   Vaping status: Every Day  Substance and Sexual Activity   Alcohol use: No   Drug use: Yes    Types: Marijuana   Sexual activity: Yes  Other Topics Concern   Not on file  Social History Narrative   Not on file   Social Determinants of Health   Financial Resource Strain: Medium Risk (12/02/2022)   Overall Financial Resource Strain (CARDIA)    Difficulty of Paying Living Expenses: Somewhat hard  Food Insecurity: No Food Insecurity (01/07/2023)   Hunger Vital Sign    Worried About Running Out of Food in the Last Year: Never true    Ran Out of Food in the  Last Year: Never true  Transportation Needs: No Transportation Needs (01/07/2023)   PRAPARE - Administrator, Civil Service (Medical): No    Lack of Transportation (Non-Medical): No  Physical Activity: Sufficiently Active (12/02/2022)   Exercise Vital Sign    Days of Exercise per Week: 3 days    Minutes of Exercise per Session: 60 min  Stress: No Stress Concern Present (12/02/2022)   [employer] of Occupational Health - Occupational Stress Questionnaire    Feeling of Stress : Not at all  Social Connections: Socially Isolated (12/02/2022)   Social Connection and Isolation Panel [NHANES]    Frequency of Communication with Friends and Family: Once a week    Frequency of Social Gatherings with Friends and Family: Never    Attends Religious Services: Never    Database administrator or Organizations: Yes    Attends Banker Meetings: Never    Marital Status: Never married  Intimate Partner Violence: Not At Risk (12/29/2022)   Humiliation, Afraid, Rape, and Kick questionnaire    Fear of Current or Ex-Partner: No    Emotionally Abused: No    Physically Abused: No    Sexually Abused: No    Family History: Family History  Problem Relation Age of  Onset   Diabetes Brother    Hypertension Brother    Diabetes Maternal Aunt    Hypertension Maternal Aunt    Cancer Maternal Aunt        pancreas   Diabetes Maternal Uncle    Hypertension Maternal Uncle    Cancer Maternal Uncle        bone   Diabetes Maternal Grandmother    Cancer Maternal Grandmother    Diabetes Maternal Grandfather    Cancer Maternal Uncle        lung   COPD Neg Hx    Heart disease Neg Hx    Stroke Neg Hx     Review of Systems: ROS Denies any recent chest pain, difficulty breathing, leg swelling, or fevers.   Physical Exam: Vital Signs BP (!) 159/101 (BP Location: Left Arm, Patient Position: Sitting, Cuff Size: Large)   Pulse 96   Ht 6\' 1"  (1.854 m)   Wt 224 lb 6.4 oz (101.8 kg)   SpO2 98%   BMI 29.61 kg/m   Physical Exam  Constitutional:      General: Not in acute distress.    Appearance: Normal appearance. Not ill-appearing.  HENT:     Head: Normocephalic and atraumatic.  Eyes:     Pupils: Pupils are equal, round. Cardiovascular:     Rate and Rhythm: Normal rate.    Pulses: Normal pulses.  Pulmonary:     Effort: No respiratory distress or increased work of breathing.  Speaks in full sentences. Abdominal:     General: Abdomen is flat. No distension.   Musculoskeletal: Normal range of motion. No lower extremity swelling or edema. No varicosities.  Skin:    General: Skin is warm and dry.     Findings: No erythema or rash.  Neurological:     Mental Status: Alert and oriented to person, place, and time.  Psychiatric:        Mood and Affect: Mood normal.        Behavior: Behavior normal.    Assessment/Plan: The patient is scheduled for debridement with primary closure and/or application of tissue matrix and possible incisional VAC with Dr.  Ladona Ridgel .  Risks, benefits, and alternatives of procedure discussed, questions answered  and consent obtained.    Smoking Status: Former smoker.  Caprini Score: 4; Risk Factors include: Age, BMI  greater than 25, and length of planned surgery. Recommendation for mechanical prophylaxis. Encourage early ambulation.   Pictures obtained: 01/23/2023  Post-op Rx sent to pharmacy: Oxycodone and Zofran.  Discussed Tylenol and ibuprofen as primary modalities for pain control.  Patient was provided with the General Surgical Risk consent document and Pain Medication Agreement prior to their appointment.  They had adequate time to read through the risk consent documents and Pain Medication Agreement. We also discussed them in person together during this preop appointment. All of their questions were answered to their satisfaction.  Recommended calling if they have any further questions.  Risk consent form and Pain Medication Agreement to be scanned into patient's chart.    Electronically signed by: Evelena Leyden, PA-C 01/27/2023 2:29 PM

## 2023-01-27 NOTE — Progress Notes (Signed)
Patient ID: Steve [name], male    DOB: 02-21-1981, 42 y.o.   MRN: [id number]  Chief Complaint  Patient presents with   Pre-op Exam      ICD-10-CM   1. Fournier's gangrene of scrotum  N49.3        History of Present Illness: Steve [name] is a 42 y.o.  male  with a history of Fournier's gangrene.  He presents for preoperative evaluation for upcoming procedure, debridement with primary closure and/or application of tissue matrix and possible incisional VAC, scheduled for 01/31/2023 with Dr.  Ladona Ridgel .  The patient has not had problems with anesthesia.  Previous surgery without complication.  He tells me that his A1c was quite elevated when he went to the ED in July, but since then he has been actively working towards better controlling his blood sugars.  He has been free of any nicotine containing products x 2 months.  He is on [facility] for diabetes, last dose was 01/19/2023.  Holding until after surgery.  Uses a CGM.  Denies any personal history of severe cardiac or pulmonary disease, cancer, or need for anticoagulation.  Denies any family history of blood clots or clotting disorder.  He goes to wound care clinic for his scrotal wounds and also has HHN.  Summary of Previous Visit: Patient was seen for consult Dr. Ladona Ridgel on 01/23/2023.  At that time, open wounds on the posterior aspect of the scrotum with lower half of left testicle exposed and healing wounds in the perineum appreciated on exam.  Discussed combination of primary closure as well as application of myriad for wounds that will not be closed and instead need to further heal via secondary intent.  Patient was understanding and agreeable to plan.  Reported that he is no longer smoking tobacco and that his blood sugars are well-controlled.  BP was elevated here in clinic today, but states that he has not taken his BP medication for the past couple of days because he was unaware that he had a refill.  Denies any cardiac  symptoms.  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  PMH Significant for: HTN, HLD, GERD, T2DM, and Fournier's gangrene s/p debridement 12/2022.   Past Medical History: Allergies: No Known Allergies  Current Medications:  Current Outpatient Medications:    acetaminophen (TYLENOL) 325 MG tablet, Take 2 tablets (650 mg total) by mouth every 6 (six) hours as needed for mild pain (or Fever >/= 101)., Disp: 20 tablet, Rfl: 0   amLODipine (NORVASC) 5 MG tablet, Take 1 tablet (5 mg total) by mouth daily., Disp: 30 tablet, Rfl: 2   Blood Glucose Monitoring Suppl DEVI, 1 each by Does not apply route in the morning, at noon, and at bedtime. May substitute to any manufacturer covered by patient's insurance., Disp: 1 each, Rfl: 0   Clotrimazole 1 % OINT, Apply 0.5 inches topically in the morning and at bedtime. Apply to groin area, Disp: 30 g, Rfl: 0   Continuous Glucose Sensor (FREESTYLE LIBRE 14 DAY SENSOR) MISC, 1 Device by Does not apply route every 14 (fourteen) days., Disp: 1 each, Rfl: 0   Glucose Blood (BLOOD GLUCOSE TEST STRIPS) STRP, 1 each by In Vitro route in the morning, at noon, and at bedtime. May substitute to any manufacturer covered by patient's insurance., Disp: 100 strip, Rfl: 0   Lancet Device MISC, 1 each by Does not apply route in the morning, at noon, and  at bedtime. May substitute to any manufacturer covered by patient's insurance., Disp: 1 each, Rfl: 0   tirzepatide (MOUNJARO) 2.5 MG/0.5ML Pen, Inject 2.5 mg into the skin once a week., Disp: 2 mL, Rfl: 0   dexlansoprazole (DEXILANT) 60 MG capsule, TAKE 1 CAPSULE BY MOUTH EVERY DAY (Patient not taking: Reported on 01/27/2023), Disp: 90 capsule, Rfl: 2   metroNIDAZOLE (FLAGYL) 500 MG tablet, Take 1 tablet (500 mg total) by mouth 2 (two) times daily. (Patient not taking: Reported on 01/27/2023), Disp: 14 tablet, Rfl: 0   ondansetron (ZOFRAN ODT) 4 MG disintegrating tablet, Take 1 tablet (4 mg total) by  mouth every 8 (eight) hours as needed for nausea or vomiting. (Patient not taking: Reported on 01/27/2023), Disp: 20 tablet, Rfl: 0   polyethylene glycol (MIRALAX / GLYCOLAX) 17 g packet, Take 17 g by mouth daily. (Patient not taking: Reported on 01/27/2023), Disp: 14 each, Rfl: 0   rosuvastatin (CRESTOR) 5 MG tablet, Take 1 tablet (5 mg total) by mouth daily. (Patient not taking: Reported on 01/27/2023), Disp: 90 tablet, Rfl: 1   traZODone (DESYREL) 50 MG tablet, Take 0.5 tablets (25 mg total) by mouth at bedtime as needed for sleep. (Patient not taking: Reported on 01/27/2023), Disp: 30 tablet, Rfl: 0  Past Medical Problems: Past Medical History:  Diagnosis Date   Diabetes mellitus without complication (HCC)    GERD (gastroesophageal reflux disease)    History of gastritis 09/2016   Hypertension     Past Surgical History: Past Surgical History:  Procedure Laterality Date   COLONOSCOPY WITH PROPOFOL N/A 11/15/2019   Procedure: COLONOSCOPY WITH PROPOFOL WITH POLYPECTOMY, BIOPSIES;  Surgeon: Midge Minium, MD;  Location: [facility];  Service: Endoscopy;  Laterality: N/A;   ESOPHAGOGASTRODUODENOSCOPY (EGD) WITH PROPOFOL N/A 11/15/2019   Procedure: ESOPHAGOGASTRODUODENOSCOPY (EGD) WITH PROPOFOL WITH POLYPECTOMY, BIOPSIES;  Surgeon: Midge Minium, MD;  Location: [facility];  Service: Endoscopy;  Laterality: N/A;   INCISION AND DRAINAGE ABSCESS N/A 12/29/2022   Procedure: INCISION AND DEBRIDEMENT;  Surgeon: Crista Elliot, MD;  Location: [facility];  Service: Urology;  Laterality: N/A;   TOOTH EXTRACTION      Social History: Social History   Socioeconomic History   Marital status: Single    Spouse name: Not on file   Number of children: Not on file   Years of education: Not on file   Highest education level: Not on file  Occupational History   Not on file  Tobacco Use   Smoking status: Former    Current packs/day: 0.25    Average packs/day: 0.3 packs/day for 23.0 years  (5.8 ttl pk-yrs)    Types: Cigarettes   Smokeless tobacco: Never   Tobacco comments:    since age 43; pt reports he quit 2 months ago (BMP, 01/27/23)  Vaping Use   Vaping status: Every Day  Substance and Sexual Activity   Alcohol use: No   Drug use: Yes    Types: Marijuana   Sexual activity: Yes  Other Topics Concern   Not on file  Social History Narrative   Not on file   Social Determinants of Health   Financial Resource Strain: Medium Risk (12/02/2022)   Overall Financial Resource Strain (CARDIA)    Difficulty of Paying Living Expenses: Somewhat hard  Food Insecurity: No Food Insecurity (01/07/2023)   Hunger Vital Sign    Worried About Running Out of Food in the Last Year: Never true    Ran Out of Food in the  Last Year: Never true  Transportation Needs: No Transportation Needs (01/07/2023)   PRAPARE - Administrator, Civil Service (Medical): No    Lack of Transportation (Non-Medical): No  Physical Activity: Sufficiently Active (12/02/2022)   Exercise Vital Sign    Days of Exercise per Week: 3 days    Minutes of Exercise per Session: 60 min  Stress: No Stress Concern Present (12/02/2022)   [employer] of Occupational Health - Occupational Stress Questionnaire    Feeling of Stress : Not at all  Social Connections: Socially Isolated (12/02/2022)   Social Connection and Isolation Panel [NHANES]    Frequency of Communication with Friends and Family: Once a week    Frequency of Social Gatherings with Friends and Family: Never    Attends Religious Services: Never    Database administrator or Organizations: Yes    Attends Banker Meetings: Never    Marital Status: Never married  Intimate Partner Violence: Not At Risk (12/29/2022)   Humiliation, Afraid, Rape, and Kick questionnaire    Fear of Current or Ex-Partner: No    Emotionally Abused: No    Physically Abused: No    Sexually Abused: No    Family History: Family History  Problem Relation Age of  Onset   Diabetes Brother    Hypertension Brother    Diabetes Maternal Aunt    Hypertension Maternal Aunt    Cancer Maternal Aunt        pancreas   Diabetes Maternal Uncle    Hypertension Maternal Uncle    Cancer Maternal Uncle        bone   Diabetes Maternal Grandmother    Cancer Maternal Grandmother    Diabetes Maternal Grandfather    Cancer Maternal Uncle        lung   COPD Neg Hx    Heart disease Neg Hx    Stroke Neg Hx     Review of Systems: ROS Denies any recent chest pain, difficulty breathing, leg swelling, or fevers.   Physical Exam: Vital Signs BP (!) 159/101 (BP Location: Left Arm, Patient Position: Sitting, Cuff Size: Large)   Pulse 96   Ht 6\' 1"  (1.854 m)   Wt 224 lb 6.4 oz (101.8 kg)   SpO2 98%   BMI 29.61 kg/m   Physical Exam  Constitutional:      General: Not in acute distress.    Appearance: Normal appearance. Not ill-appearing.  HENT:     Head: Normocephalic and atraumatic.  Eyes:     Pupils: Pupils are equal, round. Cardiovascular:     Rate and Rhythm: Normal rate.    Pulses: Normal pulses.  Pulmonary:     Effort: No respiratory distress or increased work of breathing.  Speaks in full sentences. Abdominal:     General: Abdomen is flat. No distension.   Musculoskeletal: Normal range of motion. No lower extremity swelling or edema. No varicosities.  Skin:    General: Skin is warm and dry.     Findings: No erythema or rash.  Neurological:     Mental Status: Alert and oriented to person, place, and time.  Psychiatric:        Mood and Affect: Mood normal.        Behavior: Behavior normal.    Assessment/Plan: The patient is scheduled for debridement with primary closure and/or application of tissue matrix and possible incisional VAC with Dr.  Ladona Ridgel .  Risks, benefits, and alternatives of procedure discussed, questions answered  and consent obtained.    Smoking Status: Former smoker.  Caprini Score: 4; Risk Factors include: Age, BMI  greater than 25, and length of planned surgery. Recommendation for mechanical prophylaxis. Encourage early ambulation.   Pictures obtained: 01/23/2023  Post-op Rx sent to pharmacy: Oxycodone and Zofran.  Discussed Tylenol and ibuprofen as primary modalities for pain control.  Patient was provided with the General Surgical Risk consent document and Pain Medication Agreement prior to their appointment.  They had adequate time to read through the risk consent documents and Pain Medication Agreement. We also discussed them in person together during this preop appointment. All of their questions were answered to their satisfaction.  Recommended calling if they have any further questions.  Risk consent form and Pain Medication Agreement to be scanned into patient's chart.    Electronically signed by: Evelena Leyden, PA-C 01/27/2023 2:29 PM

## 2023-01-28 ENCOUNTER — Encounter: Payer: 59 | Admitting: Internal Medicine

## 2023-01-28 DIAGNOSIS — E11622 Type 2 diabetes mellitus with other skin ulcer: Secondary | ICD-10-CM | POA: Diagnosis not present

## 2023-01-28 DIAGNOSIS — F17200 Nicotine dependence, unspecified, uncomplicated: Secondary | ICD-10-CM | POA: Diagnosis not present

## 2023-01-28 DIAGNOSIS — I10 Essential (primary) hypertension: Secondary | ICD-10-CM | POA: Diagnosis not present

## 2023-01-28 DIAGNOSIS — L98498 Non-pressure chronic ulcer of skin of other sites with other specified severity: Secondary | ICD-10-CM | POA: Diagnosis not present

## 2023-01-28 DIAGNOSIS — N493 Fournier gangrene: Secondary | ICD-10-CM | POA: Diagnosis not present

## 2023-01-28 DIAGNOSIS — Z794 Long term (current) use of insulin: Secondary | ICD-10-CM | POA: Diagnosis not present

## 2023-01-28 DIAGNOSIS — T8189XA Other complications of procedures, not elsewhere classified, initial encounter: Secondary | ICD-10-CM | POA: Diagnosis not present

## 2023-01-28 NOTE — Progress Notes (Signed)
Steve [name] ([id number]) [id number] Page 1 of 5 Visit Report for 01/28/2023 HPI Details Patient Name: Date of Service: Steve [name] [facility] [name] 01/28/2023 3:15 PM Medical Record Number: [id number] Patient Account Number: [id number] Date of Birth/Sex: Treating RN: [birth year] (42 y.o. Steve [name]) Yevonne Pax Primary Care Provider: Larae Grooms Other Clinician: Referring Provider: Treating Provider/Extender: RO BSO N, MICHA EL Mallie Mussel in Treatment: 2 History of Present Illness HPI Description: 01-13-2023 upon evaluation today patient presents for a significant issue here regarding an issue where he had a Fournier gangrene which subsequently was manage inpatient. He ended up having to have obviously surgical intervention and unfortunately post intervention he does have now a significant wound which is in the perineal area which actually includes a portion of the scrotum as well that has been actually surgically removed and what appeared. Subsequently the testes are actually exposed to a degree and again this seems to be something that is more of a surgical things than just a wound. Thanks to me. I do not see a way that we can just get this to heal by second intent. I think that he likely needs to see plastic surgery as soon as possible. I discussed that with the patient today and this is what the surgeon and the PA with the surgeon that I been discussing this case with as well has recommended but apparently plastic surgery wanted the patient seen by wound care first before they would proceed with any type of surgical intervention. The patient is currently on a Dexcom although he previously did have a significant elevation in his blood sugars back in July on the 28 2024 his A1c was 13.5. With that being said that is being worked on trying to get this under control. He does have a history again obviously of diabetes mellitus type 2, hypertension, and  more recently the Fournier gangrene. The patient's wound actually started January 03, 2023 as a small abscess that then actually exponentially worsened. 01-21-2023 upon evaluation today patient appears to be doing actually better than what expected is regard to the wound. He actually does have an appointment with a plastic surgeon in coming days with: Plastic surgery. This is good news. Again the obvious goal is to try to see what can be done to get this close much more effectively and appropriately than just by second intent. The good news is he actually appears to have a significant mount of hypergranulation tissue over top of the testes as well as the area in general seems to want a healing. This is just taking a very long time unfortunately. I am also not certain that this is actually appropriately healing and the way that this is happening but again I am not sure what can be done differently hence the reason that we really want to see if there is a plastic surgery option towards getting this closed faster and more appropriately. 8/27; this is a patient who had Fournier's gangrene involving the scrotum extending into the surrounding soft tissue. Using wet-to-dry dressings that are being changed daily. We are making good progress. He is also followed by plastic surgery I think they are going to try to sew up part of the lower perineum but they are leaving the area over the testicle open to here heal via secondary intention Electronic Signature(s) Signed: 01/28/2023 5:06:52 PM By: Baltazar Najjar MD Entered By: Baltazar Najjar on 01/28/2023 15:57:11 -------------------------------------------------------------------------------- Physical Exam Details Patient Name: Date of Service: Steve [name],  [facility] [name] 01/28/2023 3:15 PM Medical Record Number: [id number] Patient Account Number: [id number] Date of Birth/Sex: Treating RN: Aug 23, 1980 (42 y.o. Steve [name]) Yevonne Pax Primary Care Provider: Larae Grooms  Other Clinician: Referring Provider: Treating Provider/Extender: RO BSO Dorris Carnes, MICHA EL Jones Skene, Kellie Shropshire in Treatment: 2 Constitutional CHAN, BOGGS [name] ([id number]) [id number] Page 2 of 5 Patient is hypertensive.. Pulse regular and within target range for patient.Steve [name] Respirations regular, non-labored and within target range.. Temperature is normal and within the target range for the patient.Steve [name] appears in no distress. Notes Wound exam; extensive probably surgical wound over the right hemiscrotum extending down into the perineum. Granulation tissue looks healthy. Our intake nurse points out that there was undermining on the right side of this open area but it is filled in nicely with granulation tissue. Furthermore not the testicles are not fully exposed they are granulating as well. Fortunately there is no evidence of infection Electronic Signature(s) Signed: 01/28/2023 5:06:52 PM By: Baltazar Najjar MD Entered By: Baltazar Najjar on 01/28/2023 15:58:15 -------------------------------------------------------------------------------- Physician Orders Details Patient Name: Date of Service: Steve Proud [name] 01/28/2023 3:15 PM Medical Record Number: [id number] Patient Account Number: [id number] Date of Birth/Sex: Treating RN: Apr 23, 1981 (42 y.o. [name] Primary Care Provider: Larae Grooms Other Clinician: Referring Provider: Treating Provider/Extender: RO BSO N, MICHA EL Mallie Mussel in Treatment: 2 Verbal / Phone Orders: No Diagnosis Coding Follow-up Appointments Return Appointment in 1 week. Bathing/ Shower/ Hygiene Clean wound with Normal Saline or wound cleanser. No tub bath. Wound Treatment Wound #1 - Scrotum Cleanser: Normal Saline 1 x Per Day/30 Days Discharge Instructions: Wash your hands with soap and water. Remove old dressing, discard into plastic bag and place into trash. Cleanse the wound with Normal Saline  prior to applying a clean dressing using gauze sponges, not tissues or cotton balls. Do not scrub or use excessive force. Pat dry using gauze sponges, not tissue or cotton balls. Prim Dressing: Gauze 1 x Per Day/30 Days ary Discharge Instructions: moistened with saline Secondary Dressing: ABD Pad 5x9 (in/in) 1 x Per Day/30 Days Discharge Instructions: Cover with ABD pad Electronic Signature(s) Signed: 01/28/2023 3:50:59 PM By: Yevonne Pax RN Signed: 01/28/2023 5:06:52 PM By: Baltazar Najjar MD Entered By: Yevonne Pax on 01/28/2023 15:50:58 [name] ([id number]) [id number] Page 3 of 5 -------------------------------------------------------------------------------- Problem List Details Patient Name: Date of Service: Pollyann Savoy 01/28/2023 3:15 PM Medical Record Number: [id number] Patient Account Number: [id number] Date of Birth/Sex: Treating RN: [birth year] (41 y.o. Steve [name]) Yevonne Pax Primary Care Provider: Larae Grooms Other Clinician: Referring Provider: Treating Provider/Extender: RO BSO N, MICHA EL Jones Skene, Kellie Shropshire in Treatment: 2 Active Problems ICD-10 Encounter Code Description Active Date MDM Diagnosis N49.3 Fournier gangrene 01/13/2023 No Yes L98.498 Non-pressure chronic ulcer of skin of other sites with other specified severity 01/13/2023 No Yes E11.622 Type 2 diabetes mellitus with other skin ulcer 01/13/2023 No Yes I10 Essential (primary) hypertension 01/13/2023 No Yes Inactive Problems Resolved Problems Electronic Signature(s) Signed: 01/28/2023 5:06:52 PM By: Baltazar Najjar MD Entered By: Baltazar Najjar on 01/28/2023 15:55:31 -------------------------------------------------------------------------------- Progress Note Details Patient Name: Date of Service: Steve Proud [name] 01/28/2023 3:15 PM Medical Record Number: [id number] Patient Account Number: [id number] Date of Birth/Sex: Treating RN: 22-May-1981 (41  y.o. [name] Primary Care Provider: Larae Grooms Other Clinician: Referring Provider: Treating Provider/Extender: RO BSO Dorris Carnes, MICHA EL Mallie Mussel in Treatment: 2 Corwin Levins [name] ([id number]) [id number] Page 4  of 5 Subjective History of Present Illness (HPI) 01-13-2023 upon evaluation today patient presents for a significant issue here regarding an issue where he had a Fournier gangrene which subsequently was manage inpatient. He ended up having to have obviously surgical intervention and unfortunately post intervention he does have now a significant wound which is in the perineal area which actually includes a portion of the scrotum as well that has been actually surgically removed and what appeared. Subsequently the testes are actually exposed to a degree and again this seems to be something that is more of a surgical things than just a wound. Thanks to me. I do not see a way that we can just get this to heal by second intent. I think that he likely needs to see plastic surgery as soon as possible. I discussed that with the patient today and this is what the surgeon and the PA with the surgeon that I been discussing this case with as well has recommended but apparently plastic surgery wanted the patient seen by wound care first before they would proceed with any type of surgical intervention. The patient is currently on a Dexcom although he previously did have a significant elevation in his blood sugars back in July on the 28 2024 his A1c was 13.5. With that being said that is being worked on trying to get this under control. He does have a history again obviously of diabetes mellitus type 2, hypertension, and more recently the Fournier gangrene. The patient's wound actually started January 03, 2023 as a small abscess that then actually exponentially worsened. 01-21-2023 upon evaluation today patient appears to be doing actually better than  what expected is regard to the wound. He actually does have an appointment with a plastic surgeon in coming days with: Plastic surgery. This is good news. Again the obvious goal is to try to see what can be done to get this close much more effectively and appropriately than just by second intent. The good news is he actually appears to have a significant mount of hypergranulation tissue over top of the testes as well as the area in general seems to want a healing. This is just taking a very long time unfortunately. I am also not certain that this is actually appropriately healing and the way that this is happening but again I am not sure what can be done differently hence the reason that we really want to see if there is a plastic surgery option towards getting this closed faster and more appropriately. 8/27; this is a patient who had Fournier's gangrene involving the scrotum extending into the surrounding soft tissue. Using wet-to-dry dressings that are being changed daily. We are making good progress. He is also followed by plastic surgery I think they are going to try to sew up part of the lower perineum but they are leaving the area over the testicle open to here heal via secondary intention Objective Constitutional Patient is hypertensive.. Pulse regular and within target range for patient.Steve [name] Respirations regular, non-labored and within target range.. Temperature is normal and within the target range for the patient.Steve [name] appears in no distress. Vitals Time Taken: 2:59 PM, Height: 74 in, Weight: 230 lbs, BMI: 29.5, Temperature: 98.3 F, Pulse: 96 bpm, Respiratory Rate: 18 breaths/min, Blood Pressure: 171/80 mmHg. General Notes: Wound exam; extensive probably surgical wound over the right hemiscrotum extending down into the perineum. Granulation tissue looks healthy. Our intake nurse points out that there was undermining on the right side of this  open area but it is filled in nicely with granulation  tissue. Furthermore not the testicles are not fully exposed they are granulating as well. Fortunately there is no evidence of infection Integumentary (Hair, Skin) Wound #1 status is Open. Original cause of wound was Gradually Appeared. The date acquired was: 01/03/2023. The wound has been in treatment 2 weeks. The wound is located on the Scrotum. The wound measures 9.5cm length x 5cm width x 1cm depth; 37.306cm^2 area and 37.306cm^3 volume. There is Fat Layer (Subcutaneous Tissue) exposed. There is no tunneling or undermining noted. There is a medium amount of serosanguineous drainage noted. There is large (67- 100%) red granulation within the wound bed. There is a small (1-33%) amount of necrotic tissue within the wound bed including Adherent Slough. 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 Bathing/ Shower/ Hygiene: Clean wound with Normal Saline or wound cleanser. No tub bath. WOUND #1: - Scrotum Wound Laterality: Cleanser: Normal Saline 1 x Per Day/30 Days Discharge Instructions: Wash your hands with soap and water. Remove old dressing, discard into plastic bag and place into trash. Cleanse the wound with Normal Saline prior to applying a clean dressing using gauze sponges, not tissues or cotton balls. Do not scrub or use excessive force. Pat dry using gauze sponges, not tissue or cotton balls. Prim Dressing: Gauze 1 x Per Day/30 Days ary Discharge Instructions: moistened with saline Secondary Dressing: ABD Pad 5x9 (in/in) 1 x Per Day/30 Days DANNELL, LETSCH [name] ([id number]) [id number] Page 5 of 5 Discharge Instructions: Cover with ABD pad 1. Continue with wet-to-dry dressings 2. He is having plastic surgery I think on the lower  part of his perineum we will see him back in 2 weeks. 3. Fortunately no evidence of infection Electronic Signature(s) Signed: 01/28/2023 5:06:52 PM By: Baltazar Najjar MD Entered By: Baltazar Najjar on 01/28/2023 16:00:07 -------------------------------------------------------------------------------- SuperBill Details Patient Name: Date of Service: Steve Proud [name] 01/28/2023 Medical Record Number: [id number] Patient Account Number: [id number] Date of Birth/Sex: Treating RN: 05-Nov-1980 (41 y.o. [name] Primary Care Provider: Larae Grooms Other Clinician: Referring Provider: Treating Provider/Extender: RO BSO N, MICHA EL Mallie Mussel in Treatment: 2 Diagnosis Coding ICD-10 Codes Code Description N49.3 Fournier gangrene L98.498 Non-pressure chronic ulcer of skin of other sites with other specified severity E11.622 Type 2 diabetes mellitus with other skin ulcer I10 Essential (primary) hypertension Facility Procedures : CPT4 Code: [id number] Description: 96295 - WOUND CARE VISIT-LEV 2 EST PT Modifier: Quantity: 1 Physician Procedures : CPT4 Code Description Modifier [id number] - WC PHYS LEVEL 3 - EST PT ICD-10 Diagnosis Description N49.3 Fournier gangrene L98.498 Non-pressure chronic ulcer of skin of other sites with other specified severity Quantity: 1 Electronic Signature(s) Signed: 01/28/2023 5:06:52 PM By: Baltazar Najjar MD Previous Signature: 01/28/2023 3:51:40 PM Version By: Yevonne Pax RN Entered By: Baltazar Najjar on 01/28/2023 16:00:31

## 2023-01-30 ENCOUNTER — Ambulatory Visit (INDEPENDENT_AMBULATORY_CARE_PROVIDER_SITE_OTHER): Payer: 59 | Admitting: Physician Assistant

## 2023-01-30 ENCOUNTER — Encounter: Payer: Self-pay | Admitting: Physician Assistant

## 2023-01-30 VITALS — BP 153/100 | HR 90 | Temp 97.9°F | Wt 220.8 lb

## 2023-01-30 DIAGNOSIS — E1159 Type 2 diabetes mellitus with other circulatory complications: Secondary | ICD-10-CM

## 2023-01-30 DIAGNOSIS — Z7985 Long-term (current) use of injectable non-insulin antidiabetic drugs: Secondary | ICD-10-CM | POA: Diagnosis not present

## 2023-01-30 DIAGNOSIS — I10 Essential (primary) hypertension: Secondary | ICD-10-CM

## 2023-01-30 DIAGNOSIS — E1165 Type 2 diabetes mellitus with hyperglycemia: Secondary | ICD-10-CM | POA: Diagnosis not present

## 2023-01-30 DIAGNOSIS — N493 Fournier gangrene: Secondary | ICD-10-CM

## 2023-01-30 DIAGNOSIS — I152 Hypertension secondary to endocrine disorders: Secondary | ICD-10-CM | POA: Diagnosis not present

## 2023-01-30 MED ORDER — TIRZEPATIDE 2.5 MG/0.5ML ~~LOC~~ SOAJ
2.5000 mg | SUBCUTANEOUS | 1 refills | Status: DC
Start: 2023-01-30 — End: 2023-02-27

## 2023-01-30 MED ORDER — FREESTYLE LIBRE 3 SENSOR MISC
1.0000 | 3 refills | Status: DC
Start: 2023-01-30 — End: 2023-02-27

## 2023-01-30 NOTE — Progress Notes (Signed)
Acute Office Visit   Patient: Steve [name]   DOB: [birth year]   42 y.o. Adult  MRN: [id number] Visit Date: 01/30/2023  Today's healthcare provider: Oswaldo Conroy Laquilla Dault, PA-C  Introduced myself to the patient as a Secondary school teacher and provided education on APPs in clinical practice.    Chief Complaint  Patient presents with   Medication Management    Pt is requesting the librae 4   Subjective    HPI HPI     Medication Management    Additional comments: Pt is requesting the librae 4      Last edited by Andre Lefort, CMA on 01/30/2023  8:55 AM.        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 He has a hx of uncontrolled DM - was sent in script for Wright CGM earlier this week  He has been going to apts with Plastic Surgery, ID and Wound care for follow up  Most recently was seen by ID on 01/23/23 - had PICC line removed in their office and has completed Ceftriaxone and flagyl regimen  He is using Clotrimazole for intertrigo management  He was also see by Plastics 01/27/23 - they report open wounds which appear to be healing well and are scheduling him for tissue matrix application - surgery is tomorrow He was seen by Wound care on 01/28/23 - reports healthy granulation tissue and well healing wound - recommend he cleans wound with normal saline or wound cleanser   Diabetes, Type 2 - Last A1c 13.5 Reviewed most recent glucose levels- seem to holding steady in 130s-170s at this time  - Medications: Appears to be taking Mounjaro at this time, appears to have been on insulin in hospital. He is taking Mounjaro right now and reports he is tolerating well  - Compliance: poor  - Checking BG at home: has [employer]  - have been less than 200 after getting out of the hospital  - Eye exam: Reviewed importance of annual screening  - Foot exam: UTD - Microalbumin: UTD - Statin: not taking at this time - refills available- he has stopped  taking due to surgery  - PNA vaccine: NA   Fournier's Gangrene Reports this is healing well  He is ready for surgery tomorrow morning He is seeing home health nurse for management as well   HTN He ran out but is aware of refills available on Amlodipine  Will restart this today       Medications: Outpatient Medications Prior to Visit  Medication Sig   acetaminophen (TYLENOL) 325 MG tablet Take 2 tablets (650 mg total) by mouth every 6 (six) hours as needed for mild pain (or Fever >/= 101).   amLODipine (NORVASC) 5 MG tablet Take 1 tablet (5 mg total) by mouth daily.   Blood Glucose Monitoring Suppl DEVI 1 each by Does not apply route in the morning, at noon, and at bedtime. May substitute to any manufacturer covered by patient's insurance.   Clotrimazole 1 % OINT Apply 0.5 inches topically in the morning and at bedtime. Apply to groin area   Glucose Blood (BLOOD GLUCOSE TEST STRIPS) STRP 1 each by In Vitro route in the morning, at noon, and at bedtime. May substitute to any manufacturer covered by patient's insurance.   Lancet Device MISC 1 each by Does not apply route in the morning, at noon, and at bedtime. May substitute to any manufacturer  covered by patient's insurance.   ondansetron (ZOFRAN-ODT) 4 MG disintegrating tablet Take 1 tablet (4 mg total) by mouth every 8 (eight) hours as needed for nausea or vomiting.   [EXPIRED] oxyCODONE (ROXICODONE) 5 MG immediate release tablet Take 1 tablet (5 mg total) by mouth every 8 (eight) hours as needed for up to 5 days for severe pain.   [DISCONTINUED] Continuous Glucose Sensor (FREESTYLE LIBRE 14 DAY SENSOR) MISC 1 Device by Does not apply route every 14 (fourteen) days.   [DISCONTINUED] tirzepatide [facility]) 2.5 MG/0.5ML Pen Inject 2.5 mg into the skin once a week.   dexlansoprazole (DEXILANT) 60 MG capsule TAKE 1 CAPSULE BY MOUTH EVERY DAY (Patient not taking: Reported on 01/27/2023)   metroNIDAZOLE (FLAGYL) 500 MG tablet Take 1 tablet  (500 mg total) by mouth 2 (two) times daily. (Patient not taking: Reported on 01/27/2023)   polyethylene glycol (MIRALAX / GLYCOLAX) 17 g packet Take 17 g by mouth daily. (Patient not taking: Reported on 01/27/2023)   rosuvastatin (CRESTOR) 5 MG tablet Take 1 tablet (5 mg total) by mouth daily. (Patient not taking: Reported on 01/27/2023)   traZODone (DESYREL) 50 MG tablet Take 0.5 tablets (25 mg total) by mouth at bedtime as needed for sleep. (Patient not taking: Reported on 01/27/2023)   No facility-administered medications prior to visit.    Review of Systems  Respiratory:  Negative for shortness of breath.   Cardiovascular:  Negative for chest pain, palpitations and leg swelling.  Gastrointestinal:  Negative for diarrhea and nausea.  Skin:  Positive for wound.  Neurological:  Negative for dizziness, facial asymmetry and headaches.        Objective    BP (!) 153/100   Pulse 90   Temp 97.9 F (36.6 C) (Oral)   Wt 220 lb 12.8 oz (100.2 kg)   SpO2 98%   BMI 29.13 kg/m     Physical Exam Vitals reviewed.  Constitutional:      General: He is awake.     Appearance: Normal appearance. He is well-developed and well-groomed.  HENT:     Head: Normocephalic and atraumatic.  Cardiovascular:     Rate and Rhythm: Normal rate and regular rhythm.     Heart sounds: Normal heart sounds. No murmur heard.    No friction rub. No gallop.  Pulmonary:     Effort: Pulmonary effort is normal.  Neurological:     Mental Status: He is alert.  Psychiatric:        Attention and Perception: Attention and perception normal.        Mood and Affect: Mood and affect normal.        Speech: Speech normal.        Behavior: Behavior normal. Behavior is cooperative.       No results found for any visits on 01/30/23.  Assessment & Plan      Return in about 4 weeks (around 02/27/2023) for surgery follow up, DM monitoring.      Problem List Items Addressed This Visit       Cardiovascular and  Mediastinum   Hypertension associated with diabetes (HCC)    Chronic, historic condition Patient reports that he ran out of amlodipine but he is now aware that there are refills available and he will go pick this up today BP is elevated in office but suspect that this is likely due to him not taking his blood pressure medication Recommend restarting his regimen comprised of amlodipine 5 mg p.o. daily Follow-up in  about 4 weeks for monitoring       Relevant Medications   tirzepatide (MOUNJARO) 2.5 MG/0.5ML Pen   Essential hypertension     Endocrine   Type 2 diabetes mellitus with hyperglycemia, without long-term current use of insulin (HCC) - Primary    Chronic, historic condition Patient's diabetes was previously uncontrolled as his previous A1c was 13.5 Since he was hospitalized for Fournier's gangrene he has been monitoring his glucose levels and reports that these have dramatically improved.  He states that they are around 130-170 He has been using a freestyle libre for monitoring and requests refills of this Will provide prescription for freestyle libre 3 to aid with monitoring He has been taking his Mounjaro 2.5 weekly injection.  We discussed that he will likely have to increase the dose of this to properly manage his diabetes but we will recheck his A1c at a later follow-up and make dosing decisions at that time Recommend follow-up in 4 weeks to assess regimen adherence and for monitoring      Relevant Medications   tirzepatide (MOUNJARO) 2.5 MG/0.5ML Pen   Continuous Glucose Sensor (FREESTYLE LIBRE 3 SENSOR) MISC     Genitourinary   Fournier's gangrene of scrotum    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 He has been going to apts with Plastic Surgery, ID and Wound care for follow up  Most recently was seen by ID on 01/23/23 - had PICC line removed in their office and has completed Ceftriaxone and flagyl regimen  He is using  Clotrimazole for intertrigo management  He was also see by Plastics 01/27/23 - they report open wounds which appear to be healing well and are scheduling him for tissue matrix application - surgery is tomorrow He was seen by Wound care on 01/28/23 - reports healthy granulation tissue and well healing wound - recommend he cleans wound with normal saline or wound cleanser  Recommend close surveillance with PCP office as well as specialties to ensure proper recovery as well as manage his diabetes to prevent further complications. Recommend follow-up in about 4 weeks to discuss surgery results make sure that he is recovering well        Return in about 4 weeks (around 02/27/2023) for surgery follow up, DM monitoring.   I, Arvel Oquinn E Daviyon Widmayer, PA-C, have reviewed all documentation for this visit. The documentation on 02/05/23 for the exam, diagnosis, procedures, and orders are all accurate and complete.   Jacquelin Hawking, MHS, PA-C [facility]

## 2023-01-31 ENCOUNTER — Ambulatory Visit (HOSPITAL_BASED_OUTPATIENT_CLINIC_OR_DEPARTMENT_OTHER)
Admission: RE | Admit: 2023-01-31 | Discharge: 2023-01-31 | Disposition: A | Discharge status: Home / Self Care | Payer: 59 | Attending: Plastic Surgery | Admitting: Plastic Surgery

## 2023-01-31 ENCOUNTER — Ambulatory Visit (HOSPITAL_BASED_OUTPATIENT_CLINIC_OR_DEPARTMENT_OTHER): Payer: 59 | Admitting: Anesthesiology

## 2023-01-31 ENCOUNTER — Other Ambulatory Visit: Payer: Self-pay

## 2023-01-31 ENCOUNTER — Encounter (HOSPITAL_BASED_OUTPATIENT_CLINIC_OR_DEPARTMENT_OTHER)
Admission: RE | Disposition: A | Discharge status: Home / Self Care | Payer: Self-pay | Source: Home / Self Care | Attending: Plastic Surgery

## 2023-01-31 ENCOUNTER — Encounter (HOSPITAL_BASED_OUTPATIENT_CLINIC_OR_DEPARTMENT_OTHER): Payer: Self-pay | Admitting: Plastic Surgery

## 2023-01-31 DIAGNOSIS — E1165 Type 2 diabetes mellitus with hyperglycemia: Secondary | ICD-10-CM | POA: Diagnosis not present

## 2023-01-31 DIAGNOSIS — X58XXXA Exposure to other specified factors, initial encounter: Secondary | ICD-10-CM | POA: Diagnosis not present

## 2023-01-31 DIAGNOSIS — E785 Hyperlipidemia, unspecified: Secondary | ICD-10-CM | POA: Diagnosis not present

## 2023-01-31 DIAGNOSIS — K219 Gastro-esophageal reflux disease without esophagitis: Secondary | ICD-10-CM | POA: Insufficient documentation

## 2023-01-31 DIAGNOSIS — Z7985 Long-term (current) use of injectable non-insulin antidiabetic drugs: Secondary | ICD-10-CM | POA: Diagnosis not present

## 2023-01-31 DIAGNOSIS — S31501A Unspecified open wound of unspecified external genital organs, male, initial encounter: Secondary | ICD-10-CM | POA: Insufficient documentation

## 2023-01-31 DIAGNOSIS — Z87891 Personal history of nicotine dependence: Secondary | ICD-10-CM

## 2023-01-31 DIAGNOSIS — E119 Type 2 diabetes mellitus without complications: Secondary | ICD-10-CM | POA: Diagnosis not present

## 2023-01-31 DIAGNOSIS — N493 Fournier gangrene: Secondary | ICD-10-CM | POA: Insufficient documentation

## 2023-01-31 DIAGNOSIS — F1290 Cannabis use, unspecified, uncomplicated: Secondary | ICD-10-CM | POA: Diagnosis not present

## 2023-01-31 DIAGNOSIS — I10 Essential (primary) hypertension: Secondary | ICD-10-CM | POA: Diagnosis not present

## 2023-01-31 HISTORY — PX: DEBRIDEMENT AND CLOSURE WOUND: SHX5614

## 2023-01-31 LAB — GLUCOSE, CAPILLARY
Glucose-Capillary: 190 mg/dL — ABNORMAL HIGH (ref 70–99)
Glucose-Capillary: 177 mg/dL — ABNORMAL HIGH (ref 70–99)

## 2023-01-31 SURGERY — DEBRIDEMENT, WOUND, WITH CLOSURE
Anesthesia: General | Site: Scrotum

## 2023-01-31 MED ORDER — CEFAZOLIN SODIUM-DEXTROSE 2-4 GM/100ML-% IV SOLN
2.0000 g | INTRAVENOUS | Status: AC
  Administered 2023-01-31: 2 g via INTRAVENOUS

## 2023-01-31 MED ORDER — MIDAZOLAM HCL 2 MG/2ML IJ SOLN
INTRAMUSCULAR | Status: AC
  Filled 2023-01-31: qty 2

## 2023-01-31 MED ORDER — LIDOCAINE 2% (20 MG/ML) 5 ML SYRINGE
INTRAMUSCULAR | Status: AC
  Filled 2023-01-31: qty 5

## 2023-01-31 MED ORDER — FENTANYL CITRATE (PF) 100 MCG/2ML IJ SOLN
INTRAMUSCULAR | Status: AC
  Filled 2023-01-31: qty 2

## 2023-01-31 MED ORDER — ONDANSETRON HCL 4 MG/2ML IJ SOLN: 4.0000 mg | Freq: Four times a day (QID) | INTRAMUSCULAR | Status: DC | PRN

## 2023-01-31 MED ORDER — PROPOFOL 10 MG/ML IV BOLUS
INTRAVENOUS | Status: DC | PRN
Start: 2023-01-31 — End: 2023-01-31
  Administered 2023-01-31: 200 mg via INTRAVENOUS

## 2023-01-31 MED ORDER — LACTATED RINGERS IV SOLN: INTRAVENOUS | Status: DC

## 2023-01-31 MED ORDER — PROPOFOL 10 MG/ML IV BOLUS
INTRAVENOUS | Status: AC
  Filled 2023-01-31: qty 20

## 2023-01-31 MED ORDER — BUPIVACAINE-EPINEPHRINE (PF) 0.25% -1:200000 IJ SOLN
INTRAMUSCULAR | Status: AC
  Filled 2023-01-31: qty 30

## 2023-01-31 MED ORDER — ONDANSETRON HCL 4 MG/2ML IJ SOLN
INTRAMUSCULAR | Status: AC
  Filled 2023-01-31: qty 2

## 2023-01-31 MED ORDER — 0.9 % SODIUM CHLORIDE (POUR BTL) OPTIME
TOPICAL | Status: DC | PRN
Start: 2023-01-31 — End: 2023-01-31
  Administered 2023-01-31: 1000 mL

## 2023-01-31 MED ORDER — CHLORHEXIDINE GLUCONATE CLOTH 2 % EX PADS: 6.0000 | MEDICATED_PAD | Freq: Once | CUTANEOUS | Status: DC

## 2023-01-31 MED ORDER — OXYCODONE HCL 5 MG PO TABS
ORAL_TABLET | ORAL | Status: AC
  Filled 2023-01-31: qty 1

## 2023-01-31 MED ORDER — OXYCODONE HCL 5 MG PO TABS
5.0000 mg | ORAL_TABLET | Freq: Once | ORAL | Status: AC | PRN
  Administered 2023-01-31: 5 mg via ORAL

## 2023-01-31 MED ORDER — FENTANYL CITRATE (PF) 100 MCG/2ML IJ SOLN
INTRAMUSCULAR | Status: DC | PRN
  Administered 2023-01-31: 25 ug via INTRAVENOUS
  Administered 2023-01-31: 50 ug via INTRAVENOUS
  Administered 2023-01-31: 25 ug via INTRAVENOUS
  Administered 2023-01-31: 50 ug via INTRAVENOUS
  Administered 2023-01-31 (×3): 25 ug via INTRAVENOUS

## 2023-01-31 MED ORDER — VASHE WOUND IRRIGATION OPTIME
TOPICAL | Status: DC | PRN
  Administered 2023-01-31: 34 [oz_av]

## 2023-01-31 MED ORDER — DIPHENHYDRAMINE HCL 50 MG/ML IJ SOLN
INTRAMUSCULAR | Status: AC
  Filled 2023-01-31: qty 1

## 2023-01-31 MED ORDER — BUPIVACAINE-EPINEPHRINE (PF) 0.5% -1:200000 IJ SOLN
INTRAMUSCULAR | Status: AC
  Filled 2023-01-31: qty 30

## 2023-01-31 MED ORDER — DEXMEDETOMIDINE HCL IN NACL 80 MCG/20ML IV SOLN
INTRAVENOUS | Status: DC | PRN
Start: 2023-01-31 — End: 2023-01-31
  Administered 2023-01-31: 8 ug via INTRAVENOUS

## 2023-01-31 MED ORDER — CEFAZOLIN SODIUM-DEXTROSE 2-4 GM/100ML-% IV SOLN
INTRAVENOUS | Status: AC
  Filled 2023-01-31: qty 100

## 2023-01-31 MED ORDER — DEXAMETHASONE SODIUM PHOSPHATE 10 MG/ML IJ SOLN
INTRAMUSCULAR | Status: AC
  Filled 2023-01-31: qty 1

## 2023-01-31 MED ORDER — MIDAZOLAM HCL 5 MG/5ML IJ SOLN
INTRAMUSCULAR | Status: DC | PRN
  Administered 2023-01-31 (×2): 1 mg via INTRAVENOUS

## 2023-01-31 MED ORDER — ONDANSETRON HCL 4 MG/2ML IJ SOLN
INTRAMUSCULAR | Status: DC | PRN
  Administered 2023-01-31: 4 mg via INTRAVENOUS

## 2023-01-31 MED ORDER — DIPHENHYDRAMINE HCL 50 MG/ML IJ SOLN
6.2500 mg | Freq: Once | INTRAMUSCULAR | Status: AC
  Administered 2023-01-31: 6.5 mg via INTRAVENOUS

## 2023-01-31 MED ORDER — FENTANYL CITRATE (PF) 100 MCG/2ML IJ SOLN
25.0000 ug | INTRAMUSCULAR | Status: DC | PRN
  Administered 2023-01-31: 50 ug via INTRAVENOUS

## 2023-01-31 MED ORDER — BUPIVACAINE HCL (PF) 0.5 % IJ SOLN
INTRAMUSCULAR | Status: AC
  Filled 2023-01-31: qty 30

## 2023-01-31 MED ORDER — LIDOCAINE HCL (CARDIAC) PF 100 MG/5ML IV SOSY
PREFILLED_SYRINGE | INTRAVENOUS | Status: DC | PRN
  Administered 2023-01-31: 60 mg via INTRAVENOUS

## 2023-01-31 MED ORDER — OXYCODONE HCL 5 MG/5ML PO SOLN: 5.0000 mg | Freq: Once | ORAL | Status: AC | PRN

## 2023-01-31 SURGICAL SUPPLY — 103 items
ADH SKN CLS APL DERMABOND .7 (GAUZE/BANDAGES/DRESSINGS)
APL SKNCLS STERI-STRIP NONHPOA (GAUZE/BANDAGES/DRESSINGS)
BAG DECANTER FOR FLEXI CONT (MISCELLANEOUS) IMPLANT
BENZOIN TINCTURE PRP APPL 2/3 (GAUZE/BANDAGES/DRESSINGS) IMPLANT
BLADE CLIPPER SURG (BLADE) IMPLANT
BLADE HEX COATED 2.75 (ELECTRODE) ×2 IMPLANT
BLADE MINI RND TIP GREEN BEAV (BLADE) IMPLANT
BLADE SURG 10 STRL SS (BLADE) IMPLANT
BLADE SURG 15 STRL LF DISP TIS (BLADE) ×2 IMPLANT
BLADE SURG 15 STRL SS (BLADE) ×2
BNDG CMPR 5X3 KNIT ELC UNQ LF (GAUZE/BANDAGES/DRESSINGS)
BNDG CMPR 5X4 CHSV STRCH STRL (GAUZE/BANDAGES/DRESSINGS)
BNDG CMPR 5X4 KNIT ELC UNQ LF (GAUZE/BANDAGES/DRESSINGS)
BNDG CMPR 6 X 5 YARDS HK CLSR (GAUZE/BANDAGES/DRESSINGS)
BNDG COHESIVE 4X5 TAN STRL LF (GAUZE/BANDAGES/DRESSINGS) IMPLANT
BNDG ELASTIC 3INX 5YD STR LF (GAUZE/BANDAGES/DRESSINGS) IMPLANT
BNDG ELASTIC 4INX 5YD STR LF (GAUZE/BANDAGES/DRESSINGS) IMPLANT
BNDG ELASTIC 6INX 5YD STR LF (GAUZE/BANDAGES/DRESSINGS) IMPLANT
BNDG GAUZE DERMACEA FLUFF 4 (GAUZE/BANDAGES/DRESSINGS) IMPLANT
BNDG GZE DERMACEA 4 6PLY (GAUZE/BANDAGES/DRESSINGS)
CANISTER SUCT 1200ML W/VALVE (MISCELLANEOUS) IMPLANT
CLEANSER WND VASHE 34 (WOUND CARE) IMPLANT
CORD BIPOLAR FORCEPS 12FT (ELECTRODE) IMPLANT
COVER BACK TABLE 60X90IN (DRAPES) ×2 IMPLANT
COVER MAYO STAND STRL (DRAPES) ×2 IMPLANT
DERMABOND ADVANCED .7 DNX12 (GAUZE/BANDAGES/DRESSINGS) IMPLANT
DRAIN CHANNEL 19F RND (DRAIN) IMPLANT
DRAIN PENROSE .5X12 LATEX STL (DRAIN) IMPLANT
DRAIN RELI 100 BL SUC LF ST (DRAIN)
DRAPE INCISE IOBAN 66X45 STRL (DRAPES) IMPLANT
DRAPE LAPAROSCOPIC ABDOMINAL (DRAPES) IMPLANT
DRAPE LAPAROTOMY 100X72 PEDS (DRAPES) IMPLANT
DRAPE SURG 17X23 STRL (DRAPES) IMPLANT
DRAPE U-SHAPE 76X120 STRL (DRAPES) IMPLANT
DRSG ADAPTIC 3X8 NADH LF (GAUZE/BANDAGES/DRESSINGS) IMPLANT
DRSG CUTIMED SORBACT 7X9 (GAUZE/BANDAGES/DRESSINGS) IMPLANT
DRSG EMULSION OIL 3X3 NADH (GAUZE/BANDAGES/DRESSINGS) IMPLANT
DRSG HYDROCOLLOID 4X4 (GAUZE/BANDAGES/DRESSINGS) IMPLANT
DRSG TEGADERM 4X10 (GAUZE/BANDAGES/DRESSINGS) IMPLANT
DRSG TELFA 3X8 NADH STRL (GAUZE/BANDAGES/DRESSINGS) IMPLANT
ELECT NDL TIP 2.8 STRL (NEEDLE) IMPLANT
ELECT NEEDLE TIP 2.8 STRL (NEEDLE)
ELECT REM PT RETURN 9FT ADLT (ELECTROSURGICAL) ×2
ELECTRODE REM PT RTRN 9FT ADLT (ELECTROSURGICAL) ×2 IMPLANT
EVACUATOR SILICONE 100CC (DRAIN) IMPLANT
GAUZE PAD ABD 8X10 STRL (GAUZE/BANDAGES/DRESSINGS) IMPLANT
GAUZE SPONGE 4X4 12PLY STRL (GAUZE/BANDAGES/DRESSINGS) ×2 IMPLANT
GAUZE SPONGE 4X4 12PLY STRL LF (GAUZE/BANDAGES/DRESSINGS) IMPLANT
GAUZE XEROFORM 5X9 LF (GAUZE/BANDAGES/DRESSINGS) IMPLANT
GLOVE BIO SURGEON STRL SZ 6.5 (GLOVE) ×4 IMPLANT
GLOVE BIO SURGEON STRL SZ7 (GLOVE) IMPLANT
GLOVE BIO SURGEON STRL SZ8 (GLOVE) IMPLANT
GLOVE BIOGEL M STRL SZ7.5 (GLOVE) IMPLANT
GLOVE BIOGEL PI IND STRL 7.0 (GLOVE) IMPLANT
GLOVE BIOGEL PI IND STRL 7.5 (GLOVE) IMPLANT
GLOVE SURG SYN 7.5 E (GLOVE) ×2
GLOVE SURG SYN 7.5 PF PI (GLOVE) IMPLANT
GOWN STRL REUS W/ TWL LRG LVL3 (GOWN DISPOSABLE) ×4 IMPLANT
GOWN STRL REUS W/ TWL XL LVL3 (GOWN DISPOSABLE) IMPLANT
GOWN STRL REUS W/TWL LRG LVL3 (GOWN DISPOSABLE) ×2
GOWN STRL REUS W/TWL XL LVL3 (GOWN DISPOSABLE) ×6
GRAFT MYRIAD 3 LAYER 5X5 (Graft) IMPLANT
NDL HYPO 25X1 1.5 SAFETY (NEEDLE) ×2 IMPLANT
NDL HYPO 27GX1-1/4 (NEEDLE) IMPLANT
NEEDLE HYPO 25X1 1.5 SAFETY (NEEDLE) ×2
NEEDLE HYPO 27GX1-1/4 (NEEDLE)
NS IRRIG 1000ML POUR BTL (IV SOLUTION) ×2 IMPLANT
PACK BASIN DAY SURGERY FS (CUSTOM PROCEDURE TRAY) ×2 IMPLANT
PACK LITHOTOMY IV (CUSTOM PROCEDURE TRAY) IMPLANT
PADDING CAST ABS COTTON 3X4 (CAST SUPPLIES) IMPLANT
PADDING CAST ABS COTTON 4X4 ST (CAST SUPPLIES) IMPLANT
PENCIL SMOKE EVACUATOR (MISCELLANEOUS) ×2 IMPLANT
SHEET MEDIUM DRAPE 40X70 STRL (DRAPES) IMPLANT
SLEEVE SCD COMPRESS KNEE MED (STOCKING) ×2 IMPLANT
SPIKE FLUID TRANSFER (MISCELLANEOUS) IMPLANT
SPLINT PLASTER CAST XFAST 3X15 (CAST SUPPLIES) IMPLANT
SPONGE T-LAP 18X18 ~~LOC~~+RFID (SPONGE) ×2 IMPLANT
STAPLER VISISTAT 35W (STAPLE) IMPLANT
STOCKINETTE 4X48 STRL (DRAPES) IMPLANT
STOCKINETTE 6 STRL (DRAPES) ×2 IMPLANT
STOCKINETTE IMPERVIOUS LG (DRAPES) IMPLANT
SUCTION TUBE FRAZIER 10FR DISP (SUCTIONS) IMPLANT
SURGILUBE 2OZ TUBE FLIPTOP (MISCELLANEOUS) IMPLANT
SUT MNCRL AB 3-0 PS2 18 (SUTURE) IMPLANT
SUT MNCRL AB 3-0 PS2 27 (SUTURE) IMPLANT
SUT MNCRL AB 4-0 PS2 18 (SUTURE) IMPLANT
SUT MON AB 5-0 PS2 18 (SUTURE) IMPLANT
SUT PROLENE 3 0 PS 1 (SUTURE) IMPLANT
SUT PROLENE 4 0 PS 2 18 (SUTURE) IMPLANT
SUT SILK 3 0 PS 1 (SUTURE) IMPLANT
SUT VIC AB 3-0 FS2 27 (SUTURE) IMPLANT
SUT VIC AB 5-0 P-3 18X BRD (SUTURE) IMPLANT
SUT VIC AB 5-0 P3 18 (SUTURE)
SUT VIC AB 5-0 PS2 18 (SUTURE) IMPLANT
SWAB COLLECTION DEVICE MRSA (MISCELLANEOUS) IMPLANT
SWAB CULTURE ESWAB REG 1ML (MISCELLANEOUS) IMPLANT
SYR BULB IRRIG 60ML STRL (SYRINGE) IMPLANT
SYR CONTROL 10ML LL (SYRINGE) ×2 IMPLANT
TOWEL GREEN STERILE FF (TOWEL DISPOSABLE) ×4 IMPLANT
TRAY DSU PREP LF (CUSTOM PROCEDURE TRAY) ×2 IMPLANT
TUBE CONNECTING 20X1/4 (TUBING) ×2 IMPLANT
UNDERPAD 30X36 HEAVY ABSORB (UNDERPADS AND DIAPERS) ×2 IMPLANT
YANKAUER SUCT BULB TIP NO VENT (SUCTIONS) ×2 IMPLANT

## 2023-01-31 NOTE — Interval H&P Note (Signed)
History and Physical Interval Note: No change in exam or indication for surgery. Surgical site marked, all questions answered. Will proceed with exam under anesthesia and closure of perineal wounds at his request.  01/31/2023 8:41 AM  [name]  has presented today for surgery, with the diagnosis of Fournier's gangrene of scrotum.  The various methods of treatment have been discussed with the patient and family. After consideration of risks, benefits and other options for treatment, the patient has consented to  Procedure(s): DEBRIDEMENT AND CLOSURE WOUND (N/A) APPLICATION OF SKIN SUBSTITUTE (N/A) APPLICATION OF WOUND VAC (N/A) as a surgical intervention.  The patient's history has been reviewed, patient examined, no change in status, stable for surgery.  I have reviewed the patient's chart and labs.  Questions were answered to the patient's satisfaction.     [name]

## 2023-01-31 NOTE — Discharge Instructions (Addendum)
Activity As tolerated: NO showers for 24 hours No driving No heavy activities  Diet: Regular. Drink plenty of fluids (Water. Avoid sugar/sodas/diet sodas)  Wound Care: Keep dressing clean & dry. Change scrotral dressing with ky jelly, gauze and ABD pads twice a day. Secure with underwear/under garment.  Be sure to keep stool out of the incisions.  You can shower 24 hours after surgery.   Special Instructions: Call Doctor if any unusual problems occur such as pain, excessive bleeding, unrelieved Nausea/vomiting, Fever &/or chills Call with questions  Follow-up appointment: Scheduled for next week.    Post Anesthesia Home Care Instructions  Activity: Get plenty of rest for the remainder of the day. A responsible individual must stay with you for 24 hours following the procedure.  For the next 24 hours, DO NOT: -Drive a car -Advertising copywriter -Drink alcoholic beverages -Take any medication unless instructed by your physician -Make any legal decisions or sign important papers.  Meals: Start with liquid foods such as gelatin or soup. Progress to regular foods as tolerated. Avoid greasy, spicy, heavy foods. If nausea and/or vomiting occur, drink only clear liquids until the nausea and/or vomiting subsides. Call your physician if vomiting continues.  Special Instructions/Symptoms: Your throat may feel dry or sore from the anesthesia or the breathing tube placed in your throat during surgery. If this causes discomfort, gargle with warm salt water. The discomfort should disappear within 24 hours.  If you had a scopolamine patch placed behind your ear for the management of post- operative nausea and/or vomiting:  1. The medication in the patch is effective for 72 hours, after which it should be removed.  Wrap patch in a tissue and discard in the trash. Wash hands thoroughly with soap and water. 2. You may remove the patch earlier than 72 hours if you experience unpleasant side effects  which may include dry mouth, dizziness or visual disturbances. 3. Avoid touching the patch. Wash your hands with soap and water after contact with the patch.

## 2023-01-31 NOTE — Op Note (Signed)
DATE OF OPERATION: 01/31/2023  LOCATION: [facility] operating Room  PREOPERATIVE DIAGNOSIS: Fournier's gangrene, open perineal wounds  POSTOPERATIVE DIAGNOSIS: Same  PROCEDURE: Exam under anesthesia, debridement of wounds, primary closure of perineal wound, placement of myriad tissue regenerative matrix on scrotum  SURGEON: Loren Racer, MD  ASSISTANT: Matt Scheeler  EBL: 50 cc  CONDITION: Stable  COMPLICATIONS: None  INDICATION: The patient, Steve [name], is a 42 y.o. adult born on 1980-07-21, is here for treatment open wounds in the perineum and scrotum secondary to Fournier gangrene fasciitis.   PROCEDURE DETAILS:  The patient was seen prior to surgery and marked.   IV antibiotics were given. The patient was taken to the operating room and given a general anesthetic. A standard time out was performed and all information was confirmed by those in the room. SCDs were placed.   The lower abdomen perineum and scrotum were prepped and draped in the usual sterile manner.  The perineal wounds were debrided, skin edges excised, and surrounding tissue mobilized with a combination of sharp and blunt dissection.  The wound itself was debrided with the scalpel removing the top layer of tissue.  Hemostasis was achieved with the electrocautery and the wound was closed in layers with 3-0 Monocryl sutures in the dermis and skin clips and the skin the total length of the primary closure in the perineum was 6 cm attention was turned to the scrotum there were 2 areas where I was able to mobilize the tissue and close them primarily for a total of 2 cm of primary closure.  The remaining wound measured 4 x 6 cm.  The base of the wound was debrided sharply.  Hemostasis was achieved with the electrocautery.  A 5 x 5 cm portion of myriad regenerative matrix was placed in the wound and sutured to the skin edges with interrupted 4-0 Monocryl sutures.  Sorbact dressings were placed over the myriad and sutured  in place with 4-0 Prolene sutures.  Dressings with surgical lube and Vashe soaked gauze were placed over the myriad.  Dry dressings were placed over the primary closures.  The patient was awakened from anesthesia without incident transferred to the recovery room in good condition.  All instrument needle and sponge counts were reported as correct.  There were no complications. The patient was allowed to wake up and taken to recovery room in stable condition at the end of the case. The family was notified at the end of the case.   The advanced practice practitioner (APP) assisted throughout the case.  The APP was essential in retraction and counter traction when needed to make the case progress smoothly.  This retraction and assistance made it possible to see the tissue plans for the procedure.  The assistance was needed for blood control, tissue re-approximation and assisted with closure of the incision site.

## 2023-01-31 NOTE — Progress Notes (Signed)
Steve, [name] ([id number]) [id number] Page 1 of 9 Visit Report for 01/28/2023 Arrival Information Details Patient Name: Date of Service: Steve [name] [name] 01/28/2023 3:15 PM Medical Record Number: [id number] Patient Account Number: [id number] Date of Birth/Sex: Treating RN: [birth year] (41 y.o. Steve [name]) Yevonne Pax Primary Care Jaycob Mcclenton: Larae Grooms Other Clinician: Referring Eric Nees: Treating Kameo Bains/Extender: RO BSO Dorris Carnes, MICHA EL Jones Skene, Kellie Shropshire in Treatment: 2 Visit Information History Since Last Visit Added or deleted any medications: No Patient Arrived: Ambulatory Any new allergies or adverse reactions: No Arrival Time: 14:59 Had a fall or experienced change in No Accompanied By: self activities of daily living that may affect Transfer Assistance: None risk of falls: Patient Identification Verified: Yes Signs or symptoms of abuse/neglect since last visito No Secondary Verification Process Completed: Yes Hospitalized since last visit: No Patient Requires Transmission-Based Precautions: No Implantable device outside of the clinic excluding No Patient Has Alerts: No cellular tissue based products placed in the [name] since last visit: Has Dressing in Place as Prescribed: Yes Pain Present Now: No Electronic Signature(s) Signed: 01/31/2023 11:58:03 AM By: Yevonne Pax RN Entered By: Yevonne Pax on 01/28/2023 11:59:36 -------------------------------------------------------------------------------- Clinic Level of Care Assessment Details Patient Name: Date of Service: Steve [name] 01/28/2023 3:15 PM Medical Record Number: [id number] Patient Account Number: [id number] Date of Birth/Sex: Treating RN: [birth year] (41 y.o. Steve [name] Primary Care Cory Rama: Larae Grooms Other Clinician: Referring Capucine Tryon: Treating Estiben Mizuno/Extender: RO BSO N, MICHA EL Mallie Mussel in Treatment: 2 Clinic Level of Care  Assessment Items TOOL 4 Quantity Score X- 1 0 Use when only an EandM is performed on FOLLOW-UP visit ASSESSMENTS - Nursing Assessment / Reassessment X- 1 10 Reassessment of Co-morbidities (includes updates in patient status) X- 1 5 Reassessment of Adherence to Treatment Plan Steve, RIEKER [name] ([id number]) [id number] Page 2 of 9 ASSESSMENTS - Wound and Skin A ssessment / Reassessment X - Simple Wound Assessment / Reassessment - one wound 1 5 []  - 0 Complex Wound Assessment / Reassessment - multiple wounds []  - 0 Dermatologic / Skin Assessment (not related to wound area) ASSESSMENTS - Focused Assessment []  - 0 Circumferential Edema Measurements - multi extremities []  - 0 Nutritional Assessment / Counseling / Intervention []  - 0 Lower Extremity Assessment (monofilament, tuning fork, pulses) []  - 0 Peripheral Arterial Disease Assessment (using hand held doppler) ASSESSMENTS - Ostomy and/or Continence Assessment and Care []  - 0 Incontinence Assessment and Management []  - 0 Ostomy Care Assessment and Management (repouching, etc.) PROCESS - Coordination of Care X - Simple Patient / Family Education for ongoing care 1 15 []  - 0 Complex (extensive) Patient / Family Education for ongoing care []  - 0 Staff obtains Chiropractor, Records, [name] Results / Process Orders est []  - 0 Staff telephones HHA, Nursing Homes / Clarify orders / etc []  - 0 Routine Transfer to another Facility (non-emergent condition) []  - 0 Routine [name] Admission (non-emergent condition) []  - 0 New Admissions / Manufacturing engineer / Ordering NPWT Apligraf, etc. , []  - 0 Emergency [name] Admission (emergent condition) X- 1 10 Simple Discharge Coordination []  - 0 Complex (extensive) Discharge Coordination PROCESS - Special Needs []  - 0 Pediatric / Minor Patient Management []  - 0 Isolation Patient Management []  - 0 Hearing / Language / Visual special needs []  -  0 Assessment of Community assistance (transportation, D/C planning, etc.) []  - 0 Additional assistance / Altered mentation []  - 0 Support Surface(s) Assessment (bed, cushion, seat, etc.)  INTERVENTIONS - Wound Cleansing / Measurement X - Simple Wound Cleansing - one wound 1 5 []  - 0 Complex Wound Cleansing - multiple wounds X- 1 5 Wound Imaging (photographs - any number of wounds) []  - 0 Wound Tracing (instead of photographs) X- 1 5 Simple Wound Measurement - one wound []  - 0 Complex Wound Measurement - multiple wounds INTERVENTIONS - Wound Dressings X - Small Wound Dressing one or multiple wounds 1 10 []  - 0 Medium Wound Dressing one or multiple wounds []  - 0 Large Wound Dressing one or multiple wounds []  - 0 Application of Medications - topical []  - 0 Application of Medications - injection INTERVENTIONS - Miscellaneous []  - 0 External ear exam Steve, KENWORTHY [name] ([id number]) [id number] Page 3 of 9 []  - 0 Specimen Collection (cultures, biopsies, blood, body fluids, etc.) []  - 0 Specimen(s) / Culture(s) sent or taken to Lab for analysis []  - 0 Patient Transfer (multiple staff / Michiel Sites Lift / Similar devices) []  - 0 Simple Staple / Suture removal (25 or less) []  - 0 Complex Staple / Suture removal (26 or more) []  - 0 Hypo / Hyperglycemic Management (close monitor of Blood Glucose) []  - 0 Ankle / Brachial Index (ABI) - do not check if billed separately X- 1 5 Vital Signs Has the patient been seen at the [name] within the last three years: Yes Total Score: 75 Level Of Care: New/Established - Level 2 Electronic Signature(s) Signed: 01/31/2023 11:58:03 AM By: Yevonne Pax RN Entered By: Yevonne Pax on 01/28/2023 12:51:29 -------------------------------------------------------------------------------- Encounter Discharge Information Details Patient Name: Date of Service: Steve [name] 01/28/2023 3:15 PM Medical Record Number:  [id number] Patient Account Number: [id number] Date of Birth/Sex: Treating RN: 01/13/1981 (41 y.o. Steve [name] Primary Care Obed Samek: Larae Grooms Other Clinician: Referring Markavious Micco: Treating Thirza Pellicano/Extender: RO BSO N, MICHA EL Mallie Mussel in Treatment: 2 Encounter Discharge Information Items Discharge Condition: Stable Ambulatory Status: Ambulatory Discharge Destination: Home Transportation: Private Auto Accompanied By: self Schedule Follow-up Appointment: Yes Clinical Summary of Care: Electronic Signature(s) Signed: 01/28/2023 3:53:02 PM By: Yevonne Pax RN Entered By: Yevonne Pax on 01/28/2023 12:53:02 -------------------------------------------------------------------------------- Lower Extremity Assessment Details Patient Name: Date of Service: Steve [name] [name] 01/28/2023 3:15 PM Steve [name] ([id number]) [id number] Page 4 of 9 Medical Record Number: [id number] Patient Account Number: [id number] Date of Birth/Sex: Treating RN: [birth year] (41 y.o. Steve [name]) Yevonne Pax Primary Care Avneet Ashmore: Larae Grooms Other Clinician: Referring Blythe Hartshorn: Treating Kasean Denherder/Extender: RO BSO Dorris Carnes, MICHA EL Jones Skene, Kellie Shropshire in Treatment: 2 Electronic Signature(s) Signed: 01/28/2023 3:49:52 PM By: Yevonne Pax RN Entered By: Yevonne Pax on 01/28/2023 12:49:52 -------------------------------------------------------------------------------- Multi Wound Chart Details Patient Name: Date of Service: Steve [name] 01/28/2023 3:15 PM Medical Record Number: [id number] Patient Account Number: [id number] Date of Birth/Sex: Treating RN: Jun 18, 1980 (41 y.o. Steve [name] Primary Care Destini Cambre: Larae Grooms Other Clinician: Referring Starling Jessie: Treating Britney Captain/Extender: RO BSO N, MICHA EL Mallie Mussel in Treatment: 2 Vital Signs Height(in): 74 Pulse(bpm): 96 Weight(lbs): 230 Blood Pressure(mmHg):  171/80 Body Mass Index(BMI): 29.5 Temperature(F): 98.3 Respiratory Rate(breaths/min): 18 [1:Photos:] [N/A:N/A] Scrotum N/A N/A Wound Location: Gradually Appeared N/A N/A Wounding Event: Open Surgical Wound N/A N/A Primary Etiology: Hypertension, Type II Diabetes N/A N/A Comorbid History: 01/03/2023 N/A N/A Date Acquired: 2 N/A N/A Weeks of Treatment: Open N/A N/A Wound Status: No N/A N/A Wound Recurrence: 9.5x5x1 N/A N/A Measurements L x W x D (cm) 37.306 N/A N/A  A (cm) : rea 37.306 N/A N/A Volume (cm) : 46.00% N/A N/A % Reduction in A rea: 91.70% N/A N/A % Reduction in Volume: Full Thickness Without Exposed N/A N/A Classification: Support Structures Medium N/A N/A Exudate Amount: Serosanguineous N/A N/A Exudate Type: red, brown N/A N/A Exudate Color: Large (67-100%) N/A N/A Granulation Amount: Red N/A N/A Granulation Quality: Small (1-33%) N/A N/A Necrotic Amount: Fat Layer (Subcutaneous Tissue): Yes N/A N/A Exposed Structures: Fascia: No Tendon: No Muscle: No Joint: No KAHLEB, MARIK [name] ([id number]) [id number] Page 5 of 9 Bone: No None N/A N/A Epithelialization: Treatment Notes Electronic Signature(s) Signed: 01/28/2023 3:50:05 PM By: Yevonne Pax RN Entered By: Yevonne Pax on 01/28/2023 12:50:04 -------------------------------------------------------------------------------- Multi-Disciplinary Care Plan Details Patient Name: Date of Service: [name] [place] [name] 01/28/2023 3:15 PM Medical Record Number: [id number] Patient Account Number: [id number] Date of Birth/Sex: Treating RN: Aug 14, 1980 (41 y.o. Steve [name]) Yevonne Pax Primary Care Talitha Dicarlo: Larae Grooms Other Clinician: Referring Jody Silas: Treating Donnamae Muilenburg/Extender: RO BSO N, MICHA EL Mallie Mussel in Treatment: 2 Active Inactive Necrotic Tissue Nursing Diagnoses: Knowledge deficit related to management of necrotic/devitalized  tissue Goals: Patient/caregiver will verbalize understanding of reason and process for debridement of necrotic tissue Date Initiated: 01/13/2023 Target Resolution Date: 02/13/2023 Goal Status: Active Interventions: Assess patient pain level pre-, during and post procedure and prior to discharge Treatment Activities: Apply topical anesthetic as ordered : 01/13/2023 Notes: Nutrition Nursing Diagnoses: Potential for alteratiion in Nutrition/Potential for imbalanced nutrition Goals: Patient/caregiver verbalizes understanding of need to maintain therapeutic glucose control per primary care physician Date Initiated: 01/13/2023 Target Resolution Date: 02/13/2023 Goal Status: Active Interventions: Assess HgA1c results as ordered upon admission and as needed Notes: Wound/Skin Impairment Nursing Diagnoses: Knowledge deficit related to ulceration/compromised skin integrity Goals: Patient/caregiver will verbalize understanding of skin care regimen MESSIAH, Steve [name] ([id number]) [id number] Page 6 of 9 Date Initiated: 01/13/2023 Target Resolution Date: 02/13/2023 Goal Status: Active Ulcer/skin breakdown will have a volume reduction of 30% by week 4 Date Initiated: 01/13/2023 Target Resolution Date: 02/13/2023 Goal Status: Active Ulcer/skin breakdown will have a volume reduction of 50% by week 8 Date Initiated: 01/13/2023 Target Resolution Date: 03/15/2023 Goal Status: Active Ulcer/skin breakdown will have a volume reduction of 80% by week 12 Date Initiated: 01/13/2023 Target Resolution Date: 04/15/2023 Goal Status: Active Ulcer/skin breakdown will heal within 14 weeks Date Initiated: 01/13/2023 Target Resolution Date: 05/15/2023 Goal Status: Active Interventions: Assess patient/caregiver ability to obtain necessary supplies Assess patient/caregiver ability to perform ulcer/skin care regimen upon admission and as needed Assess ulceration(s) every  visit Notes: Electronic Signature(s) Signed: 01/28/2023 3:51:52 PM By: Yevonne Pax RN Entered By: Yevonne Pax on 01/28/2023 12:51:52 -------------------------------------------------------------------------------- Pain Assessment Details Patient Name: Date of Service: Steve [name] [name] 01/28/2023 3:15 PM Medical Record Number: [id number] Patient Account Number: [id number] Date of Birth/Sex: Treating RN: 06-Dec-1980 (41 y.o. Steve [name] Primary Care Meade Hogeland: Larae Grooms Other Clinician: Referring Sherronda Sweigert: Treating Greer Koeppen/Extender: RO BSO N, MICHA EL Mallie Mussel in Treatment: 2 Active Problems Location of Pain Severity and Description of Pain Patient Has Paino No Site Locations Pain Management and Medication Current Pain ManagementRAEQUON, ZETTLER ([id number]) [id number] Page 7 of 9 Electronic Signature(s) Signed: 01/31/2023 11:58:03 AM By: Yevonne Pax RN Entered By: Yevonne Pax on 01/28/2023 12:00:15 -------------------------------------------------------------------------------- Patient/Caregiver Education Details Patient Name: Date of Service: Steve [name] 8/27/2024andnbsp3:15 PM Medical Record Number: [id number] Patient Account Number: [id number] Date of Birth/Gender: Treating RN: 06-11-1980 (41 y.o. M) Epps,  Lyla Son Primary Care Physician: Larae Grooms Other Clinician: Referring Physician: Treating Physician/Extender: RO BSO Dorris Carnes, MICHA EL Jones Skene, Kellie Shropshire in Treatment: 2 Education Assessment Education Provided To: Patient Education Topics Provided Wound/Skin Impairment: Handouts: Caring for Your Ulcer Methods: Explain/Verbal Responses: State content correctly Electronic Signature(s) Signed: 01/31/2023 11:58:03 AM By: Yevonne Pax RN Entered By: Yevonne Pax on 01/28/2023 12:52:11 -------------------------------------------------------------------------------- Wound Assessment  Details Patient Name: Date of Service: Steve [name] [name] 01/28/2023 3:15 PM Medical Record Number: [id number] Patient Account Number: [id number] Date of Birth/Sex: Treating RN: [birth year] (41 y.o. Steve [name] Primary Care Brittiney Dicostanzo: Larae Grooms Other Clinician: Referring Nasean Zapf: Treating Liala Codispoti/Extender: RO BSO N, MICHA EL Mallie Mussel in Treatment: 2 Wound Status Wound Number: 1 Primary Etiology: Open Surgical Wound Wound Location: Scrotum Wound Status: Open Wounding Event: Gradually Appeared Comorbid History: Hypertension, Type II Diabetes Date Acquired: 01/03/2023 Weeks Of Treatment: 2 Steve [name] ([id number]) [id number] Page 8 of 9 Clustered Wound: No Photos Wound Measurements Length: (cm) 9.5 Width: (cm) 5 Depth: (cm) 1 Area: (cm) 37.306 Volume: (cm) 37.306 % Reduction in Area: 46% % Reduction in Volume: 91.7% Epithelialization: None Tunneling: No Undermining: No Wound Description Classification: Full Thickness Without Exposed Support Structures Exudate Amount: Medium Exudate Type: Serosanguineous Exudate Color: red, brown Foul Odor After Cleansing: No Slough/Fibrino No Wound Bed Granulation Amount: Large (67-100%) Exposed Structure Granulation Quality: Red Fascia Exposed: No Necrotic Amount: Small (1-33%) Fat Layer (Subcutaneous Tissue) Exposed: Yes Necrotic Quality: Adherent Slough Tendon Exposed: No Muscle Exposed: No Joint Exposed: No Bone Exposed: No Treatment Notes Wound #1 (Scrotum) Cleanser Normal Saline Discharge Instruction: Wash your hands with soap and water. Remove old dressing, discard into plastic bag and place into trash. Cleanse the wound with Normal Saline prior to applying a clean dressing using gauze sponges, not tissues or cotton balls. Do not scrub or use excessive force. Pat dry using gauze sponges, not tissue or cotton balls. Peri-Wound Care Topical Primary  Dressing Gauze Discharge Instruction: moistened with saline Secondary Dressing ABD Pad 5x9 (in/in) Discharge Instruction: Cover with ABD pad Secured With Compression Wrap Compression Stockings Add-Ons Electronic Signature(s) Signed: 01/31/2023 11:58:03 AM By: Yevonne Pax RN Entered By: Yevonne Pax on 01/28/2023 12:18:15 [name] ([id number]) [id number] Page 9 of 9 -------------------------------------------------------------------------------- Vitals Details Patient Name: Date of Service: Steve [name] 01/28/2023 3:15 PM Medical Record Number: [id number] Patient Account Number: [id number] Date of Birth/Sex: Treating RN: [birth year] (41 y.o. Steve [name]) Yevonne Pax Primary Care Letia Guidry: Larae Grooms Other Clinician: Referring Harrison Paulson: Treating Jovany Disano/Extender: RO BSO N, MICHA EL Jones Skene, Kellie Shropshire in Treatment: 2 Vital Signs Time Taken: 14:59 Temperature (F): 98.3 Height (in): 74 Pulse (bpm): 96 Weight (lbs): 230 Respiratory Rate (breaths/min): 18 Body Mass Index (BMI): 29.5 Blood Pressure (mmHg): 171/80 Reference Range: 80 - 120 mg / dl Electronic Signature(s) Signed: 01/31/2023 11:58:03 AM By: Yevonne Pax RN Entered By: Yevonne Pax on 01/28/2023 12:00:08

## 2023-01-31 NOTE — Progress Notes (Signed)
MAHIN, DINSMOOR [name] ([id number]) [id number] Page 1 of 9 Visit Report for 01/21/2023 Arrival Information Details Patient Name: Date of Service: Steve [name] [name] 01/21/2023 12:30 PM Medical Record Number: [id number] Patient Account Number: [id number] Date of Birth/Sex: Treating RN: 1980/11/19 (42 y.o. [name]) Yevonne Pax Primary Care [place] Graefe: Larae Grooms Other Clinician: Referring Sinan Tuch: Treating Braxtyn Dorff/Extender: Layne Benton in Treatment: 1 Visit Information History Since Last Visit Added or deleted any medications: No Patient Arrived: Ambulatory Any new allergies or adverse reactions: No Arrival Time: 12:39 Had a fall or experienced change in No Accompanied By: self activities of daily living that may affect Transfer Assistance: None risk of falls: Patient Identification Verified: Yes Signs or symptoms of abuse/neglect since last visito No Secondary Verification Process Completed: Yes Hospitalized since last visit: No Patient Requires Transmission-Based Precautions: No Implantable device outside of the clinic excluding No Patient Has Alerts: No cellular tissue based products placed in the center since last visit: Has Dressing in Place as Prescribed: Yes Pain Present Now: No Electronic Signature(s) Signed: 01/31/2023 11:59:03 AM By: Yevonne Pax RN Entered By: Yevonne Pax on 01/21/2023 09:40:04 -------------------------------------------------------------------------------- Clinic Level of Care Assessment Details Patient Name: Date of Service: Steve Proud [name] 01/21/2023 12:30 PM Medical Record Number: [id number] Patient Account Number: [id number] Date of Birth/Sex: Treating RN: 02/11/1981 (42 y.o. [name] Primary Care Hulbert Branscome: Larae Grooms Other Clinician: Referring Naliya Gish: Treating Esteban Kobashigawa/Extender: Layne Benton in Treatment: 1 Clinic Level of Care Assessment  Items TOOL 4 Quantity Score X- 1 0 Use when only an EandM is performed on FOLLOW-UP visit ASSESSMENTS - Nursing Assessment / Reassessment X- 1 10 Reassessment of Co-morbidities (includes updates in patient status) X- 1 5 Reassessment of Adherence to Treatment Plan Steve [name], Steve [name] ([id number]) [id number] Page 2 of 9 ASSESSMENTS - Wound and Skin A ssessment / Reassessment X - Simple Wound Assessment / Reassessment - one wound 1 5 []  - 0 Complex Wound Assessment / Reassessment - multiple wounds []  - 0 Dermatologic / Skin Assessment (not related to wound area) ASSESSMENTS - Focused Assessment []  - 0 Circumferential Edema Measurements - multi extremities []  - 0 Nutritional Assessment / Counseling / Intervention []  - 0 Lower Extremity Assessment (monofilament, tuning fork, pulses) []  - 0 Peripheral Arterial Disease Assessment (using hand held doppler) ASSESSMENTS - Ostomy and/or Continence Assessment and Care []  - 0 Incontinence Assessment and Management []  - 0 Ostomy Care Assessment and Management (repouching, etc.) PROCESS - Coordination of Care X - Simple Patient / Family Education for ongoing care 1 15 []  - 0 Complex (extensive) Patient / Family Education for ongoing care []  - 0 Staff obtains Chiropractor, Records, [name] Results / Process Orders est []  - 0 Staff telephones HHA, Nursing Homes / Clarify orders / etc []  - 0 Routine Transfer to another Facility (non-emergent condition) []  - 0 Routine Hospital Admission (non-emergent condition) []  - 0 New Admissions / Manufacturing engineer / Ordering NPWT Apligraf, etc. , []  - 0 Emergency Hospital Admission (emergent condition) X- 1 10 Simple Discharge Coordination []  - 0 Complex (extensive) Discharge Coordination PROCESS - Special Needs []  - 0 Pediatric / Minor Patient Management []  - 0 Isolation Patient Management []  - 0 Hearing / Language / Visual special needs []  - 0 Assessment of  Community assistance (transportation, D/C planning, etc.) []  - 0 Additional assistance / Altered mentation []  - 0 Support Surface(s) Assessment (bed, cushion, seat, etc.) INTERVENTIONS - Wound Cleansing / Measurement X -  Simple Wound Cleansing - one wound 1 5 []  - 0 Complex Wound Cleansing - multiple wounds X- 1 5 Wound Imaging (photographs - any number of wounds) []  - 0 Wound Tracing (instead of photographs) X- 1 5 Simple Wound Measurement - one wound []  - 0 Complex Wound Measurement - multiple wounds INTERVENTIONS - Wound Dressings X - Small Wound Dressing one or multiple wounds 1 10 []  - 0 Medium Wound Dressing one or multiple wounds []  - 0 Large Wound Dressing one or multiple wounds []  - 0 Application of Medications - topical []  - 0 Application of Medications - injection INTERVENTIONS - Miscellaneous []  - 0 External ear exam Steve [name], Steve [name] ([id number]) [id number] Page 3 of 9 []  - 0 Specimen Collection (cultures, biopsies, blood, body fluids, etc.) []  - 0 Specimen(s) / Culture(s) sent or taken to Lab for analysis []  - 0 Patient Transfer (multiple staff / Michiel Sites Lift / Similar devices) []  - 0 Simple Staple / Suture removal (25 or less) []  - 0 Complex Staple / Suture removal (26 or more) []  - 0 Hypo / Hyperglycemic Management (close monitor of Blood Glucose) []  - 0 Ankle / Brachial Index (ABI) - do not check if billed separately X- 1 5 Vital Signs Has the patient been seen at the hospital within the last three years: Yes Total Score: 75 Level Of Care: New/Established - Level 2 Electronic Signature(s) Signed: 01/31/2023 11:59:03 AM By: Yevonne Pax RN Entered By: Yevonne Pax on 01/21/2023 10:10:57 -------------------------------------------------------------------------------- Encounter Discharge Information Details Patient Name: Date of Service: Steve Proud [name] 01/21/2023 12:30 PM Medical Record Number: [id number] Patient  Account Number: [id number] Date of Birth/Sex: Treating RN: [birth year] (42 y.o. [name] Primary Care Graylee Arutyunyan: Larae Grooms Other Clinician: Referring Glendine Swetz: Treating Izaiah Tabb/Extender: Layne Benton in Treatment: 1 Encounter Discharge Information Items Discharge Condition: Stable Ambulatory Status: Ambulatory Discharge Destination: Home Transportation: Private Auto Accompanied By: self Schedule Follow-up Appointment: Yes Clinical Summary of Care: Electronic Signature(s) Signed: 01/31/2023 11:59:03 AM By: Yevonne Pax RN Entered By: Yevonne Pax on 01/21/2023 10:11:49 -------------------------------------------------------------------------------- Lower Extremity Assessment Details Patient Name: Date of Service: Steve [name] [facility] [name] 01/21/2023 12:30 PM Corwin Levins [name] ([id number]) [phone].[id number] Page 4 of 9 Medical Record Number: [id number] Patient Account Number: [id number] Date of Birth/Sex: Treating RN: [birth year] (42 y.o. [name]) Yevonne Pax Primary Care Delane Wessinger: Larae Grooms Other Clinician: Referring Shadi Sessler: Treating Mykhia Danish/Extender: Layne Benton in Treatment: 1 Electronic Signature(s) Signed: 01/31/2023 11:59:03 AM By: Yevonne Pax RN Entered By: Yevonne Pax on 01/21/2023 09:52:31 -------------------------------------------------------------------------------- Multi Wound Chart Details Patient Name: Date of Service: Steve Proud [name] 01/21/2023 12:30 PM Medical Record Number: [id number] Patient Account Number: [id number] Date of Birth/Sex: Treating RN: 03-Dec-1980 (41 y.o. [name] Primary Care Torah Pinnock: Larae Grooms Other Clinician: Referring Asbury Hair: Treating Venie Montesinos/Extender: Layne Benton in Treatment: 1 Vital Signs Height(in): 74 Pulse(bpm): 99 Weight(lbs): 230 Blood Pressure(mmHg): 153/91 Body Mass Index(BMI):  29.5 Temperature(F): 97.5 Respiratory Rate(breaths/min): 18 [1:Photos:] [N/A:N/A] Scrotum N/A N/A Wound Location: Gradually Appeared N/A N/A Wounding Event: Open Surgical Wound N/A N/A Primary Etiology: Hypertension, Type II Diabetes N/A N/A Comorbid History: 01/03/2023 N/A N/A Date Acquired: 1 N/A N/A Weeks of Treatment: Open N/A N/A Wound Status: No N/A N/A Wound Recurrence: 9.5x6.5x2.7 N/A N/A Measurements L x W x D (cm) 48.498 N/A N/A A (cm) : rea 130.946 N/A N/A Volume (cm) : 29.80% N/A N/A % Reduction in A rea: 70.90% N/A  N/A % Reduction in Volume: Full Thickness Without Exposed N/A N/A Classification: Support Structures Medium N/A N/A Exudate Amount: Serosanguineous N/A N/A Exudate Type: red, brown N/A N/A Exudate Color: Large (67-100%) N/A N/A Granulation Amount: Red N/A N/A Granulation Quality: Small (1-33%) N/A N/A Necrotic Amount: Fat Layer (Subcutaneous Tissue): Yes N/A N/A Exposed Structures: Fascia: No Tendon: No Muscle: No Joint: No Steve [name], Steve [name] ([id number]) [id number] Page 5 of 9 Bone: No None N/A N/A Epithelialization: Treatment Notes Electronic Signature(s) Signed: 01/31/2023 11:59:03 AM By: Yevonne Pax RN Entered By: Yevonne Pax on 01/21/2023 09:52:38 -------------------------------------------------------------------------------- Multi-Disciplinary Care Plan Details Patient Name: Date of Service: Steve Proud [name] 01/21/2023 12:30 PM Medical Record Number: [id number] Patient Account Number: [id number] Date of Birth/Sex: Treating RN: [birth year] (41 y.o. [name] Primary Care Goldia Ligman: Larae Grooms Other Clinician: Referring Dayane Hillenburg: Treating Maxson Oddo/Extender: Layne Benton in Treatment: 1 Active Inactive Necrotic Tissue Nursing Diagnoses: Knowledge deficit related to management of necrotic/devitalized tissue Goals: Patient/caregiver will verbalize  understanding of reason and process for debridement of necrotic tissue Date Initiated: 01/13/2023 Target Resolution Date: 02/13/2023 Goal Status: Active Interventions: Assess patient pain level pre-, during and post procedure and prior to discharge Treatment Activities: Apply topical anesthetic as ordered : 01/13/2023 Notes: Nutrition Nursing Diagnoses: Potential for alteratiion in Nutrition/Potential for imbalanced nutrition Goals: Patient/caregiver verbalizes understanding of need to maintain therapeutic glucose control per primary care physician Date Initiated: 01/13/2023 Target Resolution Date: 02/13/2023 Goal Status: Active Interventions: Assess HgA1c results as ordered upon admission and as needed Notes: Wound/Skin Impairment Nursing Diagnoses: Knowledge deficit related to ulceration/compromised skin integrity Goals: Patient/caregiver will verbalize understanding of skin care regimen Steve [name], Steve [name] ([id number]) [phone].pdf Page 6 of 9 Date Initiated: 01/13/2023 Target Resolution Date: 02/13/2023 Goal Status: Active Ulcer/skin breakdown will have a volume reduction of 30% by week 4 Date Initiated: 01/13/2023 Target Resolution Date: 02/13/2023 Goal Status: Active Ulcer/skin breakdown will have a volume reduction of 50% by week 8 Date Initiated: 01/13/2023 Target Resolution Date: 03/15/2023 Goal Status: Active Ulcer/skin breakdown will have a volume reduction of 80% by week 12 Date Initiated: 01/13/2023 Target Resolution Date: 04/15/2023 Goal Status: Active Ulcer/skin breakdown will heal within 14 weeks Date Initiated: 01/13/2023 Target Resolution Date: 05/15/2023 Goal Status: Active Interventions: Assess patient/caregiver ability to obtain necessary supplies Assess patient/caregiver ability to perform ulcer/skin care regimen upon admission and as needed Assess ulceration(s) every visit Notes: Electronic Signature(s) Signed: 01/31/2023 11:59:03 AM  By: Yevonne Pax RN Entered By: Yevonne Pax on 01/21/2023 09:53:07 -------------------------------------------------------------------------------- Pain Assessment Details Patient Name: Date of Service: Steve [name] [facility] [name] 01/21/2023 12:30 PM Medical Record Number: [id number] Patient Account Number: [id number] Date of Birth/Sex: Treating RN: 05-Feb-1981 (41 y.o. [name] Primary Care Briawna Carver: Larae Grooms Other Clinician: Referring Raquelle Pietro: Treating Symphonie Schneiderman/Extender: Layne Benton in Treatment: 1 Active Problems Location of Pain Severity and Description of Pain Patient Has Paino No Site Locations Pain Management and Medication Current Pain Management: Steve [name], Steve [name] ([id number]) [id number] Page 7 of 9 Electronic Signature(s) Signed: 01/31/2023 11:59:03 AM By: Yevonne Pax RN Entered By: Yevonne Pax on 01/21/2023 09:40:47 -------------------------------------------------------------------------------- Patient/Caregiver Education Details Patient Name: Date of Service: Steve [name] 8/20/2024andnbsp12:30 PM Medical Record Number: [id number] Patient Account Number: [id number] Date of Birth/Gender: Treating RN: Apr 30, 1981 (41 y.o. [name] Primary Care Physician: Larae Grooms Other Clinician: Referring Physician: Treating Physician/Extender: Layne Benton in Treatment: 1 Education Assessment Education Provided To: Patient Education Topics  Provided Wound/Skin Impairment: Handouts: Caring for Your Ulcer Methods: Explain/Verbal Responses: State content correctly Electronic Signature(s) Signed: 01/31/2023 11:59:03 AM By: Yevonne Pax RN Entered By: Yevonne Pax on 01/21/2023 09:53:17 -------------------------------------------------------------------------------- Wound Assessment Details Patient Name: Date of Service: Steve [name] [facility] [name] 01/21/2023 12:30 PM Medical  Record Number: [id number] Patient Account Number: [id number] Date of Birth/Sex: Treating RN: [birth year] (41 y.o. [name] Primary Care Tiyanna Larcom: Larae Grooms Other Clinician: Referring Ryszard Socarras: Treating Myrle Wanek/Extender: Layne Benton in Treatment: 1 Wound Status Wound Number: 1 Primary Etiology: Open Surgical Wound Wound Location: Scrotum Wound Status: Open Wounding Event: Gradually Appeared Comorbid History: Hypertension, Type II Diabetes Date Acquired: 01/03/2023 [facility] Of Treatment: 1 Steve [name], Steve [name] ([id number]) [id number] Page 8 of 9 Clustered Wound: No Photos Wound Measurements Length: (cm) 9.5 Width: (cm) 6.5 Depth: (cm) 2.7 Area: (cm) 48.498 Volume: (cm) 130.946 % Reduction in Area: 29.8% % Reduction in Volume: 70.9% Epithelialization: None Tunneling: No Undermining: No Wound Description Classification: Full Thickness Without Exposed Support Exudate Amount: Medium Exudate Type: Serosanguineous Exudate Color: red, brown Structures Foul Odor After Cleansing: No Slough/Fibrino No Wound Bed Granulation Amount: Large (67-100%) Exposed Structure Granulation Quality: Red Fascia Exposed: No Necrotic Amount: Small (1-33%) Fat Layer (Subcutaneous Tissue) Exposed: Yes Necrotic Quality: Adherent Slough Tendon Exposed: No Muscle Exposed: No Joint Exposed: No Bone Exposed: No Electronic Signature(s) Signed: 01/31/2023 11:59:03 AM By: Yevonne Pax RN Entered By: Yevonne Pax on 01/21/2023 09:52:05 -------------------------------------------------------------------------------- Vitals Details Patient Name: Date of Service: Steve Proud [name] 01/21/2023 12:30 PM Medical Record Number: [id number] Patient Account Number: [id number] Date of Birth/Sex: Treating RN: 05-14-1981 (42 y.o. [name]) Yevonne Pax Primary Care Grafton Warzecha: Larae Grooms Other Clinician: Referring Tiandra Swoveland: Treating Masaye Gatchalian/Extender:  Layne Benton in Treatment: 1 Vital Signs Time Taken: 12:40 Temperature (F): 97.5 Height (in): 74 Pulse (bpm): 99 Weight (lbs): 230 Respiratory Rate (breaths/min): 18 Body Mass Index (BMI): 29.5 Blood Pressure (mmHg): 153/91 Reference Range: 80 - 120 mg / dl Tyger, Hawkes Windle [name] ([id number]) [id number] Page 9 of 9 Electronic Signature(s) Signed: 01/31/2023 11:59:03 AM By: Yevonne Pax RN Entered By: Yevonne Pax on 01/21/2023 09:40:28

## 2023-01-31 NOTE — Anesthesia Procedure Notes (Signed)
Procedure Name: LMA Insertion Date/Time: 01/31/2023 9:07 AM  Performed by: Garth Bigness, CRNAPre-anesthesia Checklist: Patient identified, Emergency Drugs available, Suction available and Patient being monitored Patient Re-evaluated:Patient Re-evaluated prior to induction Oxygen Delivery Method: Circle system utilized Preoxygenation: Pre-oxygenation with 100% oxygen Induction Type: IV induction Ventilation: Mask ventilation without difficulty LMA: LMA inserted LMA Size: 4.0 Tube type: Oral Number of attempts: 1 Placement Confirmation: positive ETCO2 and breath sounds checked- equal and bilateral Tube secured with: Tape Dental Injury: Teeth and Oropharynx as per pre-operative assessment

## 2023-01-31 NOTE — Transfer of Care (Signed)
Immediate Anesthesia Transfer of Care Note  Patient: Steve [name]  Procedure(s) Performed: DEBRIDEMENT AND CLOSURE WOUND (Perineum) APPLICATION OF SKIN SUBSTITUTE (Scrotum)  Patient Location: PACU  Anesthesia Type:General  Level of Consciousness: awake, alert , oriented, and patient cooperative  Airway & Oxygen Therapy: Patient Spontanous Breathing and Patient connected to face mask oxygen  Post-op Assessment: Report given to RN and Post -op Vital signs reviewed and stable  Post vital signs: Reviewed and stable  Last Vitals:  Vitals Value Taken Time  BP 139/86 01/31/23 1045  Temp    Pulse 80 01/31/23 1050  Resp 12 01/31/23 1050  SpO2 97 % 01/31/23 1050  Vitals shown include unfiled device data.  Last Pain:  Vitals:   01/31/23 0734  TempSrc: Oral  PainSc: 0-No pain      Patients Stated Pain Goal: 3 (01/31/23 0734)  Complications: No notable events documented.

## 2023-01-31 NOTE — Anesthesia Preprocedure Evaluation (Signed)
Anesthesia Evaluation  Patient identified by MRN, date of birth, ID band Patient awake    Reviewed: Allergy & Precautions, H&P , NPO status , Patient's Chart, lab work & pertinent test results  Airway Mallampati: II   Neck ROM: full    Dental   Pulmonary Patient abstained from smoking., former smoker   breath sounds clear to auscultation       Cardiovascular hypertension,  Rhythm:regular Rate:Normal     Neuro/Psych    GI/Hepatic ,GERD  ,,  Endo/Other  diabetes, Type 2    Renal/GU      Musculoskeletal   Abdominal   Peds  Hematology   Anesthesia Other Findings   Reproductive/Obstetrics                             Anesthesia Physical Anesthesia Plan  ASA: 3  Anesthesia Plan: General   Post-op Pain Management:    Induction: Intravenous  PONV Risk Score and Plan: 2 and Ondansetron, Dexamethasone, Midazolam and Treatment may vary due to age or medical condition  Airway Management Planned: LMA  Additional Equipment:   Intra-op Plan:   Post-operative Plan: Extubation in OR  Informed Consent: I have reviewed the patients History and Physical, chart, labs and discussed the procedure including the risks, benefits and alternatives for the proposed anesthesia with the patient or authorized representative who has indicated his/her understanding and acceptance.     Dental advisory given  Plan Discussed with: CRNA, Anesthesiologist and Surgeon  Anesthesia Plan Comments:        Anesthesia Quick Evaluation

## 2023-02-01 NOTE — Anesthesia Postprocedure Evaluation (Signed)
Anesthesia Post Note  Patient: KEVIAN BURROLA  Procedure(s) Performed: DEBRIDEMENT AND CLOSURE WOUND (Perineum) APPLICATION OF SKIN SUBSTITUTE (Scrotum)     Patient location during evaluation: PACU Anesthesia Type: General Level of consciousness: awake and alert Pain management: pain level controlled Vital Signs Assessment: post-procedure vital signs reviewed and stable Respiratory status: spontaneous breathing, nonlabored ventilation, respiratory function stable and patient connected to nasal cannula oxygen Cardiovascular status: blood pressure returned to baseline and stable Postop Assessment: no apparent nausea or vomiting Anesthetic complications: no   No notable events documented.  Last Vitals:  Vitals:   01/31/23 1121 01/31/23 1136  BP: 139/85 (!) 175/95  Pulse: 71 74  Resp: 14 16  Temp:  (!) 36.4 C  SpO2: 95% 97%    Last Pain:  Vitals:   01/31/23 1136  TempSrc:   PainSc: 4                  Gaven Eugene S

## 2023-02-02 DIAGNOSIS — A419 Sepsis, unspecified organism: Secondary | ICD-10-CM | POA: Diagnosis not present

## 2023-02-02 DIAGNOSIS — E7030 Albinism, unspecified: Secondary | ICD-10-CM | POA: Diagnosis not present

## 2023-02-02 DIAGNOSIS — E785 Hyperlipidemia, unspecified: Secondary | ICD-10-CM | POA: Diagnosis not present

## 2023-02-02 DIAGNOSIS — I10 Essential (primary) hypertension: Secondary | ICD-10-CM | POA: Diagnosis not present

## 2023-02-02 DIAGNOSIS — I700 Atherosclerosis of aorta: Secondary | ICD-10-CM | POA: Diagnosis not present

## 2023-02-02 DIAGNOSIS — Z792 Long term (current) use of antibiotics: Secondary | ICD-10-CM | POA: Diagnosis not present

## 2023-02-02 DIAGNOSIS — Z5181 Encounter for therapeutic drug level monitoring: Secondary | ICD-10-CM | POA: Diagnosis not present

## 2023-02-02 DIAGNOSIS — E1165 Type 2 diabetes mellitus with hyperglycemia: Secondary | ICD-10-CM | POA: Diagnosis not present

## 2023-02-04 ENCOUNTER — Encounter (HOSPITAL_BASED_OUTPATIENT_CLINIC_OR_DEPARTMENT_OTHER): Payer: Self-pay | Admitting: Plastic Surgery

## 2023-02-05 ENCOUNTER — Telehealth: Payer: Self-pay | Admitting: Nurse Practitioner

## 2023-02-05 NOTE — Assessment & Plan Note (Signed)
Chronic, historic condition Patient's diabetes was previously uncontrolled as his previous A1c was 13.5 Since he was hospitalized for Fournier's gangrene he has been monitoring his glucose levels and reports that these have dramatically improved.  He states that they are around 130-170 He has been using a freestyle libre for monitoring and requests refills of this Will provide prescription for freestyle libre 3 to aid with monitoring He has been taking his Mounjaro 2.5 weekly injection.  We discussed that he will likely have to increase the dose of this to properly manage his diabetes but we will recheck his A1c at a later follow-up and make dosing decisions at that time Recommend follow-up in 4 weeks to assess regimen adherence and for monitoring

## 2023-02-05 NOTE — Assessment & Plan Note (Signed)
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 He has been going to apts with Plastic Surgery, ID and Wound care for follow up  Most recently was seen by ID on 01/23/23 - had PICC line removed in their office and has completed Ceftriaxone and flagyl regimen  He is using Clotrimazole for intertrigo management  He was also see by Plastics 01/27/23 - they report open wounds which appear to be healing well and are scheduling him for tissue matrix application - surgery is tomorrow He was seen by Wound care on 01/28/23 - reports healthy granulation tissue and well healing wound - recommend he cleans wound with normal saline or wound cleanser  Recommend close surveillance with PCP office as well as specialties to ensure proper recovery as well as manage his diabetes to prevent further complications. Recommend follow-up in about 4 weeks to discuss surgery results make sure that he is recovering well

## 2023-02-05 NOTE — Assessment & Plan Note (Signed)
Chronic, historic condition Patient reports that he ran out of amlodipine but he is now aware that there are refills available and he will go pick this up today BP is elevated in office but suspect that this is likely due to him not taking his blood pressure medication Recommend restarting his regimen comprised of amlodipine 5 mg p.o. daily Follow-up in about 4 weeks for monitoring

## 2023-02-05 NOTE — Telephone Encounter (Unsigned)
Copied from CRM [id number]. Topic: General - Other >> Feb 05, 2023 12:41 PM Turkey B wrote: Reason for CRM: Sonya from [place] , called in t oget info on who signed the home health orders on 08/30. Please call back

## 2023-02-06 ENCOUNTER — Ambulatory Visit (INDEPENDENT_AMBULATORY_CARE_PROVIDER_SITE_OTHER): Payer: 59 | Admitting: Plastic Surgery

## 2023-02-06 ENCOUNTER — Encounter: Payer: Self-pay | Admitting: Plastic Surgery

## 2023-02-06 VITALS — BP 151/95 | HR 87

## 2023-02-06 DIAGNOSIS — Z9889 Other specified postprocedural states: Secondary | ICD-10-CM

## 2023-02-06 NOTE — Telephone Encounter (Signed)
Spoke with a representative from [facility] and informed her that [employer], PA is here to fill in for Steve Grooms, NP while she is out on [employer]. Representative verbalized understanding and she will relay the message to [place] as well due to her being out the office.

## 2023-02-06 NOTE — Telephone Encounter (Signed)
I signed these for Steve [name] If they need to they can resend them with my info and I'll sign them again

## 2023-02-11 ENCOUNTER — Encounter: Payer: 59 | Attending: Physician Assistant | Admitting: Physician Assistant

## 2023-02-11 DIAGNOSIS — N493 Fournier gangrene: Secondary | ICD-10-CM | POA: Insufficient documentation

## 2023-02-11 DIAGNOSIS — I10 Essential (primary) hypertension: Secondary | ICD-10-CM | POA: Insufficient documentation

## 2023-02-11 DIAGNOSIS — E11622 Type 2 diabetes mellitus with other skin ulcer: Secondary | ICD-10-CM | POA: Diagnosis not present

## 2023-02-11 DIAGNOSIS — T8189XA Other complications of procedures, not elsewhere classified, initial encounter: Secondary | ICD-10-CM | POA: Diagnosis not present

## 2023-02-11 DIAGNOSIS — L98498 Non-pressure chronic ulcer of skin of other sites with other specified severity: Secondary | ICD-10-CM | POA: Insufficient documentation

## 2023-02-11 NOTE — Progress Notes (Signed)
Steve [name] ([id number]) [id number] Page 1 of 6 Visit Report for 02/11/2023 Chief Complaint Document Details Patient Name: Date of Service: Steve [name] [facility] [name] 02/11/2023 1:30 PM Medical Record Number: [id number] Patient Account Number: [id number] Date of Birth/Sex: Treating RN: [birth year] (41 y.o. 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 02/11/2023 1:30 PM Medical Record Number: [id number] Patient Account Number: [id number] Date of Birth/Sex: Treating RN: [birth year] (41 y.o. Steve [name] Primary Care Provider: Larae Grooms Other Clinician: Referring Provider: Treating Provider/Extender: Layne Benton in Treatment: 4 History of Present Illness HPI Description: 01-13-2023 upon evaluation today patient presents for a significant issue here regarding an issue where he had a Fournier gangrene which subsequently was manage inpatient. He ended up having to have obviously surgical intervention and unfortunately post intervention he does have now a significant wound which is in the perineal area which actually includes a portion of the scrotum as well that has been actually surgically removed and what appeared. Subsequently the testes are actually exposed to a degree and again this seems to be something that is more of a surgical things than just a wound. Thanks to me. I do not see a way that we can just get this to heal by second intent. I  think that he likely needs to see plastic surgery as soon as possible. I discussed that with the patient today and this is what the surgeon and the PA with the surgeon that I been discussing this case with as well has recommended but apparently plastic surgery wanted the patient seen by wound care first before they would proceed with any type of surgical intervention. The patient is currently on a Dexcom although he previously did have a significant elevation in his blood sugars back in July on the 28 2024 his A1c was 13.5. With that being said that is being worked on trying to get this under control. He does have a history again obviously of diabetes mellitus type 2, hypertension, and more recently the Fournier gangrene. The patient's wound actually started January 03, 2023 as a small abscess that then actually exponentially worsened. 01-21-2023 upon evaluation today patient appears to be doing actually better than what expected is regard to the wound. He actually does have an appointment with a plastic surgeon in coming days with: Plastic surgery. This is good news. Again the obvious goal is to try to see what can be done to get this close much more effectively and appropriately than just by second intent. The good news is he actually appears to have a significant mount of hypergranulation tissue over top of the testes as well as the area in general seems to want a healing. This is just taking a very long time unfortunately. I am also not certain that this is actually appropriately healing and the way that this is happening but again I am not sure what can be done differently hence the reason that we really want to see if there is a plastic surgery option towards getting this closed faster and more appropriately. Steve [name], Steve [name] ([id number]) [id number] Page 2 of 6 8/27; this is a patient who  had Fournier's gangrene involving the scrotum extending into the surrounding soft  tissue. Using wet-to-dry dressings that are being changed daily. We are making good progress. He is also followed by plastic surgery I think they are going to try to sew up part of the lower perineum but they are leaving the area over the testicle open to here heal via secondary intention 02-11-2023 patient has undergone plastic surgery with Dr. Ladona Ridgel. Subsequently this seems to be healing well. I could not see the base of the wound as he has Sorbact taped over top of what ever skin product was put on during the debridement and closure. That was on 01-31-2023. With that being said this actually comes off he tells me on Thursday and then will see where things go. I do not have any notes that can actually review right now in the system unfortunately from Dr. Ladona Ridgel and so I am unsure of exactly the plan. Nonetheless the patient tells me that Dr. Ladona Ridgel did want him to continue to come to wound care. Electronic Signature(s) Signed: 02/11/2023 2:09:13 PM By: Allen Derry PA-C Entered By: Allen Derry on 02/11/2023 11:09:13 -------------------------------------------------------------------------------- Physical Exam Details Patient Name: Date of Service: Steve [name] [facility] [name] 02/11/2023 1:30 PM Medical Record Number: [id number] Patient Account Number: [id number] Date of Birth/Sex: Treating RN: [birth year] (41 y.o. Steve [name] Primary Care Provider: Larae Grooms Other Clinician: Referring Provider: Treating Provider/Extender: Layne Benton in Treatment: 4 Constitutional Well-nourished and well-hydrated in no acute distress. Respiratory normal breathing without difficulty. Psychiatric this patient is able to make decisions and demonstrates good insight into disease process. Alert and Oriented x 3. pleasant and cooperative. Notes Upon inspection patient's wound bed actually showed signs again of being completely covered with Sorbact stapled in place and again there was  otherwise stable closure with regard to the wound in general. He is again status post plastic surgery 01-31-2023. Electronic Signature(s) Signed: 02/11/2023 2:09:37 PM By: Allen Derry PA-C Entered By: Allen Derry on 02/11/2023 11:09:37 -------------------------------------------------------------------------------- Physician Orders Details Patient Name: Date of Service: Steve [name] [name] 02/11/2023 1:30 PM Medical Record Number: [id number] Patient Account Number: [id number] Date of Birth/Sex: Treating RN: Apr 21, 1981 (41 y.o. Steve [name] Primary Care Provider: Larae Grooms Other Clinician: Referring Provider: Treating Provider/Extender: Layne Benton in Treatment: 4 [place] [place] [name] ([id number]) [id number] Page 3 of 6 Verbal / Phone Orders: No Diagnosis Coding Follow-up Appointments Return Appointment in 1 week. Bathing/ Shower/ Hygiene Other: - per surgeon recommendation , Additional Orders / Instructions Wound #1 Scrotum Other: - per surgeon recommendation , apply dakins 1/4 strength wet to dry dressing daily and cover with ABD pad. patient to follow up with surgeon in 2 days and return to wound clinic if stitched on sorbact removed and surgeon in agreement . Wound Treatment Electronic Signature(s) Signed: 02/11/2023 2:10:01 PM By: Yevonne Pax RN Signed: 02/13/2023 4:31:22 PM By: Allen Derry PA-C Entered By: Yevonne Pax on 02/11/2023 11:10:01 -------------------------------------------------------------------------------- Problem List Details Patient Name: Date of Service: Steve [name] [name] 02/11/2023 1:30 PM Medical Record Number: [id number] Patient Account Number: [id number] Date of Birth/Sex: Treating RN: [birth year] (41 y.o. Steve [name] Primary Care Provider: Larae Grooms Other Clinician: Referring Provider: Treating Provider/Extender: Layne Benton in Treatment: 4 Active  Problems ICD-10 Encounter Code Description Active Date MDM Diagnosis N49.3 Fournier gangrene 01/13/2023 No Yes L98.498 Non-pressure chronic ulcer of skin of other sites with other specified severity 01/13/2023  No Yes E11.622 Type 2 diabetes mellitus with other skin ulcer 01/13/2023 No Yes I10 Essential (primary) hypertension 01/13/2023 No Yes Inactive Problems Resolved Problems Electronic Signature(s) Signed: 02/11/2023 2:09:00 PM By: Benjamine Sprague, Steve [name] ([id number]) By: Dionne Ano [phone].pdf Page 4 of 6 Signed: 02/11/2023 2:09:00 PM Entered By: Allen Derry on 02/11/2023 11:09:00 -------------------------------------------------------------------------------- Progress Note Details Patient Name: Date of Service: Steve [name] [facility] [name] 02/11/2023 1:30 PM Medical Record Number: [id number] Patient Account Number: [id number] Date of Birth/Sex: Treating RN: 02/05/1981 (41 y.o. [name]) Yevonne Pax Primary Care Provider: Larae Grooms Other Clinician: Referring Provider: Treating Provider/Extender: Layne Benton in Treatment: 4 Subjective Chief Complaint Information obtained from Patient Open surgical wound due to Fournier gangrene History of Present Illness (HPI) 01-13-2023 upon evaluation today patient presents for a significant issue here regarding an issue where he had a Fournier gangrene which subsequently was manage inpatient. He ended up having to have obviously surgical intervention and unfortunately post intervention he does have now a significant wound which is in the perineal area which actually includes a portion of the scrotum as well that has been actually surgically removed and what appeared. Subsequently the testes are actually exposed to a degree and again this seems to be something that is more of a surgical things than just a wound. Thanks to me. I do not see a way that we can just get this to heal by second  intent. I think that he likely needs to see plastic surgery as soon as possible. I discussed that with the patient today and this is what the surgeon and the PA with the surgeon that I been discussing this case with as well has recommended but apparently plastic surgery wanted the patient seen by wound care first before they would proceed with any type of surgical intervention. The patient is currently on a Dexcom although he previously did have a significant elevation in his blood sugars back in July on the 28 2024 his A1c was 13.5. With that being said that is being worked on trying to get this under control. He does have a history again obviously of diabetes mellitus type 2, hypertension, and more recently the Fournier gangrene. The patient's wound actually started January 03, 2023 as a small abscess that then actually exponentially worsened. 01-21-2023 upon evaluation today patient appears to be doing actually better than what expected is regard to the wound. He actually does have an appointment with a plastic surgeon in coming days with: Plastic surgery. This is good news. Again the obvious goal is to try to see what can be done to get this close much more effectively and appropriately than just by second intent. The good news is he actually appears to have a significant mount of hypergranulation tissue over top of the testes as well as the area in general seems to want a healing. This is just taking a very long time unfortunately. I am also not certain that this is actually appropriately healing and the way that this is happening but again I am not sure what can be done differently hence the reason that we really want to see if there is a plastic surgery option towards getting this closed faster and more appropriately. 8/27; this is a patient who had Fournier's gangrene involving the scrotum extending into the surrounding soft tissue. Using wet-to-dry dressings that are being changed daily. We are  making good progress. He is also followed by plastic surgery I think  they are going to try to sew up part of the lower perineum but they are leaving the area over the testicle open to here heal via secondary intention 02-11-2023 patient has undergone plastic surgery with Dr. Ladona Ridgel. Subsequently this seems to be healing well. I could not see the base of the wound as he has Sorbact taped over top of what ever skin product was put on during the debridement and closure. That was on 01-31-2023. With that being said this actually comes off he tells me on Thursday and then will see where things go. I do not have any notes that can actually review right now in the system unfortunately from Dr. Ladona Ridgel and so I am unsure of exactly the plan. Nonetheless the patient tells me that Dr. Ladona Ridgel did want him to continue to come to wound care. Objective Constitutional Well-nourished and well-hydrated in no acute distress. Vitals Time Taken: 1:45 PM, Height: 74 in, Weight: 230 lbs, BMI: 29.5, Temperature: 98.3 F, Pulse: 76 bpm, Respiratory Rate: 16 breaths/min, Blood Pressure: 146/92 mmHg. Respiratory normal breathing without difficulty. Psychiatric this patient is able to make decisions and demonstrates good insight into disease process. Alert and Oriented x 3. pleasant and cooperative. Steve [name], Steve [name] ([id number]) [id number] Page 5 of 6 General Notes: Upon inspection patient's wound bed actually showed signs again of being completely covered with Sorbact stapled in place and again there was otherwise stable closure with regard to the wound in general. He is again status post plastic surgery 01-31-2023. 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 Based on what I am seeing I do believe that the patient  should continue to monitor for any signs of infection or worsening. Based on what I am seeing I do believe that he is making headway towards closure. 2. I again could not visualize the actual wound bed at this point due to the fact that he is going to have to be seen by plastic surgery and have the Sorbact removed and again that is not something that we were taking out here at this point. We will see patient back for reevaluation in 1 week here in the clinic. If anything worsens or changes patient will contact our office for additional recommendations. Electronic Signature(s) Signed: 02/11/2023 2:10:08 PM By: Allen Derry PA-C Entered By: Allen Derry on 02/11/2023 11:10:08 -------------------------------------------------------------------------------- SuperBill Details Patient Name: Date of Service: Steve [name] 02/11/2023 Medical Record Number: [id number] Patient Account Number: [id number] Date of Birth/Sex: Treating RN: [birth year] (41 y.o. [name]) Yevonne Pax Primary Care Provider: Larae Grooms Other Clinician: Referring Provider: Treating Provider/Extender: Layne Benton in Treatment: 4 Diagnosis Coding ICD-10 Codes Code Description N49.3 Fournier gangrene [phone] Non-pressure chronic ulcer of skin of other sites with other specified severity E11.622 Type 2 diabetes mellitus with other skin ulcer I10 Essential (primary) hypertension Facility Procedures : CPT4 Code: 24401027 Description: 25366 - WOUND CARE VISIT-LEV 2 EST PT Modifier: Quantity: 1 Physician Procedures : CPT4 Code Description Modifier Steve [name], Steve [name] ([id number]) [id number] - WC PHYS LEVEL 3 - EST PT 1 ICD-10 Diagnosis Description N49.3 Fournier gangrene L98.498 Non-pressure chronic ulcer of skin of other  sites with other specified severity E11.622 Type 2 diabetes mellitus with other skin ulcer I10 Essential (primary)  hypertension Quantity: Page 6 of 6 Electronic Signature(s) Signed: 02/11/2023 2:15:59 PM By: Allen Derry  PA-C Previous Signature: 02/11/2023 2:10:35 PM Version By: Yevonne Pax RN Entered By: Allen Derry on 02/11/2023 11:15:59

## 2023-02-12 ENCOUNTER — Emergency Department: Payer: 59

## 2023-02-12 ENCOUNTER — Emergency Department
Admission: EM | Admit: 2023-02-12 | Discharge: 2023-02-12 | Disposition: A | Discharge status: Home / Self Care | Payer: 59 | Attending: Emergency Medicine | Admitting: Emergency Medicine

## 2023-02-12 DIAGNOSIS — R0602 Shortness of breath: Secondary | ICD-10-CM | POA: Insufficient documentation

## 2023-02-12 DIAGNOSIS — S2241XA Multiple fractures of ribs, right side, initial encounter for closed fracture: Secondary | ICD-10-CM | POA: Insufficient documentation

## 2023-02-12 DIAGNOSIS — R6889 Other general symptoms and signs: Secondary | ICD-10-CM | POA: Diagnosis not present

## 2023-02-12 DIAGNOSIS — Y92410 Unspecified street and highway as the place of occurrence of the external cause: Secondary | ICD-10-CM | POA: Diagnosis not present

## 2023-02-12 DIAGNOSIS — S299XXA Unspecified injury of thorax, initial encounter: Secondary | ICD-10-CM | POA: Diagnosis present

## 2023-02-12 MED ORDER — OXYCODONE-ACETAMINOPHEN 5-325 MG PO TABS: 2.0000 | ORAL_TABLET | Freq: Three times a day (TID) | ORAL | 0 refills | Status: DC | PRN

## 2023-02-12 MED ORDER — DOCUSATE SODIUM 100 MG PO CAPS
ORAL_CAPSULE | ORAL | 0 refills | Status: DC
Start: 2023-02-12 — End: 2024-02-04

## 2023-02-12 MED ORDER — OXYCODONE-ACETAMINOPHEN 5-325 MG PO TABS
2.0000 | ORAL_TABLET | Freq: Once | ORAL | Status: AC
  Administered 2023-02-12: 2 via ORAL
  Filled 2023-02-12: qty 2
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
## 2023-02-12 NOTE — Discharge Instructions (Signed)
Your workup today showed that you have a fracture to one or more ribs.  Unfortunately this type of injury hurts but there is no way to fix it immediately; it must heal over time.  Be sure to take plenty of deep breaths so that you get rid of the "bad air" in your lungs.  If you are given a device called an incentive spirometer, please use it as recommended. ° °Unless you have been told by your doctor not to do so, we recommend you take ibuprofen 600 mg 3 times daily with meals for no more than 5 days.  You can also take Tylenol 1000 mg every 6 hours for pain. ° °Take Percocet as prescribed for severe pain. Do not drink alcohol, drive or participate in any other potentially dangerous activities while taking this medication as it may make you sleepy. Do not take this medication with any other sedating medications, either prescription or over-the-counter. If you were prescribed Percocet or Vicodin, do not take these with acetaminophen (Tylenol) as it is already contained within these medications. °  °This medication is an opiate (or narcotic) pain medication and can be habit forming.  Use it as little as possible to achieve adequate pain control.  Do not use or use it with extreme caution if you have a history of opiate abuse or dependence.  If you are on a pain contract with your primary care doctor or a pain specialist, be sure to let them know you were prescribed this medication today from the [facility].  This medication is intended for your use only - do not give any to anyone else and keep it in a secure place where nobody else, especially children, have access to it.  It will also cause or worsen constipation, so you may want to consider taking an over-the-counter stool softener while you are taking this medication. ° °Follow-up at the clinics or with the doctors described in this paperwork.  Return to the emergency department if he develop new or worsening symptoms that concern  you. °
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
## 2023-02-13 ENCOUNTER — Encounter: Payer: Self-pay | Admitting: Surgical

## 2023-02-13 ENCOUNTER — Ambulatory Visit (INDEPENDENT_AMBULATORY_CARE_PROVIDER_SITE_OTHER): Payer: 59 | Admitting: Surgical

## 2023-02-13 VITALS — BP 167/102 | Ht 74.0 in | Wt 219.6 lb

## 2023-02-13 DIAGNOSIS — N493 Fournier gangrene: Secondary | ICD-10-CM

## 2023-02-13 NOTE — Progress Notes (Signed)
Patient is a 42 year old male here for follow-up after debridement of scrotal and perineal wounds, primary closure of perineal wound and placement of myriad wound matrix with Dr. Ladona Ridgel on 01/31/2023.  He is approximately 2 weeks postop.  The Sorbact mesh and staples are still in place.  Patient reports he is doing well, he has been tolerating dressing changes without any issues.  He is not having any infectious symptoms.  Chaperone present on exam On exam perineal incision is intact and healing very well, staples are in place.  He does have 1 area with pinpoint hypergranulation tissue present.  There is no erythema or cellulitic change.  No active drainage noted.  The perineal incision is clean  On exam of the scrotum, wound is approximately 3 x 3 cm, healthy granulation tissue is noted at the base.  There is no surrounding erythema or cellulitic changes.  No foul odors are noted.  Sorbact dressing was removed as well as all of the Prolene sutures.  A/P:  Recommend continuing with twice daily dressing changes with Adaptic, K-Y jelly, 4 x 4 gauze, ABD pad.  Recommend keeping the area clean.  Avoid strenuous activities.  Recommend following up in 2 weeks for reevaluation.  Pictures were taken and placed in the patient's chart with patient's permission.

## 2023-02-14 NOTE — Progress Notes (Signed)
Steve [name], Steve [name] ([id number]) [id number] Page 1 of 7 Visit Report for 02/11/2023 Arrival Information Details Patient Name: Date of Service: Steve [name] [facility] [name] 02/11/2023 1:30 PM Medical Record Number: [id number] Patient Account Number: [id number] Date of Birth/Sex: Treating RN: 08/22/1980 (42 y.o. [name]) Yevonne Pax Primary Care Jimi Schappert: Larae Grooms Other Clinician: Referring Henriette Hesser: Treating Amery Vandenbos/Extender: Layne Benton in Treatment: 4 Visit Information History Since Last Visit All ordered tests and consults were completed: Yes Patient Arrived: Ambulatory Added or deleted any medications: No Arrival Time: 13:45 Any new allergies or adverse reactions: No Accompanied By: self Had a fall or experienced change in No Transfer Assistance: None activities of daily living that may affect Patient Identification Verified: Yes risk of falls: Secondary Verification Process Completed: Yes Signs or symptoms of abuse/neglect since last visito No Patient Requires Transmission-Based Precautions: No Hospitalized since last visit: No Patient Has Alerts: No Implantable device outside of the clinic excluding No cellular tissue based products placed in the center since last visit: Has Dressing in Place as Prescribed: Yes Pain Present Now: No Electronic Signature(s) Signed: 02/14/2023 1:15:19 PM By: Yevonne Pax RN Entered By: Yevonne Pax on 02/11/2023 13:45:35 -------------------------------------------------------------------------------- Clinic Level of Care Assessment Details Patient Name: Date of Service: Steve [name] [facility] [name] 02/11/2023 1:30 PM Medical Record Number: [id number] Patient Account Number: [id number] Date of Birth/Sex: Treating RN: [birth year] (42 y.o. [name] Primary Care Barbie Croston: Larae Grooms Other Clinician: Referring Menaal Russum: Treating Amaliya Whitelaw/Extender: Layne Benton in  Treatment: 4 Clinic Level of Care Assessment Items TOOL 4 Quantity Score X- 1 0 Use when only an EandM is performed on FOLLOW-UP visit ASSESSMENTS - Nursing Assessment / Reassessment X- 1 10 Reassessment of Co-morbidities (includes updates in patient status) X- 1 5 Reassessment of Adherence to Treatment Plan TRUST, SCHOENEMANN [name] ([id number]) [id number] Page 2 of 7 ASSESSMENTS - Wound and Skin A ssessment / Reassessment []  - 0 Simple Wound Assessment / Reassessment - one wound []  - 0 Complex Wound Assessment / Reassessment - multiple wounds []  - 0 Dermatologic / Skin Assessment (not related to wound area) ASSESSMENTS - Focused Assessment []  - 0 Circumferential Edema Measurements - multi extremities []  - 0 Nutritional Assessment / Counseling / Intervention []  - 0 Lower Extremity Assessment (monofilament, tuning fork, pulses) []  - 0 Peripheral Arterial Disease Assessment (using hand held doppler) ASSESSMENTS - Ostomy and/or Continence Assessment and Care []  - 0 Incontinence Assessment and Management []  - 0 Ostomy Care Assessment and Management (repouching, etc.) PROCESS - Coordination of Care X - Simple Patient / Family Education for ongoing care 1 15 []  - 0 Complex (extensive) Patient / Family Education for ongoing care []  - 0 Staff obtains Chiropractor, Records, [name] Results / Process Orders est []  - 0 Staff telephones HHA, Nursing Homes / Clarify orders / etc []  - 0 Routine Transfer to another Facility (non-emergent condition) []  - 0 Routine Hospital Admission (non-emergent condition) []  - 0 New Admissions / Manufacturing engineer / Ordering NPWT Apligraf, etc. , []  - 0 Emergency Hospital Admission (emergent condition) X- 1 10 Simple Discharge Coordination []  - 0 Complex (extensive) Discharge Coordination PROCESS - Special Needs []  - 0 Pediatric / Minor Patient Management []  - 0 Isolation Patient Management []  - 0 Hearing / Language /  Visual special needs []  - 0 Assessment of Community assistance (transportation, D/C planning, etc.) []  - 0 Additional assistance / Altered mentation []  - 0 Support Surface(s) Assessment (bed, cushion, seat, etc.) INTERVENTIONS -  [id number] DAXTON, Steve [name] ([id number]) [id number] Page 5 of 7 Necrotic Tissue Nursing Diagnoses: Knowledge deficit related to management of necrotic/devitalized tissue Goals: Patient/caregiver will verbalize understanding of reason and process for debridement of necrotic tissue Date Initiated: 01/13/2023 Target Resolution Date: 02/13/2023 Goal Status: Active Interventions: Assess patient pain level pre-, during and post procedure and prior to discharge Treatment Activities: Apply topical anesthetic as ordered : 01/13/2023 Notes: Nutrition Nursing Diagnoses: Potential for alteratiion in Nutrition/Potential for imbalanced nutrition Goals: Patient/caregiver verbalizes understanding of need to maintain therapeutic glucose control per primary care physician Date Initiated: 01/13/2023 Target Resolution Date: 02/13/2023 Goal Status: Active Interventions: Assess HgA1c results as ordered upon admission and as needed Notes: Wound/Skin Impairment Nursing Diagnoses: Knowledge deficit related to ulceration/compromised skin integrity Goals: Patient/caregiver will verbalize understanding of skin care regimen Date  Initiated: 01/13/2023 Target Resolution Date: 02/13/2023 Goal Status: Active Ulcer/skin breakdown will have a volume reduction of 30% by week 4 Date Initiated: 01/13/2023 Target Resolution Date: 02/13/2023 Goal Status: Active Ulcer/skin breakdown will have a volume reduction of 50% by week 8 Date Initiated: 01/13/2023 Target Resolution Date: 03/15/2023 Goal Status: Active Ulcer/skin breakdown will have a volume reduction of 80% by week 12 Date Initiated: 01/13/2023 Target Resolution Date: 04/15/2023 Goal Status: Active Ulcer/skin breakdown will heal within 14 weeks Date Initiated: 01/13/2023 Target Resolution Date: 05/15/2023 Goal Status: Active Interventions: Assess patient/caregiver ability to obtain necessary supplies Assess patient/caregiver ability to perform ulcer/skin care regimen upon admission and as needed Assess ulceration(s) every visit Notes: Electronic Signature(s) Signed: 02/11/2023 2:10:48 PM By: Yevonne Pax RN Entered By: Yevonne Pax on 02/11/2023 14:10:47 Steve [name] ([id number]) [id number] Page 6 of 7 -------------------------------------------------------------------------------- Pain Assessment Details Patient Name: Date of Service: Steve [name] [name] [name] 02/11/2023 1:30 PM Medical Record Number: [id number] Patient Account Number: [id number] Date of Birth/Sex: Treating RN: [birth year] (42 y.o. [name]) Yevonne Pax Primary Care Britton Perkinson: Larae Grooms Other Clinician: Referring Karoline Fleer: Treating Neoma Uhrich/Extender: Layne Benton in Treatment: 4 Active Problems Location of Pain Severity and Description of Pain Patient Has Paino No Site Locations Pain Management and Medication Current Pain Management: Electronic Signature(s) Signed: 02/14/2023 1:15:19 PM By: Yevonne Pax RN Entered By: Yevonne Pax on 02/11/2023  13:46:43 -------------------------------------------------------------------------------- Patient/Caregiver Education Details Patient Name: Date of Service: Steve [name] 9/10/2024andnbsp1:30 PM Medical Record Number: [id number] Patient Account Number: [id number] Date of Birth/Gender: Treating RN: 1981/02/06 (42 y.o. [name] Primary Care Physician: Larae Grooms Other Clinician: Referring Physician: Treating Physician/Extender: Layne Benton in Treatment: 4 Steve [name], Steve [name] ([id number]) [id number] Page 7 of 7 Education Assessment Education Provided To: Patient Education Topics Provided Infection: Handouts: Hygiene and Infection Prevention Methods: Explain/Verbal Responses: State content correctly Electronic Signature(s) Signed: 02/14/2023 1:15:19 PM By: Yevonne Pax RN Entered By: Yevonne Pax on 02/11/2023 14:11:23 -------------------------------------------------------------------------------- Vitals Details Patient Name: Date of Service: Steve [name] 02/11/2023 1:30 PM Medical Record Number: [id number] Patient Account Number: [id number] Date of Birth/Sex: Treating RN: 01-21-1981 (42 y.o. [name] Primary Care Brianah Hopson: Larae Grooms Other Clinician: Referring Jonasia Coiner: Treating Amalya Salmons/Extender: Layne Benton in Treatment: 4 Vital Signs Time Taken: 13:45 Temperature (F): 98.3 Height (in): 74 Pulse (bpm): 76 Weight (lbs): 230 Respiratory Rate (breaths/min): 16 Body Mass Index (BMI): 29.5 Blood Pressure (mmHg): 146/92 Reference Range: 80 - 120 mg / dl Electronic Signature(s) Signed: 02/14/2023 1:15:19 PM By: Yevonne Pax RN Entered By: Yevonne Pax on 02/11/2023 13:46:34  [address] DAXTON, Steve [name] ([id number]) [id number] Page 5 of 7 Necrotic Tissue Nursing Diagnoses: Knowledge deficit related to management of necrotic/devitalized tissue Goals: Patient/caregiver will verbalize understanding of reason and process for debridement of necrotic tissue Date Initiated: 01/13/2023 Target Resolution Date: 02/13/2023 Goal Status: Active Interventions: Assess patient pain level pre-, during and post procedure and prior to discharge Treatment Activities: Apply topical anesthetic as ordered : 01/13/2023 Notes: Nutrition Nursing Diagnoses: Potential for alteratiion in Nutrition/Potential for imbalanced nutrition Goals: Patient/caregiver verbalizes understanding of need to maintain therapeutic glucose control per primary care physician Date Initiated: 01/13/2023 Target Resolution Date: 02/13/2023 Goal Status: Active Interventions: Assess HgA1c results as ordered upon admission and as needed Notes: Wound/Skin Impairment Nursing Diagnoses: Knowledge deficit related to ulceration/compromised skin integrity Goals: Patient/caregiver will verbalize understanding of skin care regimen Date  Initiated: 01/13/2023 Target Resolution Date: 02/13/2023 Goal Status: Active Ulcer/skin breakdown will have a volume reduction of 30% by week 4 Date Initiated: 01/13/2023 Target Resolution Date: 02/13/2023 Goal Status: Active Ulcer/skin breakdown will have a volume reduction of 50% by week 8 Date Initiated: 01/13/2023 Target Resolution Date: 03/15/2023 Goal Status: Active Ulcer/skin breakdown will have a volume reduction of 80% by week 12 Date Initiated: 01/13/2023 Target Resolution Date: 04/15/2023 Goal Status: Active Ulcer/skin breakdown will heal within 14 weeks Date Initiated: 01/13/2023 Target Resolution Date: 05/15/2023 Goal Status: Active Interventions: Assess patient/caregiver ability to obtain necessary supplies Assess patient/caregiver ability to perform ulcer/skin care regimen upon admission and as needed Assess ulceration(s) every visit Notes: Electronic Signature(s) Signed: 02/11/2023 2:10:48 PM By: Yevonne Pax RN Entered By: Yevonne Pax on 02/11/2023 14:10:47 Steve [name] ([id number]) [id number] Page 6 of 7 -------------------------------------------------------------------------------- Pain Assessment Details Patient Name: Date of Service: Steve [name] [name] [name] 02/11/2023 1:30 PM Medical Record Number: [id number] Patient Account Number: [id number] Date of Birth/Sex: Treating RN: [birth year] (42 y.o. [name]) Yevonne Pax Primary Care Britton Perkinson: Larae Grooms Other Clinician: Referring Karoline Fleer: Treating Neoma Uhrich/Extender: Layne Benton in Treatment: 4 Active Problems Location of Pain Severity and Description of Pain Patient Has Paino No Site Locations Pain Management and Medication Current Pain Management: Electronic Signature(s) Signed: 02/14/2023 1:15:19 PM By: Yevonne Pax RN Entered By: Yevonne Pax on 02/11/2023  13:46:43 -------------------------------------------------------------------------------- Patient/Caregiver Education Details Patient Name: Date of Service: Steve [name] 9/10/2024andnbsp1:30 PM Medical Record Number: [id number] Patient Account Number: [id number] Date of Birth/Gender: Treating RN: 1981/02/06 (42 y.o. [name] Primary Care Physician: Larae Grooms Other Clinician: Referring Physician: Treating Physician/Extender: Layne Benton in Treatment: 4 Steve [name], Steve [name] ([id number]) [id number] Page 7 of 7 Education Assessment Education Provided To: Patient Education Topics Provided Infection: Handouts: Hygiene and Infection Prevention Methods: Explain/Verbal Responses: State content correctly Electronic Signature(s) Signed: 02/14/2023 1:15:19 PM By: Yevonne Pax RN Entered By: Yevonne Pax on 02/11/2023 14:11:23 -------------------------------------------------------------------------------- Vitals Details Patient Name: Date of Service: Steve [name] 02/11/2023 1:30 PM Medical Record Number: [id number] Patient Account Number: [id number] Date of Birth/Sex: Treating RN: 01-21-1981 (42 y.o. [name] Primary Care Brianah Hopson: Larae Grooms Other Clinician: Referring Jonasia Coiner: Treating Amalya Salmons/Extender: Layne Benton in Treatment: 4 Vital Signs Time Taken: 13:45 Temperature (F): 98.3 Height (in): 74 Pulse (bpm): 76 Weight (lbs): 230 Respiratory Rate (breaths/min): 16 Body Mass Index (BMI): 29.5 Blood Pressure (mmHg): 146/92 Reference Range: 80 - 120 mg / dl Electronic Signature(s) Signed: 02/14/2023 1:15:19 PM By: Yevonne Pax RN Entered By: Yevonne Pax on 02/11/2023 13:46:34

## 2023-02-18 ENCOUNTER — Ambulatory Visit: Payer: 59 | Admitting: Physician Assistant

## 2023-02-19 ENCOUNTER — Encounter: Payer: 59 | Admitting: Surgical

## 2023-02-19 NOTE — Progress Notes (Signed)
Mr. Theodorou returns approximately a week after wound closure and placement of tissue replacement matrix.  He is doing well with no complaints.  We discussed continue dressing changes and wound care.  He will keep follow-up appointment as scheduled.

## 2023-02-20 DIAGNOSIS — E1165 Type 2 diabetes mellitus with hyperglycemia: Secondary | ICD-10-CM | POA: Diagnosis not present

## 2023-02-20 DIAGNOSIS — E785 Hyperlipidemia, unspecified: Secondary | ICD-10-CM | POA: Diagnosis not present

## 2023-02-20 DIAGNOSIS — I10 Essential (primary) hypertension: Secondary | ICD-10-CM | POA: Diagnosis not present

## 2023-02-20 DIAGNOSIS — E7030 Albinism, unspecified: Secondary | ICD-10-CM | POA: Diagnosis not present

## 2023-02-20 DIAGNOSIS — Z5181 Encounter for therapeutic drug level monitoring: Secondary | ICD-10-CM | POA: Diagnosis not present

## 2023-02-20 DIAGNOSIS — A419 Sepsis, unspecified organism: Secondary | ICD-10-CM | POA: Diagnosis not present

## 2023-02-20 DIAGNOSIS — Z792 Long term (current) use of antibiotics: Secondary | ICD-10-CM | POA: Diagnosis not present

## 2023-02-20 DIAGNOSIS — I700 Atherosclerosis of aorta: Secondary | ICD-10-CM | POA: Diagnosis not present

## 2023-02-25 NOTE — Progress Notes (Unsigned)
Patient is a 42 year old male here for follow-up after debridement of scrotal wound and perineal wounds, primary closure of perineal wound and placement of myriad wound matrix with Dr. Ladona Ridgel on 01/31/2023.  He is approximately 3-1/2 weeks postop. At his last appointment, Sorbact mesh was removed, all the staples were also removed.  Patient reports he is doing really well today, he has been doing Adaptic, K-Y jelly, 4 4 gauze, ABD pads for dressing changes and he feels that this is going well.  He does report he has had some difficulty keeping the dressings in place due to the location.  He is not having any infectious symptoms.  He feels as if the area is healing well.  He does not have any specific concerns today.  On exam scrotal wound is approximately 2.5 x 2.5 cm.  There is no surrounding erythema or cellulitic changes noted.  Perineal incision is completely intact and well-healed.  There is no foul odors or active drainage noted.  A/P:  Patient is doing well, recommend continuing with K-Y jelly, 4 x 4 gauze, ABD pad dressing changes daily.  Recommend continue to keep the area clean.  Additional Monocryl sutures were removed, patient tolerated this well.  Recommend following up in 3 weeks for reevaluation.  I suspect that at this time it would likely be completely healed.  No signs of infection or concern on exam.  Call with questions or concerns.  Pictures were obtained of the patient and placed in the chart with the patient's or guardian's permission.

## 2023-02-26 ENCOUNTER — Telehealth: Payer: Self-pay | Admitting: Nurse Practitioner

## 2023-02-26 ENCOUNTER — Encounter: Payer: Self-pay | Admitting: Infectious Diseases

## 2023-02-26 ENCOUNTER — Encounter: Payer: Self-pay | Admitting: Surgical

## 2023-02-26 ENCOUNTER — Ambulatory Visit (INDEPENDENT_AMBULATORY_CARE_PROVIDER_SITE_OTHER): Payer: 59 | Admitting: Surgical

## 2023-02-26 VITALS — BP 107/69 | HR 89

## 2023-02-26 DIAGNOSIS — N493 Fournier gangrene: Secondary | ICD-10-CM | POA: Diagnosis not present

## 2023-02-26 NOTE — Telephone Encounter (Signed)
Steve [name] from [facility] called in, checking status of order for plan of care and wound care has been received. She faxed this on 09/20 Please cb

## 2023-02-27 ENCOUNTER — Ambulatory Visit (INDEPENDENT_AMBULATORY_CARE_PROVIDER_SITE_OTHER): Payer: 59 | Admitting: Physician Assistant

## 2023-02-27 ENCOUNTER — Encounter: Payer: Self-pay | Admitting: Physician Assistant

## 2023-02-27 VITALS — BP 105/67 | HR 78 | Temp 98.4°F | Wt 222.0 lb

## 2023-02-27 DIAGNOSIS — E1165 Type 2 diabetes mellitus with hyperglycemia: Secondary | ICD-10-CM

## 2023-02-27 DIAGNOSIS — N493 Fournier gangrene: Secondary | ICD-10-CM

## 2023-02-27 DIAGNOSIS — Z7985 Long-term (current) use of injectable non-insulin antidiabetic drugs: Secondary | ICD-10-CM | POA: Diagnosis not present

## 2023-02-27 MED ORDER — FREESTYLE LIBRE 14 DAY READER DEVI
1.0000 | 0 refills | Status: DC
Start: 2023-02-27 — End: 2024-02-04

## 2023-02-27 MED ORDER — FREESTYLE LIBRE 14 DAY SENSOR MISC
1.0000 | 3 refills | Status: DC
Start: 2023-02-27 — End: 2024-02-04

## 2023-02-27 MED ORDER — TIRZEPATIDE 5 MG/0.5ML ~~LOC~~ SOAJ
5.0000 mg | SUBCUTANEOUS | 1 refills | Status: DC
Start: 2023-02-27 — End: 2024-02-04

## 2023-02-28 NOTE — Assessment & Plan Note (Signed)
Chronic, historic condition Too early to recheck A1c today- recheck at next follow up He reports he has not been able to get a Freestyle Libre at his pharmacy due to stock issues Will send generic script for all models of Freestyle Libre and reader device if needed so he can use CGM  Will increase Mounjaro to 5 mg weekly dosing as he has completed one month at starting dose without side effects Recommend follow up in about 4 weeks for monitoring and to recheck A1c/ labs

## 2023-02-28 NOTE — Assessment & Plan Note (Signed)
Reviewed most recent apt notes from plastics  Appears to be healing well from most recent surgery See A&P synopsis below:  Plastics A&P from 02/26/23  On exam scrotal wound is approximately 2.5 x 2.5 cm. There is no surrounding erythema or cellulitic changes noted. Perineal incision is completely intact and well-healed. There is no foul odors or active drainage noted.  Patient is doing well, recommend continuing with K-Y jelly, 4 x 4 gauze, ABD pad dressing changes daily. Recommend continue to keep the area clean. Additional Monocryl sutures were removed, patient tolerated this well. Recommend following up in 3 weeks for reevaluation.   Will continue to defer to their recommendations and further optimize DM control with CGM and medications

## 2023-03-04 NOTE — Telephone Encounter (Signed)
Called and informed Lamar Laundry that we received this paperwork by fax today 03/04/2023.  Will be placed in provider's folder for completion.

## 2023-03-04 NOTE — Telephone Encounter (Signed)
Steve [name] from [facility] is calling to f/u on status of order for plan of care and wound care. Asking if it has been received. She faxed this on 09/20 and is sending fax again today.   Requesting an update as soon as possible.

## 2023-03-05 ENCOUNTER — Encounter: Payer: 59 | Admitting: Surgical

## 2023-03-05 NOTE — Telephone Encounter (Signed)
Followed up with Steve [name] regarding this paperwork, she stated that she received all but the first page.  She re-faxed it back and I put the paper that she was missing in provider's folder.

## 2023-03-05 NOTE — Telephone Encounter (Signed)
Sonya from [facility] called in again, asking if ordered was received for wound care that she faxed in yesterday. Please cb

## 2023-03-05 NOTE — Telephone Encounter (Signed)
Paper work has been faxed

## 2023-03-18 ENCOUNTER — Ambulatory Visit: Payer: 59 | Admitting: Physician Assistant

## 2023-03-19 ENCOUNTER — Ambulatory Visit: Payer: 59 | Admitting: Surgical

## 2023-03-19 DIAGNOSIS — Z09 Encounter for follow-up examination after completed treatment for conditions other than malignant neoplasm: Secondary | ICD-10-CM | POA: Diagnosis not present

## 2023-03-19 DIAGNOSIS — Z9889 Other specified postprocedural states: Secondary | ICD-10-CM

## 2023-03-19 DIAGNOSIS — N493 Fournier gangrene: Secondary | ICD-10-CM | POA: Diagnosis not present

## 2023-03-19 NOTE — Progress Notes (Signed)
   Referring Provider Larae Grooms, NP [address] [place],  [place] [address]   CC:  Chief Complaint  Patient presents with   Post-op Follow-up      Steve [name] is an 42 y.o. adult.  HPI: Patient is a 42 year old male here for follow-up after debridement of scrotal wounds, perineal wounds and primary closure and placement of myriad wound matrix with Dr. Ladona Ridgel on 01/31/2023.  He reports he is doing well.  He is not having any issues at this time.  He does have some questions about the scarring and firmness of the areas that healed via secondary intention.  [name] is very happy that the area has healed, he is not having any here concerns or issues today.  Review of Systems General: No fevers or chills  Physical Exam    02/27/2023   10:00 AM 02/26/2023    9:04 AM 02/13/2023    2:18 PM  Vitals with BMI  Height   6\' 2"   Weight 222 lbs  219 lbs 10 oz  BMI 28.49  28.18  Systolic 105 107 295  Diastolic 67 69 102  Pulse 78 89     General:  No acute distress,  Alert and oriented, Non-Toxic, Normal speech and affect Scrotal wounds and perineal incision completely intact and well-healed.  There is no erythema or cellulitic changes.  No tenderness noted on exam.   Assessment/Plan Patient is doing well, the area is completely healed.  Recommend following up as needed.  Call with questions or concerns.  No signs infection or concern on exam.  Kermit Balo Raiza Kiesel 03/19/2023, 1:21 PM

## 2023-03-27 ENCOUNTER — Encounter: Payer: 59 | Attending: Physician Assistant | Admitting: Physician Assistant

## 2023-03-27 DIAGNOSIS — E1152 Type 2 diabetes mellitus with diabetic peripheral angiopathy with gangrene: Secondary | ICD-10-CM | POA: Insufficient documentation

## 2023-03-27 DIAGNOSIS — N493 Fournier gangrene: Secondary | ICD-10-CM | POA: Diagnosis not present

## 2023-03-27 DIAGNOSIS — I10 Essential (primary) hypertension: Secondary | ICD-10-CM | POA: Diagnosis not present

## 2023-03-27 DIAGNOSIS — L98498 Non-pressure chronic ulcer of skin of other sites with other specified severity: Secondary | ICD-10-CM | POA: Diagnosis not present

## 2023-03-27 DIAGNOSIS — E119 Type 2 diabetes mellitus without complications: Secondary | ICD-10-CM | POA: Diagnosis not present

## 2023-03-27 DIAGNOSIS — E11622 Type 2 diabetes mellitus with other skin ulcer: Secondary | ICD-10-CM | POA: Insufficient documentation

## 2023-03-27 NOTE — Progress Notes (Addendum)
ESPEN, LORINCZ [name] ([id number]) [id number] Page 1 of 6 Visit Report for 03/27/2023 Arrival Information Details Patient Name: Date of Service: Steve [name] [facility] [name] 03/27/2023 2:30 PM Medical Record Number: [id number] Patient Account Number: [id number] Date of Birth/Sex: Treating RN: 1980-10-02 (42 y.o. Steve [name]) Steve [name] Primary Care Steve [name]: Steve [name] Other Clinician: Referring Steve [name]: Treating Steve [name]/Extender: Steve [name] in Treatment: 10 Visit Information History Since Last Visit Added or deleted any medications: No Patient Arrived: Ambulatory Any new allergies or adverse reactions: No Arrival Time: 14:36 Had a fall or experienced change in No Accompanied By: self activities of daily living that may affect Transfer Assistance: None risk of falls: Patient Identification Verified: Yes Signs or symptoms of abuse/neglect since last visito No Secondary Verification Process Completed: Yes Hospitalized since last visit: No Patient Requires Transmission-Based Precautions: No Implantable device outside of the clinic excluding No Patient Has Alerts: No cellular tissue based products placed in the center since last visit: Has Dressing in Place as Prescribed: Yes Pain Present Now: No Electronic Signature(s) Signed: 03/31/2023 8:00:25 AM By: Steve Pax RN Entered By: Steve [name] on 03/27/2023 11:36:36 -------------------------------------------------------------------------------- Clinic Level of Care Assessment Details Patient Name: Date of Service: Steve [name] [facility] [name] 03/27/2023 2:30 PM Medical Record Number: [id number] Patient Account Number: [id number] Date of Birth/Sex: Treating RN: Oct 14, 1980 (42 y.o. Steve [name] Primary Care Steve [name]: Steve [name] Other Clinician: Referring Steve [name]: Treating Steve [name]/Extender: Steve [name] in Treatment: 10 Clinic Level of Care Assessment  Items TOOL 4 Quantity Score X- 1 0 Use when only an EandM is performed on FOLLOW-UP visit ASSESSMENTS - Nursing Assessment / Reassessment X- 1 10 Reassessment of Co-morbidities (includes updates in patient status) X- 1 5 Reassessment of Adherence to Treatment Plan Steve [name], Steve [name] ([id number]) [phone].pdf Page 2 of 6 ASSESSMENTS - Wound and Skin A ssessment / Reassessment []  - Simple Wound Assessment / Reassessment - one wound 0 []  - 0 Complex Wound Assessment / Reassessment - multiple wounds []  - 0 Dermatologic / Skin Assessment (not related to wound area) ASSESSMENTS - Focused Assessment []  - 0 Circumferential Edema Measurements - multi extremities []  - 0 Nutritional Assessment / Counseling / Intervention []  - 0 Lower Extremity Assessment (monofilament, tuning fork, pulses) []  - 0 Peripheral Arterial Disease Assessment (using hand held doppler) ASSESSMENTS - Ostomy and/or Continence Assessment and Care []  - 0 Incontinence Assessment and Management []  - 0 Ostomy Care Assessment and Management (repouching, etc.) PROCESS - Coordination of Care X - Simple Patient / Family Education for ongoing care 1 15 []  - 0 Complex (extensive) Patient / Family Education for ongoing care []  - 0 Staff obtains Chiropractor, Records, [name] Results / Process Orders est []  - 0 Staff telephones HHA, Nursing Homes / Clarify orders / etc []  - 0 Routine Transfer to another Facility (non-emergent condition) []  - 0 Routine Hospital Admission (non-emergent condition) []  - 0 New Admissions / Manufacturing engineer / Ordering NPWT Apligraf, etc. , []  - 0 Emergency Hospital Admission (emergent condition) X- 1 10 Simple Discharge Coordination []  - 0 Complex (extensive) Discharge Coordination PROCESS - Special Needs []  - 0 Pediatric / Minor Patient Management []  - 0 Isolation Patient Management []  - 0 Hearing / Language / Visual special needs []  - 0 Assessment of  Community assistance (transportation, D/C planning, etc.) []  - 0 Additional assistance / Altered mentation []  - 0 Support Surface(s) Assessment (bed, cushion, seat, etc.) INTERVENTIONS - Wound Cleansing / Measurement []  - 0  Simple Wound Cleansing - one wound []  - 0 Complex Wound Cleansing - multiple wounds []  - 0 Wound Imaging (photographs - any number of wounds) []  - 0 Wound Tracing (instead of photographs) []  - 0 Simple Wound Measurement - one wound []  - 0 Complex Wound Measurement - multiple wounds INTERVENTIONS - Wound Dressings []  - 0 Small Wound Dressing one or multiple wounds []  - 0 Medium Wound Dressing one or multiple wounds []  - 0 Large Wound Dressing one or multiple wounds []  - 0 Application of Medications - topical []  - 0 Application of Medications - injection INTERVENTIONS - Miscellaneous []  - 0 External ear exam Steve [name], Steve [name] ([id number]) [phone].pdf Page 3 of 6 []  - 0 Specimen Collection (cultures, biopsies, blood, body fluids, etc.) []  - 0 Specimen(s) / Culture(s) sent or taken to Lab for analysis []  - 0 Patient Transfer (multiple staff / Michiel Sites Lift / Similar devices) []  - 0 Simple Staple / Suture removal (25 or less) []  - 0 Complex Staple / Suture removal (26 or more) []  - 0 Hypo / Hyperglycemic Management (close monitor of Blood Glucose) []  - 0 Ankle / Brachial Index (ABI) - do not check if billed separately X- 1 5 Vital Signs Has the patient been seen at the hospital within the last three years: Yes Total Score: 45 Level Of Care: New/Established - Level 2 Electronic Signature(s) Signed: 03/31/2023 8:00:25 AM By: Steve Pax RN Entered By: Steve [name] on 03/27/2023 12:09:19 -------------------------------------------------------------------------------- Encounter Discharge Information Details Patient Name: Date of Service: Steve [name] 03/27/2023 2:30 PM Medical Record Number: [id number] Patient  Account Number: [id number] Date of Birth/Sex: Treating RN: 1981-02-25 (42 y.o. Steve [name] Primary Care Steve [name]: Steve [name] Other Clinician: Referring Steve [name]: Treating Steve [name]/Extender: Steve [name] in Treatment: 10 Encounter Discharge Information Items Discharge Condition: Stable Ambulatory Status: Ambulatory Discharge Destination: Home Transportation: Private Auto Accompanied By: self Schedule Follow-up Appointment: Yes Clinical Summary of Care: Electronic Signature(s) Signed: 03/31/2023 8:00:25 AM By: Steve Pax RN Entered By: Steve [name] on 03/27/2023 12:10:42 -------------------------------------------------------------------------------- Lower Extremity Assessment Details Patient Name: Date of Service: Steve [name] [name] 03/27/2023 2:30 PM Steve Levins [name] ([id number]) [phone].pdf Page 4 of 6 Medical Record Number: [id number] Patient Account Number: [id number] Date of Birth/Sex: Treating RN: May 26, 1981 (42 y.o. Steve [name]) Steve [name] Primary Care Heinrich Fertig: Steve [name] Other Clinician: Referring Purvi Ruehl: Treating Ennifer Harston/Extender: Steve [name] in Treatment: 10 Electronic Signature(s) Signed: 03/31/2023 8:00:25 AM By: Steve Pax RN Entered By: Steve [name] on 03/27/2023 11:40:24 -------------------------------------------------------------------------------- Multi Wound Chart Details Patient Name: Date of Service: Steve [name] 03/27/2023 2:30 PM Medical Record Number: [id number] Patient Account Number: [id number] Date of Birth/Sex: Treating RN: Jul 13, 1980 (42 y.o. Steve [name] Primary Care Lamara Brecht: Steve [name] Other Clinician: Referring Pollyann Roa: Treating Kamira Mellette/Extender: Steve [name] in Treatment: 10 Vital Signs Height(in): 74 Pulse(bpm): 93 Weight(lbs): 230 Blood Pressure(mmHg): 137/86 Body Mass Index(BMI):  29.5 Temperature(F): 97.8 Respiratory Rate(breaths/min): 18 [Treatment Notes:Wound Assessments Treatment Notes] Electronic Signature(s) Signed: 03/31/2023 8:00:25 AM By: Steve Pax RN Entered By: Steve [name] on 03/27/2023 12:05:35 -------------------------------------------------------------------------------- Multi-Disciplinary Care Plan Details Patient Name: Date of Service: Steve [name] 03/27/2023 2:30 PM Medical Record Number: [id number] Patient Account Number: [id number] Date of Birth/Sex: Treating RN: 09-24-1980 (42 y.o. Steve [name] Primary Care Nikolaj Geraghty: Steve [name] Other Clinician: Referring Reshunda Strider: Treating Mena Lienau/Extender: Steve [name] in Treatment: [address] Steve [name], Steve [name] ([id number])  [phone].pdf Page 5 of 6 Electronic Signature(s) Signed: 03/31/2023 8:00:25 AM By: Steve Pax RN Entered By: Steve [name] on 03/27/2023 12:09:54 -------------------------------------------------------------------------------- Pain Assessment Details Patient Name: Date of Service: Steve [name] [facility] [name] 03/27/2023 2:30 PM Medical Record Number: [id number] Patient Account Number: [id number] Date of Birth/Sex: Treating RN: [birth year] (42 y.o. Steve [name] Primary Care Chessie Neuharth: Steve [name] Other Clinician: Referring Eldred Sooy: Treating Kallin Henk/Extender: Steve [name] in Treatment: 10 Active Problems Location of Pain Severity and Description of Pain Patient Has Paino No Site Locations Pain Management and Medication Current Pain Management: Electronic Signature(s) Signed: 03/31/2023 8:00:25 AM By: Steve Pax RN Entered By: Steve [name] on 03/27/2023 11:38:39 Patient/Caregiver Education Details -------------------------------------------------------------------------------- Steve [name] ([id number]) [phone].pdf Page 6 of 6 Patient Name:  Date of Service: Steve [name] 10/24/2024andnbsp2:30 PM Medical Record Number: [id number] Patient Account Number: [id number] Date of Birth/Gender: Treating RN: [birth year] (42 y.o. Steve [name]) Steve [name] Primary Care Physician: Steve [name] Other Clinician: Referring Physician: Treating Physician/Extender: Steve [name] in Treatment: 10 Education Assessment Education Provided To: Patient Education Topics Provided Electronic Signature(s) Signed: 03/31/2023 8:00:25 AM By: Steve Pax RN Entered By: Steve [name] on 03/27/2023 12:10:07 -------------------------------------------------------------------------------- Vitals Details Patient Name: Date of Service: Steve [name] 03/27/2023 2:30 PM Medical Record Number: [id number] Patient Account Number: [id number] Date of Birth/Sex: Treating RN: July 15, 1980 (42 y.o. Steve [name]) Steve [name] Primary Care Mace Weinberg: Steve [name] Other Clinician: Referring Jillann Charette: Treating Terrica Duecker/Extender: Steve [name] in Treatment: 10 Vital Signs Time Taken: 14:36 Temperature (F): 97.8 Height (in): 74 Pulse (bpm): 93 Weight (lbs): 230 Respiratory Rate (breaths/min): 18 Body Mass Index (BMI): 29.5 Blood Pressure (mmHg): 137/86 Reference Range: 80 - 120 mg / dl Electronic Signature(s) Signed: 03/31/2023 8:00:25 AM By: Steve Pax RN Entered By: Steve [name] on 03/27/2023 11:38:26

## 2023-03-27 NOTE — Progress Notes (Addendum)
[name], [name] ([id number]) [id number] Page 1 of 6 Visit Report for 03/27/2023 Chief Complaint Document Details Patient Name: Date of Service: Steve [name] [facility] [name] 03/27/2023 2:30 PM Medical Record Number: [id number] Patient Account Number: [id number] Date of Birth/Sex: Treating RN: Oct 10, 1980 (42 y.o. [name]) Yevonne Pax Primary Care Provider: Larae Grooms Other Clinician: Referring Provider: Treating Provider/Extender: Layne Benton in Treatment: 10 Information Obtained from: Patient Chief Complaint Open surgical wound due to Fournier gangrene Electronic Signature(s) Signed: 03/27/2023 2:36:21 PM By: Allen Derry PA-C Entered By: Allen Derry on 03/27/2023 11:36:20 -------------------------------------------------------------------------------- HPI Details Patient Name: Date of Service: Steve [name] 03/27/2023 2:30 PM Medical Record Number: [id number] Patient Account Number: [id number] Date of Birth/Sex: Treating RN: 1981/03/12 (42 y.o. Steve [name] Primary Care Provider: Larae Grooms Other Clinician: Referring Provider: Treating Provider/Extender: Layne Benton in Treatment: 10 History of Present Illness HPI Description: 01-13-2023 upon evaluation today patient presents for a significant issue here regarding an issue where he had a Fournier gangrene which subsequently was manage inpatient. He ended up having to have obviously surgical intervention and unfortunately post intervention he does have now a significant wound which is in the perineal area which actually includes a portion of the scrotum as well that has been actually surgically removed and what appeared. Subsequently the testes are actually exposed to a degree and again this seems to be something that is more of a surgical things than just a wound. Thanks to me. I do not see a way that we can just get this to heal by second  intent. I think that he likely needs to see plastic surgery as soon as possible. I discussed that with the patient today and this is what the surgeon and the PA with the surgeon that I been discussing this case with as well has recommended but apparently plastic surgery wanted the patient seen by wound care first before they would proceed with any type of surgical intervention. The patient is currently on a Dexcom although he previously did have a significant elevation in his blood sugars back in July on the 28 2024 his A1c was 13.5. With that being said that is being worked on trying to get this under control. He does have a history again obviously of diabetes mellitus type 2, hypertension, and more recently the Fournier gangrene. The patient's wound actually started January 03, 2023 as a small abscess that then actually exponentially worsened. 01-21-2023 upon evaluation today patient appears to be doing actually better than what expected is regard to the wound. He actually does have an appointment with a plastic surgeon in coming days with: Plastic surgery. This is good news. Again the obvious goal is to try to see what can be done to get this close much more effectively and appropriately than just by second intent. The good news is he actually appears to have a significant mount of hypergranulation tissue over top of the testes as well as the area in general seems to want a healing. This is just taking a very long time unfortunately. I am also not certain that this is actually appropriately healing and the way that this is happening but again I am not sure what can be done differently hence the reason that we really want to see if there is a plastic surgery option towards getting this closed faster and more appropriately. Steve [name], Steve [name] ([id number]) [id number] Page 2 of 6 8/27; this is a patient who  had Fournier's gangrene involving the scrotum extending into the  surrounding soft tissue. Using wet-to-dry dressings that are being changed daily. We are making good progress. He is also followed by plastic surgery I think they are going to try to sew up part of the lower perineum but they are leaving the area over the testicle open to here heal via secondary intention 02-11-2023 patient has undergone plastic surgery with Dr. Ladona Ridgel. Subsequently this seems to be healing well. I could not see the base of the wound as he has Sorbact taped over top of what ever skin product was put on during the debridement and closure. That was on 01-31-2023. With that being said this actually comes off he tells me on Thursday and then will see where things go. I do not have any notes that can actually review right now in the system unfortunately from Dr. Ladona Ridgel and so I am unsure of exactly the plan. Nonetheless the patient tells me that Dr. Ladona Ridgel did want him to continue to come to wound care. 03-27-2023 upon evaluation today patient fortunately appears to be completely healed. He did well with the plastic surgery consult and procedure and has healed quite nicely. I am very pleased. Electronic Signature(s) Signed: 03/27/2023 5:56:59 PM By: Allen Derry PA-C Entered By: Allen Derry on 03/27/2023 14:56:59 -------------------------------------------------------------------------------- Physical Exam Details Patient Name: Date of Service: Steve [name] [facility] [name] 03/27/2023 2:30 PM Medical Record Number: [id number] Patient Account Number: [id number] Date of Birth/Sex: Treating RN: 12/10/1980 (42 y.o. Steve [name] Primary Care Provider: Larae Grooms Other Clinician: Referring Provider: Treating Provider/Extender: Layne Benton in Treatment: 10 Constitutional Well-nourished and well-hydrated in no acute distress. Respiratory normal breathing without difficulty. Psychiatric this patient is able to make decisions and demonstrates good insight into  disease process. Alert and Oriented x 3. pleasant and cooperative. Notes Upon inspection patient's wound bed actually showed signs of good granulation and epithelization at this point. Fortunately I do not see any signs of worsening overall and I do believe that the patient is making excellent headway towards closure. Electronic Signature(s) Signed: 03/27/2023 5:57:11 PM By: Allen Derry PA-C Entered By: Allen Derry on 03/27/2023 14:57:11 -------------------------------------------------------------------------------- Physician Orders Details Patient Name: Date of Service: Steve [name] 03/27/2023 2:30 PM Medical Record Number: [id number] Patient Account Number: [id number] Date of Birth/Sex: Treating RN: 06-06-1980 (42 y.o. Steve [name] Primary Care Provider: Larae Grooms Other Clinician: [name] ([id number]) [id number] Page 3 of 6 Referring Provider: Treating Provider/Extender: Layne Benton in Treatment: 10 The following information was scribed by: Yevonne Pax The information was scribed for: Allen Derry Verbal / Phone Orders: No Diagnosis Coding ICD-10 Coding Code Description N49.3 Fournier gangrene L98.498 Non-pressure chronic ulcer of skin of other sites with other specified severity E11.622 Type 2 diabetes mellitus with other skin ulcer I10 Essential (primary) hypertension Discharge From [facility] Services Discharge from Wound Care Center Treatment Complete Electronic Signature(s) Signed: 03/27/2023 4:03:43 PM By: Yevonne Pax RN Signed: 03/27/2023 6:30:03 PM By: Allen Derry PA-C Entered By: Yevonne Pax on 03/27/2023 13:03:42 -------------------------------------------------------------------------------- Problem List Details Patient Name: Date of Service: Steve [name] 03/27/2023 2:30 PM Medical Record Number: [id number] Patient Account Number: [id number] Date of Birth/Sex: Treating  RN: 1980/09/01 (42 y.o. Steve [name] Primary Care Provider: Larae Grooms Other Clinician: Referring Provider: Treating Provider/Extender: Layne Benton in Treatment: 10 Active Problems ICD-10 Encounter Code Description Active Date MDM Diagnosis N49.3  Fournier gangrene 01/13/2023 No Yes L98.498 Non-pressure chronic ulcer of skin of other sites with other specified severity 01/13/2023 No Yes E11.622 Type 2 diabetes mellitus with other skin ulcer 01/13/2023 No Yes I10 Essential (primary) hypertension 01/13/2023 No Yes Inactive Problems Resolved Problems Steve [name], Steve [name] ([id number]) [id number] Page 4 of 6 Electronic Signature(s) Signed: 03/27/2023 2:36:12 PM By: Allen Derry PA-C Entered By: Allen Derry on 03/27/2023 11:36:12 -------------------------------------------------------------------------------- Progress Note Details Patient Name: Date of Service: Steve [name] 03/27/2023 2:30 PM Medical Record Number: [id number] Patient Account Number: [id number] Date of Birth/Sex: Treating RN: 1980-10-11 (42 y.o. Steve [name] Primary Care Provider: Larae Grooms Other Clinician: Referring Provider: Treating Provider/Extender: Layne Benton in Treatment: 10 Subjective Chief Complaint Information obtained from Patient Open surgical wound due to Fournier gangrene History of Present Illness (HPI) 01-13-2023 upon evaluation today patient presents for a significant issue here regarding an issue where he had a Fournier gangrene which subsequently was manage inpatient. He ended up having to have obviously surgical intervention and unfortunately post intervention he does have now a significant wound which is in the perineal area which actually includes a portion of the scrotum as well that has been actually surgically removed and what appeared. Subsequently the testes are actually exposed to a degree and  again this seems to be something that is more of a surgical things than just a wound. Thanks to me. I do not see a way that we can just get this to heal by second intent. I think that he likely needs to see plastic surgery as soon as possible. I discussed that with the patient today and this is what the surgeon and the PA with the surgeon that I been discussing this case with as well has recommended but apparently plastic surgery wanted the patient seen by wound care first before they would proceed with any type of surgical intervention. The patient is currently on a Dexcom although he previously did have a significant elevation in his blood sugars back in July on the 28 2024 his A1c was 13.5. With that being said that is being worked on trying to get this under control. He does have a history again obviously of diabetes mellitus type 2, hypertension, and more recently the Fournier gangrene. The patient's wound actually started January 03, 2023 as a small abscess that then actually exponentially worsened. 01-21-2023 upon evaluation today patient appears to be doing actually better than what expected is regard to the wound. He actually does have an appointment with a plastic surgeon in coming days with: Plastic surgery. This is good news. Again the obvious goal is to try to see what can be done to get this close much more effectively and appropriately than just by second intent. The good news is he actually appears to have a significant mount of hypergranulation tissue over top of the testes as well as the area in general seems to want a healing. This is just taking a very long time unfortunately. I am also not certain that this is actually appropriately healing and the way that this is happening but again I am not sure what can be done differently hence the reason that we really want to see if there is a plastic surgery option towards getting this closed faster and more appropriately. 8/27; this is a  patient who had Fournier's gangrene involving the scrotum extending into the surrounding soft tissue. Using wet-to-dry dressings that are being changed daily. We are making  good progress. He is also followed by plastic surgery I think they are going to try to sew up part of the lower perineum but they are leaving the area over the testicle open to here heal via secondary intention 02-11-2023 patient has undergone plastic surgery with Dr. Ladona Ridgel. Subsequently this seems to be healing well. I could not see the base of the wound as he has Sorbact taped over top of what ever skin product was put on during the debridement and closure. That was on 01-31-2023. With that being said this actually comes off he tells me on Thursday and then will see where things go. I do not have any notes that can actually review right now in the system unfortunately from Dr. Ladona Ridgel and so I am unsure of exactly the plan. Nonetheless the patient tells me that Dr. Ladona Ridgel did want him to continue to come to wound care. 03-27-2023 upon evaluation today patient fortunately appears to be completely healed. He did well with the plastic surgery consult and procedure and has healed quite nicely. I am very pleased. Objective Constitutional Well-nourished and well-hydrated in no acute distress. Vitals Time Taken: 2:36 PM, Height: 74 in, Weight: 230 lbs, BMI: 29.5, Temperature: 97.8 F, Pulse: 93 bpm, Respiratory Rate: 18 breaths/min, Blood Pressure: 137/86 mmHg. Respiratory normal breathing without difficulty. Steve [name], Steve [name] ([id number]) [id number] Page 5 of 6 Psychiatric this patient is able to make decisions and demonstrates good insight into disease process. Alert and Oriented x 3. pleasant and cooperative. General Notes: Upon inspection patient's wound bed actually showed signs of good granulation and epithelization at this point. Fortunately I do not see any signs of worsening overall and I do  believe that the patient is making excellent headway towards closure. 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 I would recommend that we have the patient continue to monitor for any signs of worsening obviously right now he does appear to be completely closed and we can discontinue wound care services at this point. Will see him back for a follow-up visit only as needed he will use topical AandD ointment to try to help with remodeling. Electronic Signature(s) Signed: 03/27/2023 5:57:32 PM By: Allen Derry PA-C Entered By: Allen Derry on 03/27/2023 14:57:32 -------------------------------------------------------------------------------- SuperBill Details Patient Name: Date of Service: Steve [name] 03/27/2023 Medical Record Number: [id number] Patient Account Number: [id number] Date of Birth/Sex: Treating RN: 07-07-1980 (42 y.o. [name]) Yevonne Pax Primary Care Provider: Larae Grooms Other Clinician: Referring Provider: Treating Provider/Extender: Layne Benton in Treatment: 10 Diagnosis Coding ICD-10 Codes Code Description N49.3 Fournier gangrene [phone] Non-pressure chronic ulcer of skin of other sites with other specified severity E11.622 Type 2 diabetes mellitus with other skin ulcer I10 Essential (primary) hypertension Facility Procedures : Steve [name], Steve [name] CodeJarrion Burgeson [name] ([id number]) [id number] Description: [phone] 12 - WOUND CARE VISIT-LEV 2 EST PT Modifier: [id number] 1 Quantity: pdf Page 6 of 6 Physician Procedures : CPT4 Code Description Modifier [id number] - WC PHYS LEVEL 3 - EST PT ICD-10 Diagnosis Description N49.3 Fournier gangrene L98.498 Non-pressure  chronic ulcer of skin of other sites with other specified severity E11.622 Type 2 diabetes mellitus with  other skin ulcer I10 Essential (primary) hypertension Quantity: 1 Electronic Signature(s) Signed: 03/27/2023 5:57:42 PM By: Allen Derry PA-C Entered  By: Allen Derry on 03/27/2023 14:57:42

## 2023-04-02 NOTE — Progress Notes (Deleted)
   There were no vitals taken for this visit.   Subjective:    Patient ID: Steve [name], adult    DOB: 11/19/1980, 42 y.o.   MRN: [id number]  HPI: Steve [name] is a 42 y.o. adult  No chief complaint on file.  HYPERTENSION / HYPERLIPIDEMIA Satisfied with current treatment? {Blank single:19197::"yes","no"} Duration of hypertension: {Blank single:19197::"chronic","months","years"} BP monitoring frequency: {Blank single:19197::"not checking","rarely","daily","weekly","monthly","a few times a day","a few times a week","a few times a month"} BP range:  BP medication side effects: {Blank single:19197::"yes","no"} Past BP meds: {Blank multiple:19196::"none","amlodipine","amlodipine/benazepril","atenolol","benazepril","benazepril/HCTZ","bisoprolol (bystolic)","carvedilol","chlorthalidone","clonidine","diltiazem","exforge HCT","HCTZ","irbesartan (avapro)","labetalol","lisinopril","lisinopril-HCTZ","losartan (cozaar)","methyldopa","nifedipine","olmesartan (benicar)","olmesartan-HCTZ","quinapril","ramipril","spironalactone","tekturna","valsartan","valsartan-HCTZ","verapamil"} Duration of hyperlipidemia: {Blank single:19197::"chronic","months","years"} Cholesterol medication side effects: {Blank single:19197::"yes","no"} Cholesterol supplements: {Blank multiple:19196::"none","fish oil","niacin","red yeast rice"} Past cholesterol medications: {Blank multiple:19196::"none","atorvastain (lipitor)","lovastatin (mevacor)","pravastatin (pravachol)","rosuvastatin (crestor)","simvastatin (zocor)","vytorin","fenofibrate (tricor)","gemfibrozil","ezetimide (zetia)","niaspan","lovaza"} Medication compliance: {Blank single:19197::"excellent compliance","good compliance","fair compliance","poor compliance"} Aspirin: {Blank single:19197::"yes","no"} Recent stressors: {Blank single:19197::"yes","no"} Recurrent headaches: {Blank single:19197::"yes","no"} Visual changes: {Blank  single:19197::"yes","no"} Palpitations: {Blank single:19197::"yes","no"} Dyspnea: {Blank single:19197::"yes","no"} Chest pain: {Blank single:19197::"yes","no"} Lower extremity edema: {Blank single:19197::"yes","no"} Dizzy/lightheaded: {Blank single:19197::"yes","no"}  DIABETES Hypoglycemic episodes:{Blank single:19197::"yes","no"} Polydipsia/polyuria: {Blank single:19197::"yes","no"} Visual disturbance: {Blank single:19197::"yes","no"} Chest pain: {Blank single:19197::"yes","no"} Paresthesias: {Blank single:19197::"yes","no"} Glucose Monitoring: {Blank single:19197::"yes","no"}  Accucheck frequency: {Blank single:19197::"Not Checking","Daily","BID","TID"}  Fasting glucose:  Post prandial:  Evening:  Before meals: Taking Insulin?: {Blank single:19197::"yes","no"}  Long acting insulin:  Short acting insulin: Blood Pressure Monitoring: {Blank single:19197::"not checking","rarely","daily","weekly","monthly","a few times a day","a few times a week","a few times a month"} Retinal Examination: {Blank single:19197::"Up to Date","Not up to Date"} Foot Exam: {Blank single:19197::"Up to Date","Not up to Date"} Diabetic Education: {Blank single:19197::"Completed","Not Completed"} Pneumovax: {Blank single:19197::"Up to Date","Not up to Date","unknown"} Influenza: {Blank single:19197::"Up to Date","Not up to Date","unknown"} Aspirin: {Blank single:19197::"yes","no"}  Relevant past medical, surgical, family and social history reviewed and updated as indicated. Interim medical history since our last visit reviewed. Allergies and medications reviewed and updated.  Review of Systems  Per HPI unless specifically indicated above     Objective:    There were no vitals taken for this visit.  Wt Readings from Last 3 Encounters:  02/27/23 222 lb (100.7 kg)  02/13/23 219 lb 9.6 oz (99.6 kg)  01/31/23 220 lb 0.3 oz (99.8 kg)    Physical Exam  Results for orders placed or performed in visit on  02/26/23  Lab report - scanned  Result Value Ref Range   EGFR 120.0       Assessment & Plan:   Problem List Items Addressed This Visit   None    Follow up plan: No follow-ups on file.

## 2023-04-03 ENCOUNTER — Ambulatory Visit: Payer: 59 | Admitting: Nurse Practitioner

## 2023-04-03 DIAGNOSIS — Z720 Tobacco use: Secondary | ICD-10-CM

## 2023-04-03 DIAGNOSIS — E1165 Type 2 diabetes mellitus with hyperglycemia: Secondary | ICD-10-CM

## 2023-04-03 DIAGNOSIS — E1159 Type 2 diabetes mellitus with other circulatory complications: Secondary | ICD-10-CM

## 2023-04-03 DIAGNOSIS — I700 Atherosclerosis of aorta: Secondary | ICD-10-CM

## 2023-04-03 DIAGNOSIS — E785 Hyperlipidemia, unspecified: Secondary | ICD-10-CM

## 2023-04-03 DIAGNOSIS — K219 Gastro-esophageal reflux disease without esophagitis: Secondary | ICD-10-CM

## 2023-12-09 ENCOUNTER — Ambulatory Visit: Payer: Self-pay | Admitting: Emergency Medicine

## 2023-12-09 VITALS — Ht 74.0 in | Wt 240.0 lb

## 2023-12-09 DIAGNOSIS — Z0001 Encounter for general adult medical examination with abnormal findings: Secondary | ICD-10-CM

## 2023-12-09 DIAGNOSIS — Z5941 Food insecurity: Secondary | ICD-10-CM | POA: Diagnosis not present

## 2023-12-09 DIAGNOSIS — Z0000 Encounter for general adult medical examination without abnormal findings: Secondary | ICD-10-CM

## 2023-12-09 DIAGNOSIS — E1165 Type 2 diabetes mellitus with hyperglycemia: Secondary | ICD-10-CM

## 2023-12-09 DIAGNOSIS — E119 Type 2 diabetes mellitus without complications: Secondary | ICD-10-CM | POA: Diagnosis not present

## 2023-12-09 NOTE — Progress Notes (Signed)
 Subjective:   Steve [name] is a 43 y.o. who presents for a Medicare Wellness preventive visit.  As a reminder, Annual Wellness Visits don't include a physical exam, and some assessments may be limited, especially if this visit is performed virtually. We may recommend an in-person follow-up visit with your provider if needed.  Visit Complete: Virtual I connected with  Juris ONEIDA Dollar on 12/09/23 by a audio enabled telemedicine application and verified that I am speaking with the correct person using two identifiers.  Patient Location: Home  Provider Location: Office/Clinic  I discussed the limitations of evaluation and management by telemedicine. The patient expressed understanding and agreed to proceed.  Vital Signs: Because this visit was a virtual/telehealth visit, some criteria may be missing or patient reported. Any vitals not documented were not able to be obtained and vitals that have been documented are patient reported.  VideoDeclined- This patient declined Librarian, academic. Therefore the visit was completed with audio only.  Persons Participating in Visit: Patient.  AWV Questionnaire: No: Patient Medicare AWV questionnaire was not completed prior to this visit.  Cardiac Risk Factors include: male gender;diabetes mellitus;dyslipidemia;hypertension;obesity (BMI >30kg/m2)     Objective:    Today's Vitals   12/09/23 1307  Weight: 240 lb (108.9 kg)  Height: 6' 2 (1.88 m)   Body mass index is 30.81 kg/m.     12/09/2023    1:27 PM 02/12/2023    2:27 PM 01/24/2023   11:52 AM 12/28/2022    8:39 PM 12/25/2022    8:43 AM 12/02/2022    9:33 AM 11/29/2021   11:16 AM  Advanced Directives  Does Patient Have a Medical Advance Directive? Yes Yes Yes No No No No  Type of Estate agent of [place];Living will Healthcare Power of [employer] Power of Attorney      Does patient want to make changes to medical advance  directive? No - Patient declined  No - Patient declined      Copy of Healthcare Power of Attorney in Chart? No - copy requested  No - copy requested      Would patient like information on creating a medical advance directive?    No - Patient declined  No - Patient declined No - Patient declined    Current Medications (verified) Outpatient Encounter Medications as of 12/09/2023  Medication Sig   acetaminophen  (TYLENOL ) 325 MG tablet Take 2 tablets (650 mg total) by mouth every 6 (six) hours as needed for mild pain (or Fever >/= 101).   amLODipine  (NORVASC ) 5 MG tablet Take 1 tablet (5 mg total) by mouth daily. (Patient taking differently: Take 5 mg by mouth daily. Taking once or twice a week)   Clotrimazole  1 % OINT Apply 0.5 inches topically in the morning and at bedtime. Apply to groin area   dexlansoprazole  (DEXILANT ) 60 MG capsule TAKE 1 CAPSULE BY MOUTH EVERY DAY (Patient taking differently: Take 60 mg by mouth daily. prn)   docusate sodium  (COLACE) 100 MG capsule Take 1 tablet once or twice daily as needed for constipation while taking narcotic pain medicine   ondansetron  (ZOFRAN -ODT) 4 MG disintegrating tablet Take 1 tablet (4 mg total) by mouth every 8 (eight) hours as needed for nausea or vomiting.   tirzepatide  (MOUNJARO ) 5 MG/0.5ML Pen Inject 5 mg into the skin once a week.   Blood Glucose Monitoring Suppl DEVI 1 each by Does not apply route in the morning, at noon, and at bedtime. May  substitute to any manufacturer covered by patient's insurance. (Patient not taking: Reported on 12/09/2023)   Continuous Glucose Receiver (FREESTYLE LIBRE 14 DAY READER) DEVI 1 Device by Does not apply route as directed. (Patient not taking: Reported on 12/09/2023)   Continuous Glucose Sensor (FREESTYLE LIBRE 14 DAY SENSOR) MISC 1 Device by Does not apply route every 14 (fourteen) days. (Patient not taking: Reported on 12/09/2023)   Glucose Blood (BLOOD GLUCOSE TEST STRIPS) STRP 1 each by In Vitro route in the  morning, at noon, and at bedtime. May substitute to any manufacturer covered by patient's insurance. (Patient not taking: Reported on 12/09/2023)   metroNIDAZOLE  (FLAGYL ) 500 MG tablet Take 1 tablet (500 mg total) by mouth 2 (two) times daily. (Patient not taking: Reported on 12/09/2023)   oxyCODONE -acetaminophen  (PERCOCET) 5-325 MG tablet Take 2 tablets by mouth every 8 (eight) hours as needed for severe pain. (Patient not taking: Reported on 12/09/2023)   polyethylene glycol (MIRALAX  / GLYCOLAX ) 17 g packet Take 17 g by mouth daily. (Patient not taking: Reported on 12/09/2023)   rosuvastatin  (CRESTOR ) 5 MG tablet Take 1 tablet (5 mg total) by mouth daily. (Patient not taking: Reported on 12/09/2023)   traZODone  (DESYREL ) 50 MG tablet Take 0.5 tablets (25 mg total) by mouth at bedtime as needed for sleep. (Patient not taking: Reported on 12/09/2023)   No facility-administered encounter medications on file as of 12/09/2023.    Allergies (verified) Patient has no known allergies.   History: Past Medical History:  Diagnosis Date   Diabetes mellitus without complication (HCC)    GERD (gastroesophageal reflux disease)    History of gastritis 09/2016   Hypertension    Past Surgical History:  Procedure Laterality Date   COLONOSCOPY WITH PROPOFOL  N/A 11/15/2019   Procedure: COLONOSCOPY WITH PROPOFOL  WITH POLYPECTOMY, BIOPSIES;  Surgeon: Jinny Carmine, MD;  Location: [facility];  Service: Endoscopy;  Laterality: N/A;   DEBRIDEMENT AND CLOSURE WOUND N/A 01/31/2023   Procedure: DEBRIDEMENT AND CLOSURE WOUND;  Surgeon: Waddell Leonce NOVAK, MD;  Location: [facility];  Service: Plastics;  Laterality: N/A;   ESOPHAGOGASTRODUODENOSCOPY (EGD) WITH PROPOFOL  N/A 11/15/2019   Procedure: ESOPHAGOGASTRODUODENOSCOPY (EGD) WITH PROPOFOL  WITH POLYPECTOMY, BIOPSIES;  Surgeon: Jinny Carmine, MD;  Location: [facility];  Service: Endoscopy;  Laterality: N/A;   INCISION AND DRAINAGE ABSCESS N/A 12/29/2022    Procedure: INCISION AND DEBRIDEMENT;  Surgeon: Carolee Sherwood JONETTA DOUGLAS, MD;  Location: [facility];  Service: Urology;  Laterality: N/A;   TOOTH EXTRACTION     Family History  Problem Relation Age of Onset   Healthy Mother    Liver cancer Father    Diabetes Brother    Hypertension Brother    Diabetes Maternal Aunt    Hypertension Maternal Aunt    Cancer Maternal Aunt        pancreas   Diabetes Maternal Uncle    Hypertension Maternal Uncle    Cancer Maternal Uncle        bone   Cancer Maternal Uncle        lung   Diabetes Maternal Grandmother    Cancer Maternal Grandmother    Diabetes Maternal Grandfather    COPD Neg Hx    Heart disease Neg Hx    Stroke Neg Hx    Social History   Socioeconomic History   Marital status: Single    Spouse name: Not on file   Number of children: 3   Years of education: Not on file   Highest  education level: Not on file  Occupational History   Occupation: disability  Tobacco Use   Smoking status: Former    Current packs/day: 0.00    Average packs/day: 0.5 packs/day for 25.5 years (12.7 ttl pk-yrs)    Types: Cigarettes    Start date: 41    Quit date: 12/2022    Years since quitting: 1.0   Smokeless tobacco: Never   Tobacco comments:    since age 33; pt reports he quit 2 months ago (BMP, 01/27/23)  Vaping Use   Vaping status: Some Days   Substances: Flavoring  Substance and Sexual Activity   Alcohol use: No   Drug use: Yes    Frequency: 7.0 times per week    Types: Marijuana    Comment: smokes daily   Sexual activity: Yes  Other Topics Concern   Not on file  Social History Narrative   Not on file   Social Drivers of Health   Financial Resource Strain: Low Risk  (12/09/2023)   Overall Financial Resource Strain (CARDIA)    Difficulty of Paying Living Expenses: Not very hard  Food Insecurity: Food Insecurity Present (12/09/2023)   Hunger Vital Sign    Worried About Running Out of Food in the Last Year: Sometimes true    Ran Out of  Food in the Last Year: Sometimes true  Transportation Needs: No Transportation Needs (12/09/2023)   PRAPARE - Administrator, Civil Service (Medical): No    Lack of Transportation (Non-Medical): No  Physical Activity: Insufficiently Active (12/09/2023)   Exercise Vital Sign    Days of Exercise per Week: 3 days    Minutes of Exercise per Session: 40 min  Stress: No Stress Concern Present (12/09/2023)   [employer] of Occupational Health - Occupational Stress Questionnaire    Feeling of Stress: Not at all  Social Connections: Socially Isolated (12/09/2023)   Social Connection and Isolation Panel    Frequency of Communication with Friends and Family: More than three times a week    Frequency of Social Gatherings with Friends and Family: Three times a week    Attends Religious Services: Never    Active Member of Clubs or Organizations: No    Attends Banker Meetings: Never    Marital Status: Never married    Tobacco Counseling Counseling given: Not Answered Tobacco comments: since age 40; pt reports he quit 2 months ago (BMP, 01/27/23)    Clinical Intake:  Pre-visit preparation completed: Yes  Pain : No/denies pain     BMI - recorded: 30.81 Nutritional Status: BMI > 30  Obese Nutritional Risks: Nausea/ vomitting/ diarrhea (nausea) Diabetes: Yes CBG done?: No Did pt. bring in CBG monitor from home?: No  Lab Results  Component Value Date   HGBA1C 13.5 (H) 12/29/2022   HGBA1C 12.7 (H) 11/18/2022   HGBA1C 11.5 (H) 05/20/2022     How often do you need to have someone help you when you read instructions, pamphlets, or other written materials from your doctor or pharmacy?: 3 - Sometimes (due to vision)  Interpreter Needed?: No  Information entered by :: Vina Ned, CMA   Activities of Daily Living     12/09/2023    1:12 PM 01/31/2023    7:36 AM  In your present state of health, do you have any difficulty performing the following activities:   Hearing? 0 0  Vision? 1 0  Comment at times   Difficulty concentrating or making decisions? 0 0  Walking  or climbing stairs? 0 0  Dressing or bathing? 0 0  Doing errands, shopping? 0   Preparing Food and eating ? N   Using the Toilet? N   In the past six months, have you accidently leaked urine? N   Do you have problems with loss of bowel control? N   Managing your Medications? N   Managing your Finances? N   Housekeeping or managing your Housekeeping? N     Patient Care Team: Melvin Pao, NP as PCP - General (Nurse Practitioner) Roni, [facility] (Optometry)  I have updated your Care Teams any recent Medical Services you may have received from other providers in the past year.     Assessment:   This is a routine wellness examination for Eilam.  Hearing/Vision screen Hearing Screening - Comments:: Denies hearing loss  Vision Screening - Comments:: Needs DM eye exam, [place], [place]. Patient to call and schedule.   Goals Addressed             This Visit's Progress    Patient Stated       Lose 15 lbs       Depression Screen     12/09/2023    1:22 PM 01/16/2023   10:45 AM 12/02/2022    9:31 AM 11/18/2022    9:36 AM 03/19/2022   10:26 AM 02/14/2022    4:33 PM 11/29/2021   11:23 AM  PHQ 2/9 Scores  PHQ - 2 Score 0 0 2 1 0 1 1  PHQ- 9 Score 0  3 6 0 5 1    Fall Risk     12/09/2023    1:28 PM 01/16/2023   10:45 AM 12/03/2022   10:15 AM 12/03/2022    9:16 AM 12/02/2022    9:34 AM  Fall Risk   Falls in the past year? 0 0 0 0 0  Number falls in past yr: 0    0  Injury with Fall? 0    0  Risk for fall due to : No Fall Risks No Fall Risks   No Fall Risks  Follow up Falls evaluation completed Falls evaluation completed   Falls prevention discussed;Falls evaluation completed    MEDICARE RISK AT HOME:  Medicare Risk at Home Any stairs in or around the home?: No If so, are there any without handrails?: No Home free of loose throw rugs in  walkways, pet beds, electrical cords, etc?: Yes Adequate lighting in your home to reduce risk of falls?: Yes Life alert?: No Use of a cane, walker or w/c?: No Grab bars in the bathroom?: No Shower chair or bench in shower?: No Elevated toilet seat or a handicapped toilet?: No  TIMED UP AND GO:  Was the test performed?  No  Cognitive Function: 6CIT completed        12/09/2023    1:29 PM 11/29/2021   11:20 AM 11/27/2020   10:36 AM  6CIT Screen  What Year? 0 points 0 points 0 points  What month? 0 points 0 points 0 points  What time? 0 points 0 points 0 points  Count back from 20 0 points 0 points 0 points  Months in reverse 0 points 0 points 0 points  Repeat phrase 0 points 0 points 10 points  Total Score 0 points 0 points 10 points    Immunizations  There is no immunization history on file for this patient.  Screening Tests Health Maintenance  Topic Date Due  DTaP/Tdap/Td (1 - Tdap) Never done   Pneumococcal Vaccine 88-48 Years old (1 of 2 - PCV) Never done   Hepatitis B Vaccines (1 of 3 - 19+ 3-dose series) Never done   HPV VACCINES (1 - 3-dose SCDM series) Never done   OPHTHALMOLOGY EXAM  11/29/2022   COVID-19 Vaccine (1 - 2024-25 season) Never done   FOOT EXAM  02/15/2023   HEMOGLOBIN A1C  07/01/2023   Diabetic kidney evaluation - Urine ACR  11/18/2023   INFLUENZA VACCINE  01/02/2024   Diabetic kidney evaluation - eGFR measurement  01/20/2024   Colonoscopy  11/14/2024   Medicare Annual Wellness (AWV)  12/08/2024   Hepatitis C Screening  Completed   HIV Screening  Completed   Meningococcal B Vaccine  Aged Out    Health Maintenance  Health Maintenance Due  Topic Date Due   DTaP/Tdap/Td (1 - Tdap) Never done   Pneumococcal Vaccine 28-48 Years old (1 of 2 - PCV) Never done   Hepatitis B Vaccines (1 of 3 - 19+ 3-dose series) Never done   HPV VACCINES (1 - 3-dose SCDM series) Never done   OPHTHALMOLOGY EXAM  11/29/2022   COVID-19 Vaccine (1 - 2024-25 season)  Never done   FOOT EXAM  02/15/2023   HEMOGLOBIN A1C  07/01/2023   Diabetic kidney evaluation - Urine ACR  11/18/2023   Health Maintenance Items Addressed: Diabetic Foot Exam recommended, See Nurse Notes at the end of this note  Additional Screening:  Vision Screening: Recommended annual ophthalmology exams for early detection of glaucoma and other disorders of the eye. Would you like a referral to an eye doctor? No    Dental Screening: Recommended annual dental exams for proper oral hygiene  Community Resource Referral / Chronic Care Management: CRR required this visit?  Yes   CCM required this visit?  No   Plan:    I have personally reviewed and noted the following in the patient's chart:   Medical and social history Use of alcohol, tobacco or illicit drugs  Current medications and supplements including opioid prescriptions. Patient is not currently taking opioid prescriptions. Functional ability and status Nutritional status Physical activity Advanced directives List of other physicians Hospitalizations, surgeries, and ER visits in previous 12 months Vitals Screenings to include cognitive, depression, and falls Referrals and appointments  In addition, I have reviewed and discussed with patient certain preventive protocols, quality metrics, and best practice recommendations. A written personalized care plan for preventive services as well as general preventive health recommendations were provided to patient.   Vina Ned, CMA   12/09/2023   After Visit Summary: (MyChart) Due to this being a telephonic visit, the after visit summary with patients personalized plan was offered to patient via MyChart   Notes:  Placed referral for DM & Nutrition education Placed VBCI referral for foot insecurity Needs DM eye exam. Patient to call [facility] to schedule Needs DM foot exam at next OV 12/23/23. Scheduled patient an OV for 12/23/23 (last seen 02/27/23) Declined all  immunizations

## 2023-12-09 NOTE — Patient Instructions (Addendum)
 [name] , Thank you for taking time out of your busy schedule to complete your Annual Wellness Visit with me. I enjoyed our conversation and look forward to speaking with you again next year. I, as well as your care team,  appreciate your ongoing commitment to your health goals. Please review the following plan we discussed and let me know if I can assist you in the future. Your Game plan/ To Do List    Referrals: If you haven't heard from the office you've been referred to, please reach out to them at the phone provided.   A referral has been placed for you to check and see what additional resources are available to you.   If you haven't heard from anyone within the next 7 business days, please call them and let them know a referral has been placed for you Phone: [phone]  A referral has been placed for Diabetic & Nutrition Education. Phone: [phone].  Follow up Visits: Next Medicare AWV with our clinical staff: 12/14/24 @ 10:40am (IN PERSON VISIT)   Have you seen your provider in the last 6 months (3 months if uncontrolled diabetes)? No Next Office Visit with your provider: 12/23/23 @ 9:00am with Darice Petty, NP  Clinician Recommendations: Call to schedule a diabetic eye exam at [facility] Phone: [phone]. You should have a diabetic eye exam every year.  Aim for 30 minutes of exercise or brisk walking, 6-8 glasses of water , and 5 servings of fruits and vegetables each day.       This is a list of the screening recommended for you and due dates:  Health Maintenance  Topic Date Due   DTaP/Tdap/Td vaccine (1 - Tdap) Never done   Pneumococcal Vaccination (1 of 2 - PCV) Never done   Hepatitis B Vaccine (1 of 3 - 19+ 3-dose series) Never done   HPV Vaccine (1 - 3-dose SCDM series) Never done   Eye exam for diabetics  11/29/2022   COVID-19 Vaccine (1 - 2024-25 season) Never done   Complete foot exam   02/15/2023   Hemoglobin A1C  07/01/2023   Yearly kidney health  urinalysis for diabetes  11/18/2023   Flu Shot  01/02/2024   Yearly kidney function blood test for diabetes  01/20/2024   Colon Cancer Screening  11/14/2024   Medicare Annual Wellness Visit  12/08/2024   Hepatitis C Screening  Completed   HIV Screening  Completed   Meningitis B Vaccine  Aged Out    Advanced directives: (Copy Requested) Please bring a copy of your health care power of attorney and living will to the office to be added to your chart at your convenience. You can mail to [facility] [address] [place], [place] [address] or email to [email or web address] Advance Care Planning is important because it:  [x]  Makes sure you receive the medical care that is consistent with your values, goals, and preferences  [x]  It provides guidance to your family and loved ones and reduces their decisional burden about whether or not they are making the right decisions based on your wishes.  Follow the link provided in your after visit summary or read over the paperwork we have mailed to you to help you started getting your Advance Directives in place. If you need assistance in completing these, please reach out to us  so that we can help you!  See attachments for Preventive Care and Fall Prevention Tips.   Fall Prevention in the Home,  Adult Falls can cause injuries and affect people of all ages. There are many simple things that you can do to make your home safe and to help prevent falls. If you need it, ask for help making these changes. What actions can I take to prevent falls? General information Use good lighting in all rooms. Make sure to: Replace any light bulbs that burn out. Turn on lights if it is dark and use night-lights. Keep items that you use often in easy-to-reach places. Lower the shelves around your home if needed. Move furniture so that there are clear paths around it. Do not keep throw rugs or other things on the floor that can make you trip. If  any of your floors are uneven, fix them. Add color or contrast paint or tape to clearly mark and help you see: Grab bars or handrails. First and last steps of staircases. Where the edge of each step is. If you use a ladder or stepladder: Make sure that it is fully opened. Do not climb a closed ladder. Make sure the sides of the ladder are locked in place. Have someone hold the ladder while you use it. Know where your pets are as you move through your home. What can I do in the bathroom?     Keep the floor dry. Clean up any water  that is on the floor right away. Remove soap buildup in the bathtub or shower. Buildup makes bathtubs and showers slippery. Use non-skid mats or decals on the floor of the bathtub or shower. Attach bath mats securely with double-sided, non-slip rug tape. If you need to sit down while you are in the shower, use a non-slip stool. Install grab bars by the toilet and in the bathtub and shower. Do not use towel bars as grab bars. What can I do in the bedroom? Make sure that you have a light by your bed that is easy to reach. Do not use any sheets or blankets on your bed that hang to the floor. Have a firm bench or chair with side arms that you can use for support when you get dressed. What can I do in the kitchen? Clean up any spills right away. If you need to reach something above you, use a sturdy step stool that has a grab bar. Keep electrical cables out of the way. Do not use floor polish or wax that makes floors slippery. What can I do with my stairs? Do not leave anything on the stairs. Make sure that you have a light switch at the top and the bottom of the stairs. Have them installed if you do not have them. Make sure that there are handrails on both sides of the stairs. Fix handrails that are broken or loose. Make sure that handrails are as long as the staircases. Install non-slip stair treads on all stairs in your home if they do not have carpet. Avoid  having throw rugs at the top or bottom of stairs, or secure the rugs with carpet tape to prevent them from moving. Choose a carpet design that does not hide the edge of steps on the stairs. Make sure that carpet is firmly attached to the stairs. Fix any carpet that is loose or worn. What can I do on the outside of my home? Use bright outdoor lighting. Repair the edges of walkways and driveways and fix any cracks. Clear paths of anything that can make you trip, such as tools or rocks. Add color or contrast paint  or tape to clearly mark and help you see high doorway thresholds. Trim any bushes or trees on the main path into your home. Check that handrails are securely fastened and in good repair. Both sides of all steps should have handrails. Install guardrails along the edges of any raised decks or porches. Have leaves, snow, and ice cleared regularly. Use sand, salt, or ice melt on walkways during winter months if you live where there is ice and snow. In the garage, clean up any spills right away, including grease or oil spills. What other actions can I take? Review your medicines with your health care provider. Some medicines can make you confused or feel dizzy. This can increase your chance of falling. Wear closed-toe shoes that fit well and support your feet. Wear shoes that have rubber soles and low heels. Use a cane, walker, scooter, or crutches that help you move around if needed. Talk with your provider about other ways that you can decrease your risk of falls. This may include seeing a physical therapist to learn to do exercises to improve movement and strength. Where to find more information Centers for Disease Control and Prevention, STEADI: TonerPromos.no [employer] on Aging: BaseRingTones.pl National Institute on Aging: BaseRingTones.pl Contact a health care provider if: You are afraid of falling at home. You feel weak, drowsy, or dizzy at home. You fall at home. Get help right away if  you: Lose consciousness or have trouble moving after a fall. Have a fall that causes a head injury. These symptoms may be an emergency. Get help right away. Call 911. Do not wait to see if the symptoms will go away. Do not drive yourself to the hospital. This information is not intended to replace advice given to you by your health care provider. Make sure you discuss any questions you have with your health care provider. Document Revised: 01/21/2022 Document Reviewed: 01/21/2022 Elsevier Patient Education  2024 [employer].

## 2023-12-10 ENCOUNTER — Telehealth: Payer: Self-pay

## 2023-12-10 NOTE — Progress Notes (Signed)
 Complex Care Management Note Care Guide Note  12/10/2023 Name: Steve [name] MRN: [id number] DOB: [birth year]  Juris [name] is a 43 y.o. year old male who is a primary care patient of Melvin Pao, NP . The community resource team was consulted for assistance with Food Insecurity  SDOH screenings and interventions completed:  Yes  Social Drivers of Health From This Encounter   Food Insecurity: Food Insecurity Present (12/10/2023)   Hunger Vital Sign    Worried About Running Out of Food in the Last Year: Sometimes true    Ran Out of Food in the Last Year: Sometimes true  Housing: Low Risk  (12/10/2023)   Housing Stability Vital Sign    Unable to Pay for Housing in the Last Year: No    Number of Times Moved in the Last Year: 0    Homeless in the Last Year: No  Financial Resource Strain: Low Risk  (12/10/2023)   Overall Financial Resource Strain (CARDIA)    Difficulty of Paying Living Expenses: Not very hard  Transportation Needs: No Transportation Needs (12/10/2023)   PRAPARE - Administrator, Civil Service (Medical): No    Lack of Transportation (Non-Medical): No  Utilities: Not At Risk (12/10/2023)   Utilities    Threatened with loss of utilities: No    SDOH Interventions Today    Flowsheet Row Most Recent Value  SDOH Interventions   Food Insecurity Interventions Other (Comment)  [Emailed food pantry,[employer] SNAP application assistanc and [employer] Complete Extra Benefit information. Also sent information from FindHelp.]     Care guide performed the following interventions: Patient provided with information about care guide support team and interviewed to confirm resource needs.  Follow Up Plan:  No further follow up planned at this time. The patient has been provided with needed resources.  Encounter Outcome:  Patient Visit Completed  [name] [facility]  [facility] Guide Direct Dial: [phone]   Fax: [phone] Website: [email or web address]

## 2023-12-11 ENCOUNTER — Telehealth: Payer: Self-pay

## 2023-12-11 NOTE — Progress Notes (Signed)
 Complex Care Management Note  Care Guide Note 12/11/2023 Name: Steve [name] MRN: [id number] DOB: Oct 02, 1980  Steve [name] is a 43 y.o. year old male who sees Melvin Pao, NP for primary care. I reached out to Juris ONEIDA Dollar by phone today to offer complex care management services.  Steve [name] was given information about Complex Care Management services today including:   The Complex Care Management services include support from the care team which includes your Nurse Care Manager, Clinical Social Worker, or Pharmacist.  The Complex Care Management team is here to help remove barriers to the health concerns and goals most important to you. Complex Care Management services are voluntary, and the patient may decline or stop services at any time by request to their care team member.   Complex Care Management Consent Status: Patient agreed to services and verbal consent obtained.   Follow up plan:  Telephone appointment with complex care management team member scheduled for:  12/26/2023  Encounter Outcome:  Patient Scheduled  Jeoffrey Buffalo , RMA     [place]  [facility] Guide  Direct Dial: [phone]  Website: [email or web address]

## 2023-12-23 ENCOUNTER — Ambulatory Visit: Admitting: Nurse Practitioner

## 2023-12-26 ENCOUNTER — Telehealth: Payer: Self-pay

## 2024-01-01 ENCOUNTER — Telehealth: Payer: Self-pay

## 2024-01-02 ENCOUNTER — Telehealth

## 2024-01-05 NOTE — Patient Instructions (Signed)
 Juris ONEIDA Dollar - I am sorry I was unable to reach you today for our scheduled appointment. I work with Melvin Pao, NP and am calling to support your healthcare needs. Please contact me at [phone] at your earliest convenience. I look forward to speaking with you soon.   Thank you,  Santana Stamp BSN, CCM [place]  [facility] Population Health RN Care Manager Direct Dial: [phone]  Fax: [phone]

## 2024-01-07 ENCOUNTER — Ambulatory Visit: Admitting: Nurse Practitioner

## 2024-01-07 NOTE — Progress Notes (Deleted)
   There were no vitals taken for this visit.   Subjective:    Patient ID: Steve [name], male    DOB: July 06, 1980, 43 y.o.   MRN: [id number]  HPI: Steve [name] is a 43 y.o. male  No chief complaint on file.  DIABETES Hypoglycemic episodes:{Blank single:19197::yes,no} Polydipsia/polyuria: {Blank single:19197::yes,no} Visual disturbance: {Blank single:19197::yes,no} Chest pain: {Blank single:19197::yes,no} Paresthesias: {Blank single:19197::yes,no} Glucose Monitoring: {Blank single:19197::yes,no}  Accucheck frequency: {Blank single:19197::Not Checking,Daily,BID,TID}  Fasting glucose:  Post prandial:  Evening:  Before meals: Taking Insulin ?: {Blank single:19197::yes,no}  Long acting insulin :  Short acting insulin : Blood Pressure Monitoring: {Blank single:19197::not checking,rarely,daily,weekly,monthly,a few times a day,a few times a week,a few times a month} Retinal Examination: {Blank single:19197::Up to Date,Not up to Date} Foot Exam: {Blank single:19197::Up to Date,Not up to Date} Diabetic Education: {Blank single:19197::Completed,Not Completed} Pneumovax: {Blank single:19197::Up to Date,Not up to Date,unknown} Influenza: {Blank single:19197::Up to Date,Not up to Date,unknown} Aspirin: {Blank single:19197::yes,no}  HYPERTENSION / HYPERLIPIDEMIA Satisfied with current treatment? {Blank single:19197::yes,no} Duration of hypertension: {Blank single:19197::chronic,months,years} BP monitoring frequency: {Blank single:19197::not checking,rarely,daily,weekly,monthly,a few times a day,a few times a week,a few times a month} BP range:  BP medication side effects: {Blank single:19197::yes,no} Past BP meds: {Blank multiple:19196::none,amlodipine ,amlodipine /benazepril,atenolol,benazepril,benazepril/HCTZ,bisoprolol  (bystolic),carvedilol,chlorthalidone,clonidine,diltiazem,exforge HCT,HCTZ,irbesartan (avapro),labetalol,lisinopril,lisinopril-HCTZ,losartan (cozaar),methyldopa,nifedipine,olmesartan (benicar),olmesartan-HCTZ,quinapril,ramipril,spironalactone,tekturna,valsartan,valsartan-HCTZ,verapamil} Duration of hyperlipidemia: {Blank single:19197::chronic,months,years} Cholesterol medication side effects: {Blank single:19197::yes,no} Cholesterol supplements: {Blank multiple:19196::none,fish oil,niacin,red yeast rice} Past cholesterol medications: {Blank multiple:19196::none,atorvastain (lipitor),lovastatin (mevacor),pravastatin (pravachol),rosuvastatin  (crestor ),simvastatin (zocor),vytorin,fenofibrate (tricor),gemfibrozil,ezetimide (zetia),niaspan,lovaza} Medication compliance: {Blank single:19197::excellent compliance,good compliance,fair compliance,poor compliance} Aspirin: {Blank single:19197::yes,no} Recent stressors: {Blank single:19197::yes,no} Recurrent headaches: {Blank single:19197::yes,no} Visual changes: {Blank single:19197::yes,no} Palpitations: {Blank single:19197::yes,no} Dyspnea: {Blank single:19197::yes,no} Chest pain: {Blank single:19197::yes,no} Lower extremity edema: {Blank single:19197::yes,no} Dizzy/lightheaded: {Blank single:19197::yes,no}  Relevant past medical, surgical, family and social history reviewed and updated as indicated. Interim medical history since our last visit reviewed. Allergies and medications reviewed and updated.  Review of Systems  Per HPI unless specifically indicated above     Objective:    There were no vitals taken for this visit.  Wt Readings from Last 3 Encounters:  12/09/23 240 lb (108.9 kg)  02/27/23 222 lb (100.7 kg)  02/13/23 219 lb 9.6 oz (99.6 kg)    Physical Exam  Results for orders placed or performed in visit  on 02/26/23  Lab report - scanned   Collection Time: 01/20/23 12:00 AM  Result Value Ref Range   EGFR 120.0       Assessment & Plan:   Problem List Items Addressed This Visit   None    Follow up plan: No follow-ups on file.

## 2024-01-12 NOTE — Progress Notes (Deleted)
  Start: *** end: *** Patient is here today *** Patient would like to learn *** Patient lives with ***.  *** shopping and cooking.  History includes:  *** Medications include:  *** Labs noted:  ***  13.5%, CGM, mounjaro

## 2024-01-13 DIAGNOSIS — H2000 Unspecified acute and subacute iridocyclitis: Secondary | ICD-10-CM | POA: Diagnosis not present

## 2024-01-16 DIAGNOSIS — E113393 Type 2 diabetes mellitus with moderate nonproliferative diabetic retinopathy without macular edema, bilateral: Secondary | ICD-10-CM | POA: Diagnosis not present

## 2024-01-16 DIAGNOSIS — H20011 Primary iridocyclitis, right eye: Secondary | ICD-10-CM | POA: Diagnosis not present

## 2024-01-19 ENCOUNTER — Ambulatory Visit: Admitting: Dietician

## 2024-01-19 DIAGNOSIS — E1165 Type 2 diabetes mellitus with hyperglycemia: Secondary | ICD-10-CM

## 2024-01-19 DIAGNOSIS — E119 Type 2 diabetes mellitus without complications: Secondary | ICD-10-CM

## 2024-01-29 DIAGNOSIS — H20011 Primary iridocyclitis, right eye: Secondary | ICD-10-CM | POA: Diagnosis not present

## 2024-01-29 DIAGNOSIS — E113393 Type 2 diabetes mellitus with moderate nonproliferative diabetic retinopathy without macular edema, bilateral: Secondary | ICD-10-CM | POA: Diagnosis not present

## 2024-01-29 LAB — HM DIABETES EYE EXAM

## 2024-02-04 ENCOUNTER — Encounter: Payer: Self-pay | Admitting: Nurse Practitioner

## 2024-02-04 ENCOUNTER — Ambulatory Visit (INDEPENDENT_AMBULATORY_CARE_PROVIDER_SITE_OTHER): Admitting: Nurse Practitioner

## 2024-02-04 VITALS — BP 159/83 | HR 84 | Temp 98.7°F | Ht 73.0 in | Wt 231.0 lb

## 2024-02-04 DIAGNOSIS — E785 Hyperlipidemia, unspecified: Secondary | ICD-10-CM | POA: Diagnosis not present

## 2024-02-04 DIAGNOSIS — E1159 Type 2 diabetes mellitus with other circulatory complications: Secondary | ICD-10-CM

## 2024-02-04 DIAGNOSIS — Z7985 Long-term (current) use of injectable non-insulin antidiabetic drugs: Secondary | ICD-10-CM | POA: Diagnosis not present

## 2024-02-04 DIAGNOSIS — E119 Type 2 diabetes mellitus without complications: Secondary | ICD-10-CM | POA: Diagnosis not present

## 2024-02-04 DIAGNOSIS — I700 Atherosclerosis of aorta: Secondary | ICD-10-CM | POA: Diagnosis not present

## 2024-02-04 DIAGNOSIS — E1165 Type 2 diabetes mellitus with hyperglycemia: Secondary | ICD-10-CM

## 2024-02-04 DIAGNOSIS — E1169 Type 2 diabetes mellitus with other specified complication: Secondary | ICD-10-CM | POA: Diagnosis not present

## 2024-02-04 DIAGNOSIS — I152 Hypertension secondary to endocrine disorders: Secondary | ICD-10-CM

## 2024-02-04 LAB — MICROALBUMIN, URINE WAIVED
Creatinine, Urine Waived: 100 mg/dL (ref 10–300)
Microalb, Ur Waived: 150 mg/L — ABNORMAL HIGH (ref 0–19)
Microalb/Creat Ratio: >300 mg/g — ABNORMAL HIGH (ref <30)

## 2024-02-04 MED ORDER — TIRZEPATIDE 2.5 MG/0.5ML ~~LOC~~ SOAJ: 2.5000 mg | SUBCUTANEOUS | 0 refills | Status: DC

## 2024-02-04 MED ORDER — TIRZEPATIDE 5 MG/0.5ML ~~LOC~~ SOAJ: 5.0000 mg | SUBCUTANEOUS | 1 refills | Status: AC

## 2024-02-04 MED ORDER — ROSUVASTATIN CALCIUM 5 MG PO TABS: 5.0000 mg | ORAL_TABLET | Freq: Every day | ORAL | 1 refills | Status: AC

## 2024-02-04 MED ORDER — OLMESARTAN MEDOXOMIL 20 MG PO TABS
20.0000 mg | ORAL_TABLET | Freq: Every day | ORAL | 1 refills | Status: AC
Start: 2024-02-04 — End: ?

## 2024-02-04 MED ORDER — DEXLANSOPRAZOLE 60 MG PO CPDR: 60.0000 mg | DELAYED_RELEASE_CAPSULE | Freq: Every day | ORAL | 1 refills | Status: AC

## 2024-02-04 MED ORDER — FREESTYLE LIBRE 14 DAY SENSOR MISC
1.0000 | 3 refills | Status: DC
Start: 2024-02-04 — End: 2024-03-23

## 2024-02-04 NOTE — Assessment & Plan Note (Signed)
 Chronic. Not well controlled. Will check labs at visit today and restart Mounjaro .  Patient has had difficulty with compliance, will start with one medication to avoid overwhelming him.  Will start Olmesartan  for BP and Kidney protection.  Rosuvastatin  restarted for Cardiac protection.  [facility] exam done at [facility], foot exam done today. Follow up in 1 month.  Call sooner if concerns arise.

## 2024-02-04 NOTE — Assessment & Plan Note (Signed)
Chronic.  Controlled.  Continue with current medication regimen of Crestor 5mg.  Labs ordered today.  Return to clinic in 3 months for reevaluation.  Call sooner if concerns arise.   

## 2024-02-04 NOTE — Assessment & Plan Note (Signed)
 Chronic. Not well controlled.  Previously on amlodipine .  Given Diabetes, will start ARB- Olmesartan . Recommend checking blood pressure at home.  Labs ordered today.  Follow up in 1 month.

## 2024-02-04 NOTE — Progress Notes (Signed)
 BP (!) 159/83   Pulse 84   Temp 98.7 F (37.1 C) (Oral)   Ht 6' 1 (1.854 m)   Wt 231 lb (104.8 kg)   SpO2 96%   BMI 30.48 kg/m    Subjective:    Patient ID: Steve [name], male    DOB: 19-Jun-1980, 43 y.o.   MRN: [id number]  HPI: Steve [name] is a 43 y.o. male  Chief Complaint  Patient presents with   Diabetes   Hypertension   DIABETES Not well controlled. Hasn't been seen in 1 year.  Has not been taking any medication.  Would like to have the [employer]. He did not like Ozempic  in the past but feels like Mounjaro  was better for him.   Hypoglycemic episodes:no Polydipsia/polyuria: no Visual disturbance: no Chest pain: no Paresthesias: no Glucose Monitoring: no  Accucheck frequency: Not Checking  Fasting glucose:  Post prandial:  Evening:  Before meals: Taking Insulin ?: no  Long acting insulin :  Short acting insulin : Blood Pressure Monitoring: not checking Retinal Examination: Up to Date Foot Exam: Up to Date Diabetic Education: Not Completed Pneumovax: Up to Date Influenza: Not up to Date Aspirin: no  HYPERTENSION / HYPERLIPIDEMIA Has been out of medication for months.  Satisfied with current treatment? no Duration of hypertension: years BP monitoring frequency: not checking BP range:  BP medication side effects: no Past BP meds: amlodipine  Duration of hyperlipidemia: years Cholesterol medication side effects: no Cholesterol supplements: none Past cholesterol medications: rosuvastatin  (crestor ) Medication compliance: poor compliance Aspirin: no Recent stressors: no Recurrent headaches: yes Visual changes: no Palpitations: no Dyspnea: no Chest pain: no Lower extremity edema: no Dizzy/lightheaded: no  Relevant past medical, surgical, family and social history reviewed and updated as indicated. Interim medical history since our last visit reviewed. Allergies and medications reviewed and updated.  Review of Systems  Eyes:  Negative  for visual disturbance.  Respiratory:  Negative for chest tightness and shortness of breath.   Cardiovascular:  Negative for chest pain, palpitations and leg swelling.  Endocrine: Negative for polydipsia and polyuria.  Neurological:  Negative for dizziness, light-headedness, numbness and headaches.    Per HPI unless specifically indicated above     Objective:    BP (!) 159/83   Pulse 84   Temp 98.7 F (37.1 C) (Oral)   Ht 6' 1 (1.854 m)   Wt 231 lb (104.8 kg)   SpO2 96%   BMI 30.48 kg/m   Wt Readings from Last 3 Encounters:  02/04/24 231 lb (104.8 kg)  12/09/23 240 lb (108.9 kg)  02/27/23 222 lb (100.7 kg)    Physical Exam Vitals and nursing note reviewed.  Constitutional:      General: He is not in acute distress.    Appearance: Normal appearance. He is not ill-appearing, toxic-appearing or diaphoretic.  HENT:     Head: Normocephalic.     Right Ear: External ear normal.     Left Ear: External ear normal.     Nose: Nose normal. No congestion or rhinorrhea.     Mouth/Throat:     Mouth: Mucous membranes are moist.  Eyes:     General:        Right eye: No discharge.        Left eye: No discharge.     Extraocular Movements: Extraocular movements intact.     Conjunctiva/sclera: Conjunctivae normal.     Pupils: Pupils are equal, round, and reactive to light.  Cardiovascular:     Rate  and Rhythm: Normal rate and regular rhythm.     Heart sounds: No murmur heard. Pulmonary:     Effort: Pulmonary effort is normal. No respiratory distress.     Breath sounds: Normal breath sounds. No wheezing, rhonchi or rales.  Abdominal:     General: Abdomen is flat. Bowel sounds are normal.  Musculoskeletal:     Cervical back: Normal range of motion and neck supple.  Skin:    General: Skin is warm and dry.     Capillary Refill: Capillary refill takes less than 2 seconds.  Neurological:     General: No focal deficit present.     Mental Status: He is alert and oriented to person,  place, and time.  Psychiatric:        Mood and Affect: Mood normal.        Behavior: Behavior normal.        Thought Content: Thought content normal.        Judgment: Judgment normal.     Results for orders placed or performed in visit on 02/26/23  Lab report - scanned   Collection Time: 01/20/23 12:00 AM  Result Value Ref Range   EGFR 120.0       Assessment & Plan:   Problem List Items Addressed This Visit       Cardiovascular and Mediastinum   Hypertension associated with diabetes (HCC)   Chronic. Not well controlled.  Previously on amlodipine .  Given Diabetes, will start ARB- Olmesartan . Recommend checking blood pressure at home.  Labs ordered today.  Follow up in 1 month.      Relevant Medications   tirzepatide  (MOUNJARO ) 2.5 MG/0.5ML Pen   tirzepatide  (MOUNJARO ) 5 MG/0.5ML Pen   olmesartan  (BENICAR ) 20 MG tablet   rosuvastatin  (CRESTOR ) 5 MG tablet   Atherosclerosis of aorta (HCC)   Chronic.  Controlled.  Continue with current medication regimen of Crestor  5mg .  Labs ordered today.  Return to clinic in 3 months for reevaluation.  Call sooner if concerns arise.       Relevant Medications   olmesartan  (BENICAR ) 20 MG tablet   rosuvastatin  (CRESTOR ) 5 MG tablet     Endocrine   Type 2 diabetes mellitus with hyperglycemia, without long-term current use of insulin  (HCC)   Relevant Medications   tirzepatide  (MOUNJARO ) 2.5 MG/0.5ML Pen   tirzepatide  (MOUNJARO ) 5 MG/0.5ML Pen   Continuous Glucose Sensor (FREESTYLE LIBRE 14 DAY SENSOR) MISC   olmesartan  (BENICAR ) 20 MG tablet   rosuvastatin  (CRESTOR ) 5 MG tablet   Other Relevant Orders   Comprehensive metabolic panel with GFR   Hemoglobin A1c   Microalbumin, Urine Waived   Hyperlipidemia associated with type 2 diabetes mellitus (HCC)   Chronic.  Not currently on any medication. Will restart Rosuvastatin . Labs ordered today.  Follow up in 1 month. Call sooner if concerns arise.      Relevant Medications   tirzepatide   (MOUNJARO ) 2.5 MG/0.5ML Pen   tirzepatide  (MOUNJARO ) 5 MG/0.5ML Pen   olmesartan  (BENICAR ) 20 MG tablet   rosuvastatin  (CRESTOR ) 5 MG tablet   Other Relevant Orders   Lipid panel   Diabetes mellitus treated with injections of non-insulin  medication (HCC) - Primary   Chronic. Not well controlled. Will check labs at visit today and restart Mounjaro .  Patient has had difficulty with compliance, will start with one medication to avoid overwhelming him.  Will start Olmesartan  for BP and Kidney protection.  Rosuvastatin  restarted for Cardiac protection.  Eye exam done at [facility], foot exam done  today. Follow up in 1 month.  Call sooner if concerns arise.       Relevant Medications   tirzepatide  (MOUNJARO ) 2.5 MG/0.5ML Pen   tirzepatide  (MOUNJARO ) 5 MG/0.5ML Pen   olmesartan  (BENICAR ) 20 MG tablet   rosuvastatin  (CRESTOR ) 5 MG tablet     Follow up plan: Return in about 1 month (around 03/05/2024) for HTN, HLD, DM2 FU.

## 2024-02-04 NOTE — Assessment & Plan Note (Signed)
 Chronic.  Not currently on any medication. Will restart Rosuvastatin . Labs ordered today.  Follow up in 1 month. Call sooner if concerns arise.

## 2024-02-05 ENCOUNTER — Ambulatory Visit: Payer: Self-pay | Admitting: Nurse Practitioner

## 2024-02-05 LAB — COMPREHENSIVE METABOLIC PANEL WITH GFR
ALT: 12 IU/L (ref 0–44)
AST: 11 IU/L (ref 0–40)
Albumin: 4.2 g/dL (ref 4.1–5.1)
Alkaline Phosphatase: 104 IU/L (ref 44–121)
BUN/Creatinine Ratio: 10 (ref 9–20)
BUN: 8 mg/dL (ref 6–24)
Bilirubin Total: 0.5 mg/dL (ref 0.0–1.2)
CO2: 19 mmol/L — ABNORMAL LOW (ref 20–29)
Calcium: 9.6 mg/dL (ref 8.7–10.2)
Chloride: 95 mmol/L — ABNORMAL LOW (ref 96–106)
Creatinine, Ser: 0.8 mg/dL (ref 0.76–1.27)
Globulin, Total: 2.6 g/dL (ref 1.5–4.5)
Glucose: 297 mg/dL — ABNORMAL HIGH (ref 70–99)
Potassium: 4.1 mmol/L (ref 3.5–5.2)
Sodium: 131 mmol/L — ABNORMAL LOW (ref 134–144)
Total Protein: 6.8 g/dL (ref 6.0–8.5)
eGFR: 113 mL/min/1.73 (ref >59)

## 2024-02-05 LAB — LIPID PANEL
Chol/HDL Ratio: 12.5 ratio — ABNORMAL HIGH (ref 0.0–5.0)
Cholesterol, Total: 349 mg/dL — ABNORMAL HIGH (ref 100–199)
HDL: 28 mg/dL — ABNORMAL LOW (ref >39)
Triglycerides: 1108 mg/dL (ref 0–149)

## 2024-02-05 LAB — HEMOGLOBIN A1C
Est. average glucose Bld gHb Est-mCnc: 367 mg/dL
Hgb A1c MFr Bld: 14.4 % — ABNORMAL HIGH (ref 4.8–5.6)

## 2024-02-05 MED ORDER — FENOFIBRATE 160 MG PO TABS: 160.0000 mg | ORAL_TABLET | Freq: Every day | ORAL | 1 refills | Status: AC

## 2024-02-11 ENCOUNTER — Other Ambulatory Visit (HOSPITAL_COMMUNITY): Payer: Self-pay

## 2024-02-11 ENCOUNTER — Telehealth: Payer: Self-pay | Admitting: Pharmacy Technician

## 2024-02-11 NOTE — Telephone Encounter (Signed)
 Pharmacy Patient Advocate Encounter   Received notification from Onbase that prior authorization for FreeStyle Libre 14 Day Sensor is required/requested.   Insurance verification completed.   The patient is insured through [employer] .   Per test claim: PA required; PA submitted to above mentioned insurance via Latent Key/confirmation #/EOC [id number] Status is pending

## 2024-02-12 NOTE — Telephone Encounter (Signed)
 Pharmacy Patient Advocate Encounter  Received notification from [facility] that Prior Authorization for FreeStyle Libre 14 Day Sensor has been DENIED.  Full denial letter will be uploaded to the media tab. See denial reason below.   PA #/Case ID/Reference #: [id number]

## 2024-02-13 ENCOUNTER — Telehealth: Payer: Self-pay

## 2024-02-13 NOTE — Patient Outreach (Signed)
 Complex Care Management Care Guide Note  02/13/2024 Name: Steve [name] MRN: [id number] DOB: 06-16-1980  Steve [name] is a 43 y.o. year old male who is a primary care patient of Melvin Pao, NP and is actively engaged with the care management team. I reached out to Steve [name] by phone today to assist with re-scheduling  with the RN Case Manager.  Follow up plan: 4th Unsuccessful telephone outreach attempt made. A HIPAA compliant phone message was left for the patient providing contact information and requesting a return call. No further outreach attempts will be made.  Shereen Gin [facility]  Population Health VBCI Assistant Direct Dial: [phone]  Fax: [phone] Website: [email or web address]

## 2024-02-13 NOTE — Telephone Encounter (Signed)
 Please let patient know that his Freestyle Herlene was denied by insurance.

## 2024-03-05 ENCOUNTER — Ambulatory Visit: Admitting: Nurse Practitioner

## 2024-03-19 ENCOUNTER — Other Ambulatory Visit: Payer: Self-pay | Admitting: Nurse Practitioner

## 2024-03-19 DIAGNOSIS — E1165 Type 2 diabetes mellitus with hyperglycemia: Secondary | ICD-10-CM

## 2024-03-22 ENCOUNTER — Other Ambulatory Visit (HOSPITAL_COMMUNITY): Payer: Self-pay

## 2024-03-22 NOTE — Telephone Encounter (Signed)
 PA team- can we complete the PA please?

## 2024-03-22 NOTE — Telephone Encounter (Signed)
 Requested medication (s) are due for refill today - no  Requested medication (s) are on the active medication list -yes  Future visit scheduled -yes  Last refill: 02/04/24  Notes to clinic:   Pharmacy comment: Alternative Requested:PA REQUIRED.    Requested Prescriptions  Pending Prescriptions Disp Refills   Continuous Glucose Sensor (FREESTYLE LIBRE 3 SENSOR) MISC [Pharmacy Med Name: FREESTYLE LIBRE 3 SENSOR] 2 each 3    Sig: APPLY 1 DEVICE EVERY 14 DAYS     Endocrinology: Diabetes - Testing Supplies Passed - 03/22/2024  1:46 PM      Passed - Valid encounter within last 12 months    Recent Outpatient Visits           1 month ago Diabetes mellitus treated with injections of non-insulin  medication (HCC)   [facility] Melvin Pao, NP                 Requested Prescriptions  Pending Prescriptions Disp Refills   Continuous Glucose Sensor (FREESTYLE LIBRE 3 SENSOR) MISC [Pharmacy Med Name: FREESTYLE LIBRE 3 SENSOR] 2 each 3    Sig: APPLY 1 DEVICE EVERY 14 DAYS     Endocrinology: Diabetes - Testing Supplies Passed - 03/22/2024  1:46 PM      Passed - Valid encounter within last 12 months    Recent Outpatient Visits           1 month ago Diabetes mellitus treated with injections of non-insulin  medication [facility])   [facility] Melvin Pao, NP

## 2024-03-24 ENCOUNTER — Ambulatory Visit: Admitting: Nurse Practitioner

## 2024-03-24 NOTE — Progress Notes (Deleted)
   There were no vitals taken for this visit.   Subjective:    Patient ID: Steve [name], male    DOB: 15-May-1981, 43 y.o.   MRN: [id number]  HPI: Steve [name] is a 42 y.o. male  No chief complaint on file.  DIABETES Not well controlled. Hasn't been seen in 1 year.  Has not been taking any medication.  Would like to have the [employer]. He did not like Ozempic  in the past but feels like Mounjaro  was better for him.   Hypoglycemic episodes:no Polydipsia/polyuria: no Visual disturbance: no Chest pain: no Paresthesias: no Glucose Monitoring: no  Accucheck frequency: Not Checking  Fasting glucose:  Post prandial:  Evening:  Before meals: Taking Insulin ?: no  Long acting insulin :  Short acting insulin : Blood Pressure Monitoring: not checking Retinal Examination: Up to Date Foot Exam: Up to Date Diabetic Education: Not Completed Pneumovax: Up to Date Influenza: Not up to Date Aspirin: no  Relevant past medical, surgical, family and social history reviewed and updated as indicated. Interim medical history since our last visit reviewed. Allergies and medications reviewed and updated.  Review of Systems  Per HPI unless specifically indicated above     Objective:    There were no vitals taken for this visit.  Wt Readings from Last 3 Encounters:  02/04/24 231 lb (104.8 kg)  12/09/23 240 lb (108.9 kg)  02/27/23 222 lb (100.7 kg)    Physical Exam  Results for orders placed or performed in visit on 02/05/24  HM DIABETES EYE EXAM   Collection Time: 01/29/24  2:30 PM  Result Value Ref Range   HM Diabetic Eye Exam Retinopathy (A) No Retinopathy      Assessment & Plan:   Problem List Items Addressed This Visit       Cardiovascular and Mediastinum   Hypertension associated with diabetes (HCC)     Endocrine   Hyperlipidemia associated with type 2 diabetes mellitus (HCC)   Diabetes mellitus treated with injections of non-insulin  medication (HCC) - Primary      Follow up plan: No follow-ups on file.

## 2024-04-05 NOTE — Progress Notes (Signed)
 Steve [name]                                          MRN: [id number]   04/05/2024   The VBCI Quality Team Specialist reviewed this patient medical record for the purposes of chart review for care gap closure. The following were reviewed: chart review for care gap closure-glycemic status assessment.    VBCI Quality Team

## 2024-05-06 ENCOUNTER — Other Ambulatory Visit: Payer: Self-pay

## 2024-05-06 NOTE — Progress Notes (Unsigned)
   05/06/2024  Patient ID: Steve [name], male   DOB: [birth year], 43 y.o.   MRN: [id number]  Outreach attempt to discuss diabetes management with patient was not successful, and his voicemail is full.  Patient has not logged into MyChart since 2024, so I will not send a MyChart message.  I will send an email to email address on file asking patient to call me back on my direct line.  I will try to cal him again next week if I do not hear back.  Channing DELENA Mealing, PharmD, DPLA

## 2024-05-10 NOTE — Progress Notes (Signed)
 Steve [name]                                          MRN: [id number]   05/10/2024   The VBCI Quality Team Specialist reviewed this patient medical record for the purposes of chart review for care gap closure. The following were reviewed: chart review for care gap closure-glycemic status assessment.    VBCI Quality Team

## 2024-05-11 ENCOUNTER — Ambulatory Visit: Admitting: Nurse Practitioner

## 2024-05-11 ENCOUNTER — Other Ambulatory Visit: Payer: Self-pay

## 2024-05-11 DIAGNOSIS — E1159 Type 2 diabetes mellitus with other circulatory complications: Secondary | ICD-10-CM

## 2024-05-11 DIAGNOSIS — E1169 Type 2 diabetes mellitus with other specified complication: Secondary | ICD-10-CM

## 2024-05-11 DIAGNOSIS — E1165 Type 2 diabetes mellitus with hyperglycemia: Secondary | ICD-10-CM

## 2024-05-11 MED ORDER — MICROLET LANCETS MISC: 12 refills | Status: DC

## 2024-05-11 MED ORDER — CONTOUR PLUS TEST VI STRP: ORAL_STRIP | 12 refills | Status: DC

## 2024-05-11 MED ORDER — CONTOUR PLUS BLUE W/DEVICE KIT: 1.0000 | PACK | Freq: Every day | 0 refills | Status: DC

## 2024-05-11 NOTE — Progress Notes (Signed)
 05/11/2024 Name: Steve [name] MRN: [id number] DOB: 1981-03-07  Steve [name] is a 43 y.o. year old male who presented for a telephone visit to address management of diabetes.  Subjective:  Care Team: Primary Care Provider: Melvin Pao, NP ; Next Scheduled Visit: 05/17/2024  Medication Access/Adherence  Current Pharmacy:  [id number] - [place], [place] - [address] [place] [address] Phone: [phone] Fax: [phone]  [facility] - [place], [place] - [address] 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  He was prescribed Libre 3 for CGM, but insurance would not cover since he is not currently using insulin .  He does not have a glucometer, test strips and meter; and he is hesitant to do regular fingersticks to check blood sugar. -He completed 4 weeks at 2.5mg  weekly of Mounjaro , and increased to 5mg  weekly approximately 6 weeks ago.  He endorses tolerating this medication well with no noted adverse side effects.  Macrovascular and Microvascular Risk Reduction:  Statin? yes (rosuvastatin  5mg  daily); ACEi/ARB? yes (olmesartan  20mg  daily) Last urinary albumin/creatinine ratio:  Lab Results  Component Value Date   MICRALBCREAT >300 (H) 02/04/2024   MICRALBCREAT 30-300 (H) 11/18/2022   MICRALBCREAT <30 02/14/2022   MICRALBCREAT <30 03/15/2020   Last eye exam:  Lab  Results  Component Value Date   HMDIABEYEEXA Retinopathy (A) 01/29/2024   Last foot exam: 02/04/2024 Tobacco Use:  Tobacco Use: Medium Risk (02/04/2024)   Patient History    Smoking Tobacco Use: Former    Smokeless Tobacco Use: Never    Passive Exposure: Not on file   Hypertension: Current medications:  olmesartan  20mg  daily started approximately 3 months ago -Last OV BP reading elevated at 159/83 on 9/3  Hyperlipidemia: Current mediccations:  rosuvastatin  5mg  daily and fenofibrate  160mg  daily initiated approximately 3 months ago  Objective:  Lab Results  Component Value Date   HGBA1C 14.4 (H) 02/04/2024   Lab Results  Component Value Date   CREATININE 0.80 02/04/2024   BUN 8 02/04/2024   NA 131 (L) 02/04/2024   K 4.1 02/04/2024   CL 95 (L) 02/04/2024   CO2 19 (L) 02/04/2024   Lab Results  Component Value Date   CHOL 349 (H) 02/04/2024   HDL 28 (L) 02/04/2024   LDLCALC Comment (A) 02/04/2024   TRIG 1,108 (HH) 02/04/2024   CHOLHDL 12.5 (H) 02/04/2024   Medications Reviewed Today     Reviewed by Deanna Channing LABOR, [name] (Pharmacist) on 05/11/24 at 1007  Med List Status: <None>   Medication Order Taking? Sig Documenting Provider Last Dose Status Informant    Discontinued 05/11/24 1007 (Cost of medication)   dexlansoprazole  (DEXILANT ) 60 MG capsule [id number] Yes Take 1 capsule (60 mg total) by mouth daily. Melvin Pao, NP  Active   fenofibrate  160 MG tablet [id number] Yes Take 1 tablet (160 mg total) by mouth daily. Melvin Pao, NP  Active   olmesartan  (BENICAR ) 20 MG tablet [id number] Yes Take 1 tablet (20 mg total) by  mouth daily. Melvin Pao, NP  Active   prednisoLONE acetate (PRED FORTE) 1 % ophthalmic suspension [id number]  Place 2 drops into the right eye every 4 (four) hours. [provider]  Active   rosuvastatin  (CRESTOR ) 5 MG tablet [id number] Yes Take 1 tablet (5 mg total) by mouth daily. Melvin Pao, NP  Active     Discontinued  05/11/24 1006 (Dose change)   tirzepatide  (MOUNJARO ) 5 MG/0.5ML Pen [id number] Yes Inject 5 mg into the skin once a week. Melvin Pao, NP  Active            Assessment/Plan:   Diabetes: -Currently uncontrolled; goal A1c <7%. Cardiorenal risk reduction is opportunities for improvement.. Blood pressure is not at goal <130/80. LDL is not at goal.  -Patient is agreeable to checking home FBG at least 3x/week at this time; orders pending for testing supplies preferred by his insurance -Continue current regimen until upcoming follow-up with PCP 12/15.  I recommend follow-up A1c, CMP, and lipid panel at this time- this will reflect 3 months of current medication use.   -Consider increasing Mounjaro  to 7.5mg  weekly at upcoming visit if he is continuing to tolerate 5mg  dose well -Would not recommend SGLT2 based on previous history of Fournier's Gangrene in the past when taking Farxiga  -Could consider addition of glipizide  xl 2.5-5mg  daily if additional glucose lowering is needed until we can titrate Mounjaro  further  Hypertension: -Uncontrolled with BP goal <130/80 -Continue current regimen at this time -If BP remains consistently >130/80, consider increasing olmesartan  to 40mg  daily   Hyperlipidemia: -Uncontrolled  -Due for follow-up lipid panel; based on results, consider increasing doses for both rosuvastatin  and fenofibrate   Follow Up Plan: 1 month  Channing DELENA Mealing, PharmD, DPLA

## 2024-05-11 NOTE — Progress Notes (Unsigned)
   05/11/2024 Name: Steve [name] MRN: [id number] DOB: 09-02-1980  No chief complaint on file.   {Visit Type:26650}   Subjective:  Care Team: Primary Care Provider: Melvin Pao, NP ; Next Scheduled Visit: *** {careteamprovider:27366}  Medication Access/Adherence  Current Pharmacy:  [id number] - [place], [place] - [address] [place] [address] Phone: [phone] Fax: [phone]  [facility] - [place], [place] - [address] [place] [address] Phone: [phone] Fax: [phone]   Patient reports affordability concerns with their medications: {YES/NO:21197} Patient reports access/transportation concerns to their pharmacy: {YES/NO:21197} Patient reports adherence concerns with their medications:  {YES/NO:21197} ***   {Pharmacy S/O Choices:26420}   Objective:  Lab Results  Component Value Date   HGBA1C 14.4 (H) 02/04/2024    Lab Results  Component Value Date   CREATININE 0.80 02/04/2024   BUN 8 02/04/2024   NA 131 (L) 02/04/2024   K 4.1 02/04/2024   CL 95 (L) 02/04/2024   CO2 19 (L) 02/04/2024    Lab Results  Component Value Date   CHOL 349 (H) 02/04/2024   HDL 28 (L) 02/04/2024   LDLCALC Comment (A) 02/04/2024   TRIG 1,108 (HH) 02/04/2024   CHOLHDL 12.5 (H) 02/04/2024    Medications Reviewed Today   Medications were not reviewed in this encounter       Assessment/Plan:   {Pharmacy A/P Choices:26421}  Follow Up Plan: ***  ***

## 2024-05-17 ENCOUNTER — Ambulatory Visit: Admitting: Nurse Practitioner

## 2024-05-17 ENCOUNTER — Other Ambulatory Visit: Payer: Self-pay

## 2024-05-17 DIAGNOSIS — E1165 Type 2 diabetes mellitus with hyperglycemia: Secondary | ICD-10-CM

## 2024-05-17 MED ORDER — MICROLET LANCETS MISC: 12 refills | Status: AC

## 2024-05-17 MED ORDER — CONTOUR PLUS TEST VI STRP: ORAL_STRIP | 12 refills | Status: AC

## 2024-05-17 MED ORDER — CONTOUR PLUS BLUE W/DEVICE KIT: 1.0000 | PACK | Freq: Every day | 0 refills | Status: AC

## 2024-05-17 NOTE — Progress Notes (Deleted)
 There were no vitals taken for this visit.   Subjective:    Patient ID: Steve [name], male    DOB: 15-Oct-1980, 43 y.o.   MRN: [id number]  HPI: Steve [name] is a 43 y.o. male  No chief complaint on file.  DIABETES Not well controlled. Hasn't been seen in 1 year.  Has not been taking any medication.  Would like to have the [employer]. He did not like Ozempic  in the past but feels like Mounjaro  was better for him.   Hypoglycemic episodes:no Polydipsia/polyuria: no Visual disturbance: no Chest pain: no Paresthesias: no Glucose Monitoring: no  Accucheck frequency: Not Checking  Fasting glucose:  Post prandial:  Evening:  Before meals: Taking Insulin ?: no  Long acting insulin :  Short acting insulin : Blood Pressure Monitoring: not checking Retinal Examination: Up to Date Foot Exam: Up to Date Diabetic Education: Not Completed Pneumovax: Up to Date Influenza: Not up to Date Aspirin: no  HYPERTENSION / HYPERLIPIDEMIA Has been out of medication for months.  Satisfied with current treatment? no Duration of hypertension: years BP monitoring frequency: not checking BP range:  BP medication side effects: no Past BP meds: amlodipine  Duration of hyperlipidemia: years Cholesterol medication side effects: no Cholesterol supplements: none Past cholesterol medications: rosuvastatin  (crestor ) Medication compliance: poor compliance Aspirin: no Recent stressors: no Recurrent headaches: yes Visual changes: no Palpitations: no Dyspnea: no Chest pain: no Lower extremity edema: no Dizzy/lightheaded: no  Relevant past medical, surgical, family and social history reviewed and updated as indicated. Interim medical history since our last visit reviewed. Allergies and medications reviewed and updated.  Review of Systems  Eyes:  Negative for visual disturbance.  Respiratory:  Negative for chest tightness and shortness of breath.   Cardiovascular:  Negative for chest  pain, palpitations and leg swelling.  Endocrine: Negative for polydipsia and polyuria.  Neurological:  Negative for dizziness, light-headedness, numbness and headaches.    Per HPI unless specifically indicated above     Objective:    There were no vitals taken for this visit.  Wt Readings from Last 3 Encounters:  02/04/24 231 lb (104.8 kg)  12/09/23 240 lb (108.9 kg)  02/27/23 222 lb (100.7 kg)    Physical Exam Vitals and nursing note reviewed.  Constitutional:      General: He is not in acute distress.    Appearance: Normal appearance. He is not ill-appearing, toxic-appearing or diaphoretic.  HENT:     Head: Normocephalic.     Right Ear: External ear normal.     Left Ear: External ear normal.     Nose: Nose normal. No congestion or rhinorrhea.     Mouth/Throat:     Mouth: Mucous membranes are moist.  Eyes:     General:        Right eye: No discharge.        Left eye: No discharge.     Extraocular Movements: Extraocular movements intact.     Conjunctiva/sclera: Conjunctivae normal.     Pupils: Pupils are equal, round, and reactive to light.  Cardiovascular:     Rate and Rhythm: Normal rate and regular rhythm.     Heart sounds: No murmur heard. Pulmonary:     Effort: Pulmonary effort is normal. No respiratory distress.     Breath sounds: Normal breath sounds. No wheezing, rhonchi or rales.  Abdominal:     General: Abdomen is flat. Bowel sounds are normal.  Musculoskeletal:     Cervical back: Normal range of motion and  neck supple.  Skin:    General: Skin is warm and dry.     Capillary Refill: Capillary refill takes less than 2 seconds.  Neurological:     General: No focal deficit present.     Mental Status: He is alert and oriented to person, place, and time.  Psychiatric:        Mood and Affect: Mood normal.        Behavior: Behavior normal.        Thought Content: Thought content normal.        Judgment: Judgment normal.     Results for orders placed or  performed in visit on 02/05/24  HM DIABETES EYE EXAM   Collection Time: 01/29/24  2:30 PM  Result Value Ref Range   HM Diabetic Eye Exam Retinopathy (A) No Retinopathy      Assessment & Plan:   Problem List Items Addressed This Visit   None     Follow up plan: No follow-ups on file.

## 2024-05-19 NOTE — Progress Notes (Signed)
 DAEMON DOWTY                                          MRN: [id number]   05/19/2024   The [facility] Quality Team Specialist reviewed this patient medical record for the purposes of chart review for care gap closure. The following were reviewed: chart review for care gap closure-glycemic status assessment.    VBCI Quality Team

## 2024-05-21 ENCOUNTER — Telehealth: Payer: Self-pay

## 2024-05-21 NOTE — Telephone Encounter (Signed)
 Patient was identified as falling into the [place] Measure - Diabetes.   Patient was: Appointment already scheduled for:  06/10/24.

## 2024-06-07 NOTE — Progress Notes (Signed)
 Steve [name]                                          MRN: [id number]   06/07/2024   The [facility]BCI Quality Team Specialist reviewed this patient medical record for the purposes of chart review for care gap closure. The following were reviewed: chart review for care gap closure-glycemic status assessment.    VBCI Quality Team

## 2024-06-09 ENCOUNTER — Other Ambulatory Visit: Payer: Self-pay

## 2024-06-09 NOTE — Progress Notes (Unsigned)
" ° °  06/09/2024 Name: Steve [name] MRN: [id number] DOB: 05-19-1981  Usuccessful patient outreach attempt for scheduled telephone visit.  I was not able to leave a voicemail; and patient has not logged into MyChart since 2024, so I will not send a message to rescheduled.  I will attempt to call the patient again next week.  Steve [name], PharmD, DPLA    "

## 2024-06-28 ENCOUNTER — Other Ambulatory Visit: Payer: Self-pay

## 2024-06-28 NOTE — Progress Notes (Unsigned)
 "  06/28/2024 Name: Steve [name] MRN: [id number] DOB: 05-28-1981  Steve [name] is a 44 y.o. year old male who presented for a telephone visit to address management of diabetes.  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  He was prescribed Libre 3 for CGM, but insurance would not cover since he is not currently using insulin .  He does not have a glucometer or test strips- [facility], but he only received the lancets -He is hesitant to checking BG daily with fingersticks -He completed 4 weeks at 2.5mg  weekly of Mounjaro , and increased to 5mg  weekly approximately 3 months ago.  Endorses tolerating medication and dose well with no adverse side effects.  He does need this refilled soon.  Macrovascular and Microvascular Risk Reduction:  Statin? yes (rosuvastatin  5mg  daily); ACEi/ARB? yes (olmesartan  20mg  daily) Last urinary albumin/creatinine ratio:  Lab Results  Component Value Date   MICRALBCREAT >300 (H) 02/04/2024   MICRALBCREAT 30-300 (H) 11/18/2022   MICRALBCREAT <30 02/14/2022   MICRALBCREAT <30 03/15/2020   Last eye exam:  Lab Results  Component Value Date   HMDIABEYEEXA Retinopathy (A) 01/29/2024   Last foot exam: 02/04/2024 Tobacco Use:  Tobacco Use: Medium Risk (02/04/2024)   Patient History    Smoking Tobacco Use: Former    Smokeless Tobacco Use: Never    Passive Exposure: Not on file   Hypertension: Current medications:  olmesartan  20mg  daily started in September 2025, but this has not been refilled since a 90 day  supply was filled 9/3 -Last OV BP reading elevated at 159/83 on 9/3  Hyperlipidemia: Current mediccations:  rosuvastatin  5mg  daily and fenofibrate  160mg  daily were also started in September, but these have also not been refilled since a 90 day supply 9/3  Objective:  Lab Results  Component Value Date   HGBA1C 14.4 (H) 02/04/2024   Lab Results  Component Value Date   CREATININE 0.80 02/04/2024   BUN 8 02/04/2024   NA 131 (L) 02/04/2024   K 4.1 02/04/2024   CL 95 (L) 02/04/2024   CO2 19 (L) 02/04/2024   Lab Results  Component Value Date   CHOL 349 (H) 02/04/2024   HDL 28 (L) 02/04/2024   LDLCALC Comment (A) 02/04/2024   TRIG 1,108 (HH) 02/04/2024   CHOLHDL 12.5 (H) 02/04/2024   Assessment/Plan:   Diabetes: -Currently uncontrolled; goal A1c <7%. Cardiorenal risk reduction is opportunities for improvement.. Blood pressure is not at goal <130/80. LDL is not at goal.  -Patient is agreeable to checking home FBG at least 3x/week at this time; test claims still reflect glucometer and strips sent to pharmacy in December will go through on insurance for $0. -ContinuMounjaro  5mg  weekly- [facility]; will contact pharmacy to fill this, test strips, glucometer, olmesartan , rosuvastatin , and fenofibrate  -Patient missed follow-up visit in December and states he was expecting office to contact him to rescheduled- will coordinate with staff to get patient rescheduled with PCP -Will be due for A1c, CMP,  UACR, CBC, and lipids -Consider increasing Mounjaro  to 7.5mg  weekly at upcoming visit if he is continuing  to tolerate 5mg  dose well -Would not recommend SGLT2 based on previous history of Fournier's Gangrene in the past when taking Farxiga  -Could consider addition of glipizide  xl 2.5-5mg  daily if additional glucose lowering is needed until we can titrate Mounjaro  further  Hypertension: -Uncontrolled with BP goal <130/80 -Continue current regimen at this time -If BP  remains consistently >130/80, consider increasing olmesartan  to 40mg  daily   Hyperlipidemia: -Uncontrolled  -Based on future lipid panel results, consider increasing doses for both rosuvastatin  and fenofibrate   Follow Up Plan: 1 month  Channing DELENA Mealing, PharmD, DPLA    "

## 2024-06-28 NOTE — Progress Notes (Unsigned)
" ° °  06/28/2024  Patient ID: Steve [name], male   DOB: [birth year], 44 y.o.   MRN: [id number]  Outreach for telephone follow-up visit was successful, but patient requested to rescheduled to tomorrow afternoon at 1:30pm.  Visit has been rescheduled.  Channing DELENA Mealing, PharmD, DPLA  "

## 2024-06-29 ENCOUNTER — Telehealth: Payer: Self-pay | Admitting: Pharmacy Technician

## 2024-06-29 ENCOUNTER — Other Ambulatory Visit: Payer: Self-pay

## 2024-06-29 DIAGNOSIS — E1159 Type 2 diabetes mellitus with other circulatory complications: Secondary | ICD-10-CM

## 2024-06-29 DIAGNOSIS — E1165 Type 2 diabetes mellitus with hyperglycemia: Secondary | ICD-10-CM

## 2024-06-29 DIAGNOSIS — E1169 Type 2 diabetes mellitus with other specified complication: Secondary | ICD-10-CM

## 2024-06-29 DIAGNOSIS — I152 Hypertension secondary to endocrine disorders: Secondary | ICD-10-CM

## 2024-06-29 NOTE — Progress Notes (Signed)
" ° °  06/29/2024 Name: Steve [name] MRN: [id number] DOB: [birth year]  Patient is appearing for a follow-up visit with the population health pharmacy technician. Last engaged with the clinical pharmacist to discuss diabetes on 06/29/2024. Contacted pharmacy today to discuss medication access.   Plan from last clinical pharmacist appointment:  Diabetes: -Currently uncontrolled; goal A1c <7%. Cardiorenal risk reduction is opportunities for improvement.. Blood pressure is not at goal <130/80. LDL is not at goal.  -Patient is agreeable to checking home FBG at least 3x/week at this time; test claims still reflect glucometer and strips sent to pharmacy in December will go through on insurance for $0. -ContinuMounjaro  5mg  weekly- one [facility]; will contact pharmacy to fill this, test strips, glucometer, olmesartan , rosuvastatin , and fenofibrate  -Patient missed follow-up visit in December and states he was expecting office to contact him to rescheduled- will coordinate with staff to get patient rescheduled with PCP -Will be due for A1c, CMP,  UACR, CBC, and lipids -Consider increasing Mounjaro  to 7.5mg  weekly at upcoming visit if he is continuing to tolerate 5mg  dose well -Would not recommend SGLT2 based on previous history of Fournier's Gangrene in the past when taking Farxiga  -Could consider addition of glipizide  xl 2.5-5mg  daily if additional glucose lowering is needed until we can titrate Mounjaro  further Hypertension: -Uncontrolled with BP goal <130/80 -Continue current regimen at this time -If BP remains consistently >130/80, consider increasing olmesartan  to 40mg  daily  Hyperlipidemia: -Uncontrolled  -Based on future lipid panel results, consider increasing doses for both rosuvastatin  and fenofibrate  Follow Up Plan: 1 month(copy/paste from last note)   Medication Adherence Barriers Identified:  Access issues with any new medication or testing device: Yes Glucometer,  test strips, Mounjaro , Olmesartan , Fenofibrate , Rosuvastatin    Medication Adherence Barriers Addressed/Actions Taken:  Reviewed medication changes per plan from last clinical pharmacist note Medication Access for Glucometer, test strips, Mounjaro , Olmesartan , Fenofibrate , Rosuvastatin  Will discuss medication access concerns with pharmacist Contacted pharmacy regarding refills [facility] the aforementioned medications and supplies Per [facility], the total cost for all is $4.90 as the Mounjaro  was the only thing that had a copay.  Per [facility], these can all be picked up tomorrow 06/30/24.   Next clinical pharmacist appointment is scheduled for: 07/30/2023  Steve [name], CPhT [facility] Office: [phone] Email: [email or web address]   "

## 2024-07-21 ENCOUNTER — Ambulatory Visit: Admitting: Nurse Practitioner

## 2024-07-29 ENCOUNTER — Other Ambulatory Visit

## 2024-12-14 ENCOUNTER — Ambulatory Visit
